# Patient Record
Sex: Male | Born: 1940
Health system: Southern US, Community
[De-identification: ages and names within clinical notes are randomized; demographics above are authoritative.]

## PROBLEM LIST (undated history)

## (undated) DIAGNOSIS — I10 Essential (primary) hypertension: Secondary | ICD-10-CM

## (undated) DIAGNOSIS — K4090 Unilateral inguinal hernia, without obstruction or gangrene, not specified as recurrent: Secondary | ICD-10-CM

## (undated) DIAGNOSIS — C801 Malignant (primary) neoplasm, unspecified: Secondary | ICD-10-CM

## (undated) DIAGNOSIS — F419 Anxiety disorder, unspecified: Secondary | ICD-10-CM

## (undated) DIAGNOSIS — Z8619 Personal history of other infectious and parasitic diseases: Secondary | ICD-10-CM

## (undated) DIAGNOSIS — E785 Hyperlipidemia, unspecified: Secondary | ICD-10-CM

## (undated) HISTORY — DX: Hyperlipidemia, unspecified: E78.5

## (undated) HISTORY — PX: TONSILLECTOMY: SHX5217

## (undated) HISTORY — PX: CHOLECYSTECTOMY: SHX55

## (undated) HISTORY — PX: TONSILLECTOMY: SUR1361

## (undated) HISTORY — DX: Personal history of other infectious and parasitic diseases: Z86.19

## (undated) HISTORY — PX: CATARACT EXTRACTION, BILATERAL: SHX1313

## (undated) HISTORY — DX: Essential (primary) hypertension: I10

---

## 2010-10-03 ENCOUNTER — Ambulatory Visit (INDEPENDENT_AMBULATORY_CARE_PROVIDER_SITE_OTHER): Payer: Medicare Other | Admitting: Internal Medicine

## 2010-10-03 ENCOUNTER — Encounter: Payer: Self-pay | Admitting: Internal Medicine

## 2010-10-03 DIAGNOSIS — I10 Essential (primary) hypertension: Secondary | ICD-10-CM

## 2010-10-03 DIAGNOSIS — E7800 Pure hypercholesterolemia, unspecified: Secondary | ICD-10-CM

## 2010-10-03 MED ORDER — ALPRAZOLAM 0.5 MG PO TABS: 0.5000 mg | ORAL_TABLET | Freq: Every evening | ORAL | Status: DC | PRN

## 2010-10-03 MED ORDER — SILDENAFIL CITRATE 100 MG PO TABS: 100.0000 mg | ORAL_TABLET | Freq: Every day | ORAL | Status: DC | PRN

## 2010-10-03 MED ORDER — LISINOPRIL 20 MG PO TABS: 20.0000 mg | ORAL_TABLET | Freq: Every day | ORAL | Status: DC

## 2010-10-03 MED ORDER — SIMVASTATIN 20 MG PO TABS: 20.0000 mg | ORAL_TABLET | Freq: Every evening | ORAL | Status: DC

## 2010-10-03 MED ORDER — AMOXICILLIN-POT CLAVULANATE 875-125 MG PO TABS: 1.0000 | ORAL_TABLET | Freq: Two times a day (BID) | ORAL | Status: DC

## 2010-10-03 MED ORDER — ATENOLOL 50 MG PO TABS: 50.0000 mg | ORAL_TABLET | Freq: Every day | ORAL | Status: DC

## 2010-10-03 NOTE — Progress Notes (Signed)
Subjective:    Patient ID: Randall Mcgee, male    DOB: 14-May-1941, 70 y.o.   MRN: [id number]  HPI   70 year old patient who is seen today to establish with our practice. He has relocated from the area and his wife is a patient here he is retired from [employer]. He has an approximate twenty-year history of hypertension and has been on Lipitor for approximately one and one half years. He was evaluated in the fall for hematuria with a normal urological workup. His PSA in August of last year was 1.7. No concerns or complaints today.   for approximately 2 months he has had a lesion involving his right anterior chest wall. This began after a trip to [place]. Maisie Fus. It has not responded to topical antibiotic creams   Review of Systems  Constitutional: Negative for fever, chills, activity change, appetite change and fatigue.  HENT: Negative for hearing loss, ear pain, congestion, rhinorrhea, sneezing, mouth sores, trouble swallowing, neck pain, neck stiffness, dental problem, voice change, sinus pressure and tinnitus.   Eyes: Negative for photophobia, pain, redness and visual disturbance.  Respiratory: Negative for apnea, cough, choking, chest tightness, shortness of breath and wheezing.   Cardiovascular: Negative for chest pain, palpitations and leg swelling.  Gastrointestinal: Negative for nausea, vomiting, abdominal pain, diarrhea, constipation, blood in stool, abdominal distention, anal bleeding and rectal pain.  Genitourinary: Negative for dysuria, urgency, frequency, hematuria, flank pain, decreased urine volume, discharge, penile swelling, scrotal swelling, difficulty urinating, genital sores and testicular pain.  Musculoskeletal: Negative for myalgias, back pain, joint swelling, arthralgias and gait problem.  Skin: Positive for wound. Negative for color change and rash.  Neurological: Negative for dizziness, tremors, seizures, syncope, facial asymmetry, speech difficulty, weakness, light-headedness,  numbness and headaches.  Hematological: Negative for adenopathy. Does not bruise/bleed easily.  Psychiatric/Behavioral: Negative for suicidal ideas, hallucinations, behavioral problems, confusion, sleep disturbance, self-injury, dysphoric mood, decreased concentration and agitation. The patient is not nervous/anxious.        Objective:   Physical Exam  Constitutional: He appears well-developed and well-nourished.  HENT:  Head: Normocephalic and atraumatic.  Right Ear: External ear normal.  Left Ear: External ear normal.  Nose: Nose normal.  Mouth/Throat: Oropharynx is clear and moist.  Eyes: Conjunctivae and EOM are normal. Pupils are equal, round, and reactive to light. No scleral icterus.  Neck: Normal range of motion. Neck supple. No JVD present. No thyromegaly present.  Cardiovascular: Regular rhythm, normal heart sounds and intact distal pulses.  Exam reveals no gallop and no friction rub.   No murmur heard. Pulmonary/Chest: Effort normal and breath sounds normal. He exhibits no tenderness.  Abdominal: Soft. Bowel sounds are normal. He exhibits no distension and no mass. There is no tenderness.  Genitourinary: Penis normal.        uncircumcised  Musculoskeletal: Normal range of motion. He exhibits no edema and no tenderness.  Lymphadenopathy:    He has no cervical adenopathy.  Neurological: He is alert. He has normal reflexes. No cranial nerve deficit. Coordination normal.  Skin: Skin is warm and dry. Rash noted.        Patient has an approximate 2 cm circular raised plaque with central ulceration.  Psychiatric: He has a normal mood and affect. His behavior is normal.          Assessment & Plan:   unremarkable health maintenance examination  ulcerated lesion right anterior chest. We'll treat with Augmentin and reassess in 3 weeks. If unimproved will refer to dermatology or possible  general surgery to exclude a basal or squamous cell skin cancer. Short duration and exposure  history, suggest this is infectious

## 2010-10-03 NOTE — Patient Instructions (Signed)
Return in one month for follow-up  Call or return to clinic prn if these symptoms worsen or fail to improve as anticipated.    It is important that you exercise regularly, at least 20 minutes 3 to 4 times per week.  If you develop chest pain or shortness of breath seek  medical attention.  Limit your sodium (Salt) intake

## 2010-10-10 ENCOUNTER — Other Ambulatory Visit: Payer: Self-pay | Admitting: Internal Medicine

## 2010-10-10 NOTE — Telephone Encounter (Signed)
Pt called this AM requesting 2nd round of ABX - feel he is improving , but not quite over sx.   We have rev'c request from pharmacy for the same

## 2010-10-10 NOTE — Telephone Encounter (Signed)
Attempted to call pt to let him know rx has been sent to pharmacy. Wrong? Number in chart. KIK

## 2010-10-10 NOTE — Telephone Encounter (Signed)
Okay to refill #20 to take twice a day with meals

## 2010-10-25 ENCOUNTER — Encounter: Payer: Self-pay | Admitting: Internal Medicine

## 2010-10-27 ENCOUNTER — Encounter: Payer: Self-pay | Admitting: Internal Medicine

## 2010-11-07 ENCOUNTER — Encounter: Payer: Self-pay | Admitting: Internal Medicine

## 2010-11-07 ENCOUNTER — Ambulatory Visit (INDEPENDENT_AMBULATORY_CARE_PROVIDER_SITE_OTHER): Payer: Medicare Other | Admitting: Internal Medicine

## 2010-11-07 DIAGNOSIS — B999 Unspecified infectious disease: Secondary | ICD-10-CM

## 2010-11-07 DIAGNOSIS — E7800 Pure hypercholesterolemia, unspecified: Secondary | ICD-10-CM

## 2010-11-07 DIAGNOSIS — L089 Local infection of the skin and subcutaneous tissue, unspecified: Secondary | ICD-10-CM

## 2010-11-07 DIAGNOSIS — I10 Essential (primary) hypertension: Secondary | ICD-10-CM

## 2010-11-07 NOTE — Patient Instructions (Signed)
Limit your sodium (Salt) intake    It is important that you exercise regularly, at least 20 minutes 3 to 4 times per week.  If you develop chest pain or shortness of breath seek  medical attention.  Please check your blood pressure on a regular basis.  If it is consistently greater than 150/90, please make an office appointment.  Return in 6 months for follow-up   

## 2010-11-07 NOTE — Progress Notes (Signed)
  Subjective:    Patient ID: Randall Mcgee, male    DOB: 04/23/1941, 70 y.o.   MRN: [id number]  HPI  70 year old patient who is seen today for followup. He established last month with hypertension and dyslipidemia. He also had an ulcerative lesion involving his right anterior chest that was felt to be inflammatory. He was placed on antibiotic therapy and is seen today in followup. The lesion has responded nicely to antibiotic therapy   Review of Systems  Skin: Positive for wound.       Objective:   Physical Exam  Constitutional: He appears well-developed and well-nourished. No distress.       122/82  Skin:       Inflammatory ulcerative nodule has largely resolved          Assessment & Plan:  Soft tissue infection largely resolved Hypertension stable  We'll recheck in 6 months

## 2011-01-03 ENCOUNTER — Other Ambulatory Visit: Payer: Self-pay

## 2011-01-03 MED ORDER — INDOMETHACIN 25 MG PO CAPS: 25.0000 mg | ORAL_CAPSULE | Freq: Three times a day (TID) | ORAL | Status: DC | PRN

## 2011-01-03 NOTE — Telephone Encounter (Signed)
Faxed to [facility]

## 2011-01-05 ENCOUNTER — Other Ambulatory Visit: Payer: Self-pay

## 2011-01-05 MED ORDER — ALPRAZOLAM 0.5 MG PO TABS: 0.5000 mg | ORAL_TABLET | Freq: Every evening | ORAL | Status: DC | PRN

## 2011-01-05 NOTE — Telephone Encounter (Signed)
Faxed back to [facility]

## 2011-04-11 ENCOUNTER — Other Ambulatory Visit: Payer: Self-pay

## 2011-04-11 MED ORDER — ALPRAZOLAM 0.5 MG PO TABS: 0.5000 mg | ORAL_TABLET | Freq: Every evening | ORAL | Status: DC | PRN

## 2011-04-11 NOTE — Telephone Encounter (Signed)
Faxed back to [facility]

## 2011-05-02 ENCOUNTER — Other Ambulatory Visit (INDEPENDENT_AMBULATORY_CARE_PROVIDER_SITE_OTHER): Payer: Medicare Other

## 2011-05-02 DIAGNOSIS — I10 Essential (primary) hypertension: Secondary | ICD-10-CM

## 2011-05-02 DIAGNOSIS — E7800 Pure hypercholesterolemia, unspecified: Secondary | ICD-10-CM

## 2011-05-02 DIAGNOSIS — T887XXA Unspecified adverse effect of drug or medicament, initial encounter: Secondary | ICD-10-CM

## 2011-05-02 DIAGNOSIS — Z0000 Encounter for general adult medical examination without abnormal findings: Secondary | ICD-10-CM

## 2011-05-02 DIAGNOSIS — Z125 Encounter for screening for malignant neoplasm of prostate: Secondary | ICD-10-CM

## 2011-05-02 LAB — CBC WITH DIFFERENTIAL/PLATELET
Basophils Absolute: 0 10*3/uL (ref 0.0–0.1)
Eosinophils Absolute: 0.1 10*3/uL (ref 0.0–0.7)
Hemoglobin: 13.7 g/dL (ref 13.0–17.0)
Lymphocytes Relative: 38.8 % (ref 12.0–46.0)
MCHC: 33.2 g/dL (ref 30.0–36.0)
Neutro Abs: 3.3 10*3/uL (ref 1.4–7.7)
Neutrophils Relative %: 48.3 % (ref 43.0–77.0)
Platelets: 199 10*3/uL (ref 150.0–400.0)
RDW: 13.6 % (ref 11.5–14.6)

## 2011-05-02 LAB — POCT URINALYSIS DIPSTICK
Ketones, UA: NEGATIVE
Leukocytes, UA: NEGATIVE
Protein, UA: NEGATIVE
Spec Grav, UA: 1.02
pH, UA: 5

## 2011-05-02 LAB — BASIC METABOLIC PANEL
BUN: 19 mg/dL (ref 6–23)
CO2: 29 mEq/L (ref 19–32)
Calcium: 9.4 mg/dL (ref 8.4–10.5)
Creatinine, Ser: 0.9 mg/dL (ref 0.4–1.5)
GFR: 92.75 mL/min (ref >60.00)
Glucose, Bld: 102 mg/dL — ABNORMAL HIGH (ref 70–99)

## 2011-05-02 LAB — TSH: TSH: 3.54 u[IU]/mL (ref 0.35–5.50)

## 2011-05-02 LAB — LIPID PANEL
HDL: 47.4 mg/dL (ref >39.00)
LDL Cholesterol: 133 mg/dL — ABNORMAL HIGH (ref 0–99)
VLDL: 15.2 mg/dL (ref 0.0–40.0)

## 2011-05-02 LAB — PSA: PSA: 1.95 ng/mL (ref 0.10–4.00)

## 2011-05-02 LAB — HEPATIC FUNCTION PANEL
Albumin: 4.2 g/dL (ref 3.5–5.2)
Alkaline Phosphatase: 38 U/L — ABNORMAL LOW (ref 39–117)
Total Bilirubin: 0.7 mg/dL (ref 0.3–1.2)

## 2011-05-09 ENCOUNTER — Encounter: Payer: Self-pay | Admitting: Internal Medicine

## 2011-05-09 ENCOUNTER — Ambulatory Visit (INDEPENDENT_AMBULATORY_CARE_PROVIDER_SITE_OTHER): Payer: Medicare Other | Admitting: Internal Medicine

## 2011-05-09 DIAGNOSIS — I10 Essential (primary) hypertension: Secondary | ICD-10-CM

## 2011-05-09 DIAGNOSIS — Z23 Encounter for immunization: Secondary | ICD-10-CM

## 2011-05-09 DIAGNOSIS — D239 Other benign neoplasm of skin, unspecified: Secondary | ICD-10-CM

## 2011-05-09 DIAGNOSIS — R0989 Other specified symptoms and signs involving the circulatory and respiratory systems: Secondary | ICD-10-CM

## 2011-05-09 DIAGNOSIS — Z0000 Encounter for general adult medical examination without abnormal findings: Secondary | ICD-10-CM

## 2011-05-09 MED ORDER — SIMVASTATIN 40 MG PO TABS: 40.0000 mg | ORAL_TABLET | Freq: Every evening | ORAL | Status: DC

## 2011-05-09 NOTE — Progress Notes (Signed)
Subjective:    Patient ID: Randall Mcgee, male    DOB: Nov 28, 1940, 70 y.o.   MRN: [id number]  HPI  70 year old patient who is seen today for a preventive health examination. He established with our practice earlier this year. Medical problems include hypertension and dyslipidemia. He is doing quite well without concerns or complaints  Past Medical History  Diagnosis Date  . Hyperlipidemia   . Hypertension   . History of chicken pox    Past Surgical History  Procedure Date  . Cholecystectomy   . Tonsillectomy     reports that he has never smoked. He has never used smokeless tobacco. He reports that he does not drink alcohol or use illicit drugs. family history is not on file. No Known Allergies  1. Risk factors, based on past  M,S,F history-  cardiovascular risk factors include hypertension and dyslipidemia  2.  Physical activities: Remains active with gardening and housework although no regular exercise program. No exercise limitation  3.  Depression/mood: History mild anxiety but no history of severe depression or mood disorder  4.  Hearing: No deficits  5.  ADL's: Independent in all aspects of daily living  6.  Fall risk: Low  7.  Home safety: No problems identified  8.  Height weight, and visual acuity; height and weight stable no change in visual acuity 9.  Counseling: More regular exercise heart healthy diet encouraged  10. Lab orders based on risk factors: Laboratory studies reviewed we'll titrate statin therapy  11. Referral  dermatology referral. Maryclare Labrador also set up carotid artery Doppler studies :  12. Care plan: Carotid artery Doppler study rule out significant carotid artery disease 13. Cognitive assessment: Alert and oriented normal affect. No cognitive dysfunction.      Review of Systems  Constitutional: Negative for fever, chills, activity change, appetite change and fatigue.  HENT: Negative for hearing loss, ear pain, congestion, rhinorrhea, sneezing,  mouth sores, trouble swallowing, neck pain, neck stiffness, dental problem, voice change, sinus pressure and tinnitus.   Eyes: Negative for photophobia, pain, redness and visual disturbance.  Respiratory: Negative for apnea, cough, choking, chest tightness, shortness of breath and wheezing.   Cardiovascular: Negative for chest pain, palpitations and leg swelling.  Gastrointestinal: Negative for nausea, vomiting, abdominal pain, diarrhea, constipation, blood in stool, abdominal distention, anal bleeding and rectal pain.  Genitourinary: Negative for dysuria, urgency, frequency, hematuria, flank pain, decreased urine volume, discharge, penile swelling, scrotal swelling, difficulty urinating, genital sores and testicular pain.  Musculoskeletal: Negative for myalgias, back pain, joint swelling, arthralgias and gait problem.  Skin: Negative for color change, rash and wound.  Neurological: Negative for dizziness, tremors, seizures, syncope, facial asymmetry, speech difficulty, weakness, light-headedness, numbness and headaches.  Hematological: Negative for adenopathy. Does not bruise/bleed easily.  Psychiatric/Behavioral: Negative for suicidal ideas, hallucinations, behavioral problems, confusion, sleep disturbance, self-injury, dysphoric mood, decreased concentration and agitation. The patient is not nervous/anxious.        Objective:   Physical Exam  Constitutional: He appears well-developed and well-nourished.  HENT:  Head: Normocephalic and atraumatic.  Right Ear: External ear normal.  Left Ear: External ear normal.  Nose: Nose normal.  Mouth/Throat: Oropharynx is clear and moist.  Eyes: Conjunctivae and EOM are normal. Pupils are equal, round, and reactive to light. No scleral icterus.  Neck: Normal range of motion. Neck supple. No JVD present. No thyromegaly present.       Bilateral carotid bruits right greater than the left  Cardiovascular: Regular rhythm, normal heart sounds  and intact  distal pulses.  Exam reveals no gallop and no friction rub.   No murmur heard. Pulmonary/Chest: Effort normal and breath sounds normal. He exhibits no tenderness.  Abdominal: Soft. Bowel sounds are normal. He exhibits no distension and no mass. There is no tenderness.  Genitourinary: Prostate normal and penis normal.  Musculoskeletal: Normal range of motion. He exhibits no edema and no tenderness.  Lymphadenopathy:    He has no cervical adenopathy.  Neurological: He is alert. He has normal reflexes. No cranial nerve deficit. Coordination normal.  Skin: Skin is warm and dry. No rash noted.       A. pigmented nodule 5 x 7 mm was present involving his right upper back region. A slightly smaller 4 x 5 mm pigmented macule also noted in the same vicinity  Psychiatric: He has a normal mood and affect. His behavior is normal.          Assessment & Plan:   Hypertension. Well controlled we'll continue his present regimen Preventive health Dyslipidemia Will increase simvastatin to 40 mg daily Dysplastic nevus we'll set up for dermatology evaluation Carotid bruits. The carotid artery Doppler study

## 2011-05-09 NOTE — Progress Notes (Signed)
  Subjective:    Patient ID: Randall Mcgee, male    DOB: 04-17-1941, 70 y.o.   MRN: [id number]  HPI  Past Medical History  Diagnosis Date  . Hyperlipidemia   . Hypertension   . History of chicken pox    Past Surgical History  Procedure Date  . Cholecystectomy   . Tonsillectomy     reports that he has never smoked. He has never used smokeless tobacco. He reports that he uses illicit drugs (Other-see comments). His alcohol history not on file. family history is not on file. No Known Allergies     Review of Systems     Objective:   Physical Exam        Assessment & Plan:

## 2011-05-09 NOTE — Patient Instructions (Signed)
Limit your sodium (Salt) intake    It is important that you exercise regularly, at least 20 minutes 3 to 4 times per week.  If you develop chest pain or shortness of breath seek  medical attention.  Return in 6 months for follow-up  

## 2011-05-22 ENCOUNTER — Encounter (INDEPENDENT_AMBULATORY_CARE_PROVIDER_SITE_OTHER): Payer: Medicare Other | Admitting: *Deleted

## 2011-05-22 ENCOUNTER — Other Ambulatory Visit: Payer: Self-pay | Admitting: Internal Medicine

## 2011-05-22 DIAGNOSIS — I6529 Occlusion and stenosis of unspecified carotid artery: Secondary | ICD-10-CM

## 2011-05-22 DIAGNOSIS — R0989 Other specified symptoms and signs involving the circulatory and respiratory systems: Secondary | ICD-10-CM

## 2011-05-24 ENCOUNTER — Encounter: Payer: Self-pay | Admitting: Vascular Surgery

## 2011-05-25 ENCOUNTER — Other Ambulatory Visit (INDEPENDENT_AMBULATORY_CARE_PROVIDER_SITE_OTHER): Payer: Medicare Other | Admitting: *Deleted

## 2011-05-25 ENCOUNTER — Ambulatory Visit (INDEPENDENT_AMBULATORY_CARE_PROVIDER_SITE_OTHER): Payer: Medicare Other | Admitting: Vascular Surgery

## 2011-05-25 ENCOUNTER — Encounter: Payer: Self-pay | Admitting: Vascular Surgery

## 2011-05-25 VITALS — BP 176/92 | HR 58 | Ht 71.0 in | Wt 198.0 lb

## 2011-05-25 DIAGNOSIS — I6529 Occlusion and stenosis of unspecified carotid artery: Secondary | ICD-10-CM

## 2011-05-25 DIAGNOSIS — Z01818 Encounter for other preprocedural examination: Secondary | ICD-10-CM

## 2011-05-25 NOTE — Progress Notes (Signed)
History of Present Illness:  Patient is a 70 y.o. year old male who presents for evaluation of carotid stenosis.  The patient denies symptoms of TIA, amaurosis, or stroke.  The patient is currently on aspirin antiplatelet therapy.  The carotid stenosis was found on duplex evaluation.  Other medical problems include hyperlipidemia and hypertension.  He is a remote smoker.  He denies dyspnea, chest pain, or claudication.  His medical problems are currently stable and followed by Dr Amador Cunas.  Past Medical History  Diagnosis Date  . Hyperlipidemia   . Hypertension   . History of chicken pox     Past Surgical History  Procedure Date  . Cholecystectomy   . Tonsillectomy      Social History History  Substance Use Topics  . Smoking status: Never Smoker   . Smokeless tobacco: Never Used  . Alcohol Use: No    Family History Family History  Problem Relation Age of Onset  . Heart disease Mother     Allergies  No Known Allergies   Current Outpatient Prescriptions  Medication Sig Dispense Refill  . ALPRAZolam (XANAX) 0.5 MG tablet Take 1 tablet (0.5 mg total) by mouth at bedtime as needed.  30 tablet  2  . aspirin 325 MG tablet Take 325 mg by mouth daily.        Marland Kitchen atenolol (TENORMIN) 50 MG tablet Take 1 tablet (50 mg total) by mouth daily.  90 tablet  6  . GINSENG PO Take 450 mg by mouth daily.        . indomethacin (INDOCIN) 25 MG capsule Take 1 capsule (25 mg total) by mouth 3 (three) times daily as needed.  90 capsule  0  . lisinopril (PRINIVIL,ZESTRIL) 20 MG tablet Take 1 tablet (20 mg total) by mouth daily.  90 tablet  6  . Multiple Vitamins-Minerals (CENTRUM SILVER PO) Take by mouth daily.        . Omega-3 Fatty Acids (FISH OIL) 1200 MG CAPS Take by mouth daily.        . sildenafil (VIAGRA) 100 MG tablet Take 1 tablet (100 mg total) by mouth daily as needed.  10 tablet  6  . simvastatin (ZOCOR) 40 MG tablet Take 1 tablet (40 mg total) by mouth every evening.  90 tablet  6    . St Johns Wort 300 MG CAPS Take by mouth daily.          ROS:   General:  No weight loss, Fever, chills  HEENT: No recent headaches, no nasal bleeding, no visual changes, no sore throat  Neurologic: No dizziness, blackouts, seizures. No recent symptoms of stroke or mini- stroke. No recent episodes of slurred speech, or temporary blindness.  Cardiac: No recent episodes of chest pain/pressure, no shortness of breath at rest.  No shortness of breath with exertion.  Denies history of atrial fibrillation or irregular heartbeat  Vascular: No history of rest pain in feet.  No history of claudication.  No history of non-healing ulcer, No history of DVT   Pulmonary: No home oxygen, no productive cough, no hemoptysis,  No asthma or wheezing  Musculoskeletal:  [ ]  Arthritis, [ ]  Low back pain,  [ ]  Joint pain  Hematologic:No history of hypercoagulable state.  No history of easy bleeding.  No history of anemia  Gastrointestinal: No hematochezia or melena,  No gastroesophageal reflux, no trouble swallowing  Urinary: [ ]  chronic Kidney disease, [ ]  on HD - [ ]  MWF or [ ]  TTHS, [ ]   Burning with urination, [ ]  Frequent urination, [ ]  Difficulty urinating;   Skin: No rashes  Psychological: No history of anxiety,  No history of depression   Physical Examination  Filed Vitals:   05/25/11 0940 05/25/11 0941  BP: 173/100 176/92  Pulse: 58 58  Height: 5\' 11"  (1.803 m)   Weight: 198 lb (89.812 kg)   SpO2: 98%     Body mass index is 27.62 kg/(m^2).  General:  Alert and oriented, no acute distress HEENT: Normal Neck: No bruit or JVD Pulmonary: Clear to auscultation bilaterally Cardiac: Regular Rate and Rhythm without murmur Gastrointestinal: Soft, non-tender, non-distended, no mass, no scars Skin: No rash Extremity Pulses:  2+ radial, brachial, femoral, dorsalis pedis, posterior tibial pulses bilaterally Musculoskeletal: No deformity or edema  Neurologic: Upper and lower extremity motor  5/5 and symmetric  DATA: Carotid Duplex from [facility] was reviewed which suggested a high grade subtotal occlusion right ICA.  The right side was repeated today to make sure this was not an occlusion and also to determine distal extent of the plaque. I reviewed and interpreted his carotid duplex scan from our office today which shows a greater than 80% right internal carotid artery stenosis with peak systolic velocity of 430 cm/s and end-diastolic velocity of 142 cm/s, there was normal vessel beyond the stenosis   ASSESSMENT: High-grade right internal carotid artery stenosis, asymptomatic. Risks benefits possible complications and procedure details of right carotid endarterectomy were explained to the patient and his wife today. These were including but not limited to bleeding infection stroke risk of 1-2%, cranial nerve injury 10-15%, myocardial infarction 5% or less.   PLAN: We will schedule the patient for cardiac risk stratification a stress test at [facility] cardiology as soon as possible.  We will proceed with right carotid endarterectomy after the patient stress test.  Fabienne Bruns, MD [facility] Office: [phone] Pager: [phone]

## 2011-05-31 ENCOUNTER — Ambulatory Visit (HOSPITAL_COMMUNITY): Payer: Medicare Other | Attending: Vascular Surgery | Admitting: Radiology

## 2011-05-31 DIAGNOSIS — R002 Palpitations: Secondary | ICD-10-CM | POA: Insufficient documentation

## 2011-05-31 DIAGNOSIS — I6529 Occlusion and stenosis of unspecified carotid artery: Secondary | ICD-10-CM

## 2011-05-31 DIAGNOSIS — Z01818 Encounter for other preprocedural examination: Secondary | ICD-10-CM

## 2011-05-31 DIAGNOSIS — I779 Disorder of arteries and arterioles, unspecified: Secondary | ICD-10-CM | POA: Insufficient documentation

## 2011-05-31 DIAGNOSIS — Z01810 Encounter for preprocedural cardiovascular examination: Secondary | ICD-10-CM | POA: Insufficient documentation

## 2011-05-31 DIAGNOSIS — Z8249 Family history of ischemic heart disease and other diseases of the circulatory system: Secondary | ICD-10-CM | POA: Insufficient documentation

## 2011-05-31 DIAGNOSIS — I10 Essential (primary) hypertension: Secondary | ICD-10-CM | POA: Insufficient documentation

## 2011-05-31 DIAGNOSIS — E7800 Pure hypercholesterolemia, unspecified: Secondary | ICD-10-CM

## 2011-05-31 DIAGNOSIS — Z87891 Personal history of nicotine dependence: Secondary | ICD-10-CM | POA: Insufficient documentation

## 2011-05-31 DIAGNOSIS — E785 Hyperlipidemia, unspecified: Secondary | ICD-10-CM | POA: Insufficient documentation

## 2011-05-31 DIAGNOSIS — I4949 Other premature depolarization: Secondary | ICD-10-CM

## 2011-05-31 MED ORDER — REGADENOSON 0.4 MG/5ML IV SOLN
0.4000 mg | Freq: Once | INTRAVENOUS | Status: AC
  Administered 2011-05-31: 0.4 mg via INTRAVENOUS

## 2011-05-31 MED ORDER — TECHNETIUM TC 99M TETROFOSMIN IV KIT
11.0000 | PACK | Freq: Once | INTRAVENOUS | Status: AC | PRN
  Administered 2011-05-31: 11 via INTRAVENOUS

## 2011-05-31 MED ORDER — TECHNETIUM TC 99M TETROFOSMIN IV KIT
33.0000 | PACK | Freq: Once | INTRAVENOUS | Status: AC | PRN
  Administered 2011-05-31: 33 via INTRAVENOUS

## 2011-05-31 NOTE — Progress Notes (Signed)
[facility] SITE 3 NUCLEAR MED [address] [place] [address] [phone]  Cardiology Nuclear Med Study  Randall Mcgee is a 70 y.o. male [id number] 03-24-1941   Nuclear Med Background Indication for Stress Test:  Evaluation for Ischemia and Surgical Clearance Pre-op R CEA, Dr. Darrick Penna History:  GXT: done in [place] >51yrs ago Cardiac Risk Factors: Carotid Disease (L- 40-59%,  R->80%), Family History - CAD, History of Smoking, Hypertension and Lipids  Symptoms:  Palpitations   Nuclear Pre-Procedure Caffeine/Decaff Intake:  None NPO After: 7:30am   Lungs:  clear IV 0.9% NS with Angio Cath:  20g  IV Site: R Hand  IV Started by:  Bonnita Levan, RN  Chest Size (in):  44 Cup Size: n/a  Height: 5\' 11"  (1.803 m)  Weight:  199 lb (90.266 kg)  BMI:  Body mass index is 27.75 kg/(m^2). Tech Comments:  No attenolol this am    Nuclear Med Study 1 or 2 day study: 1 day  Stress Test Type:  Lexiscan  Reading MD: Charlton Haws, MD  Order Authorizing Provider:  Dr. Fabienne Bruns  Resting Radionuclide: Technetium 64m Tetrofosmin  Resting Radionuclide Dose: 11.0 mCi   Stress Radionuclide:  Technetium 60m Tetrofosmin  Stress Radionuclide Dose: 33.0 mCi           Stress Protocol Rest HR: 60 Stress HR: 80  Rest BP: 144/90 Stress BP: 145/93  Exercise Time (min): n/a METS: n/a   Predicted Max HR: 148 bpm % Max HR: 54.05 bpm Rate Pressure Product: [id number]   Dose of Adenosine (mg):  n/a Dose of Lexiscan: 0.4 mg  Dose of Atropine (mg): n/a Dose of Dobutamine: n/a mcg/kg/min (at max HR)  Stress Test Technologist: Milana Na, EMT-P  Nuclear Technologist:  Domenic Polite, CNMT     Rest Procedure:  Myocardial perfusion imaging was performed at rest 45 minutes following the intravenous administration of Technetium 62m Tetrofosmin. Rest ECG: NSR  Stress Procedure:  The patient received IV Lexiscan 0.4 mg over 15-seconds.  Technetium 46m Tetrofosmin injected at 30-seconds.   There were no significant changes, + Lt. Headed, and rare pvcs with Lexiscan.  Quantitative spect images were obtained after a 45 minute delay. Stress ECG: No significant change from baseline ECG  QPS Raw Data Images:  Normal; no motion artifact; normal heart/lung ratio. Stress Images:  Normal homogeneous uptake in all areas of the myocardium. Rest Images:  Normal homogeneous uptake in all areas of the myocardium. Subtraction (SDS):  Normal Transient Ischemic Dilatation (Normal <1.22):  0.98 Lung/Heart Ratio (Normal <0.45):  0.27  Quantitative Gated Spect Images QGS EDV:  103 ml QGS ESV:  40 ml QGS cine images:  NL LV Function; NL Wall Motion QGS EF: 61%  Impression Exercise Capacity:  Lexiscan with no exercise. BP Response:  Normal blood pressure response. Clinical Symptoms:  No chest pain. ECG Impression:  No significant ST segment change suggestive of ischemia. Comparison with Prior Nuclear Study: No images to compare  Overall Impression:  Normal stress nuclear study.     Charlton Haws

## 2011-06-05 ENCOUNTER — Telehealth: Payer: Self-pay | Admitting: Internal Medicine

## 2011-06-05 NOTE — Telephone Encounter (Signed)
Pt. Is wanting advice on an up coming surgery with Dr. Darrick Penna on his carotid artery and is requesting to be contacted.

## 2011-06-05 NOTE — Telephone Encounter (Signed)
Called and discussd

## 2011-06-06 ENCOUNTER — Other Ambulatory Visit: Payer: Self-pay

## 2011-06-13 ENCOUNTER — Encounter (HOSPITAL_COMMUNITY)
Admission: RE | Admit: 2011-06-13 | Discharge: 2011-06-13 | Disposition: A | Discharge status: Home / Self Care | Payer: Medicare Other | Source: Ambulatory Visit | Attending: Vascular Surgery | Admitting: Vascular Surgery

## 2011-06-13 ENCOUNTER — Encounter (HOSPITAL_COMMUNITY): Payer: Self-pay

## 2011-06-13 ENCOUNTER — Other Ambulatory Visit: Payer: Self-pay

## 2011-06-13 LAB — URINALYSIS, ROUTINE W REFLEX MICROSCOPIC
Bilirubin Urine: NEGATIVE
Hgb urine dipstick: NEGATIVE
Nitrite: NEGATIVE
Specific Gravity, Urine: 1.01 (ref 1.005–1.030)
Urobilinogen, UA: 1 mg/dL (ref 0.0–1.0)
pH: 6.5 (ref 5.0–8.0)

## 2011-06-13 LAB — BASIC METABOLIC PANEL
BUN: 13 mg/dL (ref 6–23)
CO2: 27 mEq/L (ref 19–32)
Chloride: 108 mEq/L (ref 96–112)
Glucose, Bld: 93 mg/dL (ref 70–99)
Potassium: 4.5 mEq/L (ref 3.5–5.1)
Sodium: 143 mEq/L (ref 135–145)

## 2011-06-13 LAB — CBC
HCT: 40.1 % (ref 39.0–52.0)
Hemoglobin: 13.6 g/dL (ref 13.0–17.0)
MCH: 32.5 pg (ref 26.0–34.0)
MCHC: 33.9 g/dL (ref 30.0–36.0)
RBC: 4.18 MIL/uL — ABNORMAL LOW (ref 4.22–5.81)

## 2011-06-13 NOTE — Pre-Procedure Instructions (Signed)
[name]  06/13/2011   Your procedure is scheduled on:  November 27TH, Tuesday                                                    Report to [facility] at  5:30AM.  Call this number if you have problems the morning of surgery: [phone]   Remember:   Do not eat food:After Midnight.  Do not drink clear liquids: 4 Hours before arrival.  UNTIL 1:30AM             TEA, BLACK COFFEE, APPLE OR GRAPE JUICE, SODA OR BROTH .  Take these medicines the morning of surgery with A SIP OF WATER: ATENOLOL              XANAX (IF NEEDED)   Do not wear jewelry, make-up or nail polish.   Do not wear lotions, powders, or perfumes. You may wear deodorant.   Do not shave 48 hours prior to surgery.   Do not bring valuables to the hospital .  Contacts, dentures or bridgework may not be worn into surgery.  Leave suitcase in the car. After surgery it may be brought to your room.  For patients admitted to the hospital, checkout time is 11:00 AM the day of discharge.   Patients discharged the day of surgery will not be allowed to drive home.  Name and phone number of your driver:  [facility]   [phone]  OR  (C) [phone]  Special Instructions: CHG Shower Use Special Wash: 1/2 bottle night before surgery and 1/2 bottle morning of surgery.   Please read over the following fact sheets that you were given: Pain Booklet, Blood Transfusion Information, MRSA Information and Surgical Site Infection Prevention

## 2011-06-13 NOTE — Progress Notes (Signed)
Recovering alcoholic x 16 yrs

## 2011-06-13 NOTE — Progress Notes (Signed)
PATIENT DID NOT WANT TO WEAR BLOOD BAND DURING THE HOLIDAYS....UNDERSTANDS HE WILL HAVE SAMPLE DRAWN THE DOS.

## 2011-06-19 MED ORDER — DEXTROSE 5 % IV SOLN
1.5000 g | Freq: Once | INTRAVENOUS | Status: AC
  Administered 2011-06-20: 1.5 g via INTRAVENOUS
  Filled 2011-06-19: qty 1.5

## 2011-06-20 ENCOUNTER — Encounter (HOSPITAL_COMMUNITY): Payer: Self-pay | Admitting: Anesthesiology

## 2011-06-20 ENCOUNTER — Encounter (HOSPITAL_COMMUNITY)
Admission: RE | Disposition: A | Discharge status: Home / Self Care | Payer: Self-pay | Source: Ambulatory Visit | Attending: Vascular Surgery

## 2011-06-20 ENCOUNTER — Other Ambulatory Visit: Payer: Self-pay | Admitting: Vascular Surgery

## 2011-06-20 ENCOUNTER — Inpatient Hospital Stay (HOSPITAL_COMMUNITY)
Admission: RE | Admit: 2011-06-20 | Discharge: 2011-06-21 | DRG: 038 | Disposition: A | Discharge status: Home / Self Care | Payer: Medicare Other | Source: Ambulatory Visit | Attending: Vascular Surgery | Admitting: Vascular Surgery

## 2011-06-20 ENCOUNTER — Encounter (HOSPITAL_COMMUNITY): Payer: Self-pay | Admitting: Emergency Medicine

## 2011-06-20 ENCOUNTER — Inpatient Hospital Stay (HOSPITAL_COMMUNITY): Payer: Medicare Other | Admitting: Anesthesiology

## 2011-06-20 DIAGNOSIS — I6529 Occlusion and stenosis of unspecified carotid artery: Secondary | ICD-10-CM

## 2011-06-20 DIAGNOSIS — Z01818 Encounter for other preprocedural examination: Secondary | ICD-10-CM

## 2011-06-20 DIAGNOSIS — E785 Hyperlipidemia, unspecified: Secondary | ICD-10-CM | POA: Diagnosis present

## 2011-06-20 DIAGNOSIS — D62 Acute posthemorrhagic anemia: Secondary | ICD-10-CM | POA: Diagnosis not present

## 2011-06-20 DIAGNOSIS — I10 Essential (primary) hypertension: Secondary | ICD-10-CM | POA: Diagnosis present

## 2011-06-20 HISTORY — PX: ENDARTERECTOMY: SHX5162

## 2011-06-20 LAB — ABO/RH: ABO/RH(D): O POS

## 2011-06-20 SURGERY — ENDARTERECTOMY, CAROTID
Anesthesia: General | Laterality: Right | Wound class: Clean

## 2011-06-20 MED ORDER — LABETALOL HCL 5 MG/ML IV SOLN: 10.0000 mg | INTRAVENOUS | Status: DC | PRN

## 2011-06-20 MED ORDER — GUAIFENESIN-DM 100-10 MG/5ML PO SYRP
15.0000 mL | ORAL_SOLUTION | ORAL | Status: DC | PRN
Start: 2011-06-20 — End: 2011-06-21

## 2011-06-20 MED ORDER — ONDANSETRON HCL 4 MG/2ML IJ SOLN: 4.0000 mg | Freq: Four times a day (QID) | INTRAMUSCULAR | Status: DC | PRN

## 2011-06-20 MED ORDER — NEOSTIGMINE METHYLSULFATE 1 MG/ML IJ SOLN
INTRAMUSCULAR | Status: DC | PRN
  Administered 2011-06-20: 4 mg via INTRAVENOUS

## 2011-06-20 MED ORDER — FENTANYL CITRATE 0.05 MG/ML IJ SOLN
INTRAMUSCULAR | Status: DC | PRN
  Administered 2011-06-20 (×5): 50 ug via INTRAVENOUS

## 2011-06-20 MED ORDER — ONDANSETRON HCL 4 MG/2ML IJ SOLN: 4.0000 mg | Freq: Once | INTRAMUSCULAR | Status: DC | PRN

## 2011-06-20 MED ORDER — SODIUM CHLORIDE 0.9 % IR SOLN
Status: DC | PRN
  Administered 2011-06-20: 10:00:00

## 2011-06-20 MED ORDER — OXYCODONE HCL 5 MG PO TABS: 5.0000 mg | ORAL_TABLET | ORAL | Status: AC | PRN

## 2011-06-20 MED ORDER — HETASTARCH-ELECTROLYTES 6 % IV SOLN
INTRAVENOUS | Status: DC | PRN
  Administered 2011-06-20: 09:00:00 via INTRAVENOUS

## 2011-06-20 MED ORDER — GLYCOPYRROLATE 0.2 MG/ML IJ SOLN
INTRAMUSCULAR | Status: DC | PRN
  Administered 2011-06-20: .6 mg via INTRAVENOUS
  Administered 2011-06-20 (×4): 0.1 mg via INTRAVENOUS

## 2011-06-20 MED ORDER — FAMOTIDINE IN NACL 20-0.9 MG/50ML-% IV SOLN
20.0000 mg | Freq: Two times a day (BID) | INTRAVENOUS | Status: DC
  Administered 2011-06-20 – 2011-06-21 (×2): 20 mg via INTRAVENOUS
  Filled 2011-06-20 (×2): qty 50

## 2011-06-20 MED ORDER — BISACODYL 10 MG RE SUPP: 10.0000 mg | Freq: Every day | RECTAL | Status: DC | PRN

## 2011-06-20 MED ORDER — SODIUM CHLORIDE 0.9 % IV SOLN: 500.0000 mL | Freq: Once | INTRAVENOUS | Status: AC | PRN

## 2011-06-20 MED ORDER — MORPHINE SULFATE 2 MG/ML IJ SOLN
INTRAMUSCULAR | Status: AC
  Administered 2011-06-20: 2 mg via INTRAVENOUS
  Filled 2011-06-20: qty 1

## 2011-06-20 MED ORDER — PROTAMINE SULFATE 10 MG/ML IV SOLN
INTRAVENOUS | Status: DC | PRN
  Administered 2011-06-20: 70 mg via INTRAVENOUS

## 2011-06-20 MED ORDER — PROPOFOL 10 MG/ML IV EMUL
INTRAVENOUS | Status: DC | PRN
  Administered 2011-06-20: 120 mg via INTRAVENOUS

## 2011-06-20 MED ORDER — LISINOPRIL 20 MG PO TABS
20.0000 mg | ORAL_TABLET | Freq: Every day | ORAL | Status: DC
  Administered 2011-06-21: 20 mg via ORAL
  Filled 2011-06-20 (×2): qty 1

## 2011-06-20 MED ORDER — SODIUM CHLORIDE 0.9 % IR SOLN
Status: DC | PRN
  Administered 2011-06-20: 1000 mL

## 2011-06-20 MED ORDER — ATENOLOL 50 MG PO TABS
50.0000 mg | ORAL_TABLET | Freq: Every day | ORAL | Status: DC
  Administered 2011-06-21: 50 mg via ORAL
  Filled 2011-06-20 (×2): qty 1

## 2011-06-20 MED ORDER — POTASSIUM CHLORIDE CRYS ER 20 MEQ PO TBCR: 20.0000 meq | EXTENDED_RELEASE_TABLET | Freq: Once | ORAL | Status: AC | PRN

## 2011-06-20 MED ORDER — DOPAMINE-DEXTROSE 3.2-5 MG/ML-% IV SOLN: 3.0000 ug/kg/min | INTRAVENOUS | Status: DC

## 2011-06-20 MED ORDER — OXYCODONE HCL 5 MG PO TABS
5.0000 mg | ORAL_TABLET | ORAL | Status: DC | PRN
  Administered 2011-06-20 – 2011-06-21 (×2): 5 mg via ORAL
  Filled 2011-06-20 (×2): qty 1

## 2011-06-20 MED ORDER — ROCURONIUM BROMIDE 100 MG/10ML IV SOLN
INTRAVENOUS | Status: DC | PRN
  Administered 2011-06-20: 50 mg via INTRAVENOUS
  Administered 2011-06-20: 10 mg via INTRAVENOUS

## 2011-06-20 MED ORDER — DEXTROSE 5 % IV SOLN
1.5000 g | Freq: Two times a day (BID) | INTRAVENOUS | Status: AC
  Administered 2011-06-20 – 2011-06-21 (×2): 1.5 g via INTRAVENOUS
  Filled 2011-06-20 (×2): qty 1.5

## 2011-06-20 MED ORDER — INDOMETHACIN 25 MG PO CAPS
25.0000 mg | ORAL_CAPSULE | Freq: Three times a day (TID) | ORAL | Status: DC | PRN
  Filled 2011-06-20: qty 1

## 2011-06-20 MED ORDER — DOCUSATE SODIUM 100 MG PO CAPS: 100.0000 mg | ORAL_CAPSULE | Freq: Every day | ORAL | Status: DC

## 2011-06-20 MED ORDER — ACETAMINOPHEN 325 MG PO TABS
325.0000 mg | ORAL_TABLET | ORAL | Status: DC | PRN
  Administered 2011-06-20: 650 mg via ORAL
  Administered 2011-06-21: 325 mg via ORAL
  Filled 2011-06-20: qty 2
  Filled 2011-06-20: qty 1

## 2011-06-20 MED ORDER — OMEGA-3-ACID ETHYL ESTERS 1 G PO CAPS
1.0000 g | ORAL_CAPSULE | Freq: Every day | ORAL | Status: DC
  Administered 2011-06-21: 1 g via ORAL
  Filled 2011-06-20 (×2): qty 1

## 2011-06-20 MED ORDER — ZOLPIDEM TARTRATE 5 MG PO TABS: 5.0000 mg | ORAL_TABLET | Freq: Every evening | ORAL | Status: DC | PRN

## 2011-06-20 MED ORDER — ASPIRIN EC 325 MG PO TBEC: 325.0000 mg | DELAYED_RELEASE_TABLET | Freq: Every day | ORAL | Status: DC

## 2011-06-20 MED ORDER — SIMVASTATIN 40 MG PO TABS
40.0000 mg | ORAL_TABLET | Freq: Every evening | ORAL | Status: DC
  Administered 2011-06-20: 40 mg via ORAL
  Filled 2011-06-20 (×2): qty 1

## 2011-06-20 MED ORDER — ONDANSETRON HCL 4 MG/2ML IJ SOLN
INTRAMUSCULAR | Status: DC | PRN
  Administered 2011-06-20: 4 mg via INTRAVENOUS

## 2011-06-20 MED ORDER — LACTATED RINGERS IV SOLN: INTRAVENOUS | Status: DC

## 2011-06-20 MED ORDER — PHENOL 1.4 % MT LIQD
1.0000 | OROMUCOSAL | Status: DC | PRN
  Administered 2011-06-20: 1 via OROMUCOSAL
  Filled 2011-06-20: qty 177

## 2011-06-20 MED ORDER — ACETAMINOPHEN 650 MG RE SUPP: 325.0000 mg | RECTAL | Status: DC | PRN

## 2011-06-20 MED ORDER — HYDROMORPHONE HCL PF 1 MG/ML IJ SOLN: 0.2500 mg | INTRAMUSCULAR | Status: DC | PRN

## 2011-06-20 MED ORDER — PHENYLEPHRINE HCL 10 MG/ML IJ SOLN
10.0000 mg | INTRAVENOUS | Status: DC | PRN
  Administered 2011-06-20: 50 ug/min via INTRAVENOUS

## 2011-06-20 MED ORDER — ALUM & MAG HYDROXIDE-SIMETH 400-400-40 MG/5ML PO SUSP
15.0000 mL | ORAL | Status: DC | PRN
  Filled 2011-06-20: qty 30

## 2011-06-20 MED ORDER — ALPRAZOLAM 0.5 MG PO TABS
0.5000 mg | ORAL_TABLET | Freq: Every evening | ORAL | Status: DC | PRN
  Administered 2011-06-20: 0.5 mg via ORAL
  Filled 2011-06-20: qty 1

## 2011-06-20 MED ORDER — HEPARIN SODIUM (PORCINE) 1000 UNIT/ML IJ SOLN
INTRAMUSCULAR | Status: DC | PRN
  Administered 2011-06-20: 9000 [IU] via INTRAVENOUS

## 2011-06-20 MED ORDER — METOPROLOL TARTRATE 1 MG/ML IV SOLN: 2.0000 mg | INTRAVENOUS | Status: DC | PRN

## 2011-06-20 MED ORDER — HYDRALAZINE HCL 20 MG/ML IJ SOLN
10.0000 mg | INTRAMUSCULAR | Status: DC | PRN
  Filled 2011-06-20: qty 0.5

## 2011-06-20 MED ORDER — MORPHINE SULFATE 2 MG/ML IJ SOLN
2.0000 mg | INTRAMUSCULAR | Status: DC | PRN
  Administered 2011-06-20: 4 mg via INTRAVENOUS
  Administered 2011-06-20 – 2011-06-21 (×4): 2 mg via INTRAVENOUS
  Filled 2011-06-20 (×2): qty 1
  Filled 2011-06-20: qty 2
  Filled 2011-06-20: qty 1

## 2011-06-20 MED ORDER — HYDROMORPHONE HCL PF 1 MG/ML IJ SOLN
0.2500 mg | INTRAMUSCULAR | Status: DC | PRN
  Administered 2011-06-20 (×2): 0.5 mg via INTRAVENOUS

## 2011-06-20 MED ORDER — LACTATED RINGERS IV SOLN
INTRAVENOUS | Status: DC | PRN
  Administered 2011-06-20 (×2): via INTRAVENOUS

## 2011-06-20 MED ORDER — MAGNESIUM HYDROXIDE 400 MG/5ML PO SUSP: 30.0000 mL | ORAL | Status: DC | PRN

## 2011-06-20 MED ORDER — LABETALOL HCL 5 MG/ML IV SOLN
INTRAVENOUS | Status: DC | PRN
  Administered 2011-06-20 (×2): 5 mg via INTRAVENOUS

## 2011-06-20 MED ORDER — POLYETHYLENE GLYCOL 3350 17 G PO PACK
17.0000 g | PACK | Freq: Every day | ORAL | Status: DC | PRN
  Filled 2011-06-20: qty 1

## 2011-06-20 MED ORDER — SODIUM CHLORIDE 0.9 % IV SOLN: INTRAVENOUS | Status: DC

## 2011-06-20 MED ORDER — ASPIRIN 325 MG PO TABS
325.0000 mg | ORAL_TABLET | Freq: Every day | ORAL | Status: DC
Start: 2011-06-20 — End: 2011-06-21
  Administered 2011-06-21: 325 mg via ORAL
  Filled 2011-06-20 (×2): qty 1

## 2011-06-20 MED ORDER — KCL IN DEXTROSE-NACL 20-5-0.45 MEQ/L-%-% IV SOLN
INTRAVENOUS | Status: DC
  Administered 2011-06-20 – 2011-06-21 (×2): via INTRAVENOUS
  Filled 2011-06-20 (×3): qty 1000

## 2011-06-20 SURGICAL SUPPLY — 50 items
CANISTER SUCTION 2500CC (MISCELLANEOUS) ×2 IMPLANT
CANNULA VESSEL W/WING WO/VALVE (CANNULA) ×2 IMPLANT
CATH ROBINSON RED A/P 18FR (CATHETERS) ×2 IMPLANT
CLIP TI MEDIUM 24 (CLIP) ×2 IMPLANT
CLIP TI WIDE RED SMALL 24 (CLIP) ×2 IMPLANT
CLOTH BEACON ORANGE TIMEOUT ST (SAFETY) ×2 IMPLANT
COVER SURGICAL LIGHT HANDLE (MISCELLANEOUS) ×4 IMPLANT
CRADLE DONUT ADULT HEAD (MISCELLANEOUS) ×2 IMPLANT
DECANTER SPIKE VIAL GLASS SM (MISCELLANEOUS) IMPLANT
DERMABOND ADVANCED (GAUZE/BANDAGES/DRESSINGS) ×1
DERMABOND ADVANCED .7 DNX12 (GAUZE/BANDAGES/DRESSINGS) ×1 IMPLANT
DRAIN HEMOVAC 1/8 X 5 (WOUND CARE) IMPLANT
DRAPE WARM FLUID 44X44 (DRAPE) ×2 IMPLANT
DRSG COVADERM 4X8 (GAUZE/BANDAGES/DRESSINGS) ×2 IMPLANT
ELECT REM PT RETURN 9FT ADLT (ELECTROSURGICAL) ×2
ELECTRODE REM PT RTRN 9FT ADLT (ELECTROSURGICAL) ×1 IMPLANT
EVACUATOR SILICONE 100CC (DRAIN) IMPLANT
GEL ULTRASOUND 20GR AQUASONIC (MISCELLANEOUS) IMPLANT
GLOVE BIO SURGEON STRL SZ7.5 (GLOVE) ×2 IMPLANT
GLOVE BIOGEL PI IND STRL 6.5 (GLOVE) ×4 IMPLANT
GLOVE BIOGEL PI IND STRL 7.0 (GLOVE) ×1 IMPLANT
GLOVE BIOGEL PI INDICATOR 6.5 (GLOVE) ×4
GLOVE BIOGEL PI INDICATOR 7.0 (GLOVE) ×1
GLOVE ECLIPSE 7.0 STRL STRAW (GLOVE) ×2 IMPLANT
GLOVE SS BIOGEL STRL SZ 6.5 (GLOVE) ×2 IMPLANT
GLOVE SUPERSENSE BIOGEL SZ 6.5 (GLOVE) ×2
GOWN PREVENTION PLUS XLARGE (GOWN DISPOSABLE) ×2 IMPLANT
GOWN STRL NON-REIN LRG LVL3 (GOWN DISPOSABLE) ×10 IMPLANT
KIT BASIN OR (CUSTOM PROCEDURE TRAY) ×2 IMPLANT
KIT ROOM TURNOVER OR (KITS) ×2 IMPLANT
LOOP VESSEL MINI RED (MISCELLANEOUS) IMPLANT
NEEDLE HYPO 25GX1X1/2 BEV (NEEDLE) IMPLANT
NS IRRIG 1000ML POUR BTL (IV SOLUTION) ×4 IMPLANT
PACK CAROTID (CUSTOM PROCEDURE TRAY) ×2 IMPLANT
PAD ARMBOARD 7.5X6 YLW CONV (MISCELLANEOUS) ×4 IMPLANT
PATCH HEMASHIELD 8X75 (Vascular Products) ×2 IMPLANT
SHUNT CAROTID BYPASS 10 (VASCULAR PRODUCTS) ×2 IMPLANT
SPECIMEN JAR SMALL (MISCELLANEOUS) ×2 IMPLANT
SPONGE SURGIFOAM ABS GEL 100 (HEMOSTASIS) IMPLANT
SUT ETHILON 3 0 PS 1 (SUTURE) IMPLANT
SUT PROLENE 6 0 CC (SUTURE) ×2 IMPLANT
SUT PROLENE 7 0 BV 1 (SUTURE) IMPLANT
SUT VIC AB 3-0 SH 27 (SUTURE) ×1
SUT VIC AB 3-0 SH 27X BRD (SUTURE) ×1 IMPLANT
SUT VICRYL 4-0 PS2 18IN ABS (SUTURE) ×2 IMPLANT
SYR CONTROL 10ML LL (SYRINGE) IMPLANT
TOWEL OR 17X24 6PK STRL BLUE (TOWEL DISPOSABLE) ×2 IMPLANT
TOWEL OR 17X26 10 PK STRL BLUE (TOWEL DISPOSABLE) ×2 IMPLANT
TRAY FOLEY CATH 14FRSI W/METER (CATHETERS) ×2 IMPLANT
WATER STERILE IRR 1000ML POUR (IV SOLUTION) ×2 IMPLANT

## 2011-06-20 NOTE — Anesthesia Preprocedure Evaluation (Addendum)
Anesthesia Evaluation   Patient awake    Reviewed: Allergy & Precautions, H&P , NPO status , Patient's Chart, lab work & pertinent test results  Airway Mallampati: I TM Distance: >3 FB Neck ROM: full    Dental  (+) Edentulous Upper, Missing and Poor Dentition   Pulmonary neg pulmonary ROS,          Cardiovascular Exercise Tolerance: Poor hypertension, + Peripheral Vascular Disease regular Normal    Neuro/Psych Negative Neurological ROS  Negative Psych ROS   GI/Hepatic negative GI ROS, (+) Hepatitis -  Endo/Other  Negative Endocrine ROS  Renal/GU negative Renal ROS  Genitourinary negative   Musculoskeletal negative musculoskeletal ROS (+)   Abdominal   Peds  Hematology negative hematology ROS (+)   Anesthesia Other Findings   Reproductive/Obstetrics negative OB ROS                          Anesthesia Physical Anesthesia Plan  ASA: III  Anesthesia Plan: General   Post-op Pain Management:    Induction: Intravenous  Airway Management Planned: Oral ETT  Additional Equipment: Arterial line  Intra-op Plan:   Post-operative Plan: Extubation in OR  Informed Consent: I have reviewed the patients History and Physical, chart, labs and discussed the procedure including the risks, benefits and alternatives for the proposed anesthesia with the patient or authorized representative who has indicated his/her understanding and acceptance.     Plan Discussed with: CRNA, Anesthesiologist and Surgeon  Anesthesia Plan Comments:         Anesthesia Quick Evaluation

## 2011-06-20 NOTE — Anesthesia Postprocedure Evaluation (Signed)
  Anesthesia Post-op Note  Patient: Randall Mcgee  Procedure(s) Performed:  ENDARTERECTOMY CAROTID - Right Carotid endarterectomy with dacron patch angioplasty   Patient Location: PACU  Anesthesia Type: General  Level of Consciousness: awake, alert  and oriented  Airway and Oxygen Therapy: Patient Spontanous Breathing and Patient connected to nasal cannula oxygen  Post-op Pain: mild  Post-op Assessment: Post-op Vital signs reviewed, Patient's Cardiovascular Status Stable, Respiratory Function Stable, Patent Airway, No signs of Nausea or vomiting and Pain level controlled  Post-op Vital Signs: Reviewed and stable  Complications: No apparent anesthesia complications

## 2011-06-20 NOTE — Preoperative (Signed)
Beta Blockers   Reason not to administer Beta Blockers:Not Applicable 

## 2011-06-20 NOTE — Progress Notes (Signed)
Patient ID: Randall Mcgee, male   DOB: Apr 18, 1941, 70 y.o.   MRN: [id number]   The patient has been re-examined.  The patient's history and physical has been reviewed and is unchanged.  There is no change in the plan of care. Right carotid endarterectomy today.   I discussed with the patient the risks of carotid endarterectomy including but not limited to myocardial events 5%, cranial nerve injury 10-15%, stroke 1-2%, bleeding or infection 1%  I also discussed the benefits of long term stroke prevention and the advantage compared to medical therapy  The patient is aware of the risks and agrees to proceed forward with the procedure.  Fabienne Bruns, MD

## 2011-06-20 NOTE — Op Note (Signed)
Procedure Right carotid endarterectomy  Preoperative diagnosis: High-grade asymptomatic right internal carotid artery stenosis  Postoperative diagnosis: Same  Anesthesia General  Asst.: Newton Pigg, PAC  Operative findings: #1 greater than 90% right internal carotid artery stenosis with hemorrhagic plaque and periarterial inflammation  #2 10 French shunt  #3 Dacron patch  Operative details: After obtaining informed consent, the patient was taken to the operating room. The patient was placed in a supine position on the operating room table. After induction of general anesthesia and endotracheal intubation a Foley catheter was placed. Next the patient's entire neck and chest was prepped and draped in usual sterile fashion. An oblique incision was made on the right aspect of the patient's neck anterior to the border the right sternocleidomastoid muscle. The incision was carried into the subcutaneous tissues and through the platysma. Sternocleidomastoid muscle was identified and reflected laterally. The omohyoid muscle was identified this was divided with cautery. Common carotid artery was then found at the base of the incision this was dissected free circumferentially. The vagus nerve was identified and protected. Dissection was then carried up to the level carotid bifurcation. There was significant inflammation and adhesions around this area.  The Ansa cervicalis was identified and traced up to its insertion into the hypoglossal nerve.  The ansa was divided at this level to assist with exposure.  The hyperglossal nerve was identified and protected. The internal carotid artery was dissected free circumferentially just below the level of the hypoglossal nerve and it was soft in character at this location and above any palpable disease. A vessel loop was placed around this. Next the external carotid and superior thyroid arteries were dissected free circumferentially and vessel loops were placed  around these. The patient was given 9000 units of intravenous heparin. After 2 minutes of circulation time and raising the mean arterial pressure to 85 mm mercury, the distal internal carotid artery was controlled with small bulldog clamp. The internal carotid artery was rotated behind the external carotid artery and this required some manipulation and mobilization of the hypoglossal nerve in order to expose an adequate segment of the internal that was disease free on palpation.  The external carotid and superior thyroid arteries were controlled with vessel loops. The common carotid artery was controlled peripheral DeBakey clamp. A longitudinal opening was made in the common carotid artery just below the bifurcation. The arteriotomy was extended distally up in the internal carotid with Potts scissors. There was a large plaque with greater than 90% stenosis at the carotid bifurcation which had evidence of recent hemorrhage and a large amount of atheromatous debris. The arteriotomy was extended passed the level of stenosis. Next a 10 [place] shunt was brought in the operative field was threaded in the distal internal carotid artery and allowed to backbleed thoroughly.   There was good backbleeding from the internal carotid artery.  The shunt was then threaded into the common carotid artery and secured with a Rummel tourniquet. Shunt was inspected and found to be free of air. Flow was then restored to the brain after approximately 6 minutes of ischemia time.  Attention was then turned to the common carotid artery once again. A suitable endarterectomy plane was obtained and endarterectomy was begun in the common carotid artery and a good proximal endpoint was obtained. An eversion endarterectomy was performed on the external carotid artery and a good endpoint was obtained. The plaque was then elevated in the internal carotid artery and a nice feathered distal endpoint was also obtained. The  plaque was passed off the  table as a specimen. All loose debris was then removed from the carotid bed and everything was thoroughly irrigated with heparinized saline. The Dacron patch was then brought out on to the operative field this was sewn on as a patch angioplasty using running 6-0 Prolene suture. Prior to completion of the anastomosis the shunt was reoccluded and brought down out of the distal internal carotid artery. The internal carotid artery was thoroughly backbled. This was then controlled again with small bulldog clamp. The shunt was then removed from the proximal common carotid artery and this is again secured with a peripheral DeBakey clamp after forward bleeding. The external carotid was also thoroughly backbled.  The remainder of the patch was completed and the anastomosis was secured. Flow was then restored first retrograde from the external carotid into the carotid bed then antegrade from the common carotid to the external carotid artery and after approximately 5 cardiac cycles to the internal carotid artery. Doppler was used to evaluate the external/internal and common carotid arteries and these all had good Doppler flow. Hemostasis was obtained with an additional repair stitch on the medial wall of the artery. The heparin was reversed with 70 mg Protamine.   Hemostasis was obtained.  The platysma muscle was reapproximated using a running 3-0 Vicryl suture. The skin was closed with 4 Vicryl subcuticular stitch.  The patient tolerated the procedure well.  The patient was awakened in the operating room and was following commands and moving upper extremities symmetrically prior to transfer to the PACU.   Fabienne Bruns, MD [facility] [place] Office: [phone] Pager: [phone]

## 2011-06-20 NOTE — Transfer of Care (Signed)
Immediate Anesthesia Transfer of Care Note  Patient: Randall Mcgee  Procedure(s) Performed:  ENDARTERECTOMY CAROTID - Right Carotid endarterectomy with dacron patch angioplasty   Patient Location: PACU  Anesthesia Type: General  Level of Consciousness: awake, alert , oriented and patient cooperative  Airway & Oxygen Therapy: Patient Spontanous Breathing and Patient connected to face mask oxygen  Post-op Assessment: Report given to PACU RN, Post -op Vital signs reviewed and stable and Patient moving all extremities X 4  Post vital signs: Reviewed  Complications: No apparent anesthesia complications

## 2011-06-21 ENCOUNTER — Encounter (HOSPITAL_COMMUNITY): Payer: Self-pay | Admitting: Vascular Surgery

## 2011-06-21 LAB — CBC
Hemoglobin: 11 g/dL — ABNORMAL LOW (ref 13.0–17.0)
MCH: 32.8 pg (ref 26.0–34.0)
MCHC: 34.1 g/dL (ref 30.0–36.0)
MCV: 96.4 fL (ref 78.0–100.0)

## 2011-06-21 LAB — BASIC METABOLIC PANEL
Calcium: 7.9 mg/dL — ABNORMAL LOW (ref 8.4–10.5)
Creatinine, Ser: 0.91 mg/dL (ref 0.50–1.35)
GFR calc non Af Amer: 83 mL/min — ABNORMAL LOW (ref >90)
Glucose, Bld: 122 mg/dL — ABNORMAL HIGH (ref 70–99)
Sodium: 138 mEq/L (ref 135–145)

## 2011-06-21 NOTE — Progress Notes (Addendum)
[facility] Progress Note  06/21/2011 7:28 AM POD 1  Subjective:  "I'm a little sore"  States he is having trouble swallowing b/c his throat is sore, but improving.  Also, "head hurting over my sinuses" (points to eyes).   Tm 100.4.  Now 99.1 Filed Vitals:   06/21/11 0330  BP: 130/72  Pulse: 74  Temp: 99.1 F (37.3 C)  Resp: 13   Physical Exam: Neuro:  In tact without deficit. Incision:  C/d/i Lungs:  Non-labored. On RA  CBC    Component Value Date/Time   WBC 8.6 06/21/2011 0535   RBC 3.35* 06/21/2011 0535   HGB 11.0* 06/21/2011 0535   HCT 32.3* 06/21/2011 0535   PLT 143* 06/21/2011 0535   MCV 96.4 06/21/2011 0535   MCH 32.8 06/21/2011 0535   MCHC 34.1 06/21/2011 0535   RDW 13.1 06/21/2011 0535   LYMPHSABS 2.7 05/02/2011 0826   MONOABS 0.7 05/02/2011 0826   EOSABS 0.1 05/02/2011 0826   BASOSABS 0.0 05/02/2011 0826    BMET    Component Value Date/Time   NA 138 06/21/2011 0535   K 3.9 06/21/2011 0535   CL 105 06/21/2011 0535   CO2 27 06/21/2011 0535   GLUCOSE 122* 06/21/2011 0535   BUN 12 06/21/2011 0535   CREATININE 0.91 06/21/2011 0535   CALCIUM 7.9* 06/21/2011 0535   GFRNONAA 83* 06/21/2011 0535   GFRAA >90 06/21/2011 0535       Assessment/Plan:  This is a 70 y.o. male who is s/p right CEA  POD 1 -Neuro exam in tact.   -Does state that he is having a little trouble swallowing.  Difficult to determine if this is from soreness from the ET tube or mechanical difficulty.  If he does not tolerate his breakfast, he may need a swallow evaluation. -Pt needs to ambulate and void. -Acute surgical blood loss anemia-pt tolerating.  Newton Pigg, PA-C [facility] [phone]  History details as above.  Pt has now eaten breakfast without difficulty  Exam: no hematoma, tongue midline, UE LE 5/5 motor  Plan for d/c home today ASA qd F/U 2-3 weeks

## 2011-06-21 NOTE — Plan of Care (Signed)
Problem: Phase I Progression Outcomes Goal: Pain controlled with appropriate interventions Pain controlled with meds

## 2011-06-21 NOTE — Progress Notes (Signed)
Pt ambulating, eating, and able to void without difficulty.  Discharge instructions given to patient and spouse.  Both verbalized understanding.  Pt discharged home with wife.

## 2011-06-21 NOTE — Discharge Summary (Signed)
[facility] Discharge Summary  Randall Mcgee 1941-04-01 70 y.o. male  [id number]  Admission Date: 06/20/2011  Discharge Date: 06/21/11  Physician: Sherren Kerns, MD  Admission Diagnosis: Right ICA Stenosis   HPI:   This is a 70 y.o. male who presents for evaluation of carotid stenosis. The patient denies symptoms of TIA, amaurosis, or stroke. The patient is currently on aspirin antiplatelet therapy. The carotid stenosis was found on duplex evaluation. Other medical problems include hyperlipidemia and hypertension. He is a remote smoker. He denies dyspnea, chest pain, or claudication. His medical problems are currently stable and followed by Dr Amador Cunas.    Hospital Course:  The patient was admitted to the hospital and taken to the operating room on 06/20/2011 and underwent Procedure(s):  Asymptomatic right ENDARTERECTOMY CAROTID.  The pt tolerated the procedure well and was transported to the PACU in good condition. By POD 1, his neuro exam is in tact and he is doing quite well.  Otherwise, the remainder of the hospital course consisted of increasing ambulation and increasing intake of solids without difficulty.    Basename 06/21/11 0535  NA 138  K 3.9  CL 105  CO2 27  GLUCOSE 122*  BUN 12  CALCIUM 7.9*    Basename 06/21/11 0535  WBC 8.6  HGB 11.0*  HCT 32.3*  PLT 143*      Discharge Instructions:   The patient is discharged to home with extensive instructions on wound care and progressive ambulation.  They are instructed not to drive or perform any heavy lifting until returning to see the physician in his office.  Discharge Diagnosis:  Right ICA Stenosis  Secondary Diagnosis: Patient Active Problem List  Diagnoses  . Hypertension  . Hypercholesterolemia   Past Medical History  Diagnosis Date  . Hyperlipidemia   . Hypertension   . History of chicken pox        Randall Mcgee, Randall Mcgee  Home Medication Instructions [id number]   Printed on:06/21/11 1025  Medication Information                    aspirin 325 MG tablet Take 325 mg by mouth daily.            Omega-3 Fatty Acids (FISH OIL) 1200 MG CAPS Take 1,200 mg by mouth daily.            GINSENG PO Take 450 mg by mouth daily.            Multiple Vitamins-Minerals (CENTRUM SILVER PO) Take 1 tablet by mouth daily.            St Johns Wort 300 MG CAPS Take 1 capsule by mouth daily.            atenolol (TENORMIN) 50 MG tablet Take 1 tablet (50 mg total) by mouth daily.           lisinopril (PRINIVIL,ZESTRIL) 20 MG tablet Take 1 tablet (20 mg total) by mouth daily.           indomethacin (INDOCIN) 25 MG capsule Take 1 capsule (25 mg total) by mouth 3 (three) times daily as needed.           simvastatin (ZOCOR) 40 MG tablet Take 1 tablet (40 mg total) by mouth every evening.           ALPRAZolam (XANAX) 0.5 MG tablet Take 0.5 mg by mouth at bedtime as needed. For anxiety.  sildenafil (VIAGRA) 100 MG tablet Take 100 mg by mouth daily as needed. For erectile dysfynction            oxyCODONE (ROXICODONE) 5 MG immediate release tablet Take 1 tablet (5 mg total) by mouth every 4 (four) hours as needed for pain.             Disposition:  home  Patient's condition: is Good   Newton Pigg, PA-C [facility] [phone] 06/21/2011  10:25 AM

## 2011-06-23 NOTE — Procedures (Unsigned)
CAROTID DUPLEX EXAM  INDICATION:  Carotid bruit; repeat exam from outside facility.  HISTORY: Diabetes:  No. Cardiac:  No. Hypertension:  Yes. Smoking:  Previous. Previous Surgery:  No. CV History:  Previous exam showed possible string sign on right ICA. Amaurosis Fugax No, Paresthesias No, Hemiparesis No.                                      RIGHT                LEFT Brachial systolic pressure: Brachial Doppler waveforms: Vertebral direction of flow:        Antegrade DUPLEX VELOCITIES (cm/sec) CCA peak systolic                   61 ECA peak systolic                   104 ICA peak systolic                   430 ICA end diastolic                   142 PLAQUE MORPHOLOGY:                  Heterogenous/irregular PLAQUE AMOUNT:                      Severe PLAQUE LOCATION:                    CCA/ICA  IMPRESSION: 1. 80% to 99% right internal carotid artery stenosis.  Lesion is     approximately 1.7 cm in length beginning at the bifurcation.  The     vessel is patent beyond the lesion; however, distal waveforms are     dampened, which may indicated intracranial disease. 2. Right external carotid artery is low resistant without evidence of     plaque. 3. Right vertebral artery is within normal limits.  ___________________________________________ Janetta Hora Fields, MD  LT/MEDQ  D:  05/25/2011  T:  05/25/2011  Job:  [id number]

## 2011-06-28 ENCOUNTER — Encounter: Payer: Self-pay | Admitting: Vascular Surgery

## 2011-06-29 ENCOUNTER — Encounter: Payer: Self-pay | Admitting: Vascular Surgery

## 2011-06-29 ENCOUNTER — Ambulatory Visit (INDEPENDENT_AMBULATORY_CARE_PROVIDER_SITE_OTHER): Payer: Medicare Other | Admitting: Vascular Surgery

## 2011-06-29 VITALS — BP 174/74 | HR 63 | Temp 98.2°F | Resp 18 | Ht 71.0 in | Wt 201.0 lb

## 2011-06-29 DIAGNOSIS — Z9889 Other specified postprocedural states: Secondary | ICD-10-CM

## 2011-06-29 DIAGNOSIS — I6529 Occlusion and stenosis of unspecified carotid artery: Secondary | ICD-10-CM

## 2011-06-29 NOTE — Progress Notes (Signed)
[facility] [place] HISTORY AND PHYSICAL    History of Present Illness:  Patient is a 70 y.o. year old male who presents for post-operative follow-up after right carotid endarterectomy.  Denies headaches, numbness, tingling or other neuro deficits.  No swallowing problems.  No incisional drainage.  Taking aspirin.   Physical Examination  Filed Vitals:   06/29/11 1107  BP: 174/74  Pulse: 63  Temp:   Resp:     Body mass index is 28.03 kg/(m^2).  General:  Alert and oriented, no acute distress Neck: No bruit or JVD Skin: No rash Extremities:  No edema Neurologic: Upper and lower extremity motor 5/5 and symmetric    ASSESSMENT: Doing well status post right carotid endarterectomy. His previous duplex exam showed a 40-60% stenosis on the left side. This is currently asymptomatic.   PLAN: Repeat carotid duplex in 6 months time and continue aspirin   Fabienne Bruns, MD [facility] [place] Office: [phone] Pager: [phone]

## 2011-07-06 ENCOUNTER — Other Ambulatory Visit: Payer: Self-pay

## 2011-07-06 MED ORDER — ALPRAZOLAM 0.5 MG PO TABS: 0.5000 mg | ORAL_TABLET | Freq: Every evening | ORAL | Status: DC | PRN

## 2011-08-07 DIAGNOSIS — C434 Malignant melanoma of scalp and neck: Secondary | ICD-10-CM | POA: Diagnosis not present

## 2011-08-07 DIAGNOSIS — C4499 Other specified malignant neoplasm of skin, unspecified: Secondary | ICD-10-CM | POA: Diagnosis not present

## 2011-08-07 DIAGNOSIS — D485 Neoplasm of uncertain behavior of skin: Secondary | ICD-10-CM | POA: Diagnosis not present

## 2011-09-08 ENCOUNTER — Other Ambulatory Visit: Payer: Self-pay

## 2011-09-08 NOTE — Telephone Encounter (Signed)
ok 

## 2011-09-08 NOTE — Telephone Encounter (Signed)
Rx request for alprazolam 0.5 mg #30.  Rx last filled 07/06/11 #30 x 1 rf. Pt last seen 06/29/11.  Pls advise.

## 2011-09-11 MED ORDER — ALPRAZOLAM 0.5 MG PO TABS: 0.5000 mg | ORAL_TABLET | Freq: Every evening | ORAL | Status: DC | PRN

## 2011-09-11 NOTE — Telephone Encounter (Signed)
Called in to [facility]

## 2011-09-14 DIAGNOSIS — Z85828 Personal history of other malignant neoplasm of skin: Secondary | ICD-10-CM | POA: Diagnosis not present

## 2011-09-14 DIAGNOSIS — L570 Actinic keratosis: Secondary | ICD-10-CM | POA: Diagnosis not present

## 2011-09-14 DIAGNOSIS — Z85820 Personal history of malignant melanoma of skin: Secondary | ICD-10-CM | POA: Diagnosis not present

## 2011-11-07 ENCOUNTER — Ambulatory Visit (INDEPENDENT_AMBULATORY_CARE_PROVIDER_SITE_OTHER): Payer: Medicare Other | Admitting: Internal Medicine

## 2011-11-07 ENCOUNTER — Encounter: Payer: Self-pay | Admitting: Internal Medicine

## 2011-11-07 VITALS — BP 110/70 | Temp 98.3°F | Wt 200.0 lb

## 2011-11-07 DIAGNOSIS — M4000 Postural kyphosis, site unspecified: Secondary | ICD-10-CM

## 2011-11-07 DIAGNOSIS — K4090 Unilateral inguinal hernia, without obstruction or gangrene, not specified as recurrent: Secondary | ICD-10-CM | POA: Diagnosis not present

## 2011-11-07 DIAGNOSIS — M40209 Unspecified kyphosis, site unspecified: Secondary | ICD-10-CM

## 2011-11-07 DIAGNOSIS — E7800 Pure hypercholesterolemia, unspecified: Secondary | ICD-10-CM | POA: Diagnosis not present

## 2011-11-07 DIAGNOSIS — I6529 Occlusion and stenosis of unspecified carotid artery: Secondary | ICD-10-CM | POA: Diagnosis not present

## 2011-11-07 DIAGNOSIS — I10 Essential (primary) hypertension: Secondary | ICD-10-CM

## 2011-11-07 MED ORDER — ALPRAZOLAM 0.5 MG PO TABS: 0.5000 mg | ORAL_TABLET | Freq: Every evening | ORAL | Status: DC | PRN

## 2011-11-07 NOTE — Patient Instructions (Signed)
It is important that you exercise regularly, at least 20 minutes 3 to 4 times per week.  If you develop chest pain or shortness of breath seek  medical attention.  Take a calcium supplement, plus [phone] units of vitamin D  Bone density study as discussed  Return in 6 months for follow-up

## 2011-11-07 NOTE — Progress Notes (Signed)
  Subjective:    Patient ID: Randall Mcgee, male    DOB: January 04, 1941, 71 y.o.   MRN: [id number]  HPI  71 year old patient who is seen today for his six-month followup. He has a history of treated hypertension as well as dyslipidemia blood pressure remains nicely controlled. Remains on simvastatin 40 mg daily and continues to tolerate well He was seen for his annual exam 6 months ago and is now status post right carotid endarterectomy. He also is referred to dermatology and has had a resection for a scalp skin cancer performed. He is followed closely by vascular surgery and dermatology. Complaints include poor posture. His wife feels that he has become more stooped over and is concerned about osteoporosis. The patient has also noted a bulge in the right groin area.   Review of Systems  Constitutional: Negative for fever, chills, appetite change and fatigue.  HENT: Negative for hearing loss, ear pain, congestion, sore throat, trouble swallowing, neck stiffness, dental problem, voice change and tinnitus.   Eyes: Negative for pain, discharge and visual disturbance.  Respiratory: Negative for cough, chest tightness, wheezing and stridor.   Cardiovascular: Negative for chest pain, palpitations and leg swelling.  Gastrointestinal: Negative for nausea, vomiting, abdominal pain, diarrhea, constipation, blood in stool and abdominal distention.  Genitourinary: Negative for urgency, hematuria, flank pain, discharge, difficulty urinating and genital sores.  Musculoskeletal: Negative for myalgias, back pain, joint swelling, arthralgias and gait problem.  Skin: Negative for rash.  Neurological: Negative for dizziness, syncope, speech difficulty, weakness, numbness and headaches.  Hematological: Negative for adenopathy. Does not bruise/bleed easily.  Psychiatric/Behavioral: Negative for behavioral problems and dysphoric mood. The patient is not nervous/anxious.        Objective:   Physical Exam    Constitutional: He is oriented to person, place, and time. He appears well-developed.  HENT:  Head: Normocephalic.  Right Ear: External ear normal.  Left Ear: External ear normal.  Eyes: Conjunctivae and EOM are normal.  Neck: Normal range of motion.  Cardiovascular: Normal rate and normal heart sounds.   Pulmonary/Chest: Breath sounds normal.  Abdominal: Soft. Bowel sounds are normal.       Moderate right femoral hernia easily reducible  Musculoskeletal: Normal range of motion. He exhibits no edema and no tenderness.       Suggestion of some mild kyphosis  Neurological: He is alert and oriented to person, place, and time.  Psychiatric: He has a normal mood and affect. His behavior is normal.          Assessment & Plan:   Hypertension stable we'll continue present regimen Hypercholesterolemia. We'll continue simvastatin Status post right carotid endarterectomy stable Right femoral hernia. We'll observe  Mild kyphosis. We'll check a bone density study calcium and vitamin D supplementations encouraged

## 2011-11-14 ENCOUNTER — Ambulatory Visit (INDEPENDENT_AMBULATORY_CARE_PROVIDER_SITE_OTHER)
Admission: RE | Admit: 2011-11-14 | Discharge: 2011-11-14 | Disposition: A | Discharge status: Home / Self Care | Payer: Medicare Other | Source: Ambulatory Visit | Attending: Internal Medicine | Admitting: Internal Medicine

## 2011-11-14 DIAGNOSIS — M4000 Postural kyphosis, site unspecified: Secondary | ICD-10-CM | POA: Diagnosis not present

## 2011-11-14 DIAGNOSIS — M40209 Unspecified kyphosis, site unspecified: Secondary | ICD-10-CM

## 2011-11-23 ENCOUNTER — Other Ambulatory Visit: Payer: Self-pay | Admitting: *Deleted

## 2011-11-23 DIAGNOSIS — I6529 Occlusion and stenosis of unspecified carotid artery: Secondary | ICD-10-CM

## 2011-11-24 ENCOUNTER — Encounter (INDEPENDENT_AMBULATORY_CARE_PROVIDER_SITE_OTHER): Payer: Medicare Other

## 2011-11-24 DIAGNOSIS — I6529 Occlusion and stenosis of unspecified carotid artery: Secondary | ICD-10-CM

## 2011-11-28 NOTE — Progress Notes (Signed)
Quick Note:  Spoke with wife - informed test normal ______

## 2011-12-19 ENCOUNTER — Other Ambulatory Visit: Payer: Self-pay | Admitting: Internal Medicine

## 2011-12-28 ENCOUNTER — Other Ambulatory Visit: Payer: Medicare Other

## 2012-02-20 DIAGNOSIS — Z85828 Personal history of other malignant neoplasm of skin: Secondary | ICD-10-CM | POA: Diagnosis not present

## 2012-02-20 DIAGNOSIS — D485 Neoplasm of uncertain behavior of skin: Secondary | ICD-10-CM | POA: Diagnosis not present

## 2012-02-20 DIAGNOSIS — L570 Actinic keratosis: Secondary | ICD-10-CM | POA: Diagnosis not present

## 2012-02-20 DIAGNOSIS — Z85820 Personal history of malignant melanoma of skin: Secondary | ICD-10-CM | POA: Diagnosis not present

## 2012-05-02 ENCOUNTER — Other Ambulatory Visit (INDEPENDENT_AMBULATORY_CARE_PROVIDER_SITE_OTHER): Payer: Medicare Other

## 2012-05-02 ENCOUNTER — Other Ambulatory Visit: Payer: Self-pay | Admitting: Internal Medicine

## 2012-05-02 DIAGNOSIS — R351 Nocturia: Secondary | ICD-10-CM | POA: Diagnosis not present

## 2012-05-02 DIAGNOSIS — E785 Hyperlipidemia, unspecified: Secondary | ICD-10-CM | POA: Diagnosis not present

## 2012-05-02 DIAGNOSIS — I10 Essential (primary) hypertension: Secondary | ICD-10-CM | POA: Diagnosis not present

## 2012-05-02 DIAGNOSIS — Z0000 Encounter for general adult medical examination without abnormal findings: Secondary | ICD-10-CM

## 2012-05-02 LAB — CBC WITH DIFFERENTIAL/PLATELET
Basophils Absolute: 0 10*3/uL (ref 0.0–0.1)
Eosinophils Relative: 1.3 % (ref 0.0–5.0)
HCT: 41 % (ref 39.0–52.0)
Lymphocytes Relative: 37.6 % (ref 12.0–46.0)
Monocytes Relative: 11.3 % (ref 3.0–12.0)
Neutrophils Relative %: 49.3 % (ref 43.0–77.0)
Platelets: 183 10*3/uL (ref 150.0–400.0)
RDW: 13.7 % (ref 11.5–14.6)
WBC: 7.5 10*3/uL (ref 4.5–10.5)

## 2012-05-02 LAB — POCT URINALYSIS DIPSTICK
Ketones, UA: NEGATIVE
Protein, UA: NEGATIVE
Spec Grav, UA: 1.02
Urobilinogen, UA: 0.2

## 2012-05-02 LAB — PSA: PSA: 2.19 ng/mL (ref 0.10–4.00)

## 2012-05-02 LAB — BASIC METABOLIC PANEL
BUN: 19 mg/dL (ref 6–23)
Calcium: 9.4 mg/dL (ref 8.4–10.5)
Creatinine, Ser: 1.1 mg/dL (ref 0.4–1.5)
GFR: 73.47 mL/min (ref >60.00)
Glucose, Bld: 91 mg/dL (ref 70–99)
Potassium: 4.5 mEq/L (ref 3.5–5.1)

## 2012-05-02 LAB — LIPID PANEL
Cholesterol: 170 mg/dL (ref 0–200)
HDL: 42.1 mg/dL (ref >39.00)
LDL Cholesterol: 114 mg/dL — ABNORMAL HIGH (ref 0–99)
Triglycerides: 71 mg/dL (ref 0.0–149.0)
VLDL: 14.2 mg/dL (ref 0.0–40.0)

## 2012-05-02 LAB — HEPATIC FUNCTION PANEL
ALT: 18 U/L (ref 0–53)
AST: 26 U/L (ref 0–37)
Total Bilirubin: 0.8 mg/dL (ref 0.3–1.2)
Total Protein: 6.7 g/dL (ref 6.0–8.3)

## 2012-05-02 LAB — TSH: TSH: 5.27 u[IU]/mL (ref 0.35–5.50)

## 2012-05-02 MED ORDER — ALPRAZOLAM 0.5 MG PO TABS: 0.5000 mg | ORAL_TABLET | Freq: Every evening | ORAL | Status: DC | PRN

## 2012-05-02 NOTE — Telephone Encounter (Signed)
Called in - needs to be seen soon

## 2012-05-02 NOTE — Telephone Encounter (Signed)
Patient came in stating that he need to have a refill of his xanax called into [facility]. Please assist.

## 2012-05-03 ENCOUNTER — Other Ambulatory Visit: Payer: Self-pay

## 2012-05-03 MED ORDER — ALPRAZOLAM 0.5 MG PO TABS: 0.5000 mg | ORAL_TABLET | Freq: Every evening | ORAL | Status: DC | PRN

## 2012-05-03 NOTE — Telephone Encounter (Signed)
Reprint - was phoned in - but [facility] is not showing rx - printed for pt who is here to get rx . KIK

## 2012-05-09 ENCOUNTER — Encounter: Payer: Medicare Other | Admitting: Internal Medicine

## 2012-05-14 ENCOUNTER — Ambulatory Visit (INDEPENDENT_AMBULATORY_CARE_PROVIDER_SITE_OTHER): Payer: Medicare Other | Admitting: Internal Medicine

## 2012-05-14 ENCOUNTER — Encounter: Payer: Self-pay | Admitting: Internal Medicine

## 2012-05-14 VITALS — BP 140/90 | HR 70 | Temp 98.2°F | Resp 16 | Ht 69.5 in | Wt 203.0 lb

## 2012-05-14 DIAGNOSIS — Z23 Encounter for immunization: Secondary | ICD-10-CM

## 2012-05-14 DIAGNOSIS — E7800 Pure hypercholesterolemia, unspecified: Secondary | ICD-10-CM | POA: Diagnosis not present

## 2012-05-14 DIAGNOSIS — Z0000 Encounter for general adult medical examination without abnormal findings: Secondary | ICD-10-CM | POA: Diagnosis not present

## 2012-05-14 DIAGNOSIS — K4090 Unilateral inguinal hernia, without obstruction or gangrene, not specified as recurrent: Secondary | ICD-10-CM | POA: Diagnosis not present

## 2012-05-14 DIAGNOSIS — I10 Essential (primary) hypertension: Secondary | ICD-10-CM | POA: Diagnosis not present

## 2012-05-14 DIAGNOSIS — I6529 Occlusion and stenosis of unspecified carotid artery: Secondary | ICD-10-CM

## 2012-05-14 MED ORDER — LISINOPRIL 20 MG PO TABS: 20.0000 mg | ORAL_TABLET | Freq: Every day | ORAL | Status: DC

## 2012-05-14 MED ORDER — ATENOLOL 50 MG PO TABS: 50.0000 mg | ORAL_TABLET | Freq: Every day | ORAL | Status: DC

## 2012-05-14 MED ORDER — ALPRAZOLAM 0.5 MG PO TABS: 0.5000 mg | ORAL_TABLET | Freq: Every evening | ORAL | Status: DC | PRN

## 2012-05-14 MED ORDER — SIMVASTATIN 40 MG PO TABS: 40.0000 mg | ORAL_TABLET | Freq: Every evening | ORAL | Status: DC

## 2012-05-14 MED ORDER — SILDENAFIL CITRATE 100 MG PO TABS: 100.0000 mg | ORAL_TABLET | Freq: Every day | ORAL | Status: DC | PRN

## 2012-05-14 NOTE — Patient Instructions (Signed)

## 2012-05-14 NOTE — Progress Notes (Signed)
Subjective:    Patient ID: Randall Mcgee, male    DOB: 1941-01-06, 71 y.o.   MRN: [id number]  HPI  71 year old patient who is seen today for a preventive health examination. He is status post right carotid endarterectomy and has done quite well. He has treated hypertension and dyslipidemia  Past Medical History  Diagnosis Date  . Hyperlipidemia   . Hypertension   . History of chicken pox     History   Social History  . Marital Status: Married    Spouse Name: N/A    Number of Children: N/A  . Years of Education: N/A   Occupational History  . Not on file.   Social History Main Topics  . Smoking status: Never Smoker   . Smokeless tobacco: Never Used  . Alcohol Use: No  . Drug Use: No  . Sexually Active: Not on file   Other Topics Concern  . Not on file   Social History Narrative  . No narrative on file    Past Surgical History  Procedure Date  . Cholecystectomy   . Tonsillectomy   . Tonsillectomy   . Endarterectomy 06/20/11  . Endarterectomy 06/20/2011    Procedure: ENDARTERECTOMY CAROTID;  Surgeon: Sherren Kerns, MD;  Location: [facility] OR;  Service: Vascular;  Laterality: Right;  Right Carotid endarterectomy with dacron patch angioplasty     Family History  Problem Relation Age of Onset  . Heart disease Mother     No Known Allergies  Current Outpatient Prescriptions on File Prior to Visit  Medication Sig Dispense Refill  . ALPRAZolam (XANAX) 0.5 MG tablet Take 1 tablet (0.5 mg total) by mouth at bedtime as needed. For anxiety.  60 tablet  0  . aspirin 325 MG tablet Take 325 mg by mouth daily.       Marland Kitchen atenolol (TENORMIN) 50 MG tablet TAKE 1 TABLET (50 MG TOTAL) BY MOUTH DAILY.  90 tablet  3  . GINSENG PO Take 450 mg by mouth daily.       . indomethacin (INDOCIN) 25 MG capsule Take 1 capsule (25 mg total) by mouth 3 (three) times daily as needed.  90 capsule  0  . lisinopril (PRINIVIL,ZESTRIL) 20 MG tablet TAKE 1 TABLET (20 MG TOTAL) BY MOUTH DAILY.  90 tablet   5  . Multiple Vitamins-Minerals (CENTRUM SILVER PO) Take 1 tablet by mouth daily.       . Omega-3 Fatty Acids (FISH OIL) 1200 MG CAPS Take 1,200 mg by mouth daily.       . sildenafil (VIAGRA) 100 MG tablet Take 100 mg by mouth daily as needed. For erectile dysfynction       . St Johns Wort 300 MG CAPS Take 1 capsule by mouth daily.       Marland Kitchen DISCONTD: simvastatin (ZOCOR) 40 MG tablet Take 1 tablet (40 mg total) by mouth every evening.  90 tablet  6    BP 140/90  Pulse 70  Temp 98.2 F (36.8 C) (Oral)  Resp 16  Ht 5' 9.5" (1.765 m)  Wt 203 lb (92.08 kg)  BMI 29.55 kg/m2  SpO2 98%   Medicare wellness visit:   1. Risk factors, based on past M,S,F history- cardiovascular risk factors include hypertension and dyslipidemia as well as peripheral vascular disease 2. Physical activities: Remains active with gardening and housework although no regular exercise program. No exercise limitation  3. Depression/mood: History mild anxiety but no history of severe depression or mood disorder  4.  Hearing: No deficits  5. ADL's: Independent in all aspects of daily living  6. Fall risk: Low  7. Home safety: No problems identified  8. Height weight, and visual acuity; height and weight stable no change in visual acuity  9. Counseling: More regular exercise heart healthy diet encouraged  10. Lab orders based on risk factors: Laboratory studies reviewed we'll titrate statin therapy  11. Referral  followup vascular surgery  12. Care plan: Carotid artery Doppler study followup  13. Cognitive assessment: Alert and oriented normal affect. No cognitive dysfunction.       Review of Systems  Constitutional: Negative for fever, chills, activity change, appetite change and fatigue.  HENT: Negative for hearing loss, ear pain, congestion, rhinorrhea, sneezing, mouth sores, trouble swallowing, neck pain, neck stiffness, dental problem, voice change, sinus pressure and tinnitus.   Eyes: Negative for  photophobia, pain, redness and visual disturbance.  Respiratory: Negative for apnea, cough, choking, chest tightness, shortness of breath and wheezing.   Cardiovascular: Negative for chest pain, palpitations and leg swelling.  Gastrointestinal: Negative for nausea, vomiting, abdominal pain, diarrhea, constipation, blood in stool, abdominal distention, anal bleeding and rectal pain.  Genitourinary: Negative for dysuria, urgency, frequency, hematuria, flank pain, decreased urine volume, discharge, penile swelling, scrotal swelling, difficulty urinating, genital sores and testicular pain.  Musculoskeletal: Negative for myalgias, back pain, joint swelling, arthralgias and gait problem.  Skin: Negative for color change, rash and wound.  Neurological: Negative for dizziness, tremors, seizures, syncope, facial asymmetry, speech difficulty, weakness, light-headedness, numbness and headaches.  Hematological: Negative for adenopathy. Does not bruise/bleed easily.  Psychiatric/Behavioral: Negative for suicidal ideas, hallucinations, behavioral problems, confusion, disturbed wake/sleep cycle, self-injury, dysphoric mood, decreased concentration and agitation. The patient is not nervous/anxious.        Objective:   Physical Exam  Constitutional: He appears well-developed and well-nourished.  HENT:  Head: Normocephalic and atraumatic.  Right Ear: External ear normal.  Left Ear: External ear normal.  Nose: Nose normal.  Mouth/Throat: Oropharynx is clear and moist.       Upper dentures  Eyes: Conjunctivae normal and EOM are normal. Pupils are equal, round, and reactive to light. No scleral icterus.  Neck: Normal range of motion. Neck supple. No JVD present. No thyromegaly present.       Right carotid endarterectomy scar  Cardiovascular: Regular rhythm, normal heart sounds and intact distal pulses.  Exam reveals no gallop and no friction rub.   No murmur heard. Pulmonary/Chest: Effort normal and breath  sounds normal. He exhibits no tenderness.  Abdominal: Soft. Bowel sounds are normal. He exhibits no distension and no mass. There is no tenderness.  Genitourinary: Prostate normal and penis normal.  Musculoskeletal: Normal range of motion. He exhibits no edema and no tenderness.  Lymphadenopathy:    He has no cervical adenopathy.  Neurological: He is alert. He has normal reflexes. No cranial nerve deficit. Coordination normal.  Skin: Skin is warm and dry. No rash noted.  Psychiatric: He has a normal mood and affect. His behavior is normal.          Assessment & Plan:  Preventive health examination Dyslipidemia. Continue statin therapy Status post right CEA stable Hypertension  Recheck 6 months

## 2012-08-06 DIAGNOSIS — Z85820 Personal history of malignant melanoma of skin: Secondary | ICD-10-CM | POA: Diagnosis not present

## 2012-08-06 DIAGNOSIS — L570 Actinic keratosis: Secondary | ICD-10-CM | POA: Diagnosis not present

## 2012-11-05 ENCOUNTER — Ambulatory Visit (INDEPENDENT_AMBULATORY_CARE_PROVIDER_SITE_OTHER): Payer: Medicare Other | Admitting: Internal Medicine

## 2012-11-05 ENCOUNTER — Encounter: Payer: Self-pay | Admitting: Internal Medicine

## 2012-11-05 VITALS — BP 122/80 | HR 57 | Temp 98.5°F | Resp 18 | Wt 203.0 lb

## 2012-11-05 DIAGNOSIS — I10 Essential (primary) hypertension: Secondary | ICD-10-CM

## 2012-11-05 MED ORDER — ALPRAZOLAM 0.5 MG PO TABS: 0.5000 mg | ORAL_TABLET | Freq: Every evening | ORAL | Status: DC | PRN

## 2012-11-05 NOTE — Patient Instructions (Signed)
Limit your sodium (Salt) intake    It is important that you exercise regularly, at least 20 minutes 3 to 4 times per week.  If you develop chest pain or shortness of breath seek  medical attention.  Return in 6 months for follow-up  

## 2012-11-05 NOTE — Progress Notes (Signed)
Subjective:    Patient ID: Randall Mcgee, male    DOB: Nov 24, 1940, 72 y.o.   MRN: [id number]  HPI  71 year old patient who is seen today for followup of hypertension. He has treated dyslipidemia. He is doing quite well without concerns or complaints. No cardiopulmonary concerns.  Past Medical History  Diagnosis Date  . Hyperlipidemia   . Hypertension   . History of chicken pox     History   Social History  . Marital Status: Married    Spouse Name: N/A    Number of Children: N/A  . Years of Education: N/A   Occupational History  . Not on file.   Social History Main Topics  . Smoking status: Never Smoker   . Smokeless tobacco: Never Used  . Alcohol Use: No  . Drug Use: No  . Sexually Active: Not on file   Other Topics Concern  . Not on file   Social History Narrative  . No narrative on file    Past Surgical History  Procedure Laterality Date  . Cholecystectomy    . Tonsillectomy    . Tonsillectomy    . Endarterectomy  06/20/11  . Endarterectomy  06/20/2011    Procedure: ENDARTERECTOMY CAROTID;  Surgeon: Sherren Kerns, MD;  Location: [facility] OR;  Service: Vascular;  Laterality: Right;  Right Carotid endarterectomy with dacron patch angioplasty     Family History  Problem Relation Age of Onset  . Heart disease Mother     No Known Allergies  Current Outpatient Prescriptions on File Prior to Visit  Medication Sig Dispense Refill  . aspirin 325 MG tablet Take 325 mg by mouth daily.       Marland Kitchen atenolol (TENORMIN) 50 MG tablet Take 1 tablet (50 mg total) by mouth daily.  90 tablet  3  . GINSENG PO Take 450 mg by mouth daily.       . indomethacin (INDOCIN) 25 MG capsule Take 1 capsule (25 mg total) by mouth 3 (three) times daily as needed.  90 capsule  0  . lisinopril (PRINIVIL,ZESTRIL) 20 MG tablet Take 1 tablet (20 mg total) by mouth daily.  90 tablet  5  . Multiple Vitamins-Minerals (CENTRUM SILVER PO) Take 1 tablet by mouth daily.       . Omega-3 Fatty Acids (FISH  OIL) 1200 MG CAPS Take 1,200 mg by mouth daily.       . sildenafil (VIAGRA) 100 MG tablet Take 1 tablet (100 mg total) by mouth daily as needed. For erectile dysfynction  10 tablet  6  . simvastatin (ZOCOR) 40 MG tablet Take 1 tablet (40 mg total) by mouth every evening.  90 tablet  6  . St Johns Wort 300 MG CAPS Take 1 capsule by mouth daily.        No current facility-administered medications on file prior to visit.    BP 122/80  Pulse 57  Temp(Src) 98.5 F (36.9 C) (Oral)  Resp 18  Wt 203 lb (92.08 kg)  BMI 29.56 kg/m2  SpO2 95%       Review of Systems  Constitutional: Negative for fever, chills, appetite change and fatigue.  HENT: Negative for hearing loss, ear pain, congestion, sore throat, trouble swallowing, neck stiffness, dental problem, voice change and tinnitus.   Eyes: Negative for pain, discharge and visual disturbance.  Respiratory: Negative for cough, chest tightness, wheezing and stridor.   Cardiovascular: Negative for chest pain, palpitations and leg swelling.  Gastrointestinal: Negative for nausea, vomiting, abdominal  pain, diarrhea, constipation, blood in stool and abdominal distention.  Genitourinary: Negative for urgency, hematuria, flank pain, discharge, difficulty urinating and genital sores.  Musculoskeletal: Negative for myalgias, back pain, joint swelling, arthralgias and gait problem.  Skin: Negative for rash.  Neurological: Negative for dizziness, syncope, speech difficulty, weakness, numbness and headaches.  Hematological: Negative for adenopathy. Does not bruise/bleed easily.  Psychiatric/Behavioral: Negative for behavioral problems and dysphoric mood. The patient is not nervous/anxious.        Objective:   Physical Exam  Constitutional: He is oriented to person, place, and time. He appears well-developed.  HENT:  Head: Normocephalic.  Right Ear: External ear normal.  Left Ear: External ear normal.  Eyes: Conjunctivae and EOM are normal.   Neck: Normal range of motion.  Cardiovascular: Normal rate and normal heart sounds.   Pulmonary/Chest: Breath sounds normal.  Abdominal: Bowel sounds are normal.  Musculoskeletal: Normal range of motion. He exhibits no edema and no tenderness.  Neurological: He is alert and oriented to person, place, and time.  Psychiatric: He has a normal mood and affect. His behavior is normal.          Assessment & Plan:    Hypertension. Well controlled will continue present regimen. Blood pressure 120/80 Dyslipidemia. Continue simvastatin  CPX 6 months Eye exam recommended

## 2013-01-15 DIAGNOSIS — H2510 Age-related nuclear cataract, unspecified eye: Secondary | ICD-10-CM | POA: Diagnosis not present

## 2013-02-07 DIAGNOSIS — L570 Actinic keratosis: Secondary | ICD-10-CM | POA: Diagnosis not present

## 2013-02-07 DIAGNOSIS — D235 Other benign neoplasm of skin of trunk: Secondary | ICD-10-CM | POA: Diagnosis not present

## 2013-02-07 DIAGNOSIS — Z85820 Personal history of malignant melanoma of skin: Secondary | ICD-10-CM | POA: Diagnosis not present

## 2013-02-10 DIAGNOSIS — H2510 Age-related nuclear cataract, unspecified eye: Secondary | ICD-10-CM | POA: Diagnosis not present

## 2013-02-10 DIAGNOSIS — H269 Unspecified cataract: Secondary | ICD-10-CM | POA: Diagnosis not present

## 2013-02-21 DIAGNOSIS — H2510 Age-related nuclear cataract, unspecified eye: Secondary | ICD-10-CM | POA: Diagnosis not present

## 2013-02-24 DIAGNOSIS — H269 Unspecified cataract: Secondary | ICD-10-CM | POA: Diagnosis not present

## 2013-02-24 DIAGNOSIS — H2510 Age-related nuclear cataract, unspecified eye: Secondary | ICD-10-CM | POA: Diagnosis not present

## 2013-03-17 ENCOUNTER — Other Ambulatory Visit: Payer: Self-pay | Admitting: Internal Medicine

## 2013-04-23 ENCOUNTER — Other Ambulatory Visit: Payer: Self-pay | Admitting: Internal Medicine

## 2013-05-08 ENCOUNTER — Telehealth: Payer: Self-pay | Admitting: Internal Medicine

## 2013-05-08 ENCOUNTER — Other Ambulatory Visit (INDEPENDENT_AMBULATORY_CARE_PROVIDER_SITE_OTHER): Payer: Medicare Other

## 2013-05-08 DIAGNOSIS — I10 Essential (primary) hypertension: Secondary | ICD-10-CM | POA: Diagnosis not present

## 2013-05-08 DIAGNOSIS — E039 Hypothyroidism, unspecified: Secondary | ICD-10-CM | POA: Diagnosis not present

## 2013-05-08 DIAGNOSIS — E785 Hyperlipidemia, unspecified: Secondary | ICD-10-CM

## 2013-05-08 DIAGNOSIS — N400 Enlarged prostate without lower urinary tract symptoms: Secondary | ICD-10-CM

## 2013-05-08 DIAGNOSIS — R972 Elevated prostate specific antigen [PSA]: Secondary | ICD-10-CM

## 2013-05-08 LAB — BASIC METABOLIC PANEL
BUN: 19 mg/dL (ref 6–23)
CO2: 30 mEq/L (ref 19–32)
Calcium: 9.5 mg/dL (ref 8.4–10.5)
Chloride: 107 mEq/L (ref 96–112)
Creatinine, Ser: 1.2 mg/dL (ref 0.4–1.5)

## 2013-05-08 LAB — CBC WITH DIFFERENTIAL/PLATELET
Basophils Absolute: 0 10*3/uL (ref 0.0–0.1)
Eosinophils Absolute: 0.1 10*3/uL (ref 0.0–0.7)
Lymphocytes Relative: 37.2 % (ref 12.0–46.0)
MCHC: 34 g/dL (ref 30.0–36.0)
MCV: 97.1 fl (ref 78.0–100.0)
Monocytes Absolute: 0.8 10*3/uL (ref 0.1–1.0)
Neutrophils Relative %: 49 % (ref 43.0–77.0)
Platelets: 193 10*3/uL (ref 150.0–400.0)

## 2013-05-08 LAB — HEPATIC FUNCTION PANEL
Alkaline Phosphatase: 34 U/L — ABNORMAL LOW (ref 39–117)
Bilirubin, Direct: 0.1 mg/dL (ref 0.0–0.3)
Total Bilirubin: 0.5 mg/dL (ref 0.3–1.2)

## 2013-05-08 LAB — POCT URINALYSIS DIPSTICK
Bilirubin, UA: NEGATIVE
Glucose, UA: NEGATIVE
Leukocytes, UA: NEGATIVE
Nitrite, UA: NEGATIVE
pH, UA: 5

## 2013-05-08 LAB — LIPID PANEL
Cholesterol: 157 mg/dL (ref 0–200)
HDL: 40.9 mg/dL (ref >39.00)
Triglycerides: 74 mg/dL (ref 0.0–149.0)

## 2013-05-08 LAB — PSA: PSA: 2.59 ng/mL (ref 0.10–4.00)

## 2013-05-15 ENCOUNTER — Ambulatory Visit (INDEPENDENT_AMBULATORY_CARE_PROVIDER_SITE_OTHER): Payer: Medicare Other | Admitting: Internal Medicine

## 2013-05-15 ENCOUNTER — Encounter: Payer: Self-pay | Admitting: Internal Medicine

## 2013-05-15 VITALS — BP 130/80 | HR 61 | Temp 98.2°F | Resp 20 | Ht 69.0 in | Wt 205.0 lb

## 2013-05-15 DIAGNOSIS — Z23 Encounter for immunization: Secondary | ICD-10-CM

## 2013-05-15 DIAGNOSIS — Z0000 Encounter for general adult medical examination without abnormal findings: Secondary | ICD-10-CM | POA: Diagnosis not present

## 2013-05-15 DIAGNOSIS — E7800 Pure hypercholesterolemia, unspecified: Secondary | ICD-10-CM | POA: Diagnosis not present

## 2013-05-15 DIAGNOSIS — I6529 Occlusion and stenosis of unspecified carotid artery: Secondary | ICD-10-CM

## 2013-05-15 DIAGNOSIS — I10 Essential (primary) hypertension: Secondary | ICD-10-CM | POA: Diagnosis not present

## 2013-05-15 MED ORDER — INDOMETHACIN 25 MG PO CAPS: 25.0000 mg | ORAL_CAPSULE | Freq: Three times a day (TID) | ORAL | Status: DC | PRN

## 2013-05-15 MED ORDER — SIMVASTATIN 40 MG PO TABS: 40.0000 mg | ORAL_TABLET | Freq: Every evening | ORAL | Status: DC

## 2013-05-15 MED ORDER — LISINOPRIL 20 MG PO TABS: 20.0000 mg | ORAL_TABLET | Freq: Every day | ORAL | Status: DC

## 2013-05-15 MED ORDER — ATENOLOL 50 MG PO TABS: 50.0000 mg | ORAL_TABLET | Freq: Every day | ORAL | Status: DC

## 2013-05-15 NOTE — Patient Instructions (Signed)
Limit your sodium (Salt) intake    It is important that you exercise regularly, at least 20 minutes 3 to 4 times per week.  If you develop chest pain or shortness of breath seek  medical attention.  Return in one year for follow-up   

## 2013-05-15 NOTE — Progress Notes (Signed)
Patient ID: Randall Mcgee, male   DOB: [birth year], 72 y.o.   MRN: [id number]  Subjective:    Patient ID: Randall Mcgee, male    DOB: 06-23-1941, 72 y.o.   MRN: [id number]  HPI  38 -year-old patient who is seen today for a preventive health examination. He is status post right carotid endarterectomy and has done quite well. He has treated hypertension and dyslipidemia  Past Medical History  Diagnosis Date  . Hyperlipidemia   . Hypertension   . History of chicken pox     History   Social History  . Marital Status: Married    Spouse Name: N/A    Number of Children: N/A  . Years of Education: N/A   Occupational History  . Not on file.   Social History Main Topics  . Smoking status: Never Smoker   . Smokeless tobacco: Never Used  . Alcohol Use: No  . Drug Use: No  . Sexual Activity: Not on file   Other Topics Concern  . Not on file   Social History Narrative  . No narrative on file    Past Surgical History  Procedure Laterality Date  . Cholecystectomy    . Tonsillectomy    . Tonsillectomy    . Endarterectomy  06/20/11  . Endarterectomy  06/20/2011    Procedure: ENDARTERECTOMY CAROTID;  Surgeon: Sherren Kerns, MD;  Location: [facility] OR;  Service: Vascular;  Laterality: Right;  Right Carotid endarterectomy with dacron patch angioplasty     Family History  Problem Relation Age of Onset  . Heart disease Mother     No Known Allergies  Current Outpatient Prescriptions on File Prior to Visit  Medication Sig Dispense Refill  . ALPRAZolam (XANAX) 0.5 MG tablet TAKE 1 TABLET BY MOUTH EVERY NIGHT AT BEDTIME AS NEEDED FOR ANXIETY  60 tablet  1  . aspirin 325 MG tablet Take 325 mg by mouth daily.       Marland Kitchen atenolol (TENORMIN) 50 MG tablet Take 1 tablet (50 mg total) by mouth daily.  90 tablet  3  . GINSENG PO Take 450 mg by mouth daily.       . indomethacin (INDOCIN) 25 MG capsule Take 1 capsule (25 mg total) by mouth 3 (three) times daily as needed.  90 capsule  0  .  lisinopril (PRINIVIL,ZESTRIL) 20 MG tablet Take 1 tablet (20 mg total) by mouth daily.  90 tablet  5  . Multiple Vitamins-Minerals (CENTRUM SILVER PO) Take 1 tablet by mouth daily.       . Omega-3 Fatty Acids (FISH OIL) 1200 MG CAPS Take 1,200 mg by mouth daily.       . sildenafil (VIAGRA) 100 MG tablet Take 1 tablet (100 mg total) by mouth daily as needed. For erectile dysfynction  10 tablet  6  . simvastatin (ZOCOR) 40 MG tablet Take 1 tablet (40 mg total) by mouth every evening.  90 tablet  6  . St Johns Wort 300 MG CAPS Take 1 capsule by mouth daily.        No current facility-administered medications on file prior to visit.    BP 130/80  Pulse 61  Temp(Src) 98.2 F (36.8 C) (Oral)  Resp 20  Ht 5\' 9"  (1.753 m)  Wt 205 lb (92.987 kg)  BMI 30.26 kg/m2  SpO2 98%   Medicare wellness visit:   1. Risk factors, based on past M,S,F history- cardiovascular risk factors include hypertension and dyslipidemia as well as peripheral  vascular disease 2. Physical activities: Remains active with gardening and housework although no regular exercise program. No exercise limitation  3. Depression/mood: History mild anxiety but no history of severe depression or mood disorder  4. Hearing: No deficits  5. ADL's: Independent in all aspects of daily living  6. Fall risk: Low  7. Home safety: No problems identified  8. Height weight, and visual acuity; height and weight stable no change in visual acuity  9. Counseling: More regular exercise heart healthy diet encouraged  10. Lab orders based on risk factors: Laboratory studies reviewed we'll titrate statin therapy  11. Referral  followup vascular surgery  12. Care plan: Carotid artery Doppler study followup  13. Cognitive assessment: Alert and oriented normal affect. No cognitive dysfunction.       Review of Systems  Constitutional: Negative for fever, chills, activity change, appetite change and fatigue.  HENT: Negative for congestion, dental  problem, ear pain, hearing loss, mouth sores, rhinorrhea, sinus pressure, sneezing, tinnitus, trouble swallowing and voice change.   Eyes: Negative for photophobia, pain, redness and visual disturbance.  Respiratory: Negative for apnea, cough, choking, chest tightness, shortness of breath and wheezing.   Cardiovascular: Negative for chest pain, palpitations and leg swelling.  Gastrointestinal: Negative for nausea, vomiting, abdominal pain, diarrhea, constipation, blood in stool, abdominal distention, anal bleeding and rectal pain.  Genitourinary: Negative for dysuria, urgency, frequency, hematuria, flank pain, decreased urine volume, discharge, penile swelling, scrotal swelling, difficulty urinating, genital sores and testicular pain.  Musculoskeletal: Negative for arthralgias, back pain, gait problem, joint swelling, myalgias, neck pain and neck stiffness.  Skin: Negative for color change, rash and wound.  Neurological: Negative for dizziness, tremors, seizures, syncope, facial asymmetry, speech difficulty, weakness, light-headedness, numbness and headaches.  Hematological: Negative for adenopathy. Does not bruise/bleed easily.  Psychiatric/Behavioral: Negative for suicidal ideas, hallucinations, behavioral problems, confusion, sleep disturbance, self-injury, dysphoric mood, decreased concentration and agitation. The patient is not nervous/anxious.        Objective:   Physical Exam  Constitutional: He appears well-developed and well-nourished.  HENT:  Head: Normocephalic and atraumatic.  Right Ear: External ear normal.  Left Ear: External ear normal.  Nose: Nose normal.  Mouth/Throat: Oropharynx is clear and moist.  Upper dentures  Eyes: Conjunctivae and EOM are normal. Pupils are equal, round, and reactive to light. No scleral icterus.  Neck: Normal range of motion. Neck supple. No JVD present. No thyromegaly present.  Right carotid endarterectomy scar  Cardiovascular: Regular rhythm,  normal heart sounds and intact distal pulses.  Exam reveals no gallop and no friction rub.   No murmur heard. Pulmonary/Chest: Effort normal and breath sounds normal. He exhibits no tenderness.  Abdominal: Soft. Bowel sounds are normal. He exhibits no distension and no mass. There is no tenderness.  Genitourinary: Prostate normal and penis normal.  Musculoskeletal: Normal range of motion. He exhibits no edema and no tenderness.  Lymphadenopathy:    He has no cervical adenopathy.  Neurological: He is alert. He has normal reflexes. No cranial nerve deficit. Coordination normal.  Skin: Skin is warm and dry. No rash noted.  Psychiatric: He has a normal mood and affect. His behavior is normal.          Assessment & Plan:  Preventive health examination Dyslipidemia. Continue statin therapy Status post right CEA stable Hypertension  Followup colonoscopy discussed it is that approximately 10 years. He wishes to defer and just check stool for occult blood annually  Recheck in one year

## 2013-05-20 ENCOUNTER — Ambulatory Visit (HOSPITAL_COMMUNITY): Payer: Medicare Other | Attending: Cardiology

## 2013-05-20 DIAGNOSIS — Z87891 Personal history of nicotine dependence: Secondary | ICD-10-CM | POA: Diagnosis not present

## 2013-05-20 DIAGNOSIS — I6529 Occlusion and stenosis of unspecified carotid artery: Secondary | ICD-10-CM | POA: Insufficient documentation

## 2013-05-20 DIAGNOSIS — E785 Hyperlipidemia, unspecified: Secondary | ICD-10-CM | POA: Insufficient documentation

## 2013-05-20 DIAGNOSIS — I658 Occlusion and stenosis of other precerebral arteries: Secondary | ICD-10-CM | POA: Insufficient documentation

## 2013-05-20 DIAGNOSIS — I10 Essential (primary) hypertension: Secondary | ICD-10-CM | POA: Diagnosis not present

## 2013-06-06 NOTE — Telephone Encounter (Signed)
Close encounter 

## 2013-08-08 ENCOUNTER — Other Ambulatory Visit: Payer: Self-pay | Admitting: Physician Assistant

## 2013-08-08 DIAGNOSIS — L570 Actinic keratosis: Secondary | ICD-10-CM | POA: Diagnosis not present

## 2013-08-08 DIAGNOSIS — D1801 Hemangioma of skin and subcutaneous tissue: Secondary | ICD-10-CM | POA: Diagnosis not present

## 2013-08-08 DIAGNOSIS — D235 Other benign neoplasm of skin of trunk: Secondary | ICD-10-CM | POA: Diagnosis not present

## 2013-08-08 DIAGNOSIS — L821 Other seborrheic keratosis: Secondary | ICD-10-CM | POA: Diagnosis not present

## 2013-08-08 DIAGNOSIS — L819 Disorder of pigmentation, unspecified: Secondary | ICD-10-CM | POA: Diagnosis not present

## 2013-08-08 DIAGNOSIS — C44319 Basal cell carcinoma of skin of other parts of face: Secondary | ICD-10-CM | POA: Diagnosis not present

## 2013-08-18 ENCOUNTER — Other Ambulatory Visit: Payer: Self-pay | Admitting: Internal Medicine

## 2013-10-17 ENCOUNTER — Other Ambulatory Visit: Payer: Self-pay | Admitting: Internal Medicine

## 2013-10-20 NOTE — Telephone Encounter (Signed)
Pt is out °

## 2014-03-24 ENCOUNTER — Other Ambulatory Visit: Payer: Self-pay | Admitting: Physician Assistant

## 2014-03-24 DIAGNOSIS — Z85820 Personal history of malignant melanoma of skin: Secondary | ICD-10-CM | POA: Diagnosis not present

## 2014-03-24 DIAGNOSIS — D235 Other benign neoplasm of skin of trunk: Secondary | ICD-10-CM | POA: Diagnosis not present

## 2014-03-24 DIAGNOSIS — L570 Actinic keratosis: Secondary | ICD-10-CM | POA: Diagnosis not present

## 2014-03-24 DIAGNOSIS — L821 Other seborrheic keratosis: Secondary | ICD-10-CM | POA: Diagnosis not present

## 2014-03-24 DIAGNOSIS — Z85828 Personal history of other malignant neoplasm of skin: Secondary | ICD-10-CM | POA: Diagnosis not present

## 2014-03-27 DIAGNOSIS — L570 Actinic keratosis: Secondary | ICD-10-CM | POA: Diagnosis not present

## 2014-05-18 ENCOUNTER — Telehealth: Payer: Self-pay | Admitting: Internal Medicine

## 2014-05-18 NOTE — Telephone Encounter (Signed)
[id number] - [place], [place] - [phone] AT [place] is requesting re-fill on simvastatin (ZOCOR) 40 MG tablet

## 2014-05-19 ENCOUNTER — Ambulatory Visit (INDEPENDENT_AMBULATORY_CARE_PROVIDER_SITE_OTHER): Payer: Medicare Other | Admitting: Internal Medicine

## 2014-05-19 ENCOUNTER — Other Ambulatory Visit: Payer: Self-pay | Admitting: Internal Medicine

## 2014-05-19 ENCOUNTER — Encounter: Payer: Self-pay | Admitting: Internal Medicine

## 2014-05-19 VITALS — BP 130/76 | HR 69 | Temp 98.0°F | Resp 20 | Ht 69.0 in | Wt 208.0 lb

## 2014-05-19 DIAGNOSIS — Z23 Encounter for immunization: Secondary | ICD-10-CM | POA: Diagnosis not present

## 2014-05-19 DIAGNOSIS — E7800 Pure hypercholesterolemia, unspecified: Secondary | ICD-10-CM

## 2014-05-19 DIAGNOSIS — I6529 Occlusion and stenosis of unspecified carotid artery: Secondary | ICD-10-CM

## 2014-05-19 DIAGNOSIS — I6521 Occlusion and stenosis of right carotid artery: Secondary | ICD-10-CM

## 2014-05-19 DIAGNOSIS — I10 Essential (primary) hypertension: Secondary | ICD-10-CM | POA: Diagnosis not present

## 2014-05-19 DIAGNOSIS — Z0000 Encounter for general adult medical examination without abnormal findings: Secondary | ICD-10-CM | POA: Diagnosis not present

## 2014-05-19 LAB — CBC WITH DIFFERENTIAL/PLATELET
BASOS PCT: 0.4 % (ref 0.0–3.0)
Basophils Absolute: 0 10*3/uL (ref 0.0–0.1)
EOS PCT: 1 % (ref 0.0–5.0)
Eosinophils Absolute: 0.1 10*3/uL (ref 0.0–0.7)
HCT: 41.2 % (ref 39.0–52.0)
HEMOGLOBIN: 13.6 g/dL (ref 13.0–17.0)
LYMPHS PCT: 36.1 % (ref 12.0–46.0)
Lymphs Abs: 2.5 10*3/uL (ref 0.7–4.0)
MCHC: 33.1 g/dL (ref 30.0–36.0)
MCV: 98.4 fl (ref 78.0–100.0)
Monocytes Absolute: 0.8 10*3/uL (ref 0.1–1.0)
Monocytes Relative: 12 % (ref 3.0–12.0)
NEUTROS ABS: 3.5 10*3/uL (ref 1.4–7.7)
NEUTROS PCT: 50.5 % (ref 43.0–77.0)
Platelets: 192 10*3/uL (ref 150.0–400.0)
RBC: 4.19 Mil/uL — AB (ref 4.22–5.81)
RDW: 13.6 % (ref 11.5–15.5)
WBC: 6.8 10*3/uL (ref 4.0–10.5)

## 2014-05-19 LAB — POCT URINALYSIS DIPSTICK
Bilirubin, UA: NEGATIVE
GLUCOSE UA: NEGATIVE
Ketones, UA: NEGATIVE
Leukocytes, UA: NEGATIVE
Nitrite, UA: NEGATIVE
Protein, UA: NEGATIVE
SPEC GRAV UA: 1.015
UROBILINOGEN UA: 0.2
pH, UA: 5

## 2014-05-19 LAB — COMPREHENSIVE METABOLIC PANEL
ALBUMIN: 3.6 g/dL (ref 3.5–5.2)
ALT: 21 U/L (ref 0–53)
AST: 40 U/L — AB (ref 0–37)
Alkaline Phosphatase: 36 U/L — ABNORMAL LOW (ref 39–117)
BUN: 17 mg/dL (ref 6–23)
CO2: 28 mEq/L (ref 19–32)
CREATININE: 1.2 mg/dL (ref 0.4–1.5)
Calcium: 9.6 mg/dL (ref 8.4–10.5)
Chloride: 107 mEq/L (ref 96–112)
GFR: 64.48 mL/min (ref >60.00)
GLUCOSE: 101 mg/dL — AB (ref 70–99)
POTASSIUM: 4.4 meq/L (ref 3.5–5.1)
Sodium: 141 mEq/L (ref 135–145)
Total Bilirubin: 0.7 mg/dL (ref 0.2–1.2)
Total Protein: 7.3 g/dL (ref 6.0–8.3)

## 2014-05-19 LAB — LIPID PANEL
CHOLESTEROL: 159 mg/dL (ref 0–200)
HDL: 38.3 mg/dL — ABNORMAL LOW (ref >39.00)
LDL CALC: 108 mg/dL — AB (ref 0–99)
NonHDL: 120.7
TRIGLYCERIDES: 63 mg/dL (ref 0.0–149.0)
Total CHOL/HDL Ratio: 4
VLDL: 12.6 mg/dL (ref 0.0–40.0)

## 2014-05-19 LAB — TSH: TSH: 1.91 u[IU]/mL (ref 0.35–4.50)

## 2014-05-19 MED ORDER — SIMVASTATIN 40 MG PO TABS: 40.0000 mg | ORAL_TABLET | Freq: Every evening | ORAL | Status: DC

## 2014-05-19 MED ORDER — TADALAFIL 5 MG PO TABS: 5.0000 mg | ORAL_TABLET | Freq: Every day | ORAL | Status: DC | PRN

## 2014-05-19 MED ORDER — ALPRAZOLAM 0.5 MG PO TABS: ORAL_TABLET | ORAL | Status: DC

## 2014-05-19 MED ORDER — LISINOPRIL 20 MG PO TABS: 20.0000 mg | ORAL_TABLET | Freq: Every day | ORAL | Status: DC

## 2014-05-19 MED ORDER — ATENOLOL 50 MG PO TABS: 50.0000 mg | ORAL_TABLET | Freq: Every day | ORAL | Status: DC

## 2014-05-19 NOTE — Telephone Encounter (Signed)
Rx sent 

## 2014-05-19 NOTE — Progress Notes (Signed)
Patient ID: Randall Mcgee, male   DOB: [birth year], 73 y.o.   MRN: [id number]  Subjective:    Patient ID: Randall Mcgee, male    DOB: 1941-04-15, 73 y.o.   MRN: [id number]  HPI 73 year-old patient who is seen today for a preventive health examination.  He is status post right carotid endarterectomy and has done quite well. He has treated hypertension and dyslipidemia  Past Medical History  Diagnosis Date  . Hyperlipidemia   . Hypertension   . History of chicken pox     History   Social History  . Marital Status: Married    Spouse Name: N/A    Number of Children: N/A  . Years of Education: N/A   Occupational History  . Not on file.   Social History Main Topics  . Smoking status: Never Smoker   . Smokeless tobacco: Never Used  . Alcohol Use: No  . Drug Use: No  . Sexual Activity: Not on file   Other Topics Concern  . Not on file   Social History Narrative  . No narrative on file    Past Surgical History  Procedure Laterality Date  . Cholecystectomy    . Tonsillectomy    . Tonsillectomy    . Endarterectomy  06/20/11  . Endarterectomy  06/20/2011    Procedure: ENDARTERECTOMY CAROTID;  Surgeon: Randall Dutch, MD;  Location: [facility] OR;  Service: Vascular;  Laterality: Right;  Right Carotid endarterectomy with dacron patch angioplasty     Family History  Problem Relation Age of Onset  . Heart disease Mother     No Known Allergies  Current Outpatient Prescriptions on File Prior to Visit  Medication Sig Dispense Refill  . ALPRAZolam (XANAX) 0.5 MG tablet TAKE 1 TABLET BY MOUTH AT BEDTIME AS NEEDED FOR SLEEP  60 tablet  3  . aspirin 325 MG tablet Take 325 mg by mouth daily.       Marland Kitchen atenolol (TENORMIN) 50 MG tablet Take 1 tablet (50 mg total) by mouth daily.  90 tablet  3  . GINSENG PO Take 450 mg by mouth daily.       . indomethacin (INDOCIN) 25 MG capsule Take 1 capsule (25 mg total) by mouth 3 (three) times daily as needed.  90 capsule  1  . lisinopril  (PRINIVIL,ZESTRIL) 20 MG tablet Take 1 tablet (20 mg total) by mouth daily.  90 tablet  3  . Multiple Vitamins-Minerals (CENTRUM SILVER PO) Take 1 tablet by mouth daily.       . Omega-3 Fatty Acids (FISH OIL) 1200 MG CAPS Take 1,200 mg by mouth daily.       . sildenafil (VIAGRA) 100 MG tablet Take 1 tablet (100 mg total) by mouth daily as needed. For erectile dysfynction  10 tablet  6  . simvastatin (ZOCOR) 40 MG tablet Take 1 tablet (40 mg total) by mouth every evening.  90 tablet  3  . St Johns Wort 300 MG CAPS Take 1 capsule by mouth daily.        No current facility-administered medications on file prior to visit.    BP 130/76  Pulse 69  Temp(Src) 98 F (36.7 C) (Oral)  Resp 20  Ht 5\' 9"  (1.753 m)  Wt 208 lb (94.348 kg)  BMI 30.70 kg/m2  SpO2 97%   Medicare wellness visit:   1. Risk factors, based on past M,S,F history- cardiovascular risk factors include hypertension and dyslipidemia as well as peripheral vascular disease  2. Physical activities: Remains active with gardening and housework although no regular exercise program. No exercise limitation  3. Depression/mood: History mild anxiety but no history of severe depression or mood disorder  4. Hearing: No deficits  5. ADL's: Independent in all aspects of daily living  6. Fall risk: Low  7. Home safety: No problems identified  8. Height weight, and visual acuity; height and weight stable no change in visual acuity  9. Counseling: More regular exercise heart healthy diet encouraged  10. Lab orders based on risk factors: Laboratory studies reviewed we'll titrate statin therapy  11. Referral  followup vascular surgery  12. Care plan: Carotid artery Doppler study followup  13. Cognitive assessment: Alert and oriented normal affect. No cognitive dysfunction.  14.  Preventive services will include annual clinical exam with screening lab with special attention to his lipid profile.  Will maintain blood pressure control.  Will  check serial carotid artery Doppler studies.  Will consider follow-up colonoscopy.  Stool for occult blood negative.  Today   Patient was provided with a written and personalized care plan  15.  Provider list includes primary care radiology and GI.       Review of Systems  Constitutional: Negative for fever, chills, activity change, appetite change and fatigue.  HENT: Negative for congestion, dental problem, ear pain, hearing loss, mouth sores, rhinorrhea, sinus pressure, sneezing, tinnitus, trouble swallowing and voice change.   Eyes: Negative for photophobia, pain, redness and visual disturbance.  Respiratory: Negative for apnea, cough, choking, chest tightness, shortness of breath and wheezing.   Cardiovascular: Negative for chest pain, palpitations and leg swelling.  Gastrointestinal: Negative for nausea, vomiting, abdominal pain, diarrhea, constipation, blood in stool, abdominal distention, anal bleeding and rectal pain.  Genitourinary: Negative for dysuria, urgency, frequency, hematuria, flank pain, decreased urine volume, discharge, penile swelling, scrotal swelling, difficulty urinating, genital sores and testicular pain.  Musculoskeletal: Negative for arthralgias, back pain, gait problem, joint swelling, myalgias, neck pain and neck stiffness.  Skin: Negative for color change, rash and wound.  Neurological: Negative for dizziness, tremors, seizures, syncope, facial asymmetry, speech difficulty, weakness, light-headedness, numbness and headaches.  Hematological: Negative for adenopathy. Does not bruise/bleed easily.  Psychiatric/Behavioral: Negative for suicidal ideas, hallucinations, behavioral problems, confusion, sleep disturbance, self-injury, dysphoric mood, decreased concentration and agitation. The patient is not nervous/anxious.        Objective:   Physical Exam  Constitutional: He appears well-developed and well-nourished.  HENT:  Head: Normocephalic and atraumatic.   Right Ear: External ear normal.  Left Ear: External ear normal.  Nose: Nose normal.  Mouth/Throat: Oropharynx is clear and moist.  Upper dentures  Eyes: Conjunctivae and EOM are normal. Pupils are equal, round, and reactive to light. No scleral icterus.  Neck: Normal range of motion. Neck supple. No JVD present. No thyromegaly present.  Right carotid endarterectomy scar  Cardiovascular: Regular rhythm, normal heart sounds and intact distal pulses.  Exam reveals no gallop and no friction rub.   No murmur heard. Pulmonary/Chest: Effort normal and breath sounds normal. He exhibits no tenderness.  Abdominal: Soft. Bowel sounds are normal. He exhibits no distension and no mass. There is no tenderness.  Prominent easily reducible right inguinal hernia  Genitourinary: Prostate normal and penis normal.  Musculoskeletal: Normal range of motion. He exhibits no edema and no tenderness.  Lymphadenopathy:    He has no cervical adenopathy.  Neurological: He is alert. He has normal reflexes. No cranial nerve deficit. Coordination normal.  Skin: Skin is  warm and dry. No rash noted.  Psychiatric: He has a normal mood and affect. His behavior is normal.          Assessment & Plan:  Preventive health examination Dyslipidemia. Continue statin therapy Status post right CEA stable.  Will check a follow-up carotid artery Doppler study Hypertension Right inguinal hernia   Followup colonoscopy discussed it is that approximately 10 years. He wishes to defer and just check stool for occult blood annually  Recheck in one year

## 2014-05-19 NOTE — Patient Instructions (Signed)
It is important that you exercise regularly, at least 20 minutes 3 to 4 times per week.  If you develop chest pain or shortness of breath seek  medical attention.  Follow-up carotid artery Doppler study  Health Maintenance A healthy lifestyle and preventative care can promote health and wellness.  Maintain regular health, dental, and eye exams.  Eat a healthy diet. Foods like vegetables, fruits, whole grains, low-fat dairy products, and lean protein foods contain the nutrients you need and are low in calories. Decrease your intake of foods high in solid fats, added sugars, and salt. Get information about a proper diet from your health care provider, if necessary.  Regular physical exercise is one of the most important things you can do for your health. Most adults should get at least 150 minutes of moderate-intensity exercise (any activity that increases your heart rate and causes you to sweat) each week. In addition, most adults need muscle-strengthening exercises on 2 or more days a week.   Maintain a healthy weight. The body mass index (BMI) is a screening tool to identify possible weight problems. It provides an estimate of body fat based on height and weight. Your health care provider can find your BMI and can help you achieve or maintain a healthy weight. For males 20 years and older:  A BMI below 18.5 is considered underweight.  A BMI of 18.5 to 24.9 is normal.  A BMI of 25 to 29.9 is considered overweight.  A BMI of 30 and above is considered obese.  Maintain normal blood lipids and cholesterol by exercising and minimizing your intake of saturated fat. Eat a balanced diet with plenty of fruits and vegetables. Blood tests for lipids and cholesterol should begin at age 8 and be repeated every 5 years. If your lipid or cholesterol levels are high, you are over age 63, or you are at high risk for heart disease, you may need your cholesterol levels checked more frequently.Ongoing high  lipid and cholesterol levels should be treated with medicines if diet and exercise are not working.  If you smoke, find out from your health care provider how to quit. If you do not use tobacco, do not start.  Lung cancer screening is recommended for adults aged 24-80 years who are at high risk for developing lung cancer because of a history of smoking. A yearly low-dose CT scan of the lungs is recommended for people who have at least a 30-pack-year history of smoking and are current smokers or have quit within the past 15 years. A pack year of smoking is smoking an average of 1 pack of cigarettes a day for 1 year (for example, a 30-pack-year history of smoking could mean smoking 1 pack a day for 30 years or 2 packs a day for 15 years). Yearly screening should continue until the smoker has stopped smoking for at least 15 years. Yearly screening should be stopped for people who develop a health problem that would prevent them from having lung cancer treatment.  If you choose to drink alcohol, do not have more than 2 drinks per day. One drink is considered to be 12 oz (360 mL) of beer, 5 oz (150 mL) of wine, or 1.5 oz (45 mL) of liquor.  Avoid the use of street drugs. Do not share needles with anyone. Ask for help if you need support or instructions about stopping the use of drugs.  High blood pressure causes heart disease and increases the risk of stroke. Blood pressure  should be checked at least every 1-2 years. Ongoing high blood pressure should be treated with medicines if weight loss and exercise are not effective.  If you are 35-48 years old, ask your health care provider if you should take aspirin to prevent heart disease.  Diabetes screening involves taking a blood sample to check your fasting blood sugar level. This should be done once every 3 years after age 3 if you are at a normal weight and without risk factors for diabetes. Testing should be considered at a younger age or be carried out  more frequently if you are overweight and have at least 1 risk factor for diabetes.  Colorectal cancer can be detected and often prevented. Most routine colorectal cancer screening begins at the age of 26 and continues through age 63. However, your health care provider may recommend screening at an earlier age if you have risk factors for colon cancer. On a yearly basis, your health care provider may provide home test kits to check for hidden blood in the stool. A small camera at the end of a tube may be used to directly examine the colon (sigmoidoscopy or colonoscopy) to detect the earliest forms of colorectal cancer. Talk to your health care provider about this at age 34 when routine screening begins. A direct exam of the colon should be repeated every 5-10 years through age 31, unless early forms of precancerous polyps or small growths are found.  People who are at an increased risk for hepatitis B should be screened for this virus. You are considered at high risk for hepatitis B if:  You were born in a country where hepatitis B occurs often. Talk with your health care provider about which countries are considered high risk.  Your parents were born in a high-risk country and you have not received a shot to protect against hepatitis B (hepatitis B vaccine).  You have HIV or AIDS.  You use needles to inject street drugs.  You live with, or have sex with, someone who has hepatitis B.  You are a man who has sex with other men (MSM).  You get hemodialysis treatment.  You take certain medicines for conditions like cancer, organ transplantation, and autoimmune conditions.  Hepatitis C blood testing is recommended for all people born from [birth year] through 1965 and any individual with known risk factors for hepatitis C.  Healthy men should no longer receive prostate-specific antigen (PSA) blood tests as part of routine cancer screening. Talk to your health care provider about prostate cancer  screening.  Testicular cancer screening is not recommended for adolescents or adult males who have no symptoms. Screening includes self-exam, a health care provider exam, and other screening tests. Consult with your health care provider about any symptoms you have or any concerns you have about testicular cancer.  Practice safe sex. Use condoms and avoid high-risk sexual practices to reduce the spread of sexually transmitted infections (STIs).  You should be screened for STIs, including gonorrhea and chlamydia if:  You are sexually active and are younger than 24 years.  You are older than 24 years, and your health care provider tells you that you are at risk for this type of infection.  Your sexual activity has changed since you were last screened, and you are at an increased risk for chlamydia or gonorrhea. Ask your health care provider if you are at risk.  If you are at risk of being infected with HIV, it is recommended that you take a  prescription medicine daily to prevent HIV infection. This is called pre-exposure prophylaxis (PrEP). You are considered at risk if:  You are a man who has sex with other men (MSM).  You are a heterosexual man who is sexually active with multiple partners.  You take drugs by injection.  You are sexually active with a partner who has HIV.  Talk with your health care provider about whether you are at high risk of being infected with HIV. If you choose to begin PrEP, you should first be tested for HIV. You should then be tested every 3 months for as long as you are taking PrEP.  Use sunscreen. Apply sunscreen liberally and repeatedly throughout the day. You should seek shade when your shadow is shorter than you. Protect yourself by wearing long sleeves, pants, a wide-brimmed hat, and sunglasses year round whenever you are outdoors.  Tell your health care provider of new moles or changes in moles, especially if there is a change in shape or color. Also, tell  your health care provider if a mole is larger than the size of a pencil eraser.  A one-time screening for abdominal aortic aneurysm (AAA) and surgical repair of large AAAs by ultrasound is recommended for men aged 65-75 years who are current or former smokers.  Stay current with your vaccines (immunizations). Document Released: 01/06/2008 Document Revised: 07/15/2013 Document Reviewed: 12/05/2010 Exit[employer] Patient Information 2015 [employer]. This information is not intended to replace advice given to you by your health care provider. Make sure you discuss any questions you have with your health care provider.  

## 2014-05-20 ENCOUNTER — Telehealth: Payer: Self-pay | Admitting: Internal Medicine

## 2014-05-20 NOTE — Telephone Encounter (Signed)
emmi emailed °

## 2014-06-21 ENCOUNTER — Other Ambulatory Visit: Payer: Self-pay | Admitting: Internal Medicine

## 2014-11-30 ENCOUNTER — Telehealth: Payer: Self-pay

## 2014-11-30 ENCOUNTER — Telehealth: Payer: Self-pay | Admitting: Internal Medicine

## 2014-11-30 MED ORDER — ALPRAZOLAM 0.5 MG PO TABS: ORAL_TABLET | ORAL | Status: DC

## 2014-11-30 NOTE — Telephone Encounter (Signed)
° ° °  Pharmacy looking at wrong pt file nothing needed for this pt

## 2014-11-30 NOTE — Telephone Encounter (Signed)
[facility] request for ALPRAZolam (XANAX) 0.5 MG tablet

## 2014-11-30 NOTE — Telephone Encounter (Signed)
Rx called in to pharmacy. 

## 2014-12-01 ENCOUNTER — Telehealth: Payer: Self-pay

## 2014-12-01 MED ORDER — INDOMETHACIN 25 MG PO CAPS: 25.0000 mg | ORAL_CAPSULE | Freq: Three times a day (TID) | ORAL | Status: DC | PRN

## 2014-12-01 NOTE — Telephone Encounter (Signed)
Rx sent 

## 2014-12-01 NOTE — Telephone Encounter (Signed)
[id number] - [place], [place] - [address] AT [address]: indomethacin (INDOCIN) 25 MG capsule

## 2015-01-07 DIAGNOSIS — L814 Other melanin hyperpigmentation: Secondary | ICD-10-CM | POA: Diagnosis not present

## 2015-01-07 DIAGNOSIS — L570 Actinic keratosis: Secondary | ICD-10-CM | POA: Diagnosis not present

## 2015-01-07 DIAGNOSIS — D225 Melanocytic nevi of trunk: Secondary | ICD-10-CM | POA: Diagnosis not present

## 2015-01-07 DIAGNOSIS — L821 Other seborrheic keratosis: Secondary | ICD-10-CM | POA: Diagnosis not present

## 2015-02-17 DIAGNOSIS — H26493 Other secondary cataract, bilateral: Secondary | ICD-10-CM | POA: Diagnosis not present

## 2015-02-17 DIAGNOSIS — H524 Presbyopia: Secondary | ICD-10-CM | POA: Diagnosis not present

## 2015-05-13 ENCOUNTER — Other Ambulatory Visit: Payer: Self-pay | Admitting: Internal Medicine

## 2015-05-18 ENCOUNTER — Other Ambulatory Visit: Payer: Self-pay | Admitting: Internal Medicine

## 2015-06-10 ENCOUNTER — Other Ambulatory Visit: Payer: Self-pay | Admitting: Internal Medicine

## 2015-07-20 ENCOUNTER — Other Ambulatory Visit (INDEPENDENT_AMBULATORY_CARE_PROVIDER_SITE_OTHER): Payer: Medicare Other

## 2015-07-20 DIAGNOSIS — D649 Anemia, unspecified: Secondary | ICD-10-CM

## 2015-07-20 DIAGNOSIS — Z0000 Encounter for general adult medical examination without abnormal findings: Secondary | ICD-10-CM

## 2015-07-20 DIAGNOSIS — E785 Hyperlipidemia, unspecified: Secondary | ICD-10-CM

## 2015-07-20 DIAGNOSIS — R972 Elevated prostate specific antigen [PSA]: Secondary | ICD-10-CM

## 2015-07-20 DIAGNOSIS — E039 Hypothyroidism, unspecified: Secondary | ICD-10-CM | POA: Diagnosis not present

## 2015-07-20 DIAGNOSIS — Z125 Encounter for screening for malignant neoplasm of prostate: Secondary | ICD-10-CM

## 2015-07-20 DIAGNOSIS — I519 Heart disease, unspecified: Secondary | ICD-10-CM

## 2015-07-20 LAB — PSA: PSA: 3.17 ng/mL (ref 0.10–4.00)

## 2015-07-20 LAB — CBC WITH DIFFERENTIAL/PLATELET
BASOS PCT: 0.6 % (ref 0.0–3.0)
Basophils Absolute: 0 10*3/uL (ref 0.0–0.1)
EOS PCT: 1.3 % (ref 0.0–5.0)
Eosinophils Absolute: 0.1 10*3/uL (ref 0.0–0.7)
HEMATOCRIT: 40.7 % (ref 39.0–52.0)
HEMOGLOBIN: 13.4 g/dL (ref 13.0–17.0)
Lymphocytes Relative: 41.4 % (ref 12.0–46.0)
Lymphs Abs: 2.8 10*3/uL (ref 0.7–4.0)
MCHC: 33 g/dL (ref 30.0–36.0)
MCV: 98.8 fl (ref 78.0–100.0)
MONO ABS: 0.8 10*3/uL (ref 0.1–1.0)
Monocytes Relative: 12.3 % — ABNORMAL HIGH (ref 3.0–12.0)
NEUTROS ABS: 3 10*3/uL (ref 1.4–7.7)
Neutrophils Relative %: 44.4 % (ref 43.0–77.0)
Platelets: 194 10*3/uL (ref 150.0–400.0)
RBC: 4.12 Mil/uL — ABNORMAL LOW (ref 4.22–5.81)
RDW: 13.2 % (ref 11.5–15.5)
WBC: 6.8 10*3/uL (ref 4.0–10.5)

## 2015-07-20 LAB — BASIC METABOLIC PANEL
BUN: 21 mg/dL (ref 6–23)
CHLORIDE: 108 meq/L (ref 96–112)
CO2: 29 mEq/L (ref 19–32)
Calcium: 9.5 mg/dL (ref 8.4–10.5)
Creatinine, Ser: 1.2 mg/dL (ref 0.40–1.50)
GFR: 62.43 mL/min (ref >60.00)
Glucose, Bld: 109 mg/dL — ABNORMAL HIGH (ref 70–99)
POTASSIUM: 4.8 meq/L (ref 3.5–5.1)
Sodium: 145 mEq/L (ref 135–145)

## 2015-07-20 LAB — LIPID PANEL
CHOL/HDL RATIO: 4
CHOLESTEROL: 157 mg/dL (ref 0–200)
HDL: 40.5 mg/dL (ref >39.00)
LDL CALC: 100 mg/dL — AB (ref 0–99)
NonHDL: 116.83
Triglycerides: 86 mg/dL (ref 0.0–149.0)
VLDL: 17.2 mg/dL (ref 0.0–40.0)

## 2015-07-20 LAB — POCT URINALYSIS DIPSTICK
BILIRUBIN UA: NEGATIVE
GLUCOSE UA: NEGATIVE
KETONES UA: NEGATIVE
Leukocytes, UA: NEGATIVE
Nitrite, UA: NEGATIVE
Protein, UA: NEGATIVE
SPEC GRAV UA: 1.02
UROBILINOGEN UA: 0.2
pH, UA: 5

## 2015-07-20 LAB — HEPATIC FUNCTION PANEL
ALK PHOS: 37 U/L — AB (ref 39–117)
ALT: 17 U/L (ref 0–53)
AST: 27 U/L (ref 0–37)
Albumin: 3.9 g/dL (ref 3.5–5.2)
BILIRUBIN DIRECT: 0.1 mg/dL (ref 0.0–0.3)
BILIRUBIN TOTAL: 0.5 mg/dL (ref 0.2–1.2)
Total Protein: 6.2 g/dL (ref 6.0–8.3)

## 2015-07-20 LAB — TSH: TSH: 5.79 u[IU]/mL — AB (ref 0.35–4.50)

## 2015-07-25 DIAGNOSIS — C801 Malignant (primary) neoplasm, unspecified: Secondary | ICD-10-CM

## 2015-07-25 HISTORY — DX: Malignant (primary) neoplasm, unspecified: C80.1

## 2015-07-27 ENCOUNTER — Encounter: Payer: Self-pay | Admitting: Internal Medicine

## 2015-07-27 ENCOUNTER — Ambulatory Visit (INDEPENDENT_AMBULATORY_CARE_PROVIDER_SITE_OTHER): Payer: Medicare Other | Admitting: Internal Medicine

## 2015-07-27 VITALS — BP 140/80 | HR 62 | Temp 98.3°F | Resp 20 | Ht 69.0 in | Wt 213.0 lb

## 2015-07-27 DIAGNOSIS — R7989 Other specified abnormal findings of blood chemistry: Secondary | ICD-10-CM | POA: Insufficient documentation

## 2015-07-27 DIAGNOSIS — K4090 Unilateral inguinal hernia, without obstruction or gangrene, not specified as recurrent: Secondary | ICD-10-CM

## 2015-07-27 DIAGNOSIS — I10 Essential (primary) hypertension: Secondary | ICD-10-CM | POA: Diagnosis not present

## 2015-07-27 DIAGNOSIS — E7800 Pure hypercholesterolemia, unspecified: Secondary | ICD-10-CM

## 2015-07-27 DIAGNOSIS — I6521 Occlusion and stenosis of right carotid artery: Secondary | ICD-10-CM | POA: Diagnosis not present

## 2015-07-27 DIAGNOSIS — I6523 Occlusion and stenosis of bilateral carotid arteries: Secondary | ICD-10-CM | POA: Diagnosis not present

## 2015-07-27 DIAGNOSIS — R7302 Impaired glucose tolerance (oral): Secondary | ICD-10-CM | POA: Insufficient documentation

## 2015-07-27 DIAGNOSIS — Z0000 Encounter for general adult medical examination without abnormal findings: Secondary | ICD-10-CM | POA: Diagnosis not present

## 2015-07-27 DIAGNOSIS — R946 Abnormal results of thyroid function studies: Secondary | ICD-10-CM

## 2015-07-27 DIAGNOSIS — Z23 Encounter for immunization: Secondary | ICD-10-CM

## 2015-07-27 NOTE — Progress Notes (Signed)
Subjective:    Patient ID: Randall Mcgee, male    DOB: 1940-11-08, 75 y.o.   MRN: [id number]  HPI  Patient ID: Randall Mcgee, male   DOB: [birth year], 75 y.o.   MRN: [id number]  Subjective:    Patient ID: Randall Mcgee, male    DOB: Jul 27, 1940, 75 y.o.   MRN: [id number]  HPI 75 year-old patient who is seen today for a preventive health examination.  He is status post right carotid endarterectomy and has done quite well. He has treated hypertension and dyslipidemia  Past Medical History  Diagnosis Date  . Hyperlipidemia   . Hypertension   . History of chicken pox     Social History   Social History  . Marital Status: Married    Spouse Name: N/A  . Number of Children: N/A  . Years of Education: N/A   Occupational History  . Not on file.   Social History Main Topics  . Smoking status: Never Smoker   . Smokeless tobacco: Never Used  . Alcohol Use: No  . Drug Use: No  . Sexual Activity: Not on file   Other Topics Concern  . Not on file   Social History Narrative    Past Surgical History  Procedure Laterality Date  . Cholecystectomy    . Tonsillectomy    . Tonsillectomy    . Endarterectomy  06/20/11  . Endarterectomy  06/20/2011    Procedure: ENDARTERECTOMY CAROTID;  Surgeon: Elam Dutch, MD;  Location: [facility] OR;  Service: Vascular;  Laterality: Right;  Right Carotid endarterectomy with dacron patch angioplasty     Family History  Problem Relation Age of Onset  . Heart disease Mother     No Known Allergies  Current Outpatient Prescriptions on File Prior to Visit  Medication Sig Dispense Refill  . ALPRAZolam (XANAX) 0.5 MG tablet TAKE 1 TABLET BY MOUTH AT BEDTIME AS NEEDED FOR SLEEP 60 tablet 1  . atenolol (TENORMIN) 50 MG tablet TAKE 1 TABLET BY MOUTH EVERY DAY 90 tablet 3  . GINSENG PO Take 450 mg by mouth daily.     . indomethacin (INDOCIN) 25 MG capsule Take 1 capsule (25 mg total) by mouth 3 (three) times daily as needed. 90 capsule 1  .  lisinopril (PRINIVIL,ZESTRIL) 20 MG tablet TAKE 1 TABLET BY MOUTH DAILY 90 tablet 0  . Multiple Vitamins-Minerals (CENTRUM SILVER PO) Take 1 tablet by mouth daily.     . Omega-3 Fatty Acids (FISH OIL) 1200 MG CAPS Take 1,200 mg by mouth daily.     . simvastatin (ZOCOR) 40 MG tablet TAKE 1 TABLET BY MOUTH EVERY EVENING 90 tablet 1  . St Johns Wort 300 MG CAPS Take 1 capsule by mouth daily.     . tadalafil (CIALIS) 5 MG tablet Take 1 tablet (5 mg total) by mouth daily as needed for erectile dysfunction. 30 tablet 4   No current facility-administered medications on file prior to visit.    BP 140/80 mmHg  Pulse 62  Temp(Src) 98.3 F (36.8 C) (Oral)  Resp 20  Ht 5\' 9"  (1.753 m)  Wt 213 lb (96.616 kg)  BMI 31.44 kg/m2  SpO2 98%   Medicare wellness visit:   1. Risk factors, based on past M,S,F history- cardiovascular risk factors include hypertension and dyslipidemia as well as peripheral vascular disease 2. Physical activities: Remains active with gardening and housework although no regular exercise program. No exercise limitation  3. Depression/mood: History mild anxiety but no  history of severe depression or mood disorder  4. Hearing: No deficits  5. ADL's: Independent in all aspects of daily living  6. Fall risk: Low  7. Home safety: No problems identified  8. Height weight, and visual acuity; height and weight stable no change in visual acuity  9. Counseling: More regular exercise heart healthy diet encouraged  10. Lab orders based on risk factors: Laboratory studies reviewed we'll titrate statin therapy  11. Referral  followup vascular surgery  12. Care plan: Carotid artery Doppler study followup  13. Cognitive assessment: Alert and oriented normal affect. No cognitive dysfunction.  14.  Preventive services will include annual clinical exam with screening lab with special attention to his lipid profile.  Will maintain blood pressure control.  Will check serial carotid artery  Doppler studies.  Will consider follow-up colonoscopy.  Stool for occult blood negative.  Today   Patient was provided with a written and personalized care plan  15.  Provider list includes primary care radiology and GI.  And ophthalmology      Review of Systems  Constitutional: Negative for fever, chills, activity change, appetite change and fatigue.  HENT: Negative for congestion, dental problem, ear pain, hearing loss, mouth sores, rhinorrhea, sinus pressure, sneezing, tinnitus, trouble swallowing and voice change.   Eyes: Negative for photophobia, pain, redness and visual disturbance.  Respiratory: Negative for apnea, cough, choking, chest tightness, shortness of breath and wheezing.   Cardiovascular: Negative for chest pain, palpitations and leg swelling.  Gastrointestinal: Negative for nausea, vomiting, abdominal pain, diarrhea, constipation, blood in stool, abdominal distention, anal bleeding and rectal pain.  Genitourinary: Negative for dysuria, urgency, frequency, hematuria, flank pain, decreased urine volume, discharge, penile swelling, scrotal swelling, difficulty urinating, genital sores and testicular pain.  Musculoskeletal: Negative for arthralgias, back pain, gait problem, joint swelling, myalgias, neck pain and neck stiffness.  Skin: Negative for color change, rash and wound.  Neurological: Negative for dizziness, tremors, seizures, syncope, facial asymmetry, speech difficulty, weakness, light-headedness, numbness and headaches.  Hematological: Negative for adenopathy. Does not bruise/bleed easily.  Psychiatric/Behavioral: Negative for suicidal ideas, hallucinations, behavioral problems, confusion, sleep disturbance, self-injury, dysphoric mood, decreased concentration and agitation. The patient is not nervous/anxious.        Objective:   Physical Exam  Constitutional: He appears well-developed and well-nourished.  HENT:  Head: Normocephalic and atraumatic.  Right Ear:  External ear normal.  Left Ear: External ear normal.  Nose: Nose normal.  Mouth/Throat: Oropharynx is clear and moist.  Upper dentures  Eyes: Conjunctivae and EOM are normal. Pupils are equal, round, and reactive to light. No scleral icterus.  Neck: Normal range of motion. Neck supple. No JVD present. No thyromegaly present.  Right carotid endarterectomy scar  Cardiovascular: Regular rhythm, normal heart sounds and intact distal pulses.  Exam reveals no gallop and no friction rub.   No murmur heard. Pulmonary/Chest: Effort normal and breath sounds normal. He exhibits no tenderness.  Abdominal: Soft. Bowel sounds are normal. He exhibits no distension and no mass. There is no tenderness.  Prominent easily reducible right inguinal hernia  Genitourinary: Prostate normal and penis normal.  Musculoskeletal: Normal range of motion. He exhibits no edema and no tenderness.  Lymphadenopathy:    He has no cervical adenopathy.  Neurological: He is alert. He has normal reflexes. No cranial nerve deficit. Coordination normal.  Skin: Skin is warm and dry. No rash noted.  Psychiatric: He has a normal mood and affect. His behavior is normal.  Assessment & Plan:  Preventive health examination Dyslipidemia. Continue statin therapy Status post right CEA stable.  Will check a follow-up carotid artery Doppler study Hypertension Right inguinal hernia  Elevated TSH.  Will recheck TSH in 6 months.  Symptoms of hypothyroidism discussed Impaired glucose tolerance  Followup colonoscopy discussed it is that approximately 10 years. He wishes to defer and just check stool for occult blood annually  Recheck in 6 months   Review of Systems As above    Objective:   Physical Exam  As above    Assessment & Plan:  As above

## 2015-07-27 NOTE — Patient Instructions (Addendum)
Limit your sodium (Salt) intake    It is important that you exercise regularly, at least 20 minutes 3 to 4 times per week.  If you develop chest pain or shortness of breath seek  medical attention.  SIGNS AND SYMPTOMS  Signs and symptoms of hypothyroidism may include:  Feeling as though you have no energy (lethargy).  Inability to tolerate cold.  Weight gain that is not explained by a change in diet or exercise habits.  Dry skin.  Coarse hair.  Menstrual irregularity.  Slowing of thought processes.  Constipation.  Sadness or depression.

## 2015-07-27 NOTE — Progress Notes (Signed)
Pre visit review using our clinic review tool, if applicable. No additional management support is needed unless otherwise documented below in the visit note. 

## 2015-07-30 ENCOUNTER — Encounter (HOSPITAL_COMMUNITY): Payer: PRIVATE HEALTH INSURANCE

## 2015-08-02 ENCOUNTER — Inpatient Hospital Stay (HOSPITAL_COMMUNITY): Admission: RE | Admit: 2015-08-02 | Payer: PRIVATE HEALTH INSURANCE | Source: Ambulatory Visit

## 2015-08-09 ENCOUNTER — Ambulatory Visit (HOSPITAL_COMMUNITY)
Admission: RE | Admit: 2015-08-09 | Discharge: 2015-08-09 | Disposition: A | Discharge status: Home / Self Care | Payer: Medicare Other | Source: Ambulatory Visit | Attending: Internal Medicine | Admitting: Internal Medicine

## 2015-08-09 DIAGNOSIS — E785 Hyperlipidemia, unspecified: Secondary | ICD-10-CM | POA: Insufficient documentation

## 2015-08-09 DIAGNOSIS — I6523 Occlusion and stenosis of bilateral carotid arteries: Secondary | ICD-10-CM | POA: Diagnosis not present

## 2015-08-09 DIAGNOSIS — Z87891 Personal history of nicotine dependence: Secondary | ICD-10-CM | POA: Diagnosis not present

## 2015-08-09 DIAGNOSIS — I6521 Occlusion and stenosis of right carotid artery: Secondary | ICD-10-CM | POA: Diagnosis present

## 2015-08-09 DIAGNOSIS — I10 Essential (primary) hypertension: Secondary | ICD-10-CM | POA: Diagnosis not present

## 2015-08-11 ENCOUNTER — Encounter: Payer: PRIVATE HEALTH INSURANCE | Admitting: Internal Medicine

## 2015-09-09 ENCOUNTER — Other Ambulatory Visit: Payer: Self-pay | Admitting: Internal Medicine

## 2015-09-28 DIAGNOSIS — Z85828 Personal history of other malignant neoplasm of skin: Secondary | ICD-10-CM | POA: Diagnosis not present

## 2015-09-28 DIAGNOSIS — L812 Freckles: Secondary | ICD-10-CM | POA: Diagnosis not present

## 2015-09-28 DIAGNOSIS — L570 Actinic keratosis: Secondary | ICD-10-CM | POA: Diagnosis not present

## 2015-09-28 DIAGNOSIS — Z85820 Personal history of malignant melanoma of skin: Secondary | ICD-10-CM | POA: Diagnosis not present

## 2015-09-28 DIAGNOSIS — D1801 Hemangioma of skin and subcutaneous tissue: Secondary | ICD-10-CM | POA: Diagnosis not present

## 2015-09-28 DIAGNOSIS — D225 Melanocytic nevi of trunk: Secondary | ICD-10-CM | POA: Diagnosis not present

## 2015-09-28 DIAGNOSIS — L821 Other seborrheic keratosis: Secondary | ICD-10-CM | POA: Diagnosis not present

## 2015-11-07 ENCOUNTER — Other Ambulatory Visit: Payer: Self-pay | Admitting: Internal Medicine

## 2015-12-27 ENCOUNTER — Ambulatory Visit (INDEPENDENT_AMBULATORY_CARE_PROVIDER_SITE_OTHER): Payer: Medicare Other | Admitting: Internal Medicine

## 2015-12-27 ENCOUNTER — Encounter: Payer: Self-pay | Admitting: Internal Medicine

## 2015-12-27 VITALS — BP 146/80 | HR 57 | Temp 98.3°F | Resp 20 | Ht 69.0 in | Wt 215.0 lb

## 2015-12-27 DIAGNOSIS — I10 Essential (primary) hypertension: Secondary | ICD-10-CM | POA: Diagnosis not present

## 2015-12-27 DIAGNOSIS — R946 Abnormal results of thyroid function studies: Secondary | ICD-10-CM

## 2015-12-27 DIAGNOSIS — R7989 Other specified abnormal findings of blood chemistry: Secondary | ICD-10-CM

## 2015-12-27 DIAGNOSIS — I6521 Occlusion and stenosis of right carotid artery: Secondary | ICD-10-CM | POA: Diagnosis not present

## 2015-12-27 NOTE — Progress Notes (Signed)
Subjective:    Patient ID: Randall Mcgee, male    DOB: Sep 26, 1940, 75 y.o.   MRN: [id number]  HPI  75 year old patient who has a history of essential hypertension, dyslipidemia and thyroid dysfunction.  He presents with a 7-10 day history of cough, nasal congestion with rhinorrhea.  There is been no fever.  He has had some occasional sore throat.  He has tried Zyrtec and fluticasone nasal spray.  The latter was associated with nosebleeds and has been discontinued.  He has benefited somewhat from cough drops  Past Medical History  Diagnosis Date  . Hyperlipidemia   . Hypertension   . History of chicken pox      Social History   Social History  . Marital Status: Married    Spouse Name: N/A  . Number of Children: N/A  . Years of Education: N/A   Occupational History  . Not on file.   Social History Main Topics  . Smoking status: Never Smoker   . Smokeless tobacco: Never Used  . Alcohol Use: No  . Drug Use: No  . Sexual Activity: Not on file   Other Topics Concern  . Not on file   Social History Narrative    Past Surgical History  Procedure Laterality Date  . Cholecystectomy    . Tonsillectomy    . Tonsillectomy    . Endarterectomy  06/20/11  . Endarterectomy  06/20/2011    Procedure: ENDARTERECTOMY CAROTID;  Surgeon: Elam Dutch, MD;  Location: [facility] OR;  Service: Vascular;  Laterality: Right;  Right Carotid endarterectomy with dacron patch angioplasty     Family History  Problem Relation Age of Onset  . Heart disease Mother     No Known Allergies  Current Outpatient Prescriptions on File Prior to Visit  Medication Sig Dispense Refill  . ALPRAZolam (XANAX) 0.5 MG tablet TAKE 1 TABLET BY MOUTH AT BEDTIME AS NEEDED FOR SLEEP 60 tablet 1  . aspirin 81 MG tablet Take 81 mg by mouth daily.    Marland Kitchen atenolol (TENORMIN) 50 MG tablet TAKE 1 TABLET BY MOUTH EVERY DAY 90 tablet 3  . GINSENG PO Take 450 mg by mouth daily.     . indomethacin (INDOCIN) 25 MG capsule  Take 1 capsule (25 mg total) by mouth 3 (three) times daily as needed. 90 capsule 1  . lisinopril (PRINIVIL,ZESTRIL) 20 MG tablet TAKE 1 TABLET BY MOUTH DAILY 90 tablet 1  . Multiple Vitamins-Minerals (CENTRUM SILVER PO) Take 1 tablet by mouth daily.     . Omega-3 Fatty Acids (FISH OIL) 1200 MG CAPS Take 1,200 mg by mouth daily.     . simvastatin (ZOCOR) 40 MG tablet TAKE 1 TABLET BY MOUTH EVERY EVENING 90 tablet 0  . St Johns Wort 300 MG CAPS Take 1 capsule by mouth daily.     . tadalafil (CIALIS) 5 MG tablet Take 1 tablet (5 mg total) by mouth daily as needed for erectile dysfunction. 30 tablet 4   No current facility-administered medications on file prior to visit.    BP 146/80 mmHg  Pulse 57  Temp(Src) 98.3 F (36.8 C) (Oral)  Resp 20  Ht 5\' 9"  (1.753 m)  Wt 215 lb (97.523 kg)  BMI 31.74 kg/m2  SpO2 98%     Review of Systems  Constitutional: Positive for activity change. Negative for fever, chills, appetite change and fatigue.  HENT: Positive for congestion, nosebleeds, postnasal drip, rhinorrhea and sinus pressure. Negative for dental problem, ear pain, hearing  loss, sore throat, tinnitus, trouble swallowing and voice change.   Eyes: Negative for pain, discharge and visual disturbance.  Respiratory: Positive for cough. Negative for chest tightness, wheezing and stridor.   Cardiovascular: Negative for chest pain, palpitations and leg swelling.  Gastrointestinal: Negative for nausea, vomiting, abdominal pain, diarrhea, constipation, blood in stool and abdominal distention.  Genitourinary: Negative for urgency, hematuria, flank pain, discharge, difficulty urinating and genital sores.  Musculoskeletal: Negative for myalgias, back pain, joint swelling, arthralgias, gait problem and neck stiffness.  Skin: Negative for rash.  Neurological: Negative for dizziness, syncope, speech difficulty, weakness, numbness and headaches.  Hematological: Negative for adenopathy. Does not  bruise/bleed easily.  Psychiatric/Behavioral: Negative for behavioral problems and dysphoric mood. The patient is not nervous/anxious.        Objective:   Physical Exam  Constitutional: He is oriented to person, place, and time. He appears well-developed.  HENT:  Head: Normocephalic.  Right Ear: External ear normal.  Left Ear: External ear normal.  Low hanging soft palate  Eyes: Conjunctivae and EOM are normal.  Neck: Normal range of motion.  Cardiovascular: Normal rate and normal heart sounds.   Pulmonary/Chest: Effort normal and breath sounds normal. No respiratory distress. He has no wheezes.  Abdominal: Bowel sounds are normal.  Musculoskeletal: Normal range of motion. He exhibits no edema or tenderness.  Neurological: He is alert and oriented to person, place, and time.  Psychiatric: He has a normal mood and affect. His behavior is normal.          Assessment & Plan:   Viral URI with cough Essential hypertension, stable.  Repeat blood pressure 120/76 Dyslipidemia  We'll treat symptomatically  Nyoka Cowden, MD

## 2015-12-27 NOTE — Progress Notes (Signed)
Pre visit review using our clinic review tool, if applicable. No additional management support is needed unless otherwise documented below in the visit note. 

## 2015-12-27 NOTE — Patient Instructions (Signed)
Continue Zyrtec once daily  Acute bronchitis symptoms  are generally not helped by antibiotics.  Take over-the-counter expectorants and cough medications such as  Mucinex DM.  Call if there is no improvement in 5 to 7 days or if  you develop worsening cough, fever, or new symptoms, such as shortness of breath or chest pain.  Continue cough drops  HOME CARE INSTRUCTIONS  Drink plenty of water. Water helps thin the mucus so your sinuses can drain more easily.  Use a humidifier.  Inhale steam 3-4 times a day (for example, sit in the bathroom with the shower running).   Use saline nasal sprays to help moisten and clean your sinuses.  Take medicines only as directed by your health care provider.

## 2016-01-03 ENCOUNTER — Encounter: Payer: Self-pay | Admitting: Internal Medicine

## 2016-01-03 ENCOUNTER — Telehealth: Payer: Self-pay | Admitting: Internal Medicine

## 2016-01-03 ENCOUNTER — Ambulatory Visit (INDEPENDENT_AMBULATORY_CARE_PROVIDER_SITE_OTHER): Payer: Medicare Other | Admitting: Internal Medicine

## 2016-01-03 VITALS — BP 112/82 | HR 57 | Temp 98.9°F | Ht 69.0 in | Wt 205.0 lb

## 2016-01-03 DIAGNOSIS — I6521 Occlusion and stenosis of right carotid artery: Secondary | ICD-10-CM

## 2016-01-03 DIAGNOSIS — B9789 Other viral agents as the cause of diseases classified elsewhere: Principal | ICD-10-CM

## 2016-01-03 DIAGNOSIS — J069 Acute upper respiratory infection, unspecified: Secondary | ICD-10-CM | POA: Diagnosis not present

## 2016-01-03 NOTE — Patient Instructions (Signed)
Hold lisinopril.  Okay to resume once cough has resolved  Acute bronchitis symptoms are generally not helped by antibiotics.  Take over-the-counter expectorants and cough medications such as  Mucinex DM.  Call if there is no improvement in 5 to 7 days or if  you develop worsening cough, fever, or new symptoms, such as shortness of breath or chest pain.

## 2016-01-03 NOTE — Telephone Encounter (Signed)
Noted  

## 2016-01-03 NOTE — Progress Notes (Signed)
Subjective:    Patient ID: Randall Mcgee, male    DOB: 03-Apr-1941, 75 y.o.   MRN: [id number]  HPI  75 year old patient who was seen 1 week ago with a chief complaint of cough.  He states that he is 70% better.  Cough is largely nonproductive.  He describes a tickle in his throat and some mild sputum production.  He sleeps well at night but often must sit in a recliner.  No fever, wheezing or shortness of breath He does have treated hypertension, controlled with lisinopril  Past Medical History  Diagnosis Date  . Hyperlipidemia   . Hypertension   . History of chicken pox      Social History   Social History  . Marital Status: Married    Spouse Name: N/A  . Number of Children: N/A  . Years of Education: N/A   Occupational History  . Not on file.   Social History Main Topics  . Smoking status: Never Smoker   . Smokeless tobacco: Never Used  . Alcohol Use: No  . Drug Use: No  . Sexual Activity: Not on file   Other Topics Concern  . Not on file   Social History Narrative    Past Surgical History  Procedure Laterality Date  . Cholecystectomy    . Tonsillectomy    . Tonsillectomy    . Endarterectomy  06/20/11  . Endarterectomy  06/20/2011    Procedure: ENDARTERECTOMY CAROTID;  Surgeon: Elam Dutch, MD;  Location: [facility] OR;  Service: Vascular;  Laterality: Right;  Right Carotid endarterectomy with dacron patch angioplasty     Family History  Problem Relation Age of Onset  . Heart disease Mother     No Known Allergies  Current Outpatient Prescriptions on File Prior to Visit  Medication Sig Dispense Refill  . ALPRAZolam (XANAX) 0.5 MG tablet TAKE 1 TABLET BY MOUTH AT BEDTIME AS NEEDED FOR SLEEP 60 tablet 1  . aspirin 81 MG tablet Take 81 mg by mouth daily.    Marland Kitchen atenolol (TENORMIN) 50 MG tablet TAKE 1 TABLET BY MOUTH EVERY DAY 90 tablet 3  . GINSENG PO Take 450 mg by mouth daily.     . indomethacin (INDOCIN) 25 MG capsule Take 1 capsule (25 mg total) by mouth  3 (three) times daily as needed. 90 capsule 1  . lisinopril (PRINIVIL,ZESTRIL) 20 MG tablet TAKE 1 TABLET BY MOUTH DAILY 90 tablet 1  . Multiple Vitamins-Minerals (CENTRUM SILVER PO) Take 1 tablet by mouth daily.     . Omega-3 Fatty Acids (FISH OIL) 1200 MG CAPS Take 1,200 mg by mouth daily.     . simvastatin (ZOCOR) 40 MG tablet TAKE 1 TABLET BY MOUTH EVERY EVENING 90 tablet 0  . St Johns Wort 300 MG CAPS Take 1 capsule by mouth daily.     . tadalafil (CIALIS) 5 MG tablet Take 1 tablet (5 mg total) by mouth daily as needed for erectile dysfunction. 30 tablet 4   No current facility-administered medications on file prior to visit.    BP 112/82 mmHg  Pulse 57  Temp(Src) 98.9 F (37.2 C) (Oral)  Ht 5\' 9"  (1.753 m)  Wt 205 lb (92.987 kg)  BMI 30.26 kg/m2  SpO2 96%     Review of Systems  Constitutional: Negative for fever, chills, appetite change and fatigue.  HENT: Negative for congestion, dental problem, ear pain, hearing loss, sore throat, tinnitus, trouble swallowing and voice change.   Eyes: Negative for pain, discharge  and visual disturbance.  Respiratory: Positive for cough. Negative for chest tightness, wheezing and stridor.   Cardiovascular: Negative for chest pain, palpitations and leg swelling.  Gastrointestinal: Negative for nausea, vomiting, abdominal pain, diarrhea, constipation, blood in stool and abdominal distention.  Genitourinary: Negative for urgency, hematuria, flank pain, discharge, difficulty urinating and genital sores.  Musculoskeletal: Negative for myalgias, back pain, joint swelling, arthralgias, gait problem and neck stiffness.  Skin: Negative for rash.  Neurological: Negative for dizziness, syncope, speech difficulty, weakness, numbness and headaches.  Hematological: Negative for adenopathy. Does not bruise/bleed easily.  Psychiatric/Behavioral: Negative for behavioral problems and dysphoric mood. The patient is not nervous/anxious.        Objective:    Physical Exam  Constitutional: He is oriented to person, place, and time. He appears well-developed.  HENT:  Head: Normocephalic.  Right Ear: External ear normal.  Left Ear: External ear normal.  Eyes: Conjunctivae and EOM are normal.  Neck: Normal range of motion.  Cardiovascular: Normal rate and normal heart sounds.   Pulmonary/Chest: Breath sounds normal. No respiratory distress. He has no wheezes. He has no rales.  Abdominal: Bowel sounds are normal.  Musculoskeletal: Normal range of motion. He exhibits no edema or tenderness.  Neurological: He is alert and oriented to person, place, and time.  Psychiatric: He has a normal mood and affect. His behavior is normal.          Assessment & Plan:   Resolving viral URI with cough.  Will continue symptomatic treatment Hypertension, well-controlled.  We'll continue atenolol but place lisinopril on hold until cough has resolved  Patient will report any new or worsening symptoms

## 2016-01-03 NOTE — Telephone Encounter (Signed)
Pt coming in today to see Dr. Raliegh Ip

## 2016-01-03 NOTE — Progress Notes (Signed)
Pre visit review using our clinic review tool, if applicable. No additional management support is needed unless otherwise documented below in the visit note. 

## 2016-01-03 NOTE — Telephone Encounter (Signed)
Patient Name: Randall Mcgee  DOB: [birth year]    Initial Comment Caller states, was seen last week, was prescribed Rx, Sx have improved but are still there. Sore throat, productive coughing spasms. He has an appt for Wed, but wants to know what to do until then.    Nurse Assessment  Nurse: Leilani Merl, RN, Heather Date/Time (Eastern Time): 01/03/2016 8:25:01 AM  Confirm and document reason for call. If symptomatic, describe symptoms. You must click the next button to save text entered. ---Caller states, was seen last week, was prescribed Rx, Sx have improved but are still there. Sore throat, productive coughing spasms. He has an appt for Wed, but wants to know what to do until then.  Has the patient traveled out of the country within the last 30 days? ---Not Applicable  Does the patient have any new or worsening symptoms? ---Yes  Will a triage be completed? ---Yes  Related visit to physician within the last 2 weeks? ---No  Does the PT have any chronic conditions? (i.e. diabetes, asthma, etc.) ---Yes  List chronic conditions. ---See MR  Is this a behavioral health or substance abuse call? ---No     Guidelines    Guideline Title Affirmed Question Affirmed Notes  Cough - Acute Productive SEVERE coughing spells (e.g., whooping sound after coughing, vomiting after coughing)    Final Disposition User   See Physician within 24 Hours Standifer, RN, Water quality scientist    Comments  Appt with Dr. Raliegh Ip scheduled for today at 4 pm.   Referrals  REFERRED TO PCP OFFICE   Disagree/Comply: Comply

## 2016-01-06 ENCOUNTER — Other Ambulatory Visit: Payer: Self-pay | Admitting: Internal Medicine

## 2016-01-07 NOTE — Telephone Encounter (Signed)
Okay for refill?  

## 2016-01-07 NOTE — Telephone Encounter (Signed)
Rx called in 

## 2016-01-07 NOTE — Telephone Encounter (Signed)
Okay to refill? 

## 2016-01-28 ENCOUNTER — Ambulatory Visit (INDEPENDENT_AMBULATORY_CARE_PROVIDER_SITE_OTHER): Payer: Medicare Other | Admitting: Internal Medicine

## 2016-01-28 ENCOUNTER — Encounter: Payer: Self-pay | Admitting: Internal Medicine

## 2016-01-28 VITALS — BP 124/68 | HR 60 | Temp 98.6°F | Ht 69.0 in | Wt 204.0 lb

## 2016-01-28 DIAGNOSIS — I10 Essential (primary) hypertension: Secondary | ICD-10-CM

## 2016-01-28 DIAGNOSIS — E039 Hypothyroidism, unspecified: Secondary | ICD-10-CM

## 2016-01-28 DIAGNOSIS — R7302 Impaired glucose tolerance (oral): Secondary | ICD-10-CM

## 2016-01-28 DIAGNOSIS — I6521 Occlusion and stenosis of right carotid artery: Secondary | ICD-10-CM

## 2016-01-28 DIAGNOSIS — R7989 Other specified abnormal findings of blood chemistry: Secondary | ICD-10-CM | POA: Diagnosis not present

## 2016-01-28 LAB — T4, FREE: Free T4: 0.82 ng/dL (ref 0.60–1.60)

## 2016-01-28 LAB — TSH: TSH: 4.46 u[IU]/mL (ref 0.35–4.50)

## 2016-01-28 LAB — HEMOGLOBIN A1C: Hgb A1c MFr Bld: 6.1 % (ref 4.6–6.5)

## 2016-01-28 NOTE — Progress Notes (Signed)
Pre visit review using our clinic review tool, if applicable. No additional management support is needed unless otherwise documented below in the visit note. 

## 2016-01-28 NOTE — Progress Notes (Signed)
Subjective:    Patient ID: Randall Mcgee, male    DOB: 1941/05/17, 75 y.o.   MRN: [id number]  HPI  75 year old patient who is seen today for follow-up.  He has essential hypertension.  He was seen recently for a URI and these symptoms have all resolved.  At the time of his annual exam.  He was noted have a elevated TSH.  Fasting blood sugar was also 109.  He feels well today  Past Medical History  Diagnosis Date  . Hyperlipidemia   . Hypertension   . History of chicken pox      Social History   Social History  . Marital Status: Married    Spouse Name: N/A  . Number of Children: N/A  . Years of Education: N/A   Occupational History  . Not on file.   Social History Main Topics  . Smoking status: Never Smoker   . Smokeless tobacco: Never Used  . Alcohol Use: No  . Drug Use: No  . Sexual Activity: Not on file   Other Topics Concern  . Not on file   Social History Narrative    Past Surgical History  Procedure Laterality Date  . Cholecystectomy    . Tonsillectomy    . Tonsillectomy    . Endarterectomy  06/20/11  . Endarterectomy  06/20/2011    Procedure: ENDARTERECTOMY CAROTID;  Surgeon: Elam Dutch, MD;  Location: [facility] OR;  Service: Vascular;  Laterality: Right;  Right Carotid endarterectomy with dacron patch angioplasty     Family History  Problem Relation Age of Onset  . Heart disease Mother     No Known Allergies  Current Outpatient Prescriptions on File Prior to Visit  Medication Sig Dispense Refill  . ALPRAZolam (XANAX) 0.5 MG tablet TAKE 1 TABLET BY MOUTH EVERY NIGHT AT BEDTIME AS NEEDED FOR SLEEP 60 tablet 0  . aspirin 81 MG tablet Take 81 mg by mouth daily.    Marland Kitchen atenolol (TENORMIN) 50 MG tablet TAKE 1 TABLET BY MOUTH EVERY DAY 90 tablet 3  . GINSENG PO Take 450 mg by mouth daily.     . indomethacin (INDOCIN) 25 MG capsule Take 1 capsule (25 mg total) by mouth 3 (three) times daily as needed. 90 capsule 1  . lisinopril (PRINIVIL,ZESTRIL) 20 MG  tablet TAKE 1 TABLET BY MOUTH DAILY 90 tablet 1  . Multiple Vitamins-Minerals (CENTRUM SILVER PO) Take 1 tablet by mouth daily.     . Omega-3 Fatty Acids (FISH OIL) 1200 MG CAPS Take 1,200 mg by mouth daily.     . simvastatin (ZOCOR) 40 MG tablet TAKE 1 TABLET BY MOUTH EVERY EVENING 90 tablet 0  . St Johns Wort 300 MG CAPS Take 1 capsule by mouth daily.     . tadalafil (CIALIS) 5 MG tablet Take 1 tablet (5 mg total) by mouth daily as needed for erectile dysfunction. 30 tablet 4   No current facility-administered medications on file prior to visit.    BP 124/68 mmHg  Pulse 60  Temp(Src) 98.6 F (37 C) (Oral)  Ht 5\' 9"  (1.753 m)  Wt 204 lb (92.534 kg)  BMI 30.11 kg/m2  SpO2 97%     Review of Systems  Constitutional: Negative for fever, chills, appetite change and fatigue.  HENT: Negative for congestion, dental problem, ear pain, hearing loss, sore throat, tinnitus, trouble swallowing and voice change.   Eyes: Negative for pain, discharge and visual disturbance.  Respiratory: Negative for cough, chest tightness, wheezing and  stridor.   Cardiovascular: Negative for chest pain, palpitations and leg swelling.  Gastrointestinal: Negative for nausea, vomiting, abdominal pain, diarrhea, constipation, blood in stool and abdominal distention.  Genitourinary: Negative for urgency, hematuria, flank pain, discharge, difficulty urinating and genital sores.  Musculoskeletal: Negative for myalgias, back pain, joint swelling, arthralgias, gait problem and neck stiffness.  Skin: Negative for rash.  Neurological: Negative for dizziness, syncope, speech difficulty, weakness, numbness and headaches.  Hematological: Negative for adenopathy. Does not bruise/bleed easily.  Psychiatric/Behavioral: Negative for behavioral problems and dysphoric mood. The patient is not nervous/anxious.        Objective:   Physical Exam  Constitutional: He is oriented to person, place, and time. He appears  well-developed.  HENT:  Head: Normocephalic.  Right Ear: External ear normal.  Left Ear: External ear normal.  Eyes: Conjunctivae and EOM are normal.  Neck: Normal range of motion.  Cardiovascular: Normal rate and normal heart sounds.   Pulmonary/Chest: Breath sounds normal.  Musculoskeletal: Normal range of motion. He exhibits no edema or tenderness.  Neurological: He is alert and oriented to person, place, and time.  Psychiatric: He has a normal mood and affect. His behavior is normal.          Assessment & Plan:   Hypertension, well-controlled History of abnormal TSH.  We'll check TSH with free T4 Impaired glucose tolerance.  We'll check hemoglobin A1c  Follow-up 6 months or as needed  Nyoka Cowden, MD

## 2016-01-28 NOTE — Patient Instructions (Signed)
Limit your sodium (Salt) intake    It is important that you exercise regularly, at least 20 minutes 3 to 4 times per week.  If you develop chest pain or shortness of breath seek  medical attention.  Return in 6 months for follow-up  Please check your blood pressure on a regular basis.  If it is consistently greater than 150/90, please make an office appointment.   

## 2016-02-08 ENCOUNTER — Other Ambulatory Visit: Payer: Self-pay | Admitting: Internal Medicine

## 2016-02-08 NOTE — Telephone Encounter (Signed)
Rx refill sent to pharmacy. 

## 2016-02-28 ENCOUNTER — Other Ambulatory Visit: Payer: Self-pay | Admitting: Internal Medicine

## 2016-02-28 NOTE — Telephone Encounter (Signed)
Okay to refill? 

## 2016-02-29 NOTE — Telephone Encounter (Signed)
Okay to refill? 

## 2016-03-04 ENCOUNTER — Other Ambulatory Visit: Payer: Self-pay | Admitting: Internal Medicine

## 2016-03-30 ENCOUNTER — Other Ambulatory Visit: Payer: Self-pay | Admitting: Plastic Surgery

## 2016-03-30 DIAGNOSIS — L578 Other skin changes due to chronic exposure to nonionizing radiation: Secondary | ICD-10-CM | POA: Diagnosis not present

## 2016-03-30 DIAGNOSIS — L814 Other melanin hyperpigmentation: Secondary | ICD-10-CM | POA: Diagnosis not present

## 2016-03-30 DIAGNOSIS — L570 Actinic keratosis: Secondary | ICD-10-CM | POA: Diagnosis not present

## 2016-03-30 DIAGNOSIS — L821 Other seborrheic keratosis: Secondary | ICD-10-CM | POA: Diagnosis not present

## 2016-03-30 DIAGNOSIS — D225 Melanocytic nevi of trunk: Secondary | ICD-10-CM | POA: Diagnosis not present

## 2016-03-30 DIAGNOSIS — Z85820 Personal history of malignant melanoma of skin: Secondary | ICD-10-CM | POA: Diagnosis not present

## 2016-03-30 DIAGNOSIS — C434 Malignant melanoma of scalp and neck: Secondary | ICD-10-CM | POA: Diagnosis not present

## 2016-03-30 DIAGNOSIS — Z85828 Personal history of other malignant neoplasm of skin: Secondary | ICD-10-CM | POA: Diagnosis not present

## 2016-04-03 ENCOUNTER — Other Ambulatory Visit: Payer: Self-pay | Admitting: Internal Medicine

## 2016-04-03 NOTE — Telephone Encounter (Signed)
Pharm not longer manufacture atenolol 50 mg please advise

## 2016-04-07 ENCOUNTER — Telehealth: Payer: Self-pay | Admitting: Internal Medicine

## 2016-04-07 NOTE — Telephone Encounter (Signed)
Left message on voicemail to call office.  

## 2016-04-07 NOTE — Telephone Encounter (Signed)
Randall Mcgee pt would like to speak with you after 2:30 concerning his Rx atenolol.

## 2016-04-10 MED ORDER — NADOLOL 40 MG PO TABS: 40.0000 mg | ORAL_TABLET | Freq: Every day | ORAL | 1 refills | Status: DC

## 2016-04-10 NOTE — Telephone Encounter (Signed)
Dr. Raliegh Ip, pharmacy no longer has Atenolol available. Please advise what medication you would like pt to go on.

## 2016-04-10 NOTE — Telephone Encounter (Signed)
Pt presented to office said that Atenolol was no longer available. Michele Rockers to tell him I will let Dr.K know and get back to him later today with another medication.

## 2016-04-10 NOTE — Telephone Encounter (Signed)
Nadolol 40 mg  #90 one daily

## 2016-04-10 NOTE — Telephone Encounter (Signed)
Spoke to pt, told him Dr. Raliegh Ip is going to change him to Nadolol 40 mg one tablet daily to replace Atenolol. Rx sent to pharmacy. Pt verbalized understanding.

## 2016-04-11 ENCOUNTER — Other Ambulatory Visit: Payer: Self-pay | Admitting: Internal Medicine

## 2016-04-11 ENCOUNTER — Telehealth: Payer: Self-pay | Admitting: Internal Medicine

## 2016-04-11 MED ORDER — PROPRANOLOL HCL 20 MG PO TABS: 20.0000 mg | ORAL_TABLET | Freq: Two times a day (BID) | ORAL | 5 refills | Status: DC

## 2016-04-11 NOTE — Telephone Encounter (Signed)
Spoke to pt, told him can take Propranolol 20 mg twice a day or Propranolol ER 40 mg daily per Dr.K. Pt said he will take Propranolol 20 mg twice a day. Told pt okay will send Rx to pharmacy. Pt verbalized understanding.

## 2016-04-11 NOTE — Telephone Encounter (Signed)
Pt can not afford nadolol. Pt would like to try propranolol 20 mg. [facility] on [address]

## 2016-04-11 NOTE — Telephone Encounter (Signed)
Will need to take propranolol 20 mg twice a day Or propranolol ER 40 milligrams once daily

## 2016-04-11 NOTE — Telephone Encounter (Signed)
Please see message and advise 

## 2016-04-18 DIAGNOSIS — C434 Malignant melanoma of scalp and neck: Secondary | ICD-10-CM | POA: Diagnosis not present

## 2016-04-24 ENCOUNTER — Other Ambulatory Visit: Payer: Self-pay | Admitting: Internal Medicine

## 2016-04-24 DIAGNOSIS — C434 Malignant melanoma of scalp and neck: Secondary | ICD-10-CM | POA: Diagnosis not present

## 2016-05-10 ENCOUNTER — Other Ambulatory Visit: Payer: Self-pay | Admitting: General Surgery

## 2016-05-10 DIAGNOSIS — C439 Malignant melanoma of skin, unspecified: Secondary | ICD-10-CM

## 2016-05-10 DIAGNOSIS — C434 Malignant melanoma of scalp and neck: Secondary | ICD-10-CM | POA: Diagnosis not present

## 2016-05-22 ENCOUNTER — Ambulatory Visit (HOSPITAL_COMMUNITY)
Admission: RE | Admit: 2016-05-22 | Discharge: 2016-05-22 | Disposition: A | Discharge status: Home / Self Care | Payer: Medicare Other | Source: Ambulatory Visit | Attending: General Surgery | Admitting: General Surgery

## 2016-05-22 DIAGNOSIS — C439 Malignant melanoma of skin, unspecified: Secondary | ICD-10-CM | POA: Diagnosis not present

## 2016-05-22 MED ORDER — TECHNETIUM TC 99M SULFUR COLLOID FILTERED
0.5000 | Freq: Once | INTRAVENOUS | Status: AC | PRN
  Administered 2016-05-22: 0.5 via INTRADERMAL

## 2016-05-23 ENCOUNTER — Ambulatory Visit (INDEPENDENT_AMBULATORY_CARE_PROVIDER_SITE_OTHER): Payer: Medicare Other | Admitting: *Deleted

## 2016-05-23 DIAGNOSIS — Z23 Encounter for immunization: Secondary | ICD-10-CM

## 2016-05-29 DIAGNOSIS — Z85820 Personal history of malignant melanoma of skin: Secondary | ICD-10-CM | POA: Diagnosis not present

## 2016-05-29 DIAGNOSIS — C434 Malignant melanoma of scalp and neck: Secondary | ICD-10-CM | POA: Diagnosis not present

## 2016-06-22 ENCOUNTER — Encounter (HOSPITAL_BASED_OUTPATIENT_CLINIC_OR_DEPARTMENT_OTHER): Payer: Self-pay | Admitting: *Deleted

## 2016-06-23 ENCOUNTER — Ambulatory Visit: Payer: Self-pay | Admitting: Plastic Surgery

## 2016-06-23 DIAGNOSIS — C439 Malignant melanoma of skin, unspecified: Secondary | ICD-10-CM

## 2016-06-26 ENCOUNTER — Other Ambulatory Visit: Payer: Self-pay

## 2016-06-26 ENCOUNTER — Encounter (HOSPITAL_BASED_OUTPATIENT_CLINIC_OR_DEPARTMENT_OTHER)
Admission: RE | Admit: 2016-06-26 | Discharge: 2016-06-26 | Disposition: A | Discharge status: Home / Self Care | Payer: Medicare Other | Source: Ambulatory Visit | Attending: Plastic Surgery | Admitting: Plastic Surgery

## 2016-06-26 DIAGNOSIS — N529 Male erectile dysfunction, unspecified: Secondary | ICD-10-CM | POA: Diagnosis not present

## 2016-06-26 DIAGNOSIS — Z7982 Long term (current) use of aspirin: Secondary | ICD-10-CM | POA: Diagnosis not present

## 2016-06-26 DIAGNOSIS — I10 Essential (primary) hypertension: Secondary | ICD-10-CM | POA: Diagnosis not present

## 2016-06-26 DIAGNOSIS — I739 Peripheral vascular disease, unspecified: Secondary | ICD-10-CM | POA: Diagnosis not present

## 2016-06-26 DIAGNOSIS — I6529 Occlusion and stenosis of unspecified carotid artery: Secondary | ICD-10-CM | POA: Diagnosis not present

## 2016-06-26 DIAGNOSIS — E785 Hyperlipidemia, unspecified: Secondary | ICD-10-CM | POA: Diagnosis not present

## 2016-06-26 DIAGNOSIS — C434 Malignant melanoma of scalp and neck: Secondary | ICD-10-CM | POA: Diagnosis not present

## 2016-06-28 ENCOUNTER — Encounter (HOSPITAL_BASED_OUTPATIENT_CLINIC_OR_DEPARTMENT_OTHER): Payer: Self-pay | Admitting: *Deleted

## 2016-06-28 ENCOUNTER — Encounter (HOSPITAL_BASED_OUTPATIENT_CLINIC_OR_DEPARTMENT_OTHER)
Admission: RE | Disposition: A | Discharge status: Home / Self Care | Payer: Self-pay | Source: Ambulatory Visit | Attending: Plastic Surgery

## 2016-06-28 ENCOUNTER — Ambulatory Visit (HOSPITAL_BASED_OUTPATIENT_CLINIC_OR_DEPARTMENT_OTHER): Payer: Medicare Other | Admitting: Anesthesiology

## 2016-06-28 ENCOUNTER — Ambulatory Visit (HOSPITAL_BASED_OUTPATIENT_CLINIC_OR_DEPARTMENT_OTHER)
Admission: RE | Admit: 2016-06-28 | Discharge: 2016-06-28 | Disposition: A | Discharge status: Home / Self Care | Payer: Medicare Other | Source: Ambulatory Visit | Attending: Plastic Surgery | Admitting: Plastic Surgery

## 2016-06-28 DIAGNOSIS — I739 Peripheral vascular disease, unspecified: Secondary | ICD-10-CM | POA: Insufficient documentation

## 2016-06-28 DIAGNOSIS — I6529 Occlusion and stenosis of unspecified carotid artery: Secondary | ICD-10-CM | POA: Diagnosis not present

## 2016-06-28 DIAGNOSIS — E785 Hyperlipidemia, unspecified: Secondary | ICD-10-CM | POA: Diagnosis not present

## 2016-06-28 DIAGNOSIS — C434 Malignant melanoma of scalp and neck: Secondary | ICD-10-CM | POA: Diagnosis not present

## 2016-06-28 DIAGNOSIS — I10 Essential (primary) hypertension: Secondary | ICD-10-CM | POA: Diagnosis not present

## 2016-06-28 DIAGNOSIS — N529 Male erectile dysfunction, unspecified: Secondary | ICD-10-CM | POA: Insufficient documentation

## 2016-06-28 DIAGNOSIS — Z7982 Long term (current) use of aspirin: Secondary | ICD-10-CM | POA: Diagnosis not present

## 2016-06-28 DIAGNOSIS — C439 Malignant melanoma of skin, unspecified: Secondary | ICD-10-CM

## 2016-06-28 HISTORY — PX: APPLICATION OF A-CELL OF HEAD/NECK: SHX6304

## 2016-06-28 HISTORY — PX: MASS EXCISION: SHX2000

## 2016-06-28 SURGERY — EXCISION MASS
Anesthesia: General | Site: Head

## 2016-06-28 MED ORDER — FENTANYL CITRATE (PF) 100 MCG/2ML IJ SOLN
INTRAMUSCULAR | Status: AC
  Filled 2016-06-28: qty 2

## 2016-06-28 MED ORDER — PROPOFOL 10 MG/ML IV BOLUS
INTRAVENOUS | Status: DC | PRN
  Administered 2016-06-28: 150 mg via INTRAVENOUS

## 2016-06-28 MED ORDER — CEFAZOLIN SODIUM-DEXTROSE 2-4 GM/100ML-% IV SOLN
INTRAVENOUS | Status: AC
  Filled 2016-06-28: qty 100

## 2016-06-28 MED ORDER — MIDAZOLAM HCL 2 MG/2ML IJ SOLN
INTRAMUSCULAR | Status: AC
  Filled 2016-06-28: qty 2

## 2016-06-28 MED ORDER — HYDROCODONE-ACETAMINOPHEN 5-325 MG PO TABS: 1.0000 | ORAL_TABLET | Freq: Four times a day (QID) | ORAL | 0 refills | Status: DC | PRN

## 2016-06-28 MED ORDER — HYDROCODONE-ACETAMINOPHEN 5-325 MG PO TABS
ORAL_TABLET | ORAL | Status: AC
  Filled 2016-06-28: qty 1

## 2016-06-28 MED ORDER — MIDAZOLAM HCL 2 MG/2ML IJ SOLN
1.0000 mg | INTRAMUSCULAR | Status: DC | PRN
  Administered 2016-06-28: 1 mg via INTRAVENOUS

## 2016-06-28 MED ORDER — SCOPOLAMINE 1 MG/3DAYS TD PT72: 1.0000 | MEDICATED_PATCH | Freq: Once | TRANSDERMAL | Status: DC | PRN

## 2016-06-28 MED ORDER — ACETAMINOPHEN 160 MG/5ML PO SOLN: 325.0000 mg | ORAL | Status: DC | PRN

## 2016-06-28 MED ORDER — FENTANYL CITRATE (PF) 100 MCG/2ML IJ SOLN
50.0000 ug | INTRAMUSCULAR | Status: DC | PRN
  Administered 2016-06-28: 50 ug via INTRAVENOUS

## 2016-06-28 MED ORDER — LACTATED RINGERS IV SOLN
INTRAVENOUS | Status: DC
  Administered 2016-06-28: 10:00:00 via INTRAVENOUS

## 2016-06-28 MED ORDER — ONDANSETRON HCL 4 MG/2ML IJ SOLN
INTRAMUSCULAR | Status: DC | PRN
  Administered 2016-06-28: 4 mg via INTRAVENOUS

## 2016-06-28 MED ORDER — ACETAMINOPHEN 325 MG PO TABS: 325.0000 mg | ORAL_TABLET | ORAL | Status: DC | PRN

## 2016-06-28 MED ORDER — EPHEDRINE SULFATE 50 MG/ML IJ SOLN
INTRAMUSCULAR | Status: DC | PRN
  Administered 2016-06-28: 15 mg via INTRAVENOUS

## 2016-06-28 MED ORDER — BUPIVACAINE-EPINEPHRINE 0.25% -1:200000 IJ SOLN
INTRAMUSCULAR | Status: DC | PRN
  Administered 2016-06-28: 5 mL

## 2016-06-28 MED ORDER — FENTANYL CITRATE (PF) 100 MCG/2ML IJ SOLN
25.0000 ug | INTRAMUSCULAR | Status: DC | PRN
  Administered 2016-06-28: 50 ug via INTRAVENOUS
  Administered 2016-06-28: 25 ug via INTRAVENOUS

## 2016-06-28 MED ORDER — CEFAZOLIN SODIUM-DEXTROSE 2-4 GM/100ML-% IV SOLN
2.0000 g | INTRAVENOUS | Status: AC
  Administered 2016-06-28: 2 g via INTRAVENOUS

## 2016-06-28 MED ORDER — LIDOCAINE HCL (CARDIAC) 20 MG/ML IV SOLN
INTRAVENOUS | Status: DC | PRN
  Administered 2016-06-28: 20 mg via INTRAVENOUS

## 2016-06-28 MED ORDER — HYDROCODONE-ACETAMINOPHEN 5-325 MG PO TABS
1.0000 | ORAL_TABLET | Freq: Once | ORAL | Status: AC | PRN
  Administered 2016-06-28: 1 via ORAL

## 2016-06-28 MED ORDER — DEXAMETHASONE SODIUM PHOSPHATE 4 MG/ML IJ SOLN
INTRAMUSCULAR | Status: DC | PRN
  Administered 2016-06-28: 8 mg via INTRAVENOUS

## 2016-06-28 SURGICAL SUPPLY — 85 items
BAG DECANTER FOR FLEXI CONT (MISCELLANEOUS) IMPLANT
BENZOIN TINCTURE PRP APPL 2/3 (GAUZE/BANDAGES/DRESSINGS) IMPLANT
BLADE CLIPPER SURG (BLADE) IMPLANT
BLADE HEX COATED 2.75 (ELECTRODE) IMPLANT
BLADE SURG 10 STRL SS (BLADE) IMPLANT
BLADE SURG 15 STRL LF DISP TIS (BLADE) ×1 IMPLANT
BLADE SURG 15 STRL SS (BLADE) ×2
BNDG CONFORM 2 STRL LF (GAUZE/BANDAGES/DRESSINGS) IMPLANT
BNDG ELASTIC 2X5.8 VLCR STR LF (GAUZE/BANDAGES/DRESSINGS) IMPLANT
BRIEF STRETCH FOR OB PAD LRG (UNDERPADS AND DIAPERS) IMPLANT
CANISTER SUCT 1200ML W/VALVE (MISCELLANEOUS) ×3 IMPLANT
CHLORAPREP W/TINT 26ML (MISCELLANEOUS) IMPLANT
CLEANER CAUTERY TIP 5X5 PAD (MISCELLANEOUS) IMPLANT
CLOSURE WOUND 1/2 X4 (GAUZE/BANDAGES/DRESSINGS)
CORDS BIPOLAR (ELECTRODE) IMPLANT
COVER BACK TABLE 60X90IN (DRAPES) ×3 IMPLANT
COVER MAYO STAND STRL (DRAPES) ×3 IMPLANT
DECANTER SPIKE VIAL GLASS SM (MISCELLANEOUS) IMPLANT
DERMABOND ADVANCED (GAUZE/BANDAGES/DRESSINGS)
DERMABOND ADVANCED .7 DNX12 (GAUZE/BANDAGES/DRESSINGS) IMPLANT
DRAPE INCISE IOBAN 66X45 STRL (DRAPES) IMPLANT
DRAPE LAPAROTOMY 100X72 PEDS (DRAPES) IMPLANT
DRAPE U-SHAPE 76X120 STRL (DRAPES) ×3 IMPLANT
DRESSING DUODERM 4X4 STERILE (GAUZE/BANDAGES/DRESSINGS) IMPLANT
DRSG ADAPTIC 3X8 NADH LF (GAUZE/BANDAGES/DRESSINGS) IMPLANT
DRSG EMULSION OIL 3X3 NADH (GAUZE/BANDAGES/DRESSINGS) ×3 IMPLANT
DRSG PAD ABDOMINAL 8X10 ST (GAUZE/BANDAGES/DRESSINGS) ×3 IMPLANT
DRSG TEGADERM 2-3/8X2-3/4 SM (GAUZE/BANDAGES/DRESSINGS) IMPLANT
DRSG TEGADERM 4X4.75 (GAUZE/BANDAGES/DRESSINGS) IMPLANT
ELECT COATED BLADE 2.86 ST (ELECTRODE) IMPLANT
ELECT NEEDLE BLADE 2-5/6 (NEEDLE) ×3 IMPLANT
ELECT REM PT RETURN 9FT ADLT (ELECTROSURGICAL) ×3
ELECT REM PT RETURN 9FT PED (ELECTROSURGICAL)
ELECTRODE REM PT RETRN 9FT PED (ELECTROSURGICAL) IMPLANT
ELECTRODE REM PT RTRN 9FT ADLT (ELECTROSURGICAL) ×1 IMPLANT
GAUZE SPONGE 4X4 12PLY STRL (GAUZE/BANDAGES/DRESSINGS) ×3 IMPLANT
GLOVE BIO SURGEON STRL SZ 6.5 (GLOVE) ×8 IMPLANT
GLOVE BIO SURGEONS STRL SZ 6.5 (GLOVE) ×4
GLOVE BIOGEL M 7.0 STRL (GLOVE) ×3 IMPLANT
GOWN STRL REUS W/ TWL LRG LVL3 (GOWN DISPOSABLE) ×3 IMPLANT
GOWN STRL REUS W/TWL LRG LVL3 (GOWN DISPOSABLE) ×6
MATRIX SURGICAL PSM 5X5CM (Tissue) ×3 IMPLANT
MICROMATRIX 500MG (Tissue) ×3 IMPLANT
NEEDLE HYPO 30GX1 BEV (NEEDLE) IMPLANT
NEEDLE PRECISIONGLIDE 27X1.5 (NEEDLE) ×3 IMPLANT
NS IRRIG 1000ML POUR BTL (IV SOLUTION) ×3 IMPLANT
PACK BASIN DAY SURGERY FS (CUSTOM PROCEDURE TRAY) ×3 IMPLANT
PAD CLEANER CAUTERY TIP 5X5 (MISCELLANEOUS)
PENCIL BUTTON HOLSTER BLD 10FT (ELECTRODE) ×3 IMPLANT
RUBBERBAND STERILE (MISCELLANEOUS) IMPLANT
SHEET MEDIUM DRAPE 40X70 STRL (DRAPES) IMPLANT
SLEEVE SCD COMPRESS KNEE MED (MISCELLANEOUS) ×3 IMPLANT
SOLUTION PARTIC MCRMTRX 500MG (Tissue) ×1 IMPLANT
SPONGE GAUZE 2X2 8PLY STER LF (GAUZE/BANDAGES/DRESSINGS)
SPONGE GAUZE 2X2 8PLY STRL LF (GAUZE/BANDAGES/DRESSINGS) IMPLANT
SPONGE GAUZE 4X4 12PLY STER LF (GAUZE/BANDAGES/DRESSINGS) IMPLANT
SPONGE LAP 18X18 X RAY DECT (DISPOSABLE) IMPLANT
STAPLER VISISTAT 35W (STAPLE) IMPLANT
STRIP CLOSURE SKIN 1/2X4 (GAUZE/BANDAGES/DRESSINGS) IMPLANT
SUCTION FRAZIER HANDLE 10FR (MISCELLANEOUS)
SUCTION TUBE FRAZIER 10FR DISP (MISCELLANEOUS) IMPLANT
SURGILUBE 2OZ TUBE FLIPTOP (MISCELLANEOUS) IMPLANT
SUT MNCRL 6-0 UNDY P1 1X18 (SUTURE) IMPLANT
SUT MNCRL AB 3-0 PS2 18 (SUTURE) ×3 IMPLANT
SUT MNCRL AB 4-0 PS2 18 (SUTURE) ×3 IMPLANT
SUT MON AB 5-0 P3 18 (SUTURE) IMPLANT
SUT MON AB 5-0 PS2 18 (SUTURE) IMPLANT
SUT MONOCRYL 6-0 P1 1X18 (SUTURE)
SUT PROLENE 5 0 P 3 (SUTURE) IMPLANT
SUT PROLENE 5 0 PS 2 (SUTURE) IMPLANT
SUT PROLENE 6 0 P 1 18 (SUTURE) IMPLANT
SUT VIC AB 3-0 FS2 27 (SUTURE) IMPLANT
SUT VIC AB 5-0 P-3 18X BRD (SUTURE) ×2 IMPLANT
SUT VIC AB 5-0 P3 18 (SUTURE) ×4
SUT VIC AB 5-0 PS2 18 (SUTURE) IMPLANT
SUT VICRYL 4-0 PS2 18IN ABS (SUTURE) IMPLANT
SYR BULB 3OZ (MISCELLANEOUS) IMPLANT
SYR BULB IRRIGATION 50ML (SYRINGE) IMPLANT
SYR CONTROL 10ML LL (SYRINGE) ×3 IMPLANT
TOWEL OR 17X24 6PK STRL BLUE (TOWEL DISPOSABLE) ×3 IMPLANT
TRAY DSU PREP LF (CUSTOM PROCEDURE TRAY) ×3 IMPLANT
TUBE CONNECTING 20'X1/4 (TUBING) ×1
TUBE CONNECTING 20X1/4 (TUBING) ×2 IMPLANT
UNDERPAD 30X30 (UNDERPADS AND DIAPERS) IMPLANT
YANKAUER SUCT BULB TIP NO VENT (SUCTIONS) IMPLANT

## 2016-06-28 NOTE — Anesthesia Postprocedure Evaluation (Signed)
Anesthesia Post Note  Patient: Randall Mcgee  Procedure(s) Performed: Procedure(s) (LRB): EXCISION SCALP MELANOMA (N/A) APPLICATION OF A-CELL OF HEAD/NECK (N/A)  Patient location during evaluation: PACU Anesthesia Type: General Level of consciousness: awake and alert Pain management: pain level controlled Vital Signs Assessment: post-procedure vital signs reviewed and stable Respiratory status: spontaneous breathing, nonlabored ventilation, respiratory function stable and patient connected to nasal cannula oxygen Cardiovascular status: blood pressure returned to baseline and stable Postop Assessment: no signs of nausea or vomiting Anesthetic complications: no    Last Vitals:  Vitals:   06/28/16 1245 06/28/16 1315  BP: 135/84 (!) 155/80  Pulse: 67 72  Resp: 12 18  Temp:  36.5 C    Last Pain:  Vitals:   06/28/16 1315  TempSrc:   PainSc: [name] [name]

## 2016-06-28 NOTE — Discharge Instructions (Signed)
KY gel to the scalp daily Don't get wet   Post Anesthesia Home Care Instructions  Activity: Get plenty of rest for the remainder of the day. A responsible adult should stay with you for 24 hours following the procedure.  For the next 24 hours, DO NOT: -Drive a car -Paediatric nurse -Drink alcoholic beverages -Take any medication unless instructed by your physician -Make any legal decisions or sign important papers.  Meals: Start with liquid foods such as gelatin or soup. Progress to regular foods as tolerated. Avoid greasy, spicy, heavy foods. If nausea and/or vomiting occur, drink only clear liquids until the nausea and/or vomiting subsides. Call your physician if vomiting continues.  Special Instructions/Symptoms: Your throat may feel dry or sore from the anesthesia or the breathing tube placed in your throat during surgery. If this causes discomfort, gargle with warm salt water. The discomfort should disappear within 24 hours.  If you had a scopolamine patch placed behind your ear for the management of post- operative nausea and/or vomiting:  1. The medication in the patch is effective for 72 hours, after which it should be removed.  Wrap patch in a tissue and discard in the trash. Wash hands thoroughly with soap and water. 2. You may remove the patch earlier than 72 hours if you experience unpleasant side effects which may include dry mouth, dizziness or visual disturbances. 3. Avoid touching the patch. Wash your hands with soap and water after contact with the patch.

## 2016-06-28 NOTE — Anesthesia Procedure Notes (Signed)
Procedure Name: LMA Insertion Date/Time: 06/28/2016 10:56 AM Performed by: Melynda Ripple D Pre-anesthesia Checklist: Patient identified, Emergency Drugs available, Suction available and Patient being monitored Patient Re-evaluated:Patient Re-evaluated prior to inductionOxygen Delivery Method: Circle system utilized Preoxygenation: Pre-oxygenation with 100% oxygen Intubation Type: IV induction Ventilation: Mask ventilation without difficulty LMA: LMA inserted LMA Size: 4.0 Number of attempts: 1 Airway Equipment and Method: Bite block Placement Confirmation: positive ETCO2 Tube secured with: Tape Dental Injury: Teeth and Oropharynx as per pre-operative assessment

## 2016-06-28 NOTE — Transfer of Care (Signed)
Immediate Anesthesia Transfer of Care Note  Patient: Randall Mcgee  Procedure(s) Performed: Procedure(s): EXCISION SCALP MELANOMA (N/A) APPLICATION OF A-CELL OF HEAD/NECK (N/A)  Patient Location: PACU  Anesthesia Type:General  Level of Consciousness: sedated  Airway & Oxygen Therapy: Patient Spontanous Breathing and Patient connected to face mask oxygen  Post-op Assessment: Report given to RN and Post -op Vital signs reviewed and stable  Post vital signs: Reviewed and stable  Last Vitals:  Vitals:   06/28/16 1014 06/28/16 1145  BP: (!) 146/66 140/74  Pulse: (!) 55 64  Resp: 16 13  Temp: 36.8 C (P) 36.4 C    Last Pain:  Vitals:   06/28/16 1014  TempSrc: Oral      Patients Stated Pain Goal: 0 [id number]  Complications: No apparent anesthesia complications

## 2016-06-28 NOTE — Brief Op Note (Signed)
06/28/2016  11:33 AM  PATIENT:  Randall Mcgee  75 y.o. male  PRE-OPERATIVE DIAGNOSIS:  SCALP MELANOMA  POST-OPERATIVE DIAGNOSIS:  SCALP MELANOMA  PROCEDURE:  Procedure(s): EXCISION SCALP MELANOMA (N/A) APPLICATION OF A-CELL OF HEAD/NECK (N/A)  SURGEON:  Surgeon(s) and Role:    * Loel Lofty Dillingham, DO - Primary  PHYSICIAN ASSISTANT: Shawn Rayburn, PA  ASSISTANTS: none   ANESTHESIA:   general  EBL:  No intake/output data recorded.  BLOOD ADMINISTERED:none  DRAINS: none   LOCAL MEDICATIONS USED:  MARCAINE     SPECIMEN:  Source of Specimen:  scalp  DISPOSITION OF SPECIMEN:  PATHOLOGY  COUNTS:  YES  TOURNIQUET:  * No tourniquets in log *  DICTATION: .Dragon Dictation  PLAN OF CARE: Discharge to home after PACU  PATIENT DISPOSITION:  PACU - hemodynamically stable.   Delay start of Pharmacological VTE agent (>24hrs) due to surgical blood loss or risk of bleeding: no

## 2016-06-28 NOTE — Op Note (Signed)
DATE OF OPERATION: 06/28/2016  LOCATION: [facility]  PREOPERATIVE DIAGNOSIS: scalp melanoma   POSTOPERATIVE DIAGNOSIS: Same  PROCEDURE: excision of scalp melanoma 5 x 5 cm with placement of Acell (sheet 5 x 5 cm and 500 mg powder)  SURGEON: Michall Noffke Sanger Laiana Fratus, DO  ASSISTANT: Shawn Rayburn, PA  EBL: minimal  CONDITION: Stable  COMPLICATIONS: None  INDICATION: The patient, Randall Mcgee, is a 75 y.o. male born on 1940-12-02, is here for treatment of a scalp melanoma with a depth of 1.4 cm.   PROCEDURE DETAILS:  The patient was seen prior to surgery and marked.  The IV antibiotics were given. The patient was taken to the operating room and given a general anesthetic. A standard time out was performed and all information was confirmed by those in the room. SCDs were placed.   The scalp was prepared with betadine.  The area was injected with Marcaine with epinepherine.  After waiting several minutes for the local to work the bovie was used to excise the 5 x 5 cm lesion with a 1 cm margin around the visible lesion.  The silk was used to place a long stitch 12 o'clock and short stitch at 3 o'clock.  Hemostasis was achieved with electrocautery.  All of the Acell powder and sheet was applied and secured with 5-0 Vicryl.  The adaptic was applied and secured with 5-0 Vicryl.  The 3-0 Monocryl was used to minimize the size.  The KY gel and gauze was applied. The patient was allowed to wake up and taken to recovery room in stable condition at the end of the case. The family was notified at the end of the case.

## 2016-06-28 NOTE — H&P (Signed)
Randall Mcgee is an 75 y.o. male.   Chief Complaint: Melanoma of the scalp HPI: The patient is a 75 y.o. yrs old wm here for treatment of his scalp.  He was diagnosed with scalp melanoma 5 years ago and treated with Mohs excision.  He noted a new lesion at the scar recently.  A biopsy showed a malignant melanoma 1.14 mm in thickness the deep margin was negative with the peripheral marin positive.  He underwent a workup by Dr. Grandville Silos in General surgery for lymph nodes which was negative.  The area is ~ 4 cm at the scalp in the site of the previous scar excision.  He does not have any palpable neck nodes.  He is doing well and not had any recent illnesses.    Past Medical History:  Diagnosis Date  . History of chicken pox   . Hyperlipidemia   . Hypertension     Past Surgical History:  Procedure Laterality Date  . CHOLECYSTECTOMY    . ENDARTERECTOMY  06/20/11  . ENDARTERECTOMY  06/20/2011   Procedure: ENDARTERECTOMY CAROTID;  Surgeon: Elam Dutch, MD;  Location: [facility] OR;  Service: Vascular;  Laterality: Right;  Right Carotid endarterectomy with dacron patch angioplasty   . TONSILLECTOMY    . TONSILLECTOMY      Family History  Problem Relation Age of Onset  . Heart disease Mother    Social History:  reports that he has never smoked. He has never used smokeless tobacco. He reports that he does not drink alcohol or use drugs.  Allergies: No Known Allergies  Medications Prior to Admission  Medication Sig Dispense Refill  . ALPRAZolam (XANAX) 0.5 MG tablet TAKE 1 TABLET BY MOUTH EVERY NIGHT AT BEDTIME AS NEEDED FOR SLEEP 60 tablet 1  . aspirin 81 MG tablet Take 81 mg by mouth daily.    Marland Kitchen GINSENG PO Take 450 mg by mouth daily.     Marland Kitchen lisinopril (PRINIVIL,ZESTRIL) 20 MG tablet TAKE 1 TABLET BY MOUTH DAILY 90 tablet 0  . Multiple Vitamins-Minerals (CENTRUM SILVER PO) Take 1 tablet by mouth daily.     . Omega-3 Fatty Acids (FISH OIL) 1200 MG CAPS Take 1,200 mg by mouth daily.     .  propranolol (INDERAL) 20 MG tablet TAKE 1 TABLET BY MOUTH TWICE DAILY. THIS REPLACES NADOLOL. 180 tablet 3  . simvastatin (ZOCOR) 40 MG tablet TAKE 1 TABLET BY MOUTH EVERY EVENING 90 tablet 1  . St Johns Wort 300 MG CAPS Take 1 capsule by mouth daily.     . indomethacin (INDOCIN) 25 MG capsule Take 1 capsule (25 mg total) by mouth 3 (three) times daily as needed. 90 capsule 1  . tadalafil (CIALIS) 5 MG tablet Take 1 tablet (5 mg total) by mouth daily as needed for erectile dysfunction. 30 tablet 4    No results found for this or any previous visit (from the past 48 hour(s)). No results found.  Review of Systems  Constitutional: Negative.   HENT: Negative.   Eyes: Negative.   Respiratory: Negative.   Gastrointestinal: Negative.   Genitourinary: Negative.   Musculoskeletal: Negative.   Skin: Negative.   Neurological: Negative.   Psychiatric/Behavioral: Negative.     Blood pressure (!) 146/66, pulse (!) 55, temperature 98.2 F (36.8 C), temperature source Oral, resp. rate 16, height 5\' 9"  (1.753 m), weight 93.4 kg (206 lb), SpO2 97 %. Physical Exam  Constitutional: He is oriented to person, place, and time. He appears well-developed and  well-nourished.  HENT:  Head: Normocephalic and atraumatic.  Eyes: Pupils are equal, round, and reactive to light.  Cardiovascular: Normal rate.   Respiratory: Effort normal.  GI: Soft.  Neurological: He is alert and oriented to person, place, and time.  Skin: Skin is warm.  Psychiatric: He has a normal mood and affect. His behavior is normal.     Assessment/Plan Excision of melanoma of the scalp with possible closure and Acell placement.  Wallace Going, DO 06/28/2016, 10:22 AM

## 2016-06-28 NOTE — Anesthesia Preprocedure Evaluation (Signed)
Anesthesia Evaluation  Patient identified by MRN, date of birth, ID band Patient awake    Reviewed: Allergy & Precautions, NPO status , Patient's Chart, lab work & pertinent test results, reviewed documented beta blocker date and time   Airway Mallampati: I  TM Distance: >3 FB Neck ROM: Full    Dental  (+) Upper Dentures, Lower Dentures   Pulmonary neg pulmonary ROS,    breath sounds clear to auscultation       Cardiovascular hypertension, Pt. on medications and Pt. on home beta blockers + Peripheral Vascular Disease   Rhythm:Regular Rate:Normal     Neuro/Psych negative neurological ROS  negative psych ROS   GI/Hepatic negative GI ROS, Neg liver ROS,   Endo/Other  negative endocrine ROS  Renal/GU negative Renal ROS  negative genitourinary   Musculoskeletal negative musculoskeletal ROS (+)   Abdominal   Peds negative pediatric ROS (+)  Hematology negative hematology ROS (+)   Anesthesia Other Findings - Carotid A Stenosis - Asymptomatic  Reproductive/Obstetrics negative OB ROS                             Anesthesia Physical Anesthesia Plan  ASA: II  Anesthesia Plan: General   Post-op Pain Management:    Induction: Intravenous  Airway Management Planned: LMA  Additional Equipment:   Intra-op Plan:   Post-operative Plan: Extubation in OR  Informed Consent: I have reviewed the patients History and Physical, chart, labs and discussed the procedure including the risks, benefits and alternatives for the proposed anesthesia with the patient or authorized representative who has indicated his/her understanding and acceptance.   Dental advisory given  Plan Discussed with: CRNA  Anesthesia Plan Comments:         Anesthesia Quick Evaluation

## 2016-06-29 ENCOUNTER — Encounter (HOSPITAL_BASED_OUTPATIENT_CLINIC_OR_DEPARTMENT_OTHER): Payer: Self-pay | Admitting: Plastic Surgery

## 2016-06-30 ENCOUNTER — Encounter (HOSPITAL_BASED_OUTPATIENT_CLINIC_OR_DEPARTMENT_OTHER): Payer: Self-pay | Admitting: Plastic Surgery

## 2016-07-04 DIAGNOSIS — C434 Malignant melanoma of scalp and neck: Secondary | ICD-10-CM | POA: Diagnosis not present

## 2016-07-07 ENCOUNTER — Other Ambulatory Visit: Payer: Self-pay | Admitting: Internal Medicine

## 2016-07-07 ENCOUNTER — Encounter: Payer: Self-pay | Admitting: Internal Medicine

## 2016-07-07 ENCOUNTER — Ambulatory Visit (INDEPENDENT_AMBULATORY_CARE_PROVIDER_SITE_OTHER): Payer: Medicare Other | Admitting: Internal Medicine

## 2016-07-07 VITALS — BP 138/86 | HR 65 | Temp 98.1°F | Ht 68.5 in | Wt 207.8 lb

## 2016-07-07 DIAGNOSIS — I6521 Occlusion and stenosis of right carotid artery: Secondary | ICD-10-CM | POA: Diagnosis not present

## 2016-07-07 DIAGNOSIS — R7302 Impaired glucose tolerance (oral): Secondary | ICD-10-CM | POA: Diagnosis not present

## 2016-07-07 DIAGNOSIS — R946 Abnormal results of thyroid function studies: Secondary | ICD-10-CM

## 2016-07-07 DIAGNOSIS — E7800 Pure hypercholesterolemia, unspecified: Secondary | ICD-10-CM

## 2016-07-07 DIAGNOSIS — I10 Essential (primary) hypertension: Secondary | ICD-10-CM | POA: Diagnosis not present

## 2016-07-07 DIAGNOSIS — R7989 Other specified abnormal findings of blood chemistry: Secondary | ICD-10-CM

## 2016-07-07 MED ORDER — SILDENAFIL CITRATE 20 MG PO TABS: ORAL_TABLET | ORAL | 4 refills | Status: DC

## 2016-07-07 MED ORDER — INDOMETHACIN 25 MG PO CAPS: 25.0000 mg | ORAL_CAPSULE | Freq: Three times a day (TID) | ORAL | 4 refills | Status: DC | PRN

## 2016-07-07 NOTE — Progress Notes (Signed)
Subjective:    Patient ID: Randall Mcgee, male    DOB: 1940/09/17, 75 y.o.   MRN: [id number]  HPI 75 year old patient who is seen today for follow-up. Initially appointment was scheduled to discuss a mildly symptomatic right inguinal hernia.  He has very little pain, but is bothered somewhat by the swelling. He has had recent resection of a recurrent melanoma involving the scalp area.  Unfortunately, Residual malignant melanoma noted.  Postprocedure.  Understandably, he is quite concerned.  He is scheduled for repeat surgery in 7 days He has a history of impaired glucose tolerance and essential hypertension  Past Medical History:  Diagnosis Date  . History of chicken pox   . Hyperlipidemia   . Hypertension      Social History   Social History  . Marital status: Married    Spouse name: N/A  . Number of children: N/A  . Years of education: N/A   Occupational History  . Not on file.   Social History Main Topics  . Smoking status: Never Smoker  . Smokeless tobacco: Never Used  . Alcohol use No  . Drug use: No  . Sexual activity: Not on file   Other Topics Concern  . Not on file   Social History Narrative  . No narrative on file    Past Surgical History:  Procedure Laterality Date  . APPLICATION OF A-CELL OF HEAD/NECK N/A 06/28/2016   Procedure: APPLICATION OF A-CELL OF HEAD/NECK;  Surgeon: Wallace Going, DO;  Location: [place];  Service: Plastics;  Laterality: N/A;  . CHOLECYSTECTOMY    . ENDARTERECTOMY  06/20/11  . ENDARTERECTOMY  06/20/2011   Procedure: ENDARTERECTOMY CAROTID;  Surgeon: Elam Dutch, MD;  Location: [facility] OR;  Service: Vascular;  Laterality: Right;  Right Carotid endarterectomy with dacron patch angioplasty   . MASS EXCISION N/A 06/28/2016   Procedure: EXCISION SCALP MELANOMA;  Surgeon: Wallace Going, DO;  Location: [place];  Service: Plastics;  Laterality: N/A;  . TONSILLECTOMY    . TONSILLECTOMY        Family History  Problem Relation Age of Onset  . Heart disease Mother     No Known Allergies  Current Outpatient Prescriptions on File Prior to Visit  Medication Sig Dispense Refill  . ALPRAZolam (XANAX) 0.5 MG tablet TAKE 1 TABLET BY MOUTH EVERY NIGHT AT BEDTIME AS NEEDED FOR SLEEP 60 tablet 1  . aspirin 81 MG tablet Take 81 mg by mouth daily.    Marland Kitchen GINSENG PO Take 450 mg by mouth daily.     Marland Kitchen HYDROcodone-acetaminophen (NORCO) 5-325 MG tablet Take 1 tablet by mouth every 6 (six) hours as needed for moderate pain or severe pain. 30 tablet 0  . lisinopril (PRINIVIL,ZESTRIL) 20 MG tablet TAKE 1 TABLET BY MOUTH DAILY 90 tablet 0  . Multiple Vitamins-Minerals (CENTRUM SILVER PO) Take 1 tablet by mouth daily.     . Omega-3 Fatty Acids (FISH OIL) 1200 MG CAPS Take 1,200 mg by mouth daily.     . propranolol (INDERAL) 20 MG tablet TAKE 1 TABLET BY MOUTH TWICE DAILY. THIS REPLACES NADOLOL. 180 tablet 3  . simvastatin (ZOCOR) 40 MG tablet TAKE 1 TABLET BY MOUTH EVERY EVENING 90 tablet 1  . St Johns Wort 300 MG CAPS Take 1 capsule by mouth daily.      No current facility-administered medications on file prior to visit.     BP 138/86 (BP Location: Right Arm, Patient Position: Sitting,  Cuff Size: Normal)   Pulse 65   Temp 98.1 F (36.7 C) (Oral)   Ht 5' 8.5" (1.74 m)   Wt 207 lb 12 oz (94.2 kg)   SpO2 97%   BMI 31.13 kg/m       Review of Systems  Constitutional: Negative for appetite change, chills, fatigue and fever.  HENT: Negative for congestion, dental problem, ear pain, hearing loss, sore throat, tinnitus, trouble swallowing and voice change.   Eyes: Negative for pain, discharge and visual disturbance.  Respiratory: Negative for cough, chest tightness, wheezing and stridor.   Cardiovascular: Negative for chest pain, palpitations and leg swelling.  Gastrointestinal: Positive for abdominal distention. Negative for abdominal pain, blood in stool, constipation, diarrhea, nausea  and vomiting.  Genitourinary: Negative for difficulty urinating, discharge, flank pain, genital sores, hematuria and urgency.  Musculoskeletal: Negative for arthralgias, back pain, gait problem, joint swelling, myalgias and neck stiffness.  Skin: Positive for wound. Negative for rash.  Neurological: Negative for dizziness, syncope, speech difficulty, weakness, numbness and headaches.  Hematological: Negative for adenopathy. Does not bruise/bleed easily.  Psychiatric/Behavioral: Negative for behavioral problems and dysphoric mood. The patient is not nervous/anxious.        Objective:   Physical Exam  Constitutional: He appears well-nourished. No distress.  Blood pressure 130/80  Abdominal:  Prominent right inguinal hernia, reducible  Skin:  Vertex of the scalp bandaged           Assessment & Plan:   Residual malignant melanoma scalp lesion.  The patient will have complete excision performed next week.  Situation discussed at length History of hypertension Right inguinal hernia.  Discussed at length.  Patient will observe over the next 6 months and consider surgery if symptoms intensify  Follow-up 6 months Low-salt diet recommended Home blood pressure monitoring.  Encouraged  Nyoka Cowden

## 2016-07-07 NOTE — Patient Instructions (Signed)
Limit your sodium (Salt) intake  Please check your blood pressure on a regular basis.  If it is consistently greater than 150/90, please make an office appointment.    It is important that you exercise regularly, at least 20 minutes 3 to 4 times per week.  If you develop chest pain or shortness of breath seek  medical attention.  Return in 6 months for follow-up  

## 2016-07-07 NOTE — Progress Notes (Signed)
Pre visit review using our clinic review tool, if applicable. No additional management support is needed unless otherwise documented below in the visit note. 

## 2016-07-11 ENCOUNTER — Other Ambulatory Visit: Payer: Self-pay | Admitting: Internal Medicine

## 2016-07-11 ENCOUNTER — Encounter (HOSPITAL_BASED_OUTPATIENT_CLINIC_OR_DEPARTMENT_OTHER): Payer: Self-pay | Admitting: *Deleted

## 2016-07-11 DIAGNOSIS — D034 Melanoma in situ of scalp and neck: Secondary | ICD-10-CM | POA: Diagnosis not present

## 2016-07-12 ENCOUNTER — Ambulatory Visit: Payer: Self-pay | Admitting: Plastic Surgery

## 2016-07-12 NOTE — H&P (Signed)
Randall Mcgee is an 75 y.o. male.   Chief Complaint: melanoma HPI: The patient is a 75 y.o. yrs old wm here with his wife for follow up of melanoma in situ of his scalp. He underwent excision in the OR 06/28/16. History:   He was diagnosed with scalp melanoma 5 years ago and treated with Mohs excision.  He noted a new lesion at the scar recently.  A biopsy showed a malignant melanoma 1.14 mm in thickness the deep margin was negative with the peripheral marin positive.  He underwent a workup by Dr. .Grandville Silos in General surgery for lymph nodes which was negative.  The area was ~ 4 cm at the scalp in the site of the previous scar excision.   Pathology = positive margins laterally from 8 to 10 o'clock .   Past Medical History:  Diagnosis Date  . History of chicken pox   . Hyperlipidemia   . Hypertension     Past Surgical History:  Procedure Laterality Date  . APPLICATION OF A-CELL OF HEAD/NECK N/A 06/28/2016   Procedure: APPLICATION OF A-CELL OF HEAD/NECK;  Surgeon: Wallace Going, DO;  Location: [place];  Service: Plastics;  Laterality: N/A;  . CHOLECYSTECTOMY    . ENDARTERECTOMY  06/20/11  . ENDARTERECTOMY  06/20/2011   Procedure: ENDARTERECTOMY CAROTID;  Surgeon: Elam Dutch, MD;  Location: [facility] OR;  Service: Vascular;  Laterality: Right;  Right Carotid endarterectomy with dacron patch angioplasty   . MASS EXCISION N/A 06/28/2016   Procedure: EXCISION SCALP MELANOMA;  Surgeon: Wallace Going, DO;  Location: [place];  Service: Plastics;  Laterality: N/A;  . TONSILLECTOMY    . TONSILLECTOMY      Family History  Problem Relation Age of Onset  . Heart disease Mother    Social History:  reports that he has never smoked. He has never used smokeless tobacco. He reports that he does not drink alcohol or use drugs.  Allergies: No Known Allergies   (Not in a hospital admission)  No results found for this or any previous visit (from the past  48 hour(s)). No results found.  Review of Systems  Constitutional: Negative.   Eyes: Negative.   Respiratory: Negative.   Cardiovascular: Negative.   Gastrointestinal: Negative.   Genitourinary: Negative.   Musculoskeletal: Negative.   Skin: Negative.   Psychiatric/Behavioral: Negative.     There were no vitals taken for this visit. Physical Exam  Constitutional: He appears well-developed and well-nourished.  HENT:  Head: Normocephalic.  Eyes: Pupils are equal, round, and reactive to light.  Cardiovascular: Normal rate.   Respiratory: Effort normal.  GI: Soft.  Neurological: He is alert.  Skin: Skin is warm.  Psychiatric: He has a normal mood and affect. His behavior is normal. Thought content normal.     Assessment/Plan Plan for excision of scalp melanoma with placement of Acell.  Wallace Going, DO 07/12/2016, 12:27 PM

## 2016-07-13 ENCOUNTER — Ambulatory Visit (HOSPITAL_BASED_OUTPATIENT_CLINIC_OR_DEPARTMENT_OTHER): Payer: Medicare Other | Admitting: Anesthesiology

## 2016-07-13 ENCOUNTER — Encounter (HOSPITAL_BASED_OUTPATIENT_CLINIC_OR_DEPARTMENT_OTHER): Payer: Self-pay | Admitting: *Deleted

## 2016-07-13 ENCOUNTER — Encounter (HOSPITAL_BASED_OUTPATIENT_CLINIC_OR_DEPARTMENT_OTHER)
Admission: RE | Disposition: A | Discharge status: Home / Self Care | Payer: Self-pay | Source: Ambulatory Visit | Attending: Plastic Surgery

## 2016-07-13 ENCOUNTER — Ambulatory Visit (HOSPITAL_BASED_OUTPATIENT_CLINIC_OR_DEPARTMENT_OTHER)
Admission: RE | Admit: 2016-07-13 | Discharge: 2016-07-13 | Disposition: A | Discharge status: Home / Self Care | Payer: Medicare Other | Source: Ambulatory Visit | Attending: Plastic Surgery | Admitting: Plastic Surgery

## 2016-07-13 DIAGNOSIS — I10 Essential (primary) hypertension: Secondary | ICD-10-CM | POA: Diagnosis not present

## 2016-07-13 DIAGNOSIS — C434 Malignant melanoma of scalp and neck: Secondary | ICD-10-CM | POA: Diagnosis not present

## 2016-07-13 DIAGNOSIS — D034 Melanoma in situ of scalp and neck: Secondary | ICD-10-CM | POA: Diagnosis not present

## 2016-07-13 DIAGNOSIS — E785 Hyperlipidemia, unspecified: Secondary | ICD-10-CM | POA: Insufficient documentation

## 2016-07-13 DIAGNOSIS — E7800 Pure hypercholesterolemia, unspecified: Secondary | ICD-10-CM | POA: Diagnosis not present

## 2016-07-13 DIAGNOSIS — I739 Peripheral vascular disease, unspecified: Secondary | ICD-10-CM | POA: Insufficient documentation

## 2016-07-13 DIAGNOSIS — C439 Malignant melanoma of skin, unspecified: Secondary | ICD-10-CM | POA: Diagnosis present

## 2016-07-13 HISTORY — PX: MASS EXCISION: SHX2000

## 2016-07-13 HISTORY — PX: APPLICATION OF A-CELL OF EXTREMITY: SHX6303

## 2016-07-13 SURGERY — EXCISION MASS
Anesthesia: General | Site: Head

## 2016-07-13 MED ORDER — LACTATED RINGERS IV SOLN
INTRAVENOUS | Status: DC
  Administered 2016-07-13: 09:00:00 via INTRAVENOUS

## 2016-07-13 MED ORDER — EPHEDRINE 5 MG/ML INJ
INTRAVENOUS | Status: AC
  Filled 2016-07-13: qty 10

## 2016-07-13 MED ORDER — HYDROCODONE-ACETAMINOPHEN 7.5-325 MG PO TABS: 1.0000 | ORAL_TABLET | Freq: Once | ORAL | Status: DC | PRN

## 2016-07-13 MED ORDER — EPHEDRINE SULFATE 50 MG/ML IJ SOLN
INTRAMUSCULAR | Status: DC | PRN
  Administered 2016-07-13: 15 mg via INTRAVENOUS

## 2016-07-13 MED ORDER — FENTANYL CITRATE (PF) 100 MCG/2ML IJ SOLN
INTRAMUSCULAR | Status: AC
  Filled 2016-07-13: qty 2

## 2016-07-13 MED ORDER — BUPIVACAINE-EPINEPHRINE 0.25% -1:200000 IJ SOLN
INTRAMUSCULAR | Status: DC | PRN
  Administered 2016-07-13: 4 mL

## 2016-07-13 MED ORDER — LIDOCAINE HCL (CARDIAC) 20 MG/ML IV SOLN
INTRAVENOUS | Status: DC | PRN
  Administered 2016-07-13: 50 mg via INTRAVENOUS

## 2016-07-13 MED ORDER — ONDANSETRON HCL 4 MG/2ML IJ SOLN
INTRAMUSCULAR | Status: AC
  Filled 2016-07-13: qty 2

## 2016-07-13 MED ORDER — DEXAMETHASONE SODIUM PHOSPHATE 10 MG/ML IJ SOLN
INTRAMUSCULAR | Status: AC
  Filled 2016-07-13: qty 1

## 2016-07-13 MED ORDER — METOCLOPRAMIDE HCL 5 MG/ML IJ SOLN: 10.0000 mg | Freq: Once | INTRAMUSCULAR | Status: DC | PRN

## 2016-07-13 MED ORDER — PHENYLEPHRINE 40 MCG/ML (10ML) SYRINGE FOR IV PUSH (FOR BLOOD PRESSURE SUPPORT)
PREFILLED_SYRINGE | INTRAVENOUS | Status: AC
  Filled 2016-07-13: qty 10

## 2016-07-13 MED ORDER — PROPOFOL 10 MG/ML IV BOLUS
INTRAVENOUS | Status: DC | PRN
  Administered 2016-07-13: 150 mg via INTRAVENOUS

## 2016-07-13 MED ORDER — MEPERIDINE HCL 25 MG/ML IJ SOLN: 6.2500 mg | INTRAMUSCULAR | Status: DC | PRN

## 2016-07-13 MED ORDER — SCOPOLAMINE 1 MG/3DAYS TD PT72: 1.0000 | MEDICATED_PATCH | Freq: Once | TRANSDERMAL | Status: DC | PRN

## 2016-07-13 MED ORDER — SODIUM CHLORIDE 0.9 % IR SOLN
Status: DC | PRN
  Administered 2016-07-13: 500 mL

## 2016-07-13 MED ORDER — MIDAZOLAM HCL 2 MG/2ML IJ SOLN: 1.0000 mg | INTRAMUSCULAR | Status: DC | PRN

## 2016-07-13 MED ORDER — HYDROCODONE-ACETAMINOPHEN 5-325 MG PO TABS
1.0000 | ORAL_TABLET | Freq: Once | ORAL | Status: AC
  Administered 2016-07-13: 1 via ORAL

## 2016-07-13 MED ORDER — HYDROCODONE-ACETAMINOPHEN 5-325 MG PO TABS
ORAL_TABLET | ORAL | Status: AC
  Filled 2016-07-13: qty 1

## 2016-07-13 MED ORDER — LIDOCAINE 2% (20 MG/ML) 5 ML SYRINGE
INTRAMUSCULAR | Status: AC
  Filled 2016-07-13: qty 5

## 2016-07-13 MED ORDER — FENTANYL CITRATE (PF) 100 MCG/2ML IJ SOLN
50.0000 ug | INTRAMUSCULAR | Status: DC | PRN
  Administered 2016-07-13: 50 ug via INTRAVENOUS

## 2016-07-13 MED ORDER — ONDANSETRON HCL 4 MG/2ML IJ SOLN
INTRAMUSCULAR | Status: DC | PRN
  Administered 2016-07-13: 4 mg via INTRAVENOUS

## 2016-07-13 MED ORDER — FENTANYL CITRATE (PF) 100 MCG/2ML IJ SOLN
25.0000 ug | INTRAMUSCULAR | Status: DC | PRN
  Administered 2016-07-13: 25 ug via INTRAVENOUS

## 2016-07-13 MED ORDER — DEXAMETHASONE SODIUM PHOSPHATE 4 MG/ML IJ SOLN
INTRAMUSCULAR | Status: DC | PRN
  Administered 2016-07-13: 4 mg via INTRAVENOUS

## 2016-07-13 MED ORDER — CEFAZOLIN SODIUM-DEXTROSE 2-4 GM/100ML-% IV SOLN
2.0000 g | INTRAVENOUS | Status: AC
  Administered 2016-07-13: 2 g via INTRAVENOUS

## 2016-07-13 MED ORDER — SUCCINYLCHOLINE CHLORIDE 200 MG/10ML IV SOSY
PREFILLED_SYRINGE | INTRAVENOUS | Status: AC
  Filled 2016-07-13: qty 10

## 2016-07-13 SURGICAL SUPPLY — 99 items
BAG DECANTER FOR FLEXI CONT (MISCELLANEOUS) ×3 IMPLANT
BANDAGE ACE 3X5.8 VEL STRL LF (GAUZE/BANDAGES/DRESSINGS) IMPLANT
BANDAGE ACE 4X5 VEL STRL LF (GAUZE/BANDAGES/DRESSINGS) IMPLANT
BANDAGE ACE 6X5 VEL STRL LF (GAUZE/BANDAGES/DRESSINGS) IMPLANT
BENZOIN TINCTURE PRP APPL 2/3 (GAUZE/BANDAGES/DRESSINGS) IMPLANT
BLADE CLIPPER SURG (BLADE) IMPLANT
BLADE HEX COATED 2.75 (ELECTRODE) IMPLANT
BLADE SURG 10 STRL SS (BLADE) IMPLANT
BLADE SURG 15 STRL LF DISP TIS (BLADE) ×1 IMPLANT
BLADE SURG 15 STRL SS (BLADE) ×2
BNDG COHESIVE 4X5 TAN STRL (GAUZE/BANDAGES/DRESSINGS) IMPLANT
BNDG CONFORM 2 STRL LF (GAUZE/BANDAGES/DRESSINGS) IMPLANT
BNDG ELASTIC 2X5.8 VLCR STR LF (GAUZE/BANDAGES/DRESSINGS) IMPLANT
BNDG GAUZE ELAST 4 BULKY (GAUZE/BANDAGES/DRESSINGS) ×3 IMPLANT
CANISTER SUCT 1200ML W/VALVE (MISCELLANEOUS) ×3 IMPLANT
CHLORAPREP W/TINT 26ML (MISCELLANEOUS) IMPLANT
CLEANER CAUTERY TIP 5X5 PAD (MISCELLANEOUS) IMPLANT
CLOSURE WOUND 1/2 X4 (GAUZE/BANDAGES/DRESSINGS)
CORDS BIPOLAR (ELECTRODE) IMPLANT
COVER BACK TABLE 60X90IN (DRAPES) ×3 IMPLANT
COVER MAYO STAND STRL (DRAPES) ×3 IMPLANT
DECANTER SPIKE VIAL GLASS SM (MISCELLANEOUS) ×3 IMPLANT
DERMABOND ADVANCED (GAUZE/BANDAGES/DRESSINGS)
DERMABOND ADVANCED .7 DNX12 (GAUZE/BANDAGES/DRESSINGS) IMPLANT
DRAPE INCISE IOBAN 66X45 STRL (DRAPES) IMPLANT
DRAPE LAPAROTOMY 100X72 PEDS (DRAPES) IMPLANT
DRAPE U-SHAPE 76X120 STRL (DRAPES) ×3 IMPLANT
DRESSING DUODERM 4X4 STERILE (GAUZE/BANDAGES/DRESSINGS) IMPLANT
DRSG ADAPTIC 3X8 NADH LF (GAUZE/BANDAGES/DRESSINGS) ×3 IMPLANT
DRSG EMULSION OIL 3X3 NADH (GAUZE/BANDAGES/DRESSINGS) IMPLANT
DRSG PAD ABDOMINAL 8X10 ST (GAUZE/BANDAGES/DRESSINGS) IMPLANT
DRSG TEGADERM 2-3/8X2-3/4 SM (GAUZE/BANDAGES/DRESSINGS) IMPLANT
DRSG TEGADERM 4X4.75 (GAUZE/BANDAGES/DRESSINGS) IMPLANT
ELECT COATED BLADE 2.86 ST (ELECTRODE) IMPLANT
ELECT NEEDLE BLADE 2-5/6 (NEEDLE) IMPLANT
ELECT REM PT RETURN 9FT ADLT (ELECTROSURGICAL) ×3
ELECT REM PT RETURN 9FT PED (ELECTROSURGICAL)
ELECTRODE REM PT RETRN 9FT PED (ELECTROSURGICAL) IMPLANT
ELECTRODE REM PT RTRN 9FT ADLT (ELECTROSURGICAL) ×1 IMPLANT
GAUZE SPONGE 4X4 12PLY STRL (GAUZE/BANDAGES/DRESSINGS) ×3 IMPLANT
GLOVE BIO SURGEON STRL SZ 6.5 (GLOVE) ×6 IMPLANT
GLOVE BIO SURGEONS STRL SZ 6.5 (GLOVE) ×3
GLOVE BIOGEL PI IND STRL 6.5 (GLOVE) ×1 IMPLANT
GLOVE BIOGEL PI INDICATOR 6.5 (GLOVE) ×2
GLOVE ECLIPSE 6.5 STRL STRAW (GLOVE) ×3 IMPLANT
GOWN STRL REUS W/ TWL LRG LVL3 (GOWN DISPOSABLE) ×2 IMPLANT
GOWN STRL REUS W/TWL LRG LVL3 (GOWN DISPOSABLE) ×4
MATRIX SURGICAL PSM 7X10CM (Tissue) ×3 IMPLANT
MICROMATRIX 1000MG (Tissue) ×3 IMPLANT
NEEDLE HYPO 25X1 1.5 SAFETY (NEEDLE) IMPLANT
NEEDLE HYPO 30GX1 BEV (NEEDLE) IMPLANT
NEEDLE PRECISIONGLIDE 27X1.5 (NEEDLE) ×3 IMPLANT
NS IRRIG 1000ML POUR BTL (IV SOLUTION) ×3 IMPLANT
PACK BASIN DAY SURGERY FS (CUSTOM PROCEDURE TRAY) ×3 IMPLANT
PAD CLEANER CAUTERY TIP 5X5 (MISCELLANEOUS)
PADDING CAST ABS 3INX4YD NS (CAST SUPPLIES)
PADDING CAST ABS 4INX4YD NS (CAST SUPPLIES)
PADDING CAST ABS COTTON 3X4 (CAST SUPPLIES) IMPLANT
PADDING CAST ABS COTTON 4X4 ST (CAST SUPPLIES) IMPLANT
PENCIL BUTTON HOLSTER BLD 10FT (ELECTRODE) IMPLANT
RUBBERBAND STERILE (MISCELLANEOUS) IMPLANT
SHEET MEDIUM DRAPE 40X70 STRL (DRAPES) ×3 IMPLANT
SLEEVE SCD COMPRESS KNEE MED (MISCELLANEOUS) ×3 IMPLANT
SOLUTION PARTIC MCRMTRX 1000MG (Tissue) ×1 IMPLANT
SPLINT FIBERGLASS 3X35 (CAST SUPPLIES) ×3 IMPLANT
SPLINT FIBERGLASS 4X30 (CAST SUPPLIES) IMPLANT
SPONGE GAUZE 2X2 8PLY STER LF (GAUZE/BANDAGES/DRESSINGS)
SPONGE GAUZE 2X2 8PLY STRL LF (GAUZE/BANDAGES/DRESSINGS) IMPLANT
SPONGE GAUZE 4X4 12PLY STER LF (GAUZE/BANDAGES/DRESSINGS) ×3 IMPLANT
SPONGE LAP 18X18 X RAY DECT (DISPOSABLE) ×3 IMPLANT
STAPLER VISISTAT 35W (STAPLE) IMPLANT
STOCKINETTE IMPERVIOUS LG (DRAPES) IMPLANT
STRIP CLOSURE SKIN 1/2X4 (GAUZE/BANDAGES/DRESSINGS) IMPLANT
SUCTION FRAZIER HANDLE 10FR (MISCELLANEOUS)
SUCTION TUBE FRAZIER 10FR DISP (MISCELLANEOUS) IMPLANT
SURGILUBE 2OZ TUBE FLIPTOP (MISCELLANEOUS) ×3 IMPLANT
SUT MNCRL 6-0 UNDY P1 1X18 (SUTURE) IMPLANT
SUT MNCRL AB 3-0 PS2 18 (SUTURE) ×6 IMPLANT
SUT MNCRL AB 4-0 PS2 18 (SUTURE) IMPLANT
SUT MON AB 5-0 P3 18 (SUTURE) IMPLANT
SUT MON AB 5-0 PS2 18 (SUTURE) IMPLANT
SUT MONOCRYL 6-0 P1 1X18 (SUTURE)
SUT PROLENE 5 0 P 3 (SUTURE) IMPLANT
SUT PROLENE 5 0 PS 2 (SUTURE) IMPLANT
SUT PROLENE 6 0 P 1 18 (SUTURE) IMPLANT
SUT SILK 3 0 PS 1 (SUTURE) ×6 IMPLANT
SUT VIC AB 5-0 P-3 18X BRD (SUTURE) ×3 IMPLANT
SUT VIC AB 5-0 P3 18 (SUTURE) ×6
SUT VIC AB 5-0 PS2 18 (SUTURE) ×3 IMPLANT
SUT VICRYL 4-0 PS2 18IN ABS (SUTURE) IMPLANT
SYR BULB 3OZ (MISCELLANEOUS) IMPLANT
SYR BULB IRRIGATION 50ML (SYRINGE) ×3 IMPLANT
SYR CONTROL 10ML LL (SYRINGE) ×3 IMPLANT
TOWEL OR 17X24 6PK STRL BLUE (TOWEL DISPOSABLE) ×3 IMPLANT
TRAY DSU PREP LF (CUSTOM PROCEDURE TRAY) ×3 IMPLANT
TUBE CONNECTING 20'X1/4 (TUBING) ×1
TUBE CONNECTING 20X1/4 (TUBING) ×2 IMPLANT
UNDERPAD 30X30 (UNDERPADS AND DIAPERS) ×3 IMPLANT
YANKAUER SUCT BULB TIP NO VENT (SUCTIONS) ×3 IMPLANT

## 2016-07-13 NOTE — Brief Op Note (Signed)
07/13/2016  9:51 AM  PATIENT:  Randall Mcgee  74 y.o. male  PRE-OPERATIVE DIAGNOSIS:  MELANOMA OF SCALP  POST-OPERATIVE DIAGNOSIS:  MELANOMA OF SCALP  PROCEDURE:  Procedure(s): RE- EXCISION OF SCALP MELANOMA (N/A) APPLICATION OF A-CELL (N/A)  SURGEON:  Surgeon(s) and Role:    * Patryck Kilgore S Cornella Emmer, DO - Primary  PHYSICIAN ASSISTANT:   ASSISTANTS: none   ANESTHESIA:   local and general  EBL:  No intake/output data recorded.  BLOOD ADMINISTERED:none  DRAINS: none   LOCAL MEDICATIONS USED:  MARCAINE     SPECIMEN:  Source of Specimen:  scalp  DISPOSITION OF SPECIMEN:  PATHOLOGY  COUNTS:  YES  TOURNIQUET:  * No tourniquets in log *  DICTATION: .Dragon Dictation  PLAN OF CARE: Discharge to home after PACU  PATIENT DISPOSITION:  PACU - hemodynamically stable.   Delay start of Pharmacological VTE agent (>24hrs) due to surgical blood loss or risk of bleeding: no

## 2016-07-13 NOTE — Anesthesia Postprocedure Evaluation (Signed)
Anesthesia Post Note  Patient: Randall Mcgee  Procedure(s) Performed: Procedure(s) (LRB): RE- EXCISION OF SCALP MELANOMA (N/A) APPLICATION OF A-CELL (N/A)  Patient location during evaluation: PACU Anesthesia Type: General Level of consciousness: awake and alert and oriented Pain management: pain level controlled Vital Signs Assessment: post-procedure vital signs reviewed and stable Respiratory status: spontaneous breathing, nonlabored ventilation and respiratory function stable Cardiovascular status: blood pressure returned to baseline and stable Postop Assessment: no signs of nausea or vomiting Anesthetic complications: no       Last Vitals:  Vitals:   07/13/16 1041 07/13/16 1045  BP: 136/71 138/71  Pulse: 64 66  Resp: 18 (!) 9  Temp: 36.6 C     Last Pain:  Vitals:   07/13/16 1041  TempSrc:   PainSc: 6                  [name] A.

## 2016-07-13 NOTE — Discharge Instructions (Signed)
KY gel to the scalp daily starting Saturday.   Post Anesthesia Home Care Instructions  Activity: Get plenty of rest for the remainder of the day. A responsible adult should stay with you for 24 hours following the procedure.  For the next 24 hours, DO NOT: -Drive a car -Paediatric nurse -Drink alcoholic beverages -Take any medication unless instructed by your physician -Make any legal decisions or sign important papers.  Meals: Start with liquid foods such as gelatin or soup. Progress to regular foods as tolerated. Avoid greasy, spicy, heavy foods. If nausea and/or vomiting occur, drink only clear liquids until the nausea and/or vomiting subsides. Call your physician if vomiting continues.  Special Instructions/Symptoms: Your throat may feel dry or sore from the anesthesia or the breathing tube placed in your throat during surgery. If this causes discomfort, gargle with warm salt water. The discomfort should disappear within 24 hours.  If you had a scopolamine patch placed behind your ear for the management of post- operative nausea and/or vomiting:  1. The medication in the patch is effective for 72 hours, after which it should be removed.  Wrap patch in a tissue and discard in the trash. Wash hands thoroughly with soap and water. 2. You may remove the patch earlier than 72 hours if you experience unpleasant side effects which may include dry mouth, dizziness or visual disturbances. 3. Avoid touching the patch. Wash your hands with soap and water after contact with the patch.

## 2016-07-13 NOTE — Transfer of Care (Signed)
Immediate Anesthesia Transfer of Care Note  Patient: Randall Mcgee  Procedure(s) Performed: Procedure(s): RE- EXCISION OF SCALP MELANOMA (N/A) APPLICATION OF A-CELL (N/A)  Patient Location: PACU  Anesthesia Type:General  Level of Consciousness: awake, alert  and oriented  Airway & Oxygen Therapy: Patient Spontanous Breathing and Patient connected to face mask oxygen  Post-op Assessment: Report given to RN and Post -op Vital signs reviewed and stable  Post vital signs: Reviewed and stable  Last Vitals:   Vitals:   07/13/16 0915  BP: (!) 157/59  Pulse: (!) 54  Resp: 18  Temp: 36.9 C    Last Pain:  Vitals:   07/13/16 0915  TempSrc: Oral  PainSc: 0-No pain      Patients Stated Pain Goal: 0 ([id number])  Complications: No apparent anesthesia complications

## 2016-07-13 NOTE — Interval H&P Note (Signed)
History and Physical Interval Note:  07/13/2016 9:33 AM  Randall Mcgee  has presented today for surgery, with the diagnosis of MELANOMA OF SCALP  The various methods of treatment have been discussed with the patient and family. After consideration of risks, benefits and other options for treatment, the patient has consented to  Procedure(s): RE- EXCISION OF SCALP MELANOMA (N/A) APPLICATION OF A-CELL (N/A) as a surgical intervention .  The patient's history has been reviewed, patient examined, no change in status, stable for surgery.  I have reviewed the patient's chart and labs.  Questions were answered to the patient's satisfaction.     Wallace Going

## 2016-07-13 NOTE — Op Note (Signed)
DATE OF OPERATION: 07/13/2016  LOCATION: [facility]  PREOPERATIVE DIAGNOSIS: scalp melanoma  POSTOPERATIVE DIAGNOSIS: Same  PROCEDURE:  1. Re-excision of scalp melanoma anterior left 1 x 4 cm and posterior right 1 x 4 cm 2. Placement of Acell powder 1 gm and 7 x 10 cm  SURGEON: Claire Sanger Dillingham, DO  EBL: less than 20 cc  CONDITION: Stable  COMPLICATIONS: None  INDICATION: The patient, Randall Mcgee, is a 75 y.o. male born on Oct 07, 1940, is here for treatment of scalp melanoma.  He had an excision and the margins were microscopically positive.   PROCEDURE DETAILS:  The patient was seen prior to surgery and marked.  The IV antibiotics were given. The patient was taken to the operating room and given a general anesthetic. A standard time out was performed and all information was confirmed by those in the room. SCDs were placed.  Local was injected around the area. The anterior left and posterior right was excised with 1 x 4 cm area of each.  A long stitch was placed anterior and short stitch posterior.  Both sutures were adjacent to the wound.  The area was irrigated with antibiotic solution.  Hemostasis was achieved with electrocautery.  The Acell powder and sheet were appllied and secured with 5-0 Vicryl.  The adaptic was placed over the area with KY gel.  The patient was allowed to wake up and taken to recovery room in stable condition at the end of the case. The family was notified at the end of the case.

## 2016-07-13 NOTE — Anesthesia Procedure Notes (Signed)
Procedure Name: LMA Insertion Date/Time: 07/13/2016 9:56 AM Performed by: Melynda Ripple D Pre-anesthesia Checklist: Patient identified, Emergency Drugs available, Suction available and Patient being monitored Patient Re-evaluated:Patient Re-evaluated prior to inductionOxygen Delivery Method: Circle system utilized Preoxygenation: Pre-oxygenation with 100% oxygen Intubation Type: IV induction Ventilation: Mask ventilation without difficulty LMA: LMA inserted LMA Size: 4.0 Number of attempts: 1 Airway Equipment and Method: Bite block Placement Confirmation: positive ETCO2 Tube secured with: Tape Dental Injury: Teeth and Oropharynx as per pre-operative assessment

## 2016-07-13 NOTE — Anesthesia Preprocedure Evaluation (Signed)
Anesthesia Evaluation  Patient identified by MRN, date of birth, ID band Patient awake    Reviewed: Allergy & Precautions, NPO status , Patient's Chart, lab work & pertinent test results, reviewed documented beta blocker date and time   Airway Mallampati: I  TM Distance: >3 FB Neck ROM: Full    Dental  (+) Upper Dentures, Lower Dentures   Pulmonary neg pulmonary ROS,    breath sounds clear to auscultation       Cardiovascular hypertension, Pt. on medications and Pt. on home beta blockers + Peripheral Vascular Disease   Rhythm:Regular Rate:Normal  Hx/o carotid stenosis S/P carotid endarterectomy   Neuro/Psych negative neurological ROS  negative psych ROS   GI/Hepatic negative GI ROS, Neg liver ROS,   Endo/Other  negative endocrine ROS  Renal/GU negative Renal ROS  negative genitourinary   Musculoskeletal Melanoma scalp   Abdominal   Peds negative pediatric ROS (+)  Hematology negative hematology ROS (+)   Anesthesia Other Findings - Carotid A Stenosis - Asymptomatic  Reproductive/Obstetrics negative OB ROS                               Chemistry      Component Value Date/Time   NA 145 07/20/2015 0831   K 4.8 07/20/2015 0831   CL 108 07/20/2015 0831   CO2 29 07/20/2015 0831   BUN 21 07/20/2015 0831   CREATININE 1.20 07/20/2015 0831      Component Value Date/Time   CALCIUM 9.5 07/20/2015 0831   ALKPHOS 37 (L) 07/20/2015 0831   AST 27 07/20/2015 0831   ALT 17 07/20/2015 0831   BILITOT 0.5 07/20/2015 0831     Lab Results  Component Value Date   WBC 6.8 07/20/2015   HGB 13.4 07/20/2015   HCT 40.7 07/20/2015   MCV 98.8 07/20/2015   PLT 194.0 07/20/2015   EKG: normal sinus rhythm, LAD, LVH, incomplete RBBB, non specific ST-T wave abnormality.  Anesthesia Physical  Anesthesia Plan  ASA: II  Anesthesia Plan: General   Post-op Pain Management:    Induction:  Intravenous  Airway Management Planned: LMA  Additional Equipment:   Intra-op Plan:   Post-operative Plan: Extubation in OR  Informed Consent: I have reviewed the patients History and Physical, chart, labs and discussed the procedure including the risks, benefits and alternatives for the proposed anesthesia with the patient or authorized representative who has indicated his/her understanding and acceptance.   Dental advisory given  Plan Discussed with: CRNA, Anesthesiologist and Surgeon  Anesthesia Plan Comments:         Anesthesia Quick Evaluation

## 2016-07-13 NOTE — H&P (View-Only) (Signed)
Randall Mcgee is an 75 y.o. male.   Chief Complaint: melanoma HPI: The patient is a 75 y.o. yrs old wm here with his wife for follow up of melanoma in situ of his scalp. He underwent excision in the OR 06/28/16. History:   He was diagnosed with scalp melanoma 5 years ago and treated with Mohs excision.  He noted a new lesion at the scar recently.  A biopsy showed a malignant melanoma 1.14 mm in thickness the deep margin was negative with the peripheral marin positive.  He underwent a workup by Dr. .Grandville Silos in General surgery for lymph nodes which was negative.  The area was ~ 4 cm at the scalp in the site of the previous scar excision.   Pathology = positive margins laterally from 8 to 10 o'clock .   Past Medical History:  Diagnosis Date  . History of chicken pox   . Hyperlipidemia   . Hypertension     Past Surgical History:  Procedure Laterality Date  . APPLICATION OF A-CELL OF HEAD/NECK N/A 06/28/2016   Procedure: APPLICATION OF A-CELL OF HEAD/NECK;  Surgeon: Wallace Going, DO;  Location: [place];  Service: Plastics;  Laterality: N/A;  . CHOLECYSTECTOMY    . ENDARTERECTOMY  06/20/11  . ENDARTERECTOMY  06/20/2011   Procedure: ENDARTERECTOMY CAROTID;  Surgeon: Elam Dutch, MD;  Location: [facility] OR;  Service: Vascular;  Laterality: Right;  Right Carotid endarterectomy with dacron patch angioplasty   . MASS EXCISION N/A 06/28/2016   Procedure: EXCISION SCALP MELANOMA;  Surgeon: Wallace Going, DO;  Location: [place];  Service: Plastics;  Laterality: N/A;  . TONSILLECTOMY    . TONSILLECTOMY      Family History  Problem Relation Age of Onset  . Heart disease Mother    Social History:  reports that he has never smoked. He has never used smokeless tobacco. He reports that he does not drink alcohol or use drugs.  Allergies: No Known Allergies   (Not in a hospital admission)  No results found for this or any previous visit (from the past  48 hour(s)). No results found.  Review of Systems  Constitutional: Negative.   Eyes: Negative.   Respiratory: Negative.   Cardiovascular: Negative.   Gastrointestinal: Negative.   Genitourinary: Negative.   Musculoskeletal: Negative.   Skin: Negative.   Psychiatric/Behavioral: Negative.     There were no vitals taken for this visit. Physical Exam  Constitutional: He appears well-developed and well-nourished.  HENT:  Head: Normocephalic.  Eyes: Pupils are equal, round, and reactive to light.  Cardiovascular: Normal rate.   Respiratory: Effort normal.  GI: Soft.  Neurological: He is alert.  Skin: Skin is warm.  Psychiatric: He has a normal mood and affect. His behavior is normal. Thought content normal.     Assessment/Plan Plan for excision of scalp melanoma with placement of Acell.  Wallace Going, DO 07/12/2016, 12:27 PM

## 2016-07-21 ENCOUNTER — Encounter (HOSPITAL_BASED_OUTPATIENT_CLINIC_OR_DEPARTMENT_OTHER): Payer: Self-pay | Admitting: *Deleted

## 2016-07-21 ENCOUNTER — Ambulatory Visit: Payer: Medicare Other | Admitting: Internal Medicine

## 2016-07-25 ENCOUNTER — Ambulatory Visit: Payer: Self-pay | Admitting: Plastic Surgery

## 2016-07-25 DIAGNOSIS — C439 Malignant melanoma of skin, unspecified: Secondary | ICD-10-CM

## 2016-07-26 ENCOUNTER — Ambulatory Visit (HOSPITAL_BASED_OUTPATIENT_CLINIC_OR_DEPARTMENT_OTHER): Payer: Medicare Other | Admitting: Anesthesiology

## 2016-07-26 ENCOUNTER — Encounter (HOSPITAL_BASED_OUTPATIENT_CLINIC_OR_DEPARTMENT_OTHER)
Admission: RE | Disposition: A | Discharge status: Home / Self Care | Payer: Self-pay | Source: Ambulatory Visit | Attending: Plastic Surgery

## 2016-07-26 ENCOUNTER — Ambulatory Visit (HOSPITAL_BASED_OUTPATIENT_CLINIC_OR_DEPARTMENT_OTHER)
Admission: RE | Admit: 2016-07-26 | Discharge: 2016-07-26 | Disposition: A | Discharge status: Home / Self Care | Payer: Medicare Other | Source: Ambulatory Visit | Attending: Plastic Surgery | Admitting: Plastic Surgery

## 2016-07-26 ENCOUNTER — Encounter (HOSPITAL_BASED_OUTPATIENT_CLINIC_OR_DEPARTMENT_OTHER): Payer: Self-pay | Admitting: Anesthesiology

## 2016-07-26 DIAGNOSIS — C434 Malignant melanoma of scalp and neck: Secondary | ICD-10-CM | POA: Diagnosis not present

## 2016-07-26 DIAGNOSIS — E785 Hyperlipidemia, unspecified: Secondary | ICD-10-CM | POA: Insufficient documentation

## 2016-07-26 DIAGNOSIS — E7800 Pure hypercholesterolemia, unspecified: Secondary | ICD-10-CM | POA: Diagnosis not present

## 2016-07-26 DIAGNOSIS — C439 Malignant melanoma of skin, unspecified: Secondary | ICD-10-CM

## 2016-07-26 DIAGNOSIS — I10 Essential (primary) hypertension: Secondary | ICD-10-CM | POA: Diagnosis not present

## 2016-07-26 DIAGNOSIS — K4090 Unilateral inguinal hernia, without obstruction or gangrene, not specified as recurrent: Secondary | ICD-10-CM | POA: Diagnosis not present

## 2016-07-26 DIAGNOSIS — L905 Scar conditions and fibrosis of skin: Secondary | ICD-10-CM | POA: Diagnosis not present

## 2016-07-26 DIAGNOSIS — D034 Melanoma in situ of scalp and neck: Secondary | ICD-10-CM | POA: Insufficient documentation

## 2016-07-26 HISTORY — DX: Malignant (primary) neoplasm, unspecified: C80.1

## 2016-07-26 HISTORY — PX: MASS EXCISION: SHX2000

## 2016-07-26 HISTORY — PX: APPLICATION OF A-CELL OF HEAD/NECK: SHX6304

## 2016-07-26 SURGERY — EXCISION MASS
Anesthesia: General | Site: Head

## 2016-07-26 MED ORDER — CEFAZOLIN SODIUM-DEXTROSE 2-4 GM/100ML-% IV SOLN
2.0000 g | INTRAVENOUS | Status: AC
  Administered 2016-07-26: 2 g via INTRAVENOUS

## 2016-07-26 MED ORDER — PROPOFOL 10 MG/ML IV BOLUS
INTRAVENOUS | Status: DC | PRN
  Administered 2016-07-26: 150 mg via INTRAVENOUS

## 2016-07-26 MED ORDER — HYDROMORPHONE HCL 1 MG/ML IJ SOLN
INTRAMUSCULAR | Status: AC
  Filled 2016-07-26: qty 1

## 2016-07-26 MED ORDER — MIDAZOLAM HCL 2 MG/2ML IJ SOLN: 1.0000 mg | INTRAMUSCULAR | Status: DC | PRN

## 2016-07-26 MED ORDER — EPHEDRINE SULFATE 50 MG/ML IJ SOLN
INTRAMUSCULAR | Status: DC | PRN
  Administered 2016-07-26: 10 mg via INTRAVENOUS

## 2016-07-26 MED ORDER — HYDROMORPHONE HCL 1 MG/ML IJ SOLN
0.2500 mg | INTRAMUSCULAR | Status: DC | PRN
  Administered 2016-07-26 (×2): 0.5 mg via INTRAVENOUS

## 2016-07-26 MED ORDER — ONDANSETRON HCL 4 MG/2ML IJ SOLN
INTRAMUSCULAR | Status: DC | PRN
Start: 2016-07-26 — End: 2016-07-26
  Administered 2016-07-26: 4 mg via INTRAVENOUS

## 2016-07-26 MED ORDER — FENTANYL CITRATE (PF) 100 MCG/2ML IJ SOLN
50.0000 ug | INTRAMUSCULAR | Status: DC | PRN
  Administered 2016-07-26 (×2): 50 ug via INTRAVENOUS

## 2016-07-26 MED ORDER — SCOPOLAMINE 1 MG/3DAYS TD PT72: 1.0000 | MEDICATED_PATCH | Freq: Once | TRANSDERMAL | Status: DC | PRN

## 2016-07-26 MED ORDER — FENTANYL CITRATE (PF) 100 MCG/2ML IJ SOLN
INTRAMUSCULAR | Status: AC
  Filled 2016-07-26: qty 2

## 2016-07-26 MED ORDER — MEPERIDINE HCL 25 MG/ML IJ SOLN: 6.2500 mg | INTRAMUSCULAR | Status: DC | PRN

## 2016-07-26 MED ORDER — ONDANSETRON HCL 4 MG/2ML IJ SOLN: 4.0000 mg | Freq: Once | INTRAMUSCULAR | Status: DC | PRN

## 2016-07-26 MED ORDER — LACTATED RINGERS IV SOLN
INTRAVENOUS | Status: DC
  Administered 2016-07-26: 10:00:00 via INTRAVENOUS

## 2016-07-26 MED ORDER — BUPIVACAINE HCL (PF) 0.25 % IJ SOLN
INTRAMUSCULAR | Status: DC | PRN
  Administered 2016-07-26: 1 mL

## 2016-07-26 MED ORDER — CEFAZOLIN SODIUM-DEXTROSE 2-4 GM/100ML-% IV SOLN
INTRAVENOUS | Status: AC
  Filled 2016-07-26: qty 100

## 2016-07-26 MED ORDER — DEXAMETHASONE SODIUM PHOSPHATE 4 MG/ML IJ SOLN
INTRAMUSCULAR | Status: DC | PRN
  Administered 2016-07-26: 8 mg via INTRAVENOUS

## 2016-07-26 MED ORDER — HYDROCODONE-ACETAMINOPHEN 5-325 MG PO TABS: 1.0000 | ORAL_TABLET | Freq: Four times a day (QID) | ORAL | 0 refills | Status: DC | PRN

## 2016-07-26 MED ORDER — LIDOCAINE HCL (CARDIAC) 20 MG/ML IV SOLN
INTRAVENOUS | Status: DC | PRN
  Administered 2016-07-26: 50 mg via INTRAVENOUS

## 2016-07-26 SURGICAL SUPPLY — 73 items
BENZOIN TINCTURE PRP APPL 2/3 (GAUZE/BANDAGES/DRESSINGS) IMPLANT
BLADE CLIPPER SURG (BLADE) IMPLANT
BLADE SURG 15 STRL LF DISP TIS (BLADE) ×2 IMPLANT
BLADE SURG 15 STRL SS (BLADE) ×2
BNDG CONFORM 2 STRL LF (GAUZE/BANDAGES/DRESSINGS) IMPLANT
BNDG ELASTIC 2X5.8 VLCR STR LF (GAUZE/BANDAGES/DRESSINGS) IMPLANT
CANISTER SUCT 1200ML W/VALVE (MISCELLANEOUS) IMPLANT
CHLORAPREP W/TINT 26ML (MISCELLANEOUS) IMPLANT
CLEANER CAUTERY TIP 5X5 PAD (MISCELLANEOUS) IMPLANT
CLOSURE WOUND 1/2 X4 (GAUZE/BANDAGES/DRESSINGS)
CORDS BIPOLAR (ELECTRODE) IMPLANT
COVER BACK TABLE 60X90IN (DRAPES) ×4 IMPLANT
COVER MAYO STAND STRL (DRAPES) ×4 IMPLANT
DECANTER SPIKE VIAL GLASS SM (MISCELLANEOUS) IMPLANT
DERMABOND ADVANCED (GAUZE/BANDAGES/DRESSINGS)
DERMABOND ADVANCED .7 DNX12 (GAUZE/BANDAGES/DRESSINGS) IMPLANT
DRAPE LAPAROTOMY 100X72 PEDS (DRAPES) IMPLANT
DRAPE U-SHAPE 76X120 STRL (DRAPES) ×4 IMPLANT
DRESSING DUODERM 4X4 STERILE (GAUZE/BANDAGES/DRESSINGS) ×8 IMPLANT
DRSG EMULSION OIL 3X3 NADH (GAUZE/BANDAGES/DRESSINGS) ×4 IMPLANT
DRSG TEGADERM 2-3/8X2-3/4 SM (GAUZE/BANDAGES/DRESSINGS) IMPLANT
DRSG TEGADERM 4X4.75 (GAUZE/BANDAGES/DRESSINGS) IMPLANT
ELECT COATED BLADE 2.86 ST (ELECTRODE) IMPLANT
ELECT NEEDLE BLADE 2-5/6 (NEEDLE) ×4 IMPLANT
ELECT REM PT RETURN 9FT ADLT (ELECTROSURGICAL) ×4
ELECT REM PT RETURN 9FT PED (ELECTROSURGICAL)
ELECTRODE REM PT RETRN 9FT PED (ELECTROSURGICAL) IMPLANT
ELECTRODE REM PT RTRN 9FT ADLT (ELECTROSURGICAL) ×2 IMPLANT
GLOVE BIO SURGEON STRL SZ 6.5 (GLOVE) ×12 IMPLANT
GLOVE BIO SURGEONS STRL SZ 6.5 (GLOVE) ×4
GLOVE BIOGEL PI IND STRL 7.0 (GLOVE) ×4 IMPLANT
GLOVE BIOGEL PI INDICATOR 7.0 (GLOVE) ×4
GOWN STRL REUS W/ TWL LRG LVL3 (GOWN DISPOSABLE) ×4 IMPLANT
GOWN STRL REUS W/TWL LRG LVL3 (GOWN DISPOSABLE) ×4
MATRIX SURGICAL PSM 7X10CM (Tissue) ×4 IMPLANT
MICROMATRIX 1000MG (Tissue) ×4 IMPLANT
NEEDLE HYPO 30GX1 BEV (NEEDLE) IMPLANT
NEEDLE PRECISIONGLIDE 27X1.5 (NEEDLE) ×4 IMPLANT
NS IRRIG 1000ML POUR BTL (IV SOLUTION) ×4 IMPLANT
PACK BASIN DAY SURGERY FS (CUSTOM PROCEDURE TRAY) ×4 IMPLANT
PAD CLEANER CAUTERY TIP 5X5 (MISCELLANEOUS)
PENCIL BUTTON HOLSTER BLD 10FT (ELECTRODE) ×4 IMPLANT
RUBBERBAND STERILE (MISCELLANEOUS) IMPLANT
SHEET MEDIUM DRAPE 40X70 STRL (DRAPES) IMPLANT
SLEEVE SCD COMPRESS KNEE MED (MISCELLANEOUS) IMPLANT
SOLUTION PARTIC MCRMTRX 1000MG (Tissue) ×2 IMPLANT
SPONGE GAUZE 2X2 8PLY STER LF (GAUZE/BANDAGES/DRESSINGS)
SPONGE GAUZE 2X2 8PLY STRL LF (GAUZE/BANDAGES/DRESSINGS) IMPLANT
SPONGE GAUZE 4X4 12PLY STER LF (GAUZE/BANDAGES/DRESSINGS) ×4 IMPLANT
STRIP CLOSURE SKIN 1/2X4 (GAUZE/BANDAGES/DRESSINGS) IMPLANT
SUCTION FRAZIER HANDLE 10FR (MISCELLANEOUS) ×2
SUCTION TUBE FRAZIER 10FR DISP (MISCELLANEOUS) ×2 IMPLANT
SURGILUBE 2OZ TUBE FLIPTOP (MISCELLANEOUS) ×4 IMPLANT
SUT MNCRL 6-0 UNDY P1 1X18 (SUTURE) IMPLANT
SUT MNCRL AB 3-0 PS2 18 (SUTURE) IMPLANT
SUT MNCRL AB 4-0 PS2 18 (SUTURE) IMPLANT
SUT MON AB 5-0 P3 18 (SUTURE) IMPLANT
SUT MON AB 5-0 PS2 18 (SUTURE) IMPLANT
SUT MONOCRYL 6-0 P1 1X18 (SUTURE)
SUT PROLENE 5 0 P 3 (SUTURE) IMPLANT
SUT PROLENE 5 0 PS 2 (SUTURE) IMPLANT
SUT PROLENE 6 0 P 1 18 (SUTURE) IMPLANT
SUT SILK 4 0 PS 2 (SUTURE) ×4 IMPLANT
SUT VIC AB 5-0 P-3 18X BRD (SUTURE) IMPLANT
SUT VIC AB 5-0 P3 18 (SUTURE)
SUT VIC AB 5-0 PS2 18 (SUTURE) ×8 IMPLANT
SUT VICRYL 4-0 PS2 18IN ABS (SUTURE) IMPLANT
SYR BULB 3OZ (MISCELLANEOUS) ×4 IMPLANT
SYR CONTROL 10ML LL (SYRINGE) ×4 IMPLANT
TOWEL OR 17X24 6PK STRL BLUE (TOWEL DISPOSABLE) ×4 IMPLANT
TRAY DSU PREP LF (CUSTOM PROCEDURE TRAY) ×4 IMPLANT
TUBE CONNECTING 20'X1/4 (TUBING) ×1
TUBE CONNECTING 20X1/4 (TUBING) ×3 IMPLANT

## 2016-07-26 NOTE — Op Note (Addendum)
DATE OF OPERATION: 07/26/2016  LOCATION: [facility]  PREOPERATIVE DIAGNOSIS: melanoma in-situ scalp  POSTOPERATIVE DIAGNOSIS: Same  PROCEDURE: Re-excision of melanoma in-situ scalp 1 x 4 cm, placement of Acell (sheet 7 x 10 cm and 1000 gm powder).  SURGEON: Sharryn Belding Sanger Markel Kurtenbach, DO  ASSISTANT: Shawn Rayburn, PA  EBL: minimal  CONDITION: Stable  COMPLICATIONS: None  INDICATION: The patient, Randall Mcgee, is a 76 y.o. male born on [birth year], is here for treatment of melanoma with positive margins.   PROCEDURE DETAILS:  The patient was seen prior to surgery and marked.  The IV antibiotics were given. The patient was taken to the operating room and given a general anesthetic. A standard time out was performed and all information was confirmed by those in the room. SCDs were placed.   The head was prepared with betadine.  Local was injected at the site of the excision.  The anterior left scalp was marked.  Long stitch placed left and short right at the location of the side abutting the previous excision.  The #10 blade and bovie were used to excise the 1 x 4 cm area of skin where the biopsy was found to be positive on the precious excision.  The bovie was used for hemostasis.  The Acell sheet was placed 5 x 7 cm and 1000 gm of powder over the now 5 x 7 cm wound.  The sheet was secured with 5-0 Vicryl.  Adaptic was applied with KY gel.  The patient was allowed to wake up and taken to recovery room in stable condition at the end of the case. The family was notified at the end of the case.

## 2016-07-26 NOTE — Interval H&P Note (Signed)
History and Physical Interval Note:  07/26/2016 9:34 AM  Randall Mcgee  has presented today for surgery, with the diagnosis of SCALP MELANOMA  The various methods of treatment have been discussed with the patient and family. After consideration of risks, benefits and other options for treatment, the patient has consented to  Procedure(s): RE-EXCISION OF SCALP [place] (N/A) as a surgical intervention .  The patient's history has been reviewed, patient examined, no change in status, stable for surgery.  I have reviewed the patient's chart and labs.  Questions were answered to the patient's satisfaction.     Wallace Going

## 2016-07-26 NOTE — Anesthesia Preprocedure Evaluation (Signed)
Anesthesia Evaluation  Patient identified by MRN, date of birth, ID band Patient awake    Reviewed: Allergy & Precautions, NPO status , Patient's Chart, lab work & pertinent test results  Airway Mallampati: I  TM Distance: >3 FB Neck ROM: Full    Dental   Pulmonary    Pulmonary exam normal        Cardiovascular hypertension, Pt. on medications Normal cardiovascular exam     Neuro/Psych    GI/Hepatic   Endo/Other    Renal/GU      Musculoskeletal   Abdominal   Peds  Hematology   Anesthesia Other Findings   Reproductive/Obstetrics                             Anesthesia Physical Anesthesia Plan  ASA: II  Anesthesia Plan: General   Post-op Pain Management:    Induction: Intravenous  Airway Management Planned: LMA  Additional Equipment:   Intra-op Plan:   Post-operative Plan: Extubation in OR  Informed Consent: I have reviewed the patients History and Physical, chart, labs and discussed the procedure including the risks, benefits and alternatives for the proposed anesthesia with the patient or authorized representative who has indicated his/her understanding and acceptance.     Plan Discussed with: CRNA and Surgeon  Anesthesia Plan Comments:         Anesthesia Quick Evaluation  

## 2016-07-26 NOTE — Transfer of Care (Signed)
Immediate Anesthesia Transfer of Care Note  Patient: Randall Mcgee  Procedure(s) Performed: Procedure(s): RE-EXCISION OF SCALP MELANOMA FOR POSITIVE MARGAIN (N/A) APPLICATION OF ACELL OF HEAD (N/A)  Patient Location: PACU  Anesthesia Type:General  Level of Consciousness: awake, alert  and oriented  Airway & Oxygen Therapy: Patient Spontanous Breathing and Patient connected to face mask oxygen  Post-op Assessment: Report given to RN and Post -op Vital signs reviewed and stable  Post vital signs: Reviewed and stable  Last Vitals:  Vitals:   07/26/16 0928  BP: (!) 159/85  Pulse: (!) 58  Resp: 20  Temp: 36.9 C    Last Pain:  Vitals:   07/26/16 0928  TempSrc: Oral         Complications: No apparent anesthesia complications

## 2016-07-26 NOTE — Discharge Instructions (Signed)
°  Post Anesthesia Home Care Instructions  Activity: Get plenty of rest for the remainder of the day. A responsible adult should stay with you for 24 hours following the procedure.  For the next 24 hours, DO NOT: -Drive a car -Paediatric nurse -Drink alcoholic beverages -Take any medication unless instructed by your physician -Make any legal decisions or sign important papers.  Meals: Start with liquid foods such as gelatin or soup. Progress to regular foods as tolerated. Avoid greasy, spicy, heavy foods. If nausea and/or vomiting occur, drink only clear liquids until the nausea and/or vomiting subsides. Call your physician if vomiting continues.  Special Instructions/Symptoms: Your throat may feel dry or sore from the anesthesia or the breathing tube placed in your throat during surgery. If this causes discomfort, gargle with warm salt water. The discomfort should disappear within 24 hours.  If you had a scopolamine patch placed behind your ear for the management of post- operative nausea and/or vomiting:  1. The medication in the patch is effective for 72 hours, after which it should be removed.  Wrap patch in a tissue and discard in the trash. Wash hands thoroughly with soap and water. 2. You may remove the patch earlier than 72 hours if you experience unpleasant side effects which may include dry mouth, dizziness or visual disturbances. 3. Avoid touching the patch. Wash your hands with soap and water after contact with the patch.   Call your surgeon if you experience:   1.  Fever over 101.0. 2.  Inability to urinate. 3.  Nausea and/or vomiting. 4.  Extreme swelling or bruising at the surgical site. 5.  Continued bleeding from the incision. 6.  Increased pain, redness or drainage from the incision. 7.  Problems related to your pain medication. 8.  Any problems and/or concerns        KY gel to the scalp daily. Do not get the scalp wet yet.

## 2016-07-26 NOTE — H&P (View-Only) (Signed)
Randall Mcgee is an 76 y.o. male.   Chief Complaint: melanoma HPI: The patient is a 76 y.o. yrs old wm here with his wife for follow up of melanoma in situ of his scalp. He underwent excision in the OR 06/28/16. History:   He was diagnosed with scalp melanoma 5 years ago and treated with Mohs excision.  He noted a new lesion at the scar recently.  A biopsy showed a malignant melanoma 1.14 mm in thickness the deep margin was negative with the peripheral marin positive.  He underwent a workup by Dr. .Grandville Silos in General surgery for lymph nodes which was negative.  The area was ~ 4 cm at the scalp in the site of the previous scar excision.   Pathology = positive margins laterally from 8 to 10 o'clock .   Past Medical History:  Diagnosis Date  . History of chicken pox   . Hyperlipidemia   . Hypertension     Past Surgical History:  Procedure Laterality Date  . APPLICATION OF A-CELL OF HEAD/NECK N/A 06/28/2016   Procedure: APPLICATION OF A-CELL OF HEAD/NECK;  Surgeon: Wallace Going, DO;  Location: [place];  Service: Plastics;  Laterality: N/A;  . CHOLECYSTECTOMY    . ENDARTERECTOMY  06/20/11  . ENDARTERECTOMY  06/20/2011   Procedure: ENDARTERECTOMY CAROTID;  Surgeon: Elam Dutch, MD;  Location: [facility] OR;  Service: Vascular;  Laterality: Right;  Right Carotid endarterectomy with dacron patch angioplasty   . MASS EXCISION N/A 06/28/2016   Procedure: EXCISION SCALP MELANOMA;  Surgeon: Wallace Going, DO;  Location: [place];  Service: Plastics;  Laterality: N/A;  . TONSILLECTOMY    . TONSILLECTOMY      Family History  Problem Relation Age of Onset  . Heart disease Mother    Social History:  reports that he has never smoked. He has never used smokeless tobacco. He reports that he does not drink alcohol or use drugs.  Allergies: No Known Allergies   (Not in a hospital admission)  No results found for this or any previous visit (from the past  48 hour(s)). No results found.  Review of Systems  Constitutional: Negative.   Eyes: Negative.   Respiratory: Negative.   Cardiovascular: Negative.   Gastrointestinal: Negative.   Genitourinary: Negative.   Musculoskeletal: Negative.   Skin: Negative.   Psychiatric/Behavioral: Negative.     There were no vitals taken for this visit. Physical Exam  Constitutional: He appears well-developed and well-nourished.  HENT:  Head: Normocephalic.  Eyes: Pupils are equal, round, and reactive to light.  Cardiovascular: Normal rate.   Respiratory: Effort normal.  GI: Soft.  Neurological: He is alert.  Skin: Skin is warm.  Psychiatric: He has a normal mood and affect. His behavior is normal. Thought content normal.     Assessment/Plan Plan for excision of scalp melanoma with placement of Acell.  Wallace Going, DO 07/12/2016, 12:27 PM

## 2016-07-26 NOTE — Anesthesia Procedure Notes (Signed)
Procedure Name: LMA Insertion Date/Time: 07/26/2016 10:06 AM Performed by: Melynda Ripple D Pre-anesthesia Checklist: Patient identified, Emergency Drugs available, Suction available and Patient being monitored Patient Re-evaluated:Patient Re-evaluated prior to inductionOxygen Delivery Method: Circle system utilized Preoxygenation: Pre-oxygenation with 100% oxygen Intubation Type: IV induction Ventilation: Mask ventilation without difficulty LMA: LMA inserted LMA Size: 4.0 Number of attempts: 1 Airway Equipment and Method: Bite block Placement Confirmation: positive ETCO2 Tube secured with: Tape Dental Injury: Teeth and Oropharynx as per pre-operative assessment

## 2016-07-26 NOTE — Anesthesia Postprocedure Evaluation (Signed)
Anesthesia Post Note  Patient: Randall Mcgee  Procedure(s) Performed: Procedure(s) (LRB): RE-EXCISION OF SCALP MELANOMA FOR POSITIVE MARGAIN (N/A) APPLICATION OF ACELL OF HEAD (N/A)  Patient location during evaluation: PACU Anesthesia Type: General Level of consciousness: awake and alert Pain management: pain level controlled Vital Signs Assessment: post-procedure vital signs reviewed and stable Respiratory status: spontaneous breathing, nonlabored ventilation, respiratory function stable and patient connected to nasal cannula oxygen Cardiovascular status: blood pressure returned to baseline and stable Postop Assessment: no signs of nausea or vomiting Anesthetic complications: no       Last Vitals:  Vitals:   07/26/16 1115 07/26/16 1142  BP: 133/78 136/75  Pulse: 69 66  Resp: 10 18  Temp:  36.5 C    Last Pain:  Vitals:   07/26/16 1142  TempSrc: Oral  PainSc: 0-No pain                 Evanny Ellerbe DAVID

## 2016-07-27 ENCOUNTER — Encounter (HOSPITAL_BASED_OUTPATIENT_CLINIC_OR_DEPARTMENT_OTHER): Payer: Self-pay | Admitting: Plastic Surgery

## 2016-08-04 ENCOUNTER — Other Ambulatory Visit: Payer: Self-pay | Admitting: Internal Medicine

## 2016-08-08 DIAGNOSIS — Z85820 Personal history of malignant melanoma of skin: Secondary | ICD-10-CM | POA: Diagnosis not present

## 2016-08-08 DIAGNOSIS — S0100XD Unspecified open wound of scalp, subsequent encounter: Secondary | ICD-10-CM | POA: Diagnosis not present

## 2016-08-14 DIAGNOSIS — S0100XD Unspecified open wound of scalp, subsequent encounter: Secondary | ICD-10-CM | POA: Diagnosis not present

## 2016-08-14 DIAGNOSIS — Z85820 Personal history of malignant melanoma of skin: Secondary | ICD-10-CM | POA: Diagnosis not present

## 2016-08-18 DIAGNOSIS — Z85820 Personal history of malignant melanoma of skin: Secondary | ICD-10-CM | POA: Diagnosis not present

## 2016-08-18 DIAGNOSIS — S0100XD Unspecified open wound of scalp, subsequent encounter: Secondary | ICD-10-CM | POA: Diagnosis not present

## 2016-08-20 ENCOUNTER — Other Ambulatory Visit: Payer: Self-pay | Admitting: Internal Medicine

## 2016-08-22 DIAGNOSIS — Z85820 Personal history of malignant melanoma of skin: Secondary | ICD-10-CM | POA: Diagnosis not present

## 2016-08-22 DIAGNOSIS — S0100XD Unspecified open wound of scalp, subsequent encounter: Secondary | ICD-10-CM | POA: Diagnosis not present

## 2016-08-28 ENCOUNTER — Encounter (HOSPITAL_BASED_OUTPATIENT_CLINIC_OR_DEPARTMENT_OTHER): Payer: Self-pay | Admitting: *Deleted

## 2016-08-29 ENCOUNTER — Ambulatory Visit: Payer: Self-pay | Admitting: Plastic Surgery

## 2016-08-29 DIAGNOSIS — C439 Malignant melanoma of skin, unspecified: Secondary | ICD-10-CM

## 2016-08-31 ENCOUNTER — Ambulatory Visit (HOSPITAL_BASED_OUTPATIENT_CLINIC_OR_DEPARTMENT_OTHER): Payer: Medicare Other | Admitting: Anesthesiology

## 2016-08-31 ENCOUNTER — Encounter (HOSPITAL_BASED_OUTPATIENT_CLINIC_OR_DEPARTMENT_OTHER): Payer: Self-pay | Admitting: *Deleted

## 2016-08-31 ENCOUNTER — Ambulatory Visit (HOSPITAL_BASED_OUTPATIENT_CLINIC_OR_DEPARTMENT_OTHER)
Admission: RE | Admit: 2016-08-31 | Discharge: 2016-08-31 | Disposition: A | Discharge status: Home / Self Care | Payer: Medicare Other | Source: Ambulatory Visit | Attending: Plastic Surgery | Admitting: Plastic Surgery

## 2016-08-31 ENCOUNTER — Encounter (HOSPITAL_BASED_OUTPATIENT_CLINIC_OR_DEPARTMENT_OTHER)
Admission: RE | Disposition: A | Discharge status: Home / Self Care | Payer: Self-pay | Source: Ambulatory Visit | Attending: Plastic Surgery

## 2016-08-31 DIAGNOSIS — Z85820 Personal history of malignant melanoma of skin: Secondary | ICD-10-CM | POA: Diagnosis not present

## 2016-08-31 DIAGNOSIS — E7800 Pure hypercholesterolemia, unspecified: Secondary | ICD-10-CM | POA: Diagnosis not present

## 2016-08-31 DIAGNOSIS — Z79899 Other long term (current) drug therapy: Secondary | ICD-10-CM | POA: Insufficient documentation

## 2016-08-31 DIAGNOSIS — Y838 Other surgical procedures as the cause of abnormal reaction of the patient, or of later complication, without mention of misadventure at the time of the procedure: Secondary | ICD-10-CM | POA: Insufficient documentation

## 2016-08-31 DIAGNOSIS — I10 Essential (primary) hypertension: Secondary | ICD-10-CM | POA: Diagnosis not present

## 2016-08-31 DIAGNOSIS — Z7982 Long term (current) use of aspirin: Secondary | ICD-10-CM | POA: Insufficient documentation

## 2016-08-31 DIAGNOSIS — E785 Hyperlipidemia, unspecified: Secondary | ICD-10-CM | POA: Diagnosis not present

## 2016-08-31 DIAGNOSIS — Z5211 Skin donor, autologous: Secondary | ICD-10-CM | POA: Diagnosis not present

## 2016-08-31 DIAGNOSIS — I6529 Occlusion and stenosis of unspecified carotid artery: Secondary | ICD-10-CM | POA: Diagnosis not present

## 2016-08-31 DIAGNOSIS — M952 Other acquired deformity of head: Secondary | ICD-10-CM | POA: Diagnosis not present

## 2016-08-31 DIAGNOSIS — S0100XA Unspecified open wound of scalp, initial encounter: Secondary | ICD-10-CM | POA: Diagnosis not present

## 2016-08-31 DIAGNOSIS — C439 Malignant melanoma of skin, unspecified: Secondary | ICD-10-CM

## 2016-08-31 DIAGNOSIS — T8189XA Other complications of procedures, not elsewhere classified, initial encounter: Secondary | ICD-10-CM | POA: Diagnosis not present

## 2016-08-31 HISTORY — PX: APPLICATION OF A-CELL OF HEAD/NECK: SHX6304

## 2016-08-31 HISTORY — PX: SKIN SPLIT GRAFT: SHX444

## 2016-08-31 SURGERY — APPLICATION, GRAFT, SKIN, SPLIT-THICKNESS
Anesthesia: General | Site: Leg Upper

## 2016-08-31 MED ORDER — BUPIVACAINE-EPINEPHRINE (PF) 0.5% -1:200000 IJ SOLN
INTRAMUSCULAR | Status: AC
  Filled 2016-08-31: qty 120

## 2016-08-31 MED ORDER — HYDROCODONE-ACETAMINOPHEN 5-325 MG PO TABS: 1.0000 | ORAL_TABLET | Freq: Four times a day (QID) | ORAL | 0 refills | Status: DC | PRN

## 2016-08-31 MED ORDER — CEFAZOLIN SODIUM-DEXTROSE 2-4 GM/100ML-% IV SOLN
INTRAVENOUS | Status: AC
  Filled 2016-08-31: qty 100

## 2016-08-31 MED ORDER — ATROPINE SULFATE 0.4 MG/ML IJ SOLN
INTRAMUSCULAR | Status: AC
  Filled 2016-08-31: qty 1

## 2016-08-31 MED ORDER — PROPOFOL 500 MG/50ML IV EMUL
INTRAVENOUS | Status: AC
  Filled 2016-08-31: qty 50

## 2016-08-31 MED ORDER — DEXAMETHASONE SODIUM PHOSPHATE 10 MG/ML IJ SOLN
INTRAMUSCULAR | Status: AC
  Filled 2016-08-31: qty 1

## 2016-08-31 MED ORDER — SUCCINYLCHOLINE CHLORIDE 200 MG/10ML IV SOSY
PREFILLED_SYRINGE | INTRAVENOUS | Status: AC
  Filled 2016-08-31: qty 10

## 2016-08-31 MED ORDER — EPHEDRINE SULFATE 50 MG/ML IJ SOLN
INTRAMUSCULAR | Status: DC | PRN
  Administered 2016-08-31: 15 mg via INTRAVENOUS

## 2016-08-31 MED ORDER — LACTATED RINGERS IV SOLN
INTRAVENOUS | Status: DC
  Administered 2016-08-31: 07:00:00 via INTRAVENOUS

## 2016-08-31 MED ORDER — EPHEDRINE 5 MG/ML INJ
INTRAVENOUS | Status: AC
  Filled 2016-08-31: qty 10

## 2016-08-31 MED ORDER — FENTANYL CITRATE (PF) 100 MCG/2ML IJ SOLN
25.0000 ug | INTRAMUSCULAR | Status: DC | PRN
  Administered 2016-08-31: 25 ug via INTRAVENOUS
  Administered 2016-08-31: 50 ug via INTRAVENOUS

## 2016-08-31 MED ORDER — MIDAZOLAM HCL 2 MG/2ML IJ SOLN
INTRAMUSCULAR | Status: AC
  Filled 2016-08-31: qty 2

## 2016-08-31 MED ORDER — FENTANYL CITRATE (PF) 100 MCG/2ML IJ SOLN
50.0000 ug | INTRAMUSCULAR | Status: DC | PRN
  Administered 2016-08-31: 50 ug via INTRAVENOUS

## 2016-08-31 MED ORDER — PROPOFOL 10 MG/ML IV BOLUS
INTRAVENOUS | Status: DC | PRN
Start: 2016-08-31 — End: 2016-08-31
  Administered 2016-08-31: 200 mg via INTRAVENOUS

## 2016-08-31 MED ORDER — CEFAZOLIN SODIUM-DEXTROSE 2-4 GM/100ML-% IV SOLN
2.0000 g | INTRAVENOUS | Status: AC
  Administered 2016-08-31: 2 g via INTRAVENOUS

## 2016-08-31 MED ORDER — FENTANYL CITRATE (PF) 100 MCG/2ML IJ SOLN
INTRAMUSCULAR | Status: AC
  Filled 2016-08-31: qty 2

## 2016-08-31 MED ORDER — MIDAZOLAM HCL 2 MG/2ML IJ SOLN: 1.0000 mg | INTRAMUSCULAR | Status: DC | PRN

## 2016-08-31 MED ORDER — ONDANSETRON HCL 4 MG/2ML IJ SOLN
INTRAMUSCULAR | Status: AC
  Filled 2016-08-31: qty 2

## 2016-08-31 MED ORDER — OXYCODONE HCL 5 MG/5ML PO SOLN: 5.0000 mg | Freq: Once | ORAL | Status: DC | PRN

## 2016-08-31 MED ORDER — BUPIVACAINE HCL (PF) 0.25 % IJ SOLN
INTRAMUSCULAR | Status: AC
  Filled 2016-08-31: qty 120

## 2016-08-31 MED ORDER — BUPIVACAINE-EPINEPHRINE 0.25% -1:200000 IJ SOLN: INTRAMUSCULAR | Status: DC | PRN

## 2016-08-31 MED ORDER — ONDANSETRON HCL 4 MG/2ML IJ SOLN: 4.0000 mg | Freq: Four times a day (QID) | INTRAMUSCULAR | Status: DC | PRN

## 2016-08-31 MED ORDER — LIDOCAINE 2% (20 MG/ML) 5 ML SYRINGE
INTRAMUSCULAR | Status: AC
  Filled 2016-08-31: qty 5

## 2016-08-31 MED ORDER — DEXAMETHASONE SODIUM PHOSPHATE 4 MG/ML IJ SOLN
INTRAMUSCULAR | Status: DC | PRN
  Administered 2016-08-31: 10 mg via INTRAVENOUS

## 2016-08-31 MED ORDER — SCOPOLAMINE 1 MG/3DAYS TD PT72: 1.0000 | MEDICATED_PATCH | Freq: Once | TRANSDERMAL | Status: DC | PRN

## 2016-08-31 MED ORDER — BUPIVACAINE-EPINEPHRINE 0.5% -1:200000 IJ SOLN
INTRAMUSCULAR | Status: DC | PRN
  Administered 2016-08-31: 10 mL

## 2016-08-31 MED ORDER — LIDOCAINE HCL (CARDIAC) 20 MG/ML IV SOLN
INTRAVENOUS | Status: DC | PRN
  Administered 2016-08-31: 60 mg via INTRAVENOUS

## 2016-08-31 MED ORDER — BACITRACIN-NEOMYCIN-POLYMYXIN 400-5-5000 EX OINT
TOPICAL_OINTMENT | CUTANEOUS | Status: AC
  Filled 2016-08-31: qty 1

## 2016-08-31 MED ORDER — PHENYLEPHRINE 40 MCG/ML (10ML) SYRINGE FOR IV PUSH (FOR BLOOD PRESSURE SUPPORT)
PREFILLED_SYRINGE | INTRAVENOUS | Status: AC
  Filled 2016-08-31: qty 10

## 2016-08-31 MED ORDER — OXYCODONE HCL 5 MG PO TABS: 5.0000 mg | ORAL_TABLET | Freq: Once | ORAL | Status: DC | PRN

## 2016-08-31 SURGICAL SUPPLY — 78 items
BAG DECANTER FOR FLEXI CONT (MISCELLANEOUS) IMPLANT
BANDAGE ACE 3X5.8 VEL STRL LF (GAUZE/BANDAGES/DRESSINGS) IMPLANT
BANDAGE ACE 4X5 VEL STRL LF (GAUZE/BANDAGES/DRESSINGS) ×4 IMPLANT
BANDAGE ACE 6X5 VEL STRL LF (GAUZE/BANDAGES/DRESSINGS) IMPLANT
BLADE CLIPPER SURG (BLADE) IMPLANT
BLADE DERMATOME SS (BLADE) ×4 IMPLANT
BLADE HEX COATED 2.75 (ELECTRODE) ×4 IMPLANT
BLADE SURG 10 STRL SS (BLADE) ×4 IMPLANT
BLADE SURG 15 STRL LF DISP TIS (BLADE) ×2 IMPLANT
BLADE SURG 15 STRL SS (BLADE) ×2
BNDG GAUZE ELAST 4 BULKY (GAUZE/BANDAGES/DRESSINGS) ×4 IMPLANT
BRIEF STRETCH FOR OB PAD LRG (UNDERPADS AND DIAPERS) IMPLANT
CANISTER SUCT 1200ML W/VALVE (MISCELLANEOUS) IMPLANT
CHLORAPREP W/TINT 26ML (MISCELLANEOUS) IMPLANT
COTTONBALL LRG STERILE PKG (GAUZE/BANDAGES/DRESSINGS) ×8 IMPLANT
COVER BACK TABLE 60X90IN (DRAPES) ×4 IMPLANT
COVER MAYO STAND STRL (DRAPES) ×4 IMPLANT
DECANTER SPIKE VIAL GLASS SM (MISCELLANEOUS) IMPLANT
DERMABOND ADVANCED (GAUZE/BANDAGES/DRESSINGS)
DERMABOND ADVANCED .7 DNX12 (GAUZE/BANDAGES/DRESSINGS) IMPLANT
DERMACARRIERS GRAFT 1 TO 1.5 (DISPOSABLE)
DRAPE INCISE IOBAN 66X45 STRL (DRAPES) IMPLANT
DRAPE LAPAROTOMY 100X72 PEDS (DRAPES) ×4 IMPLANT
DRAPE U-SHAPE 76X120 STRL (DRAPES) ×4 IMPLANT
DRESSING DUODERM 4X4 STERILE (GAUZE/BANDAGES/DRESSINGS) IMPLANT
DRSG ADAPTIC 3X8 NADH LF (GAUZE/BANDAGES/DRESSINGS) IMPLANT
DRSG EMULSION OIL 3X3 NADH (GAUZE/BANDAGES/DRESSINGS) ×4 IMPLANT
DRSG OPSITE 6X11 MED (GAUZE/BANDAGES/DRESSINGS) IMPLANT
DRSG PAD ABDOMINAL 8X10 ST (GAUZE/BANDAGES/DRESSINGS) ×8 IMPLANT
DRSG TEGADERM 4X10 (GAUZE/BANDAGES/DRESSINGS) IMPLANT
ELECT REM PT RETURN 9FT ADLT (ELECTROSURGICAL) ×4
ELECTRODE REM PT RTRN 9FT ADLT (ELECTROSURGICAL) ×2 IMPLANT
GAUZE SPONGE 4X4 12PLY STRL (GAUZE/BANDAGES/DRESSINGS) ×4 IMPLANT
GAUZE XEROFORM 5X9 LF (GAUZE/BANDAGES/DRESSINGS) ×4 IMPLANT
GLOVE BIO SURGEON STRL SZ 6.5 (GLOVE) ×6 IMPLANT
GLOVE BIO SURGEON STRL SZ7 (GLOVE) ×4 IMPLANT
GLOVE BIO SURGEONS STRL SZ 6.5 (GLOVE) ×2
GOWN STRL REUS W/ TWL LRG LVL3 (GOWN DISPOSABLE) ×6 IMPLANT
GOWN STRL REUS W/TWL LRG LVL3 (GOWN DISPOSABLE) ×6
GRAFT DERMACARRIERS 1 TO 1.5 (DISPOSABLE) IMPLANT
MATRIX SURGICAL PSM 5X5CM (Tissue) ×4 IMPLANT
NEEDLE HYPO 25X1 1.5 SAFETY (NEEDLE) ×4 IMPLANT
NEEDLE PRECISIONGLIDE 27X1.5 (NEEDLE) IMPLANT
NS IRRIG 1000ML POUR BTL (IV SOLUTION) ×8 IMPLANT
PACK BASIN DAY SURGERY FS (CUSTOM PROCEDURE TRAY) ×4 IMPLANT
PADDING CAST ABS 3INX4YD NS (CAST SUPPLIES)
PADDING CAST ABS 4INX4YD NS (CAST SUPPLIES)
PADDING CAST ABS COTTON 3X4 (CAST SUPPLIES) IMPLANT
PADDING CAST ABS COTTON 4X4 ST (CAST SUPPLIES) IMPLANT
PENCIL BUTTON HOLSTER BLD 10FT (ELECTRODE) ×4 IMPLANT
SHEET MEDIUM DRAPE 40X70 STRL (DRAPES) IMPLANT
SLEEVE SCD COMPRESS KNEE MED (MISCELLANEOUS) ×4 IMPLANT
SPLINT FIBERGLASS 3X35 (CAST SUPPLIES) IMPLANT
SPLINT FIBERGLASS 4X30 (CAST SUPPLIES) IMPLANT
SPONGE GAUZE 4X4 12PLY STER LF (GAUZE/BANDAGES/DRESSINGS) ×4 IMPLANT
SPONGE LAP 18X18 X RAY DECT (DISPOSABLE) IMPLANT
STAPLER VISISTAT 35W (STAPLE) IMPLANT
SUCTION FRAZIER HANDLE 10FR (MISCELLANEOUS)
SUCTION TUBE FRAZIER 10FR DISP (MISCELLANEOUS) IMPLANT
SURGILUBE 2OZ TUBE FLIPTOP (MISCELLANEOUS) ×4 IMPLANT
SUT MNCRL AB 4-0 PS2 18 (SUTURE) ×4 IMPLANT
SUT MON AB 5-0 PS2 18 (SUTURE) IMPLANT
SUT SILK 3 0 SH CR/8 (SUTURE) IMPLANT
SUT SILK 4 0 PS 2 (SUTURE) ×8 IMPLANT
SUT VIC AB 3-0 FS2 27 (SUTURE) IMPLANT
SUT VIC AB 4-0 RB1 18 (SUTURE) IMPLANT
SUT VIC AB 5-0 P-3 18X BRD (SUTURE) ×2 IMPLANT
SUT VIC AB 5-0 P3 18 (SUTURE) ×2
SUT VIC AB 5-0 PS2 18 (SUTURE) ×4 IMPLANT
SUT VICRYL 4-0 PS2 18IN ABS (SUTURE) IMPLANT
SYR BULB IRRIGATION 50ML (SYRINGE) ×4 IMPLANT
SYR CONTROL 10ML LL (SYRINGE) ×4 IMPLANT
TOWEL OR 17X24 6PK STRL BLUE (TOWEL DISPOSABLE) ×8 IMPLANT
TRAY DSU PREP LF (CUSTOM PROCEDURE TRAY) ×4 IMPLANT
TUBE CONNECTING 20'X1/4 (TUBING) ×1
TUBE CONNECTING 20X1/4 (TUBING) ×3 IMPLANT
UNDERPAD 30X30 (UNDERPADS AND DIAPERS) ×8 IMPLANT
YANKAUER SUCT BULB TIP NO VENT (SUCTIONS) ×4 IMPLANT

## 2016-08-31 NOTE — Anesthesia Procedure Notes (Signed)
Procedure Name: LMA Insertion Date/Time: 08/31/2016 7:30 AM Performed by: Melynda Ripple D Pre-anesthesia Checklist: Patient identified, Emergency Drugs available, Suction available and Patient being monitored Patient Re-evaluated:Patient Re-evaluated prior to inductionOxygen Delivery Method: Circle system utilized Preoxygenation: Pre-oxygenation with 100% oxygen Intubation Type: IV induction Ventilation: Mask ventilation without difficulty LMA: LMA inserted LMA Size: 4.0 Number of attempts: 1 Airway Equipment and Method: Bite block Placement Confirmation: positive ETCO2 Tube secured with: Tape Dental Injury: Teeth and Oropharynx as per pre-operative assessment

## 2016-08-31 NOTE — Anesthesia Preprocedure Evaluation (Signed)
Anesthesia Evaluation  Patient identified by MRN, date of birth, ID band Patient awake    Reviewed: Allergy & Precautions, H&P , NPO status , Patient's Chart, lab work & pertinent test results  Airway Mallampati: II   Neck ROM: full    Dental   Pulmonary neg pulmonary ROS,    breath sounds clear to auscultation       Cardiovascular hypertension, + Peripheral Vascular Disease   Rhythm:regular Rate:Normal     Neuro/Psych    GI/Hepatic   Endo/Other    Renal/GU      Musculoskeletal   Abdominal   Peds  Hematology   Anesthesia Other Findings   Reproductive/Obstetrics                             Anesthesia Physical Anesthesia Plan  ASA: II  Anesthesia Plan: General   Post-op Pain Management:    Induction: Intravenous  Airway Management Planned: Oral ETT  Additional Equipment:   Intra-op Plan:   Post-operative Plan: Extubation in OR  Informed Consent: I have reviewed the patients History and Physical, chart, labs and discussed the procedure including the risks, benefits and alternatives for the proposed anesthesia with the patient or authorized representative who has indicated his/her understanding and acceptance.     Plan Discussed with: CRNA, Anesthesiologist and Surgeon  Anesthesia Plan Comments:         Anesthesia Quick Evaluation

## 2016-08-31 NOTE — Discharge Instructions (Addendum)
ky gel to the left thigh daily.     Post Anesthesia Home Care Instructions  Activity: Get plenty of rest for the remainder of the day. A responsible adult should stay with you for 24 hours following the procedure.  For the next 24 hours, DO NOT: -Drive a car -Paediatric nurse -Drink alcoholic beverages -Take any medication unless instructed by your physician -Make any legal decisions or sign important papers.  Meals: Start with liquid foods such as gelatin or soup. Progress to regular foods as tolerated. Avoid greasy, spicy, heavy foods. If nausea and/or vomiting occur, drink only clear liquids until the nausea and/or vomiting subsides. Call your physician if vomiting continues.  Special Instructions/Symptoms: Your throat may feel dry or sore from the anesthesia or the breathing tube placed in your throat during surgery. If this causes discomfort, gargle with warm salt water. The discomfort should disappear within 24 hours.  If you had a scopolamine patch placed behind your ear for the management of post- operative nausea and/or vomiting:  1. The medication in the patch is effective for 72 hours, after which it should be removed.  Wrap patch in a tissue and discard in the trash. Wash hands thoroughly with soap and water. 2. You may remove the patch earlier than 72 hours if you experience unpleasant side effects which may include dry mouth, dizziness or visual disturbances. 3. Avoid touching the patch. Wash your hands with soap and water after contact with the patch.

## 2016-08-31 NOTE — Op Note (Signed)
DATE OF OPERATION: 08/31/2016  LOCATION: [facility]  PREOPERATIVE DIAGNOSIS: Melanoma of scalp with wound  POSTOPERATIVE DIAGNOSIS: Same  PROCEDURE: Split thickness skin graft to scalp 4 x 6 cm and placement of Acell to donor site 5 x 5 cm sheet  SURGEON: Claire Sanger Dillingham, DO  ASSISTANT: Shawn Rayburn, PA  EBL: 10 cc  CONDITION: Stable  COMPLICATIONS: None  INDICATION: The patient, Randall Mcgee, is a 76 y.o. male born on July 06, 1941, is here for treatment of a scalp melanoma that was excised.  He had Acell placed to begin the healing process.  He now presents for skin graft placement.   PROCEDURE DETAILS:  The patient was seen prior to surgery and marked.  The IV antibiotics were given. The patient was taken to the operating room and given a general anesthetic. A standard time out was performed and all information was confirmed by those in the room. SCDs were placed.   The scalp and left thigh were preped and draped.  The local was injected into the left thigh dermis for intraoperative hemostasis and post operative pain control.   The dermatome was set at 06/999 inch and a graft obtained from the thigh.  The 5 x 5 cm Acell was applied to the donor site and secured with 5-0 Vicryl.  Adaptic was placed over the Acell with KY gel and gauze.  The 4 x 6 cm graft was placed on the scalp and secured with 4-0 Vicryl ties and xeroform bolster.  Slits were placed in the graft so there would not be an accumulation of fluid. The patient was allowed to wake up and taken to recovery room in stable condition at the end of the case. The family was notified at the end of the case.

## 2016-08-31 NOTE — Transfer of Care (Signed)
Immediate Anesthesia Transfer of Care Note  Patient: Randall Mcgee  Procedure(s) Performed: Procedure(s): SKIN GRAFT SPLIT THICKNESS TO HIS SCALP FROM HIS THIGH (Left) APPLICATION OF A-CELL OF HEAD/NECK (N/A)  Patient Location: PACU  Anesthesia Type:General  Level of Consciousness: sedated  Airway & Oxygen Therapy: Patient Spontanous Breathing and Patient connected to face mask oxygen  Post-op Assessment: Report given to RN and Post -op Vital signs reviewed and stable  Post vital signs: Reviewed and stable  Last Vitals:  Vitals:   08/31/16 0629 08/31/16 0815  BP: (!) 156/72 127/72  Pulse: (!) 55 72  Resp: 18 17  Temp: 36.7 C     Last Pain:  Vitals:   08/31/16 0629  TempSrc: Oral      Patients Stated Pain Goal: 0 ([id number])  Complications: No apparent anesthesia complications

## 2016-08-31 NOTE — Interval H&P Note (Signed)
History and Physical Interval Note:  08/31/2016 7:09 AM  Randall Mcgee  has presented today for surgery, with the diagnosis of SCALP WOUND  The various methods of treatment have been discussed with the patient and family. After consideration of risks, benefits and other options for treatment, the patient has consented to  Procedure(s): SKIN GRAFT SPLIT THICKNESS TO HIS SCALP FROM HIS THIGH (N/A) APPLICATION OF A-CELL OF HEAD/NECK (N/A) as a surgical intervention .  The patient's history has been reviewed, patient examined, no change in status, stable for surgery.  I have reviewed the patient's chart and labs.  Questions were answered to the patient's satisfaction.     Wallace Going

## 2016-08-31 NOTE — Anesthesia Postprocedure Evaluation (Signed)
Anesthesia Post Note  Patient: Randall Mcgee  Procedure(s) Performed: Procedure(s) (LRB): SKIN GRAFT SPLIT THICKNESS TO HIS SCALP FROM HIS THIGH (Left) APPLICATION OF A-CELL OF HEAD/NECK (N/A)  Patient location during evaluation: PACU Anesthesia Type: General Level of consciousness: awake and alert and patient cooperative Pain management: pain level controlled Vital Signs Assessment: post-procedure vital signs reviewed and stable Respiratory status: spontaneous breathing and respiratory function stable Cardiovascular status: stable Anesthetic complications: no       Last Vitals:  Vitals:   08/31/16 0900 08/31/16 0919  BP: 128/84 (!) 143/75  Pulse: 69 70  Resp: 16 16  Temp:  36.6 C    Last Pain:  Vitals:   08/31/16 0900  TempSrc:   PainSc: [place]

## 2016-08-31 NOTE — H&P (Signed)
Randall Mcgee is an 76 y.o. male.   Chief Complaint: wound from melanoma excisioni HPI: The patient is a 76 yrs old wm here with family for treatment of a scalp wound.  He had melanoma that was excised several times due to positive margins.  The area was covered with A[place] and did very well to fill in.  He now present for coverage with a skin graft.  The area is clean and granulated.  Past Medical History:  Diagnosis Date  . Cancer ([place]) 2017   melanoma on scalp  . History of chicken pox   . Hyperlipidemia   . Hypertension     Past Surgical History:  Procedure Laterality Date  . APPLICATION OF A-CELL OF EXTREMITY N/A 07/13/2016   Procedure: APPLICATION OF A-CELL;  Surgeon: Wallace Going, DO;  Location: [place];  Service: Plastics;  Laterality: N/A;  . APPLICATION OF A-CELL OF HEAD/NECK N/A 06/28/2016   Procedure: APPLICATION OF A-CELL OF HEAD/NECK;  Surgeon: Wallace Going, DO;  Location: [facility];  Service: Plastics;  Laterality: N/A;  . APPLICATION OF A-CELL OF HEAD/NECK N/A 07/26/2016   Procedure: APPLICATION OF ACELL OF HEAD;  Surgeon: Wallace Going, DO;  Location: [facility];  Service: Plastics;  Laterality: N/A;  . CHOLECYSTECTOMY    . ENDARTERECTOMY  06/20/11  . ENDARTERECTOMY  06/20/2011   Procedure: ENDARTERECTOMY CAROTID;  Surgeon: Elam Dutch, MD;  Location: [facility] OR;  Service: Vascular;  Laterality: Right;  Right Carotid endarterectomy with dacron patch angioplasty   . MASS EXCISION N/A 06/28/2016   Procedure: EXCISION SCALP MELANOMA;  Surgeon: Wallace Going, DO;  Location: [place];  Service: Plastics;  Laterality: N/A;  . MASS EXCISION N/A 07/13/2016   Procedure: RE- EXCISION OF SCALP MELANOMA;  Surgeon: Wallace Going, DO;  Location: [place];  Service: Plastics;  Laterality: N/A;  . MASS EXCISION N/A 07/26/2016   Procedure: RE-EXCISION OF SCALP MELANOMA FOR  POSITIVE MARGAIN;  Surgeon: Wallace Going, DO;  Location: [place];  Service: Plastics;  Laterality: N/A;  . TONSILLECTOMY    . TONSILLECTOMY      Family History  Problem Relation Age of Onset  . Heart disease Mother    Social History:  reports that he has never smoked. He has never used smokeless tobacco. He reports that he does not drink alcohol or use drugs.  Allergies: No Known Allergies  Medications Prior to Admission  Medication Sig Dispense Refill  . ALPRAZolam (XANAX) 0.5 MG tablet TAKE 1 TABLET BY MOUTH EVERY NIGHT AT BEDTIME AS NEEDED FOR SLEEP 60 tablet 0  . aspirin 81 MG tablet Take 81 mg by mouth daily.    Marland Kitchen GINSENG PO Take 450 mg by mouth daily.     Marland Kitchen lisinopril (PRINIVIL,ZESTRIL) 20 MG tablet TAKE 1 TABLET BY MOUTH DAILY 90 tablet 0  . Multiple Vitamins-Minerals (CENTRUM SILVER PO) Take 1 tablet by mouth daily.     . Omega-3 Fatty Acids (FISH OIL) 1200 MG CAPS Take 1,200 mg by mouth daily.     . propranolol (INDERAL) 20 MG tablet TAKE 1 TABLET BY MOUTH TWICE DAILY. THIS REPLACES NADOLOL. 180 tablet 3  . simvastatin (ZOCOR) 40 MG tablet TAKE 1 TABLET BY MOUTH EVERY EVENING 90 tablet 0  . [place] Johns Wort 300 MG CAPS Take 1 capsule by mouth daily.     Marland Kitchen HYDROcodone-acetaminophen (NORCO) 5-325 MG tablet Take 1 tablet  by mouth every 6 (six) hours as needed for moderate pain. 15 tablet 0  . indomethacin (INDOCIN) 25 MG capsule TAKE 1 CAPSULE(25 MG) BY MOUTH THREE TIMES DAILY AS NEEDED 270 capsule 4  . sildenafil (REVATIO) 20 MG tablet TAKE 2 TABLETS BY MOUTH DAILY AS DIRECTED 180 tablet 3    No results found for this or any previous visit (from the past 48 hour(s)). No results found.  Review of Systems  Constitutional: Negative.   HENT: Negative.   Eyes: Negative.   Respiratory: Negative.   Cardiovascular: Negative.   Gastrointestinal: Negative.   Genitourinary: Negative.   Musculoskeletal: Negative.   Skin: Negative.   Neurological: Negative.    Psychiatric/Behavioral: Negative.     Blood pressure (!) 156/72, pulse (!) 55, temperature 98.1 F (36.7 C), temperature source Oral, resp. rate 18, height 5\' 8"  (1.727 m), weight 93.9 kg (207 lb), SpO2 99 %. Physical Exam  Constitutional: He is oriented to person, place, and time. He appears well-developed and well-nourished.  HENT:  Head: Normocephalic.  Eyes: EOM are normal. Pupils are equal, round, and reactive to light.  Cardiovascular: Normal rate.   Respiratory: Effort normal. No respiratory distress.  GI: Soft. He exhibits no distension.  Musculoskeletal: He exhibits no edema.  Neurological: He is alert and oriented to person, place, and time.  Skin: Skin is warm. No rash noted.  Psychiatric: He has a normal mood and affect. His behavior is normal. Judgment and thought content normal.     Assessment/Plan Plan split thickness skin graft to the scalp for treatment of wound after melanoma excision.  Wallace Going, DO 08/31/2016, 7:06 AM

## 2016-09-01 ENCOUNTER — Encounter (HOSPITAL_BASED_OUTPATIENT_CLINIC_OR_DEPARTMENT_OTHER): Payer: Self-pay | Admitting: Plastic Surgery

## 2016-09-08 DIAGNOSIS — Z945 Skin transplant status: Secondary | ICD-10-CM | POA: Insufficient documentation

## 2016-09-09 DIAGNOSIS — Z48817 Encounter for surgical aftercare following surgery on the skin and subcutaneous tissue: Secondary | ICD-10-CM | POA: Diagnosis not present

## 2016-09-09 DIAGNOSIS — C434 Malignant melanoma of scalp and neck: Secondary | ICD-10-CM | POA: Diagnosis not present

## 2016-09-09 DIAGNOSIS — Z7982 Long term (current) use of aspirin: Secondary | ICD-10-CM | POA: Diagnosis not present

## 2016-09-10 DIAGNOSIS — C434 Malignant melanoma of scalp and neck: Secondary | ICD-10-CM | POA: Diagnosis not present

## 2016-09-10 DIAGNOSIS — Z48817 Encounter for surgical aftercare following surgery on the skin and subcutaneous tissue: Secondary | ICD-10-CM | POA: Diagnosis not present

## 2016-09-10 DIAGNOSIS — Z7982 Long term (current) use of aspirin: Secondary | ICD-10-CM | POA: Diagnosis not present

## 2016-09-14 DIAGNOSIS — C434 Malignant melanoma of scalp and neck: Secondary | ICD-10-CM | POA: Diagnosis not present

## 2016-09-14 DIAGNOSIS — Z7982 Long term (current) use of aspirin: Secondary | ICD-10-CM | POA: Diagnosis not present

## 2016-09-14 DIAGNOSIS — Z48817 Encounter for surgical aftercare following surgery on the skin and subcutaneous tissue: Secondary | ICD-10-CM | POA: Diagnosis not present

## 2016-09-26 ENCOUNTER — Other Ambulatory Visit: Payer: Self-pay | Admitting: Internal Medicine

## 2016-09-29 DIAGNOSIS — L570 Actinic keratosis: Secondary | ICD-10-CM | POA: Diagnosis not present

## 2016-09-29 DIAGNOSIS — L720 Epidermal cyst: Secondary | ICD-10-CM | POA: Diagnosis not present

## 2016-09-29 DIAGNOSIS — Z85820 Personal history of malignant melanoma of skin: Secondary | ICD-10-CM | POA: Diagnosis not present

## 2016-09-29 DIAGNOSIS — L821 Other seborrheic keratosis: Secondary | ICD-10-CM | POA: Diagnosis not present

## 2016-09-29 DIAGNOSIS — D1801 Hemangioma of skin and subcutaneous tissue: Secondary | ICD-10-CM | POA: Diagnosis not present

## 2016-09-29 DIAGNOSIS — D235 Other benign neoplasm of skin of trunk: Secondary | ICD-10-CM | POA: Diagnosis not present

## 2016-10-16 ENCOUNTER — Other Ambulatory Visit: Payer: Self-pay | Admitting: Internal Medicine

## 2016-10-24 ENCOUNTER — Other Ambulatory Visit: Payer: Self-pay | Admitting: Internal Medicine

## 2016-10-30 ENCOUNTER — Other Ambulatory Visit: Payer: Self-pay | Admitting: Internal Medicine

## 2016-11-22 ENCOUNTER — Other Ambulatory Visit: Payer: Self-pay | Admitting: Internal Medicine

## 2016-11-27 ENCOUNTER — Other Ambulatory Visit: Payer: Self-pay | Admitting: Internal Medicine

## 2016-12-15 ENCOUNTER — Telehealth: Payer: Self-pay | Admitting: Internal Medicine

## 2016-12-20 ENCOUNTER — Other Ambulatory Visit: Payer: Self-pay | Admitting: Internal Medicine

## 2016-12-20 NOTE — Telephone Encounter (Addendum)
Pt is out of med. Pt would callback today [phone]

## 2016-12-20 NOTE — Telephone Encounter (Signed)
Pt also needs alprazolam

## 2016-12-22 NOTE — Telephone Encounter (Signed)
Okay to refill? 

## 2016-12-22 NOTE — Telephone Encounter (Signed)
Okay to refill #60

## 2017-01-05 ENCOUNTER — Ambulatory Visit (INDEPENDENT_AMBULATORY_CARE_PROVIDER_SITE_OTHER): Payer: Medicare Other | Admitting: Internal Medicine

## 2017-01-05 ENCOUNTER — Encounter: Payer: Self-pay | Admitting: Internal Medicine

## 2017-01-05 VITALS — BP 142/84 | HR 62 | Temp 98.7°F | Wt 213.4 lb

## 2017-01-05 DIAGNOSIS — I6529 Occlusion and stenosis of unspecified carotid artery: Secondary | ICD-10-CM | POA: Diagnosis not present

## 2017-01-05 DIAGNOSIS — R7302 Impaired glucose tolerance (oral): Secondary | ICD-10-CM | POA: Diagnosis not present

## 2017-01-05 DIAGNOSIS — R7989 Other specified abnormal findings of blood chemistry: Secondary | ICD-10-CM

## 2017-01-05 DIAGNOSIS — R946 Abnormal results of thyroid function studies: Secondary | ICD-10-CM | POA: Diagnosis not present

## 2017-01-05 DIAGNOSIS — E7800 Pure hypercholesterolemia, unspecified: Secondary | ICD-10-CM | POA: Diagnosis not present

## 2017-01-05 DIAGNOSIS — I10 Essential (primary) hypertension: Secondary | ICD-10-CM | POA: Diagnosis not present

## 2017-01-05 NOTE — Progress Notes (Signed)
   Subjective:    Patient ID: Randall Mcgee, male    DOB: [birth year], 76 y.o.   MRN: [id number]  HPI  See above  Review of Systems See above    Objective:   Physical Exam   See above     Assessment & Plan:  See above

## 2017-01-05 NOTE — Patient Instructions (Signed)
Limit your sodium (Salt) intake    It is important that you exercise regularly, at least 20 minutes 3 to 4 times per week.  If you develop chest pain or shortness of breath seek  medical attention.  Please check your blood pressure on a regular basis.  If it is consistently greater than 150/90, please make an office appointment.  You need to lose weight.  Consider a lower calorie diet and regular exercise.  Return in 6 months for follow-up

## 2017-01-05 NOTE — Progress Notes (Signed)
Subjective:    Patient ID: Randall Mcgee, male    DOB: [birth year], 76 y.o.   MRN: [id number]  HPI  76 year old patient who is seen today for his six-month follow-up.  He has completed surgery and skin grafting for a scalp melanoma He has a history of hypertension and dyslipidemia.  He has carotid artery stenosis, status post right CEA Doing quite well.  No focal neurological symptoms No cardiopulmonary complaints  Past Medical History:  Diagnosis Date  . Cancer ([place]) 2017   melanoma on scalp  . History of chicken pox   . Hyperlipidemia   . Hypertension      Social History   Social History  . Marital status: Married    Spouse name: N/A  . Number of children: N/A  . Years of education: N/A   Occupational History  . Not on file.   Social History Main Topics  . Smoking status: Never Smoker  . Smokeless tobacco: Never Used  . Alcohol use No  . Drug use: No  . Sexual activity: Yes   Other Topics Concern  . Not on file   Social History Narrative  . No narrative on file    Past Surgical History:  Procedure Laterality Date  . APPLICATION OF A-CELL OF EXTREMITY N/A 07/13/2016   Procedure: APPLICATION OF A-CELL;  Surgeon: Wallace Going, DO;  Location: [place];  Service: Plastics;  Laterality: N/A;  . APPLICATION OF A-CELL OF HEAD/NECK N/A 06/28/2016   Procedure: APPLICATION OF A-CELL OF HEAD/NECK;  Surgeon: Wallace Going, DO;  Location: [facility];  Service: Plastics;  Laterality: N/A;  . APPLICATION OF A-CELL OF HEAD/NECK N/A 07/26/2016   Procedure: APPLICATION OF ACELL OF HEAD;  Surgeon: Wallace Going, DO;  Location: [facility];  Service: Plastics;  Laterality: N/A;  . APPLICATION OF A-CELL OF HEAD/NECK N/A 08/31/2016   Procedure: APPLICATION OF A-CELL OF HEAD/NECK;  Surgeon: Wallace Going, DO;  Location: [facility] [place];  Service: Plastics;  Laterality: N/A;  . CHOLECYSTECTOMY    .  ENDARTERECTOMY  06/20/11  . ENDARTERECTOMY  06/20/2011   Procedure: ENDARTERECTOMY CAROTID;  Surgeon: Elam Dutch, MD;  Location: [facility] OR;  Service: Vascular;  Laterality: Right;  Right Carotid endarterectomy with dacron patch angioplasty   . MASS EXCISION N/A 06/28/2016   Procedure: EXCISION SCALP MELANOMA;  Surgeon: Wallace Going, DO;  Location: [place];  Service: Plastics;  Laterality: N/A;  . MASS EXCISION N/A 07/13/2016   Procedure: RE- EXCISION OF SCALP MELANOMA;  Surgeon: Wallace Going, DO;  Location: [place];  Service: Plastics;  Laterality: N/A;  . MASS EXCISION N/A 07/26/2016   Procedure: RE-EXCISION OF SCALP MELANOMA FOR POSITIVE MARGAIN;  Surgeon: Wallace Going, DO;  Location: [place];  Service: Plastics;  Laterality: N/A;  . SKIN SPLIT GRAFT Left 08/31/2016   Procedure: SKIN GRAFT SPLIT THICKNESS TO HIS SCALP FROM HIS THIGH;  Surgeon: Wallace Going, DO;  Location: [place];  Service: Plastics;  Laterality: Left;  . TONSILLECTOMY    . TONSILLECTOMY      Family History  Problem Relation Age of Onset  . Heart disease Mother     No Known Allergies  Current Outpatient Prescriptions on File Prior to Visit  Medication Sig Dispense Refill  . ALPRAZolam (XANAX) 0.5 MG tablet TAKE 1 TABLET BY MOUTH EVERY NIGHT AT BEDTIME AS NEEDED FOR SLEEP 60  tablet 0  . aspirin 81 MG tablet Take 81 mg by mouth daily.    . indomethacin (INDOCIN) 25 MG capsule TAKE 1 CAPSULE(25 MG) BY MOUTH THREE TIMES DAILY AS NEEDED 270 capsule 4  . lisinopril (PRINIVIL,ZESTRIL) 20 MG tablet TAKE 1 TABLET BY MOUTH DAILY 90 tablet 0  . Multiple Vitamins-Minerals (CENTRUM SILVER PO) Take 1 tablet by mouth daily.     . Omega-3 Fatty Acids (FISH OIL) 1200 MG CAPS Take 1,200 mg by mouth daily.     . propranolol (INDERAL) 20 MG tablet TAKE 1 TABLET BY MOUTH TWICE DAILY. THIS REPLACES NADOLOL. 180 tablet 3  . sildenafil (REVATIO)  20 MG tablet TAKE 2 TABLETS BY MOUTH DAILY AS DIRECTED 180 tablet 3  . simvastatin (ZOCOR) 40 MG tablet TAKE 1 TABLET BY MOUTH EVERY EVENING 90 tablet 0   No current facility-administered medications on file prior to visit.     BP (!) 142/84 (BP Location: Left Arm, Patient Position: Sitting, Cuff Size: Normal)   Pulse 62   Temp 98.7 F (37.1 C) (Oral)   Wt 213 lb 6.4 oz (96.8 kg)   SpO2 96%   BMI 32.45 kg/m     Review of Systems  Constitutional: Negative for appetite change, chills, fatigue and fever.  HENT: Negative for congestion, dental problem, ear pain, hearing loss, sore throat, tinnitus, trouble swallowing and voice change.   Eyes: Negative for pain, discharge and visual disturbance.  Respiratory: Negative for cough, chest tightness, wheezing and stridor.   Cardiovascular: Negative for chest pain, palpitations and leg swelling.  Gastrointestinal: Negative for abdominal distention, abdominal pain, blood in stool, constipation, diarrhea, nausea and vomiting.  Genitourinary: Negative for difficulty urinating, discharge, flank pain, genital sores, hematuria and urgency.  Musculoskeletal: Negative for arthralgias, back pain, gait problem, joint swelling, myalgias and neck stiffness.  Skin: Negative for rash.  Neurological: Negative for dizziness, syncope, speech difficulty, weakness, numbness and headaches.  Hematological: Negative for adenopathy. Does not bruise/bleed easily.  Psychiatric/Behavioral: Negative for behavioral problems and dysphoric mood. The patient is not nervous/anxious.        Objective:   Physical Exam  Constitutional: He is oriented to person, place, and time. He appears well-developed.  Blood pressure 134/80  HENT:  Head: Normocephalic.  Right Ear: External ear normal.  Left Ear: External ear normal.  Eyes: Conjunctivae and EOM are normal.  Neck: Normal range of motion.  Status post right CEA  Cardiovascular: Normal rate and normal heart sounds.     Pulmonary/Chest: Breath sounds normal.  Abdominal: Bowel sounds are normal.  Musculoskeletal: Normal range of motion. He exhibits no edema or tenderness.  Neurological: He is alert and oriented to person, place, and time.  Skin:  Nicely healed skin graft scalp region  Psychiatric: He has a normal mood and affect. His behavior is normal.          Assessment & Plan:   Essential hypertension Weight gain.  Efforts at weight loss encouraged with more activity and caloric restriction Dyslipidemia Impaired glucose tolerance  CPX 6 months No change in medical regimen More  active lifestyle.  Modest weight loss.  All encouraged  Nyoka Cowden

## 2017-01-28 ENCOUNTER — Other Ambulatory Visit: Payer: Self-pay | Admitting: Internal Medicine

## 2017-02-13 ENCOUNTER — Other Ambulatory Visit: Payer: Self-pay | Admitting: Internal Medicine

## 2017-02-14 ENCOUNTER — Other Ambulatory Visit: Payer: Self-pay | Admitting: Internal Medicine

## 2017-03-12 ENCOUNTER — Other Ambulatory Visit: Payer: Self-pay | Admitting: Internal Medicine

## 2017-03-13 ENCOUNTER — Other Ambulatory Visit: Payer: Self-pay | Admitting: Internal Medicine

## 2017-04-09 ENCOUNTER — Other Ambulatory Visit: Payer: Self-pay | Admitting: Internal Medicine

## 2017-04-11 DIAGNOSIS — D225 Melanocytic nevi of trunk: Secondary | ICD-10-CM | POA: Diagnosis not present

## 2017-04-11 DIAGNOSIS — L578 Other skin changes due to chronic exposure to nonionizing radiation: Secondary | ICD-10-CM | POA: Diagnosis not present

## 2017-04-11 DIAGNOSIS — L570 Actinic keratosis: Secondary | ICD-10-CM | POA: Diagnosis not present

## 2017-04-11 DIAGNOSIS — L814 Other melanin hyperpigmentation: Secondary | ICD-10-CM | POA: Diagnosis not present

## 2017-04-11 DIAGNOSIS — Z85820 Personal history of malignant melanoma of skin: Secondary | ICD-10-CM | POA: Diagnosis not present

## 2017-04-11 DIAGNOSIS — L821 Other seborrheic keratosis: Secondary | ICD-10-CM | POA: Diagnosis not present

## 2017-04-16 ENCOUNTER — Other Ambulatory Visit: Payer: Self-pay | Admitting: Internal Medicine

## 2017-05-10 ENCOUNTER — Other Ambulatory Visit: Payer: Self-pay | Admitting: Internal Medicine

## 2017-05-18 ENCOUNTER — Ambulatory Visit (INDEPENDENT_AMBULATORY_CARE_PROVIDER_SITE_OTHER): Payer: Medicare Other

## 2017-05-18 DIAGNOSIS — Z23 Encounter for immunization: Secondary | ICD-10-CM | POA: Diagnosis not present

## 2017-06-07 ENCOUNTER — Other Ambulatory Visit: Payer: Self-pay | Admitting: Internal Medicine

## 2017-06-08 ENCOUNTER — Other Ambulatory Visit: Payer: Self-pay | Admitting: Internal Medicine

## 2017-06-11 ENCOUNTER — Other Ambulatory Visit: Payer: Self-pay | Admitting: Internal Medicine

## 2017-07-04 ENCOUNTER — Ambulatory Visit: Payer: Medicare Other | Admitting: Internal Medicine

## 2017-07-06 ENCOUNTER — Ambulatory Visit (INDEPENDENT_AMBULATORY_CARE_PROVIDER_SITE_OTHER): Payer: Medicare Other | Admitting: Internal Medicine

## 2017-07-06 ENCOUNTER — Encounter: Payer: Self-pay | Admitting: Internal Medicine

## 2017-07-06 VITALS — BP 142/84 | HR 71 | Temp 98.2°F | Ht 68.0 in | Wt 216.0 lb

## 2017-07-06 DIAGNOSIS — R7989 Other specified abnormal findings of blood chemistry: Secondary | ICD-10-CM

## 2017-07-06 DIAGNOSIS — I10 Essential (primary) hypertension: Secondary | ICD-10-CM

## 2017-07-06 DIAGNOSIS — C439 Malignant melanoma of skin, unspecified: Secondary | ICD-10-CM

## 2017-07-06 DIAGNOSIS — I6529 Occlusion and stenosis of unspecified carotid artery: Secondary | ICD-10-CM

## 2017-07-06 DIAGNOSIS — E7800 Pure hypercholesterolemia, unspecified: Secondary | ICD-10-CM | POA: Diagnosis not present

## 2017-07-06 LAB — CBC WITH DIFFERENTIAL/PLATELET
BASOS ABS: 0 10*3/uL (ref 0.0–0.1)
Basophils Relative: 0.7 % (ref 0.0–3.0)
EOS PCT: 1.6 % (ref 0.0–5.0)
Eosinophils Absolute: 0.1 10*3/uL (ref 0.0–0.7)
HCT: 41.4 % (ref 39.0–52.0)
Hemoglobin: 14.1 g/dL (ref 13.0–17.0)
LYMPHS ABS: 2.3 10*3/uL (ref 0.7–4.0)
Lymphocytes Relative: 36.6 % (ref 12.0–46.0)
MCHC: 34 g/dL (ref 30.0–36.0)
MCV: 100.7 fl — ABNORMAL HIGH (ref 78.0–100.0)
MONO ABS: 0.8 10*3/uL (ref 0.1–1.0)
Monocytes Relative: 13.1 % — ABNORMAL HIGH (ref 3.0–12.0)
NEUTROS ABS: 3 10*3/uL (ref 1.4–7.7)
NEUTROS PCT: 48 % (ref 43.0–77.0)
PLATELETS: 187 10*3/uL (ref 150.0–400.0)
RBC: 4.11 Mil/uL — AB (ref 4.22–5.81)
RDW: 13.1 % (ref 11.5–15.5)
WBC: 6.2 10*3/uL (ref 4.0–10.5)

## 2017-07-06 LAB — COMPREHENSIVE METABOLIC PANEL
ALK PHOS: 39 U/L (ref 39–117)
ALT: 17 U/L (ref 0–53)
AST: 29 U/L (ref 0–37)
Albumin: 4.1 g/dL (ref 3.5–5.2)
BILIRUBIN TOTAL: 0.7 mg/dL (ref 0.2–1.2)
BUN: 20 mg/dL (ref 6–23)
CO2: 32 meq/L (ref 19–32)
Calcium: 9.6 mg/dL (ref 8.4–10.5)
Chloride: 105 mEq/L (ref 96–112)
Creatinine, Ser: 1.24 mg/dL (ref 0.40–1.50)
GFR: 59.8 mL/min — ABNORMAL LOW (ref >60.00)
GLUCOSE: 108 mg/dL — AB (ref 70–99)
Potassium: 4.6 mEq/L (ref 3.5–5.1)
SODIUM: 143 meq/L (ref 135–145)
TOTAL PROTEIN: 6.6 g/dL (ref 6.0–8.3)

## 2017-07-06 LAB — TSH: TSH: 4.92 u[IU]/mL — AB (ref 0.35–4.50)

## 2017-07-06 LAB — LIPID PANEL
CHOL/HDL RATIO: 4
Cholesterol: 159 mg/dL (ref 0–200)
HDL: 43.4 mg/dL (ref >39.00)
LDL Cholesterol: 93 mg/dL (ref 0–99)
NONHDL: 115.6
Triglycerides: 113 mg/dL (ref 0.0–149.0)
VLDL: 22.6 mg/dL (ref 0.0–40.0)

## 2017-07-06 MED ORDER — ALPRAZOLAM 0.5 MG PO TABS: 0.5000 mg | ORAL_TABLET | Freq: Every evening | ORAL | 0 refills | Status: DC | PRN

## 2017-07-06 NOTE — Patient Instructions (Signed)
Limit your sodium (Salt) intake  Please check your blood pressure on a regular basis.  If it is consistently greater than 150/90, please make an office appointment.  Return in 6 months for follow-up   

## 2017-07-06 NOTE — Progress Notes (Signed)
Subjective:    Patient ID: Randall Mcgee, male    DOB: [birth year], 76 y.o.   MRN: [id number]  HPI  76 year old patient who is seen today for his biannual follow-up.  He has a history of essential hypertension and carotid artery disease.  He is status post right CEA.  He has a history of impaired glucose tolerance.  He has had a history of melanoma and is followed by dermatology.  He has dyslipidemia.  He denies any cardiopulmonary complaints.  Past Medical History:  Diagnosis Date  . Cancer ([place]) 2017   melanoma on scalp  . History of chicken pox   . Hyperlipidemia   . Hypertension      Social History   Socioeconomic History  . Marital status: Married    Spouse name: Not on file  . Number of children: Not on file  . Years of education: Not on file  . Highest education level: Not on file  Social Needs  . Financial resource strain: Not on file  . Food insecurity - worry: Not on file  . Food insecurity - inability: Not on file  . Transportation needs - medical: Not on file  . Transportation needs - non-medical: Not on file  Occupational History  . Not on file  Tobacco Use  . Smoking status: Never Smoker  . Smokeless tobacco: Never Used  Substance and Sexual Activity  . Alcohol use: No  . Drug use: No  . Sexual activity: Yes  Other Topics Concern  . Not on file  Social History Narrative  . Not on file    Past Surgical History:  Procedure Laterality Date  . APPLICATION OF A-CELL OF EXTREMITY N/A 07/13/2016   Procedure: APPLICATION OF A-CELL;  Surgeon: Wallace Going, DO;  Location: [place];  Service: Plastics;  Laterality: N/A;  . APPLICATION OF A-CELL OF HEAD/NECK N/A 06/28/2016   Procedure: APPLICATION OF A-CELL OF HEAD/NECK;  Surgeon: Wallace Going, DO;  Location: [place];  Service: Plastics;  Laterality: N/A;  . APPLICATION OF A-CELL OF HEAD/NECK N/A 07/26/2016   Procedure: APPLICATION OF ACELL OF HEAD;  Surgeon:  Wallace Going, DO;  Location: [facility];  Service: Plastics;  Laterality: N/A;  . APPLICATION OF A-CELL OF HEAD/NECK N/A 08/31/2016   Procedure: APPLICATION OF A-CELL OF HEAD/NECK;  Surgeon: Wallace Going, DO;  Location: [facility];  Service: Plastics;  Laterality: N/A;  . CHOLECYSTECTOMY    . ENDARTERECTOMY  06/20/11  . ENDARTERECTOMY  06/20/2011   Procedure: ENDARTERECTOMY CAROTID;  Surgeon: Elam Dutch, MD;  Location: [facility] OR;  Service: Vascular;  Laterality: Right;  Right Carotid endarterectomy with dacron patch angioplasty   . MASS EXCISION N/A 06/28/2016   Procedure: EXCISION SCALP MELANOMA;  Surgeon: Wallace Going, DO;  Location: [facility];  Service: Plastics;  Laterality: N/A;  . MASS EXCISION N/A 07/13/2016   Procedure: RE- EXCISION OF SCALP MELANOMA;  Surgeon: Wallace Going, DO;  Location: [facility];  Service: Plastics;  Laterality: N/A;  . MASS EXCISION N/A 07/26/2016   Procedure: RE-EXCISION OF SCALP MELANOMA FOR POSITIVE MARGAIN;  Surgeon: Wallace Going, DO;  Location: [place];  Service: Plastics;  Laterality: N/A;  . SKIN SPLIT GRAFT Left 08/31/2016   Procedure: SKIN GRAFT SPLIT THICKNESS TO HIS SCALP FROM HIS THIGH;  Surgeon: Wallace Going, DO;  Location: [place];  Service: Plastics;  Laterality:  Left;  . TONSILLECTOMY    . TONSILLECTOMY      Family History  Problem Relation Age of Onset  . Heart disease Mother     No Known Allergies  Current Outpatient Medications on File Prior to Visit  Medication Sig Dispense Refill  . aspirin 81 MG tablet Take 81 mg by mouth daily.    . Cyanocobalamin (VITAMIN B-12 PO) Take 1,000 mg by mouth daily.    . indomethacin (INDOCIN) 25 MG capsule TAKE 1 CAPSULE(25 MG) BY MOUTH THREE TIMES DAILY AS NEEDED 270 capsule 4  . lisinopril (PRINIVIL,ZESTRIL) 20 MG tablet TAKE 1 TABLET BY MOUTH DAILY 90 tablet 0  .  Multiple Vitamins-Minerals (CENTRUM SILVER PO) Take 1 tablet by mouth daily.     . propranolol (INDERAL) 20 MG tablet TAKE 1 TABLET BY MOUTH TWICE DAILY. THIS REPLACES NADOLOL. 60 tablet 0  . sildenafil (REVATIO) 20 MG tablet TAKE 2 TABLETS BY MOUTH DAILY AS DIRECTED 180 tablet 3  . simvastatin (ZOCOR) 40 MG tablet TAKE 1 TABLET BY MOUTH EVERY EVENING 90 tablet 0   No current facility-administered medications on file prior to visit.     BP (!) 142/84 (BP Location: Left Arm, Patient Position: Sitting, Cuff Size: Normal)   Pulse 71   Temp 98.2 F (36.8 C) (Oral)   Ht 5\' 8"  (1.727 m)   Wt 216 lb (98 kg)   SpO2 96%   BMI 32.84 kg/m     Review of Systems  Constitutional: Negative for appetite change, chills, fatigue and fever.  HENT: Positive for congestion and rhinorrhea. Negative for dental problem, ear pain, hearing loss, sore throat, tinnitus, trouble swallowing and voice change.   Eyes: Negative for pain, discharge and visual disturbance.  Respiratory: Negative for cough, chest tightness, wheezing and stridor.   Cardiovascular: Negative for chest pain, palpitations and leg swelling.  Gastrointestinal: Positive for abdominal pain. Negative for abdominal distention, blood in stool, constipation, diarrhea, nausea and vomiting.  Genitourinary: Negative for difficulty urinating, discharge, flank pain, genital sores, hematuria and urgency.  Musculoskeletal: Positive for arthralgias. Negative for back pain, gait problem, joint swelling, myalgias and neck stiffness.  Skin: Positive for rash.  Neurological: Negative for dizziness, syncope, speech difficulty, weakness, numbness and headaches.  Hematological: Negative for adenopathy. Does not bruise/bleed easily.  Psychiatric/Behavioral: Negative for behavioral problems and dysphoric mood. The patient is not nervous/anxious.        Objective:   Physical Exam  Constitutional: He is oriented to person, place, and time. He appears  well-developed.  HENT:  Head: Normocephalic.  Right Ear: External ear normal.  Left Ear: External ear normal.  Eyes: Conjunctivae and EOM are normal.  Neck: Normal range of motion.  Cardiovascular: Normal rate and normal heart sounds.  Pulmonary/Chest: Breath sounds normal.  Abdominal: Bowel sounds are normal.  Musculoskeletal: Normal range of motion. He exhibits no edema or tenderness.  Neurological: He is alert and oriented to person, place, and time.  Skin:  Nonspecific maculopapular rash involving the anterior chest and abdomen  Psychiatric: He has a normal mood and affect. His behavior is normal.          Assessment & Plan:   Essential hypertension.  Stable no change in therapy Dyslipidemia continue statin therapy Status post right CEA insomnia   CPX 6 months Medications updated No change in medical therapy  KWIATKOWSKI,PETER FRANK No

## 2017-07-15 ENCOUNTER — Other Ambulatory Visit: Payer: Self-pay | Admitting: Internal Medicine

## 2017-08-02 ENCOUNTER — Other Ambulatory Visit: Payer: Self-pay | Admitting: Internal Medicine

## 2017-08-21 ENCOUNTER — Other Ambulatory Visit: Payer: Self-pay | Admitting: Internal Medicine

## 2017-09-04 ENCOUNTER — Other Ambulatory Visit: Payer: Self-pay | Admitting: Internal Medicine

## 2017-09-09 ENCOUNTER — Other Ambulatory Visit: Payer: Self-pay | Admitting: Internal Medicine

## 2017-09-21 ENCOUNTER — Telehealth: Payer: Self-pay | Admitting: Internal Medicine

## 2017-09-21 ENCOUNTER — Other Ambulatory Visit: Payer: Self-pay

## 2017-09-21 ENCOUNTER — Telehealth: Payer: Self-pay

## 2017-09-21 MED ORDER — ALPRAZOLAM 0.5 MG PO TABS: 0.5000 mg | ORAL_TABLET | Freq: Every evening | ORAL | 0 refills | Status: DC | PRN

## 2017-09-21 NOTE — Telephone Encounter (Signed)
Last prescription voided. Medication is changed from 60 tabs to 30 per Dr.Jordan.

## 2017-09-21 NOTE — Telephone Encounter (Signed)
Patient needs a refill for Alprazolam 0.5 Mg tabs escribed to the pharmacy.  Pharmacy: [facility] on [address]

## 2017-09-21 NOTE — Telephone Encounter (Signed)
Medication sent in electronically patient notified and was told he will receive 30 not 60 and will get the next 30 when Dr.Kwiatkowski.

## 2017-09-24 NOTE — Telephone Encounter (Signed)
Left message for patient to return phone call. Medication sent to pharmacy.

## 2017-09-26 ENCOUNTER — Other Ambulatory Visit: Payer: Self-pay | Admitting: Internal Medicine

## 2017-09-27 ENCOUNTER — Other Ambulatory Visit: Payer: Self-pay | Admitting: Internal Medicine

## 2017-09-28 NOTE — Telephone Encounter (Signed)
Sent to the pharmacy by e-scribe.  Pt has upcoming appt on 01/10/18.

## 2017-10-09 DIAGNOSIS — L821 Other seborrheic keratosis: Secondary | ICD-10-CM | POA: Diagnosis not present

## 2017-10-09 DIAGNOSIS — L570 Actinic keratosis: Secondary | ICD-10-CM | POA: Diagnosis not present

## 2017-10-09 DIAGNOSIS — D1801 Hemangioma of skin and subcutaneous tissue: Secondary | ICD-10-CM | POA: Diagnosis not present

## 2017-10-09 DIAGNOSIS — L814 Other melanin hyperpigmentation: Secondary | ICD-10-CM | POA: Diagnosis not present

## 2017-10-09 DIAGNOSIS — Z85820 Personal history of malignant melanoma of skin: Secondary | ICD-10-CM | POA: Diagnosis not present

## 2017-10-09 DIAGNOSIS — L820 Inflamed seborrheic keratosis: Secondary | ICD-10-CM | POA: Diagnosis not present

## 2017-10-09 DIAGNOSIS — D485 Neoplasm of uncertain behavior of skin: Secondary | ICD-10-CM | POA: Diagnosis not present

## 2017-10-09 DIAGNOSIS — D225 Melanocytic nevi of trunk: Secondary | ICD-10-CM | POA: Diagnosis not present

## 2017-10-18 ENCOUNTER — Other Ambulatory Visit: Payer: Self-pay | Admitting: Family Medicine

## 2017-10-21 NOTE — Telephone Encounter (Signed)
Okay for refill?  

## 2017-10-22 NOTE — Telephone Encounter (Signed)
Okay for refill?  

## 2017-10-25 DIAGNOSIS — D485 Neoplasm of uncertain behavior of skin: Secondary | ICD-10-CM | POA: Diagnosis not present

## 2017-10-25 DIAGNOSIS — C44521 Squamous cell carcinoma of skin of breast: Secondary | ICD-10-CM | POA: Diagnosis not present

## 2017-11-16 ENCOUNTER — Other Ambulatory Visit: Payer: Self-pay | Admitting: Internal Medicine

## 2017-11-20 NOTE — Telephone Encounter (Signed)
Pt. Is calling as [facility] still does not have his prescription as of yet.   Pt. Says this happens every month.  Having to wait for prescription.  Asking if can send in for 3 months at a time so doesn't have to go through this every month. Pharmacy sends 2 times and he always has to call too.    [facility] [place], [address]  [place] [address]  Phone: [phone] Fax: [phone]

## 2017-11-21 ENCOUNTER — Other Ambulatory Visit: Payer: Self-pay

## 2017-11-21 NOTE — Telephone Encounter (Signed)
Patient is calling to follow up on this. Please advise.  °

## 2017-12-04 ENCOUNTER — Other Ambulatory Visit: Payer: Self-pay | Admitting: Internal Medicine

## 2017-12-18 ENCOUNTER — Other Ambulatory Visit: Payer: Self-pay | Admitting: Internal Medicine

## 2017-12-18 NOTE — Telephone Encounter (Signed)
Control 

## 2018-01-10 ENCOUNTER — Encounter: Payer: Self-pay | Admitting: Internal Medicine

## 2018-01-10 ENCOUNTER — Ambulatory Visit (INDEPENDENT_AMBULATORY_CARE_PROVIDER_SITE_OTHER): Payer: Medicare Other | Admitting: Internal Medicine

## 2018-01-10 VITALS — BP 112/72 | HR 72 | Temp 98.8°F | Ht 68.0 in | Wt 214.8 lb

## 2018-01-10 DIAGNOSIS — I6529 Occlusion and stenosis of unspecified carotid artery: Secondary | ICD-10-CM

## 2018-01-10 DIAGNOSIS — R7302 Impaired glucose tolerance (oral): Secondary | ICD-10-CM

## 2018-01-10 DIAGNOSIS — I10 Essential (primary) hypertension: Secondary | ICD-10-CM | POA: Diagnosis not present

## 2018-01-10 DIAGNOSIS — E039 Hypothyroidism, unspecified: Secondary | ICD-10-CM

## 2018-01-10 DIAGNOSIS — E7800 Pure hypercholesterolemia, unspecified: Secondary | ICD-10-CM | POA: Diagnosis not present

## 2018-01-10 DIAGNOSIS — Z0000 Encounter for general adult medical examination without abnormal findings: Secondary | ICD-10-CM | POA: Diagnosis not present

## 2018-01-10 DIAGNOSIS — R7989 Other specified abnormal findings of blood chemistry: Secondary | ICD-10-CM

## 2018-01-10 LAB — TSH: TSH: 5.51 u[IU]/mL — AB (ref 0.35–4.50)

## 2018-01-10 MED ORDER — ALPRAZOLAM 0.5 MG PO TABS: 0.5000 mg | ORAL_TABLET | Freq: Two times a day (BID) | ORAL | 0 refills | Status: DC | PRN

## 2018-01-10 NOTE — Patient Instructions (Signed)
Return in 4 months for follow-up and to establish with a new provider  Limit your sodium (Salt) intake  Please check your blood pressure on a regular basis.  If it is consistently greater than 150/90, please make an office appointment.

## 2018-01-14 ENCOUNTER — Other Ambulatory Visit: Payer: Self-pay | Admitting: Internal Medicine

## 2018-01-14 ENCOUNTER — Encounter: Payer: Self-pay | Admitting: Internal Medicine

## 2018-01-14 ENCOUNTER — Ambulatory Visit (HOSPITAL_COMMUNITY)
Admission: RE | Admit: 2018-01-14 | Discharge: 2018-01-14 | Disposition: A | Discharge status: Home / Self Care | Payer: Medicare Other | Source: Ambulatory Visit | Attending: Vascular Surgery | Admitting: Vascular Surgery

## 2018-01-14 DIAGNOSIS — Z9889 Other specified postprocedural states: Secondary | ICD-10-CM | POA: Diagnosis not present

## 2018-01-14 DIAGNOSIS — I6529 Occlusion and stenosis of unspecified carotid artery: Secondary | ICD-10-CM

## 2018-01-14 DIAGNOSIS — I6523 Occlusion and stenosis of bilateral carotid arteries: Secondary | ICD-10-CM | POA: Insufficient documentation

## 2018-01-14 NOTE — Progress Notes (Signed)
Subjective:    Patient ID: Randall Mcgee, male    DOB: May 21, 1941, 77 y.o.   MRN: [id number]  HPI  77 year old patient who is seen today for his six-month follow-up and for a subsequent Medicare wellness visit.  He continues to do quite well.  He has essential hypertension as well as carotid artery stenosis.  Status post prior right CEA. Prior history of scalp melanoma.  Past Medical History:  Diagnosis Date  . Cancer ([place]) 2017   melanoma on scalp  . History of chicken pox   . Hyperlipidemia   . Hypertension      Social History   Socioeconomic History  . Marital status: Married    Spouse name: Not on file  . Number of children: Not on file  . Years of education: Not on file  . Highest education level: Not on file  Occupational History  . Not on file  Social Needs  . Financial resource strain: Not on file  . Food insecurity:    Worry: Not on file    Inability: Not on file  . Transportation needs:    Medical: Not on file    Non-medical: Not on file  Tobacco Use  . Smoking status: Never Smoker  . Smokeless tobacco: Never Used  Substance and Sexual Activity  . Alcohol use: No  . Drug use: No    Types: Other-see comments  . Sexual activity: Yes  Lifestyle  . Physical activity:    Days per week: Not on file    Minutes per session: Not on file  . Stress: Not on file  Relationships  . Social connections:    Talks on phone: Not on file    Gets together: Not on file    Attends religious service: Not on file    Active member of club or organization: Not on file    Attends meetings of clubs or organizations: Not on file    Relationship status: Not on file  . Intimate partner violence:    Fear of current or ex partner: Not on file    Emotionally abused: Not on file    Physically abused: Not on file    Forced sexual activity: Not on file  Other Topics Concern  . Not on file  Social History Narrative  . Not on file    Past Surgical History:  Procedure  Laterality Date  . APPLICATION OF A-CELL OF EXTREMITY N/A 07/13/2016   Procedure: APPLICATION OF A-CELL;  Surgeon: Wallace Going, DO;  Location: [place];  Service: Plastics;  Laterality: N/A;  . APPLICATION OF A-CELL OF HEAD/NECK N/A 06/28/2016   Procedure: APPLICATION OF A-CELL OF HEAD/NECK;  Surgeon: Wallace Going, DO;  Location: [place];  Service: Plastics;  Laterality: N/A;  . APPLICATION OF A-CELL OF HEAD/NECK N/A 07/26/2016   Procedure: APPLICATION OF ACELL OF HEAD;  Surgeon: Wallace Going, DO;  Location: [place];  Service: Plastics;  Laterality: N/A;  . APPLICATION OF A-CELL OF HEAD/NECK N/A 08/31/2016   Procedure: APPLICATION OF A-CELL OF HEAD/NECK;  Surgeon: Wallace Going, DO;  Location: [place];  Service: Plastics;  Laterality: N/A;  . CHOLECYSTECTOMY    . ENDARTERECTOMY  06/20/11  . ENDARTERECTOMY  06/20/2011   Procedure: ENDARTERECTOMY CAROTID;  Surgeon: Elam Dutch, MD;  Location: [facility] OR;  Service: Vascular;  Laterality: Right;  Right Carotid endarterectomy with dacron patch angioplasty   . MASS EXCISION N/A 06/28/2016  Procedure: EXCISION SCALP MELANOMA;  Surgeon: Wallace Going, DO;  Location: [place];  Service: Plastics;  Laterality: N/A;  . MASS EXCISION N/A 07/13/2016   Procedure: RE- EXCISION OF SCALP MELANOMA;  Surgeon: Wallace Going, DO;  Location: [place];  Service: Plastics;  Laterality: N/A;  . MASS EXCISION N/A 07/26/2016   Procedure: RE-EXCISION OF SCALP MELANOMA FOR POSITIVE MARGAIN;  Surgeon: Wallace Going, DO;  Location: [place];  Service: Plastics;  Laterality: N/A;  . SKIN SPLIT GRAFT Left 08/31/2016   Procedure: SKIN GRAFT SPLIT THICKNESS TO HIS SCALP FROM HIS THIGH;  Surgeon: Wallace Going, DO;  Location: [place];  Service: Plastics;  Laterality: Left;  . TONSILLECTOMY    .  TONSILLECTOMY      Family History  Problem Relation Age of Onset  . Heart disease Mother     No Known Allergies  Current Outpatient Medications on File Prior to Visit  Medication Sig Dispense Refill  . aspirin 81 MG tablet Take 81 mg by mouth daily.    . Cyanocobalamin (VITAMIN B-12 PO) Take 1,000 mg by mouth daily.    . indomethacin (INDOCIN) 25 MG capsule TAKE 1 CAPSULE(25 MG) BY MOUTH THREE TIMES DAILY AS NEEDED 270 capsule 4  . indomethacin (INDOCIN) 25 MG capsule TAKE 1 CAPSULE(25 MG) BY MOUTH THREE TIMES DAILY AS NEEDED 270 capsule 1  . lisinopril (PRINIVIL,ZESTRIL) 20 MG tablet TAKE 1 TABLET BY MOUTH DAILY 90 tablet 1  . Multiple Vitamins-Minerals (CENTRUM SILVER PO) Take 1 tablet by mouth daily.     . propranolol (INDERAL) 20 MG tablet TAKE 1 TABLET BY MOUTH TWICE DAILY(THIS REPLACES NADOLOL) 180 tablet 1  . sildenafil (REVATIO) 20 MG tablet TAKE 2 TABLETS BY MOUTH DAILY AS DIRECTED 180 tablet 3  . simvastatin (ZOCOR) 40 MG tablet TAKE 1 TABLET BY MOUTH EVERY EVENING 90 tablet 0   No current facility-administered medications on file prior to visit.     BP 112/72 (BP Location: Left Arm, Patient Position: Sitting, Cuff Size: Large)   Pulse 72   Temp 98.8 F (37.1 C) (Oral)   Ht 5\' 8"  (1.727 m)   Wt 214 lb 12.8 oz (97.4 kg)   SpO2 94%   BMI 32.66 kg/m    Subsequent Medicare wellness visit\\  1. Risk factors, based on past  M,S,F history.  Cardiovascular risk factors include a history of hypertension and dyslipidemia.  He has a history of carotid artery stenosis status post intervention.  He also has a history of impaired glucose tolerance  2.  Physical activities: Fairly active without restrictions  3.  Depression/mood: No history major depression or mood disorder  4.  Hearing: Minor deficits only  5.  ADL's: Independent  6.  Fall risk: Low.  No falls within the past year  7.  Home safety: No problems identified  8.  Height weight, and visual acuity; height and  weight stable no change in visual acuity  9.  Counseling: Continue heart healthy diet.  More rigorous exercise and activities encouraged  10. Lab orders based on risk factors: We will check laboratory update with lipid profile  11. Referral : Follow-up dermatology  12. Care plan: Continue efforts at aggressive risk factor modification  13. Cognitive assessment: Alert and appropriate normal affect.  No cognitive dysfunction  14. Screening: Patient provided with a written and personalized 5-10 year screening schedule in the AVS.    15. Provider List Update:  Primary care dermatology    Review of Systems  Constitutional: Negative for appetite change, chills, fatigue and fever.  HENT: Negative for congestion, dental problem, ear pain, hearing loss, sore throat, tinnitus, trouble swallowing and voice change.   Eyes: Negative for pain, discharge and visual disturbance.  Respiratory: Negative for cough, chest tightness, wheezing and stridor.   Cardiovascular: Negative for chest pain, palpitations and leg swelling.  Gastrointestinal: Negative for abdominal distention, abdominal pain, blood in stool, constipation, diarrhea, nausea and vomiting.  Genitourinary: Negative for difficulty urinating, discharge, flank pain, genital sores, hematuria and urgency.  Musculoskeletal: Negative for arthralgias, back pain, gait problem, joint swelling, myalgias and neck stiffness.  Skin: Negative for rash.  Neurological: Negative for dizziness, syncope, speech difficulty, weakness, numbness and headaches.  Hematological: Negative for adenopathy. Does not bruise/bleed easily.  Psychiatric/Behavioral: Negative for behavioral problems and dysphoric mood. The patient is not nervous/anxious.        Objective:   Physical Exam  Constitutional: He is oriented to person, place, and time. He appears well-developed.  Blood pressure low normal  HENT:  Head: Normocephalic.  Right Ear: External ear normal.  Left  Ear: External ear normal.  Upper dentures in place  Eyes: Conjunctivae and EOM are normal.  Neck: Normal range of motion.  Status post right CEA  Cardiovascular: Normal rate and normal heart sounds.  A few ectopics  Pulmonary/Chest: Breath sounds normal.  Abdominal: Bowel sounds are normal.  Right inguinal hernia  Genitourinary: Rectal exam shows guaiac negative stool.  Genitourinary Comments: +2 BPH  Musculoskeletal: Normal range of motion. He exhibits no edema or tenderness.  Neurological: He is alert and oriented to person, place, and time.  Skin:  Senile changes.  Surgical scar involving the scalp area  Psychiatric: He has a normal mood and affect. His behavior is normal.          Assessment & Plan:   Essential hypertension  dyslipidemia History of scalp melanoma  Medications updated Review updated lab Follow-up 6 months with new provider  Marletta Lor

## 2018-01-16 NOTE — Telephone Encounter (Signed)
FILLED ON 09/28/17 FOR 6 MONTHS.  REFILL REQUEST IS TOO EARLY.  MESSAGE SENT TO THE PHARMACY.

## 2018-01-17 ENCOUNTER — Other Ambulatory Visit: Payer: Self-pay | Admitting: Internal Medicine

## 2018-02-25 ENCOUNTER — Other Ambulatory Visit: Payer: Self-pay | Admitting: Internal Medicine

## 2018-03-04 ENCOUNTER — Other Ambulatory Visit: Payer: Self-pay | Admitting: Internal Medicine

## 2018-03-04 ENCOUNTER — Telehealth: Payer: Self-pay | Admitting: Internal Medicine

## 2018-03-04 DIAGNOSIS — M79673 Pain in unspecified foot: Secondary | ICD-10-CM

## 2018-03-04 NOTE — Telephone Encounter (Signed)
Okay for referral?

## 2018-03-04 NOTE — Telephone Encounter (Signed)
Copied from [place] [id number]. Topic: Referral - Request >> Mar 04, 2018  1:41 PM Robina Ade, Helene Kelp D wrote: Reason for CRM: Patient wife called and would like a referral to podiatry due to pt can't put his shoes on because it hurts him. Wife would like a call back from office.

## 2018-03-05 NOTE — Telephone Encounter (Signed)
Pt wife notified of referral.

## 2018-03-05 NOTE — Telephone Encounter (Signed)
Patient wife notified of results and verbalized understanding.

## 2018-03-14 ENCOUNTER — Ambulatory Visit (INDEPENDENT_AMBULATORY_CARE_PROVIDER_SITE_OTHER): Payer: Medicare Other | Admitting: Podiatry

## 2018-03-14 VITALS — BP 156/86 | HR 61

## 2018-03-14 DIAGNOSIS — I872 Venous insufficiency (chronic) (peripheral): Secondary | ICD-10-CM

## 2018-03-14 DIAGNOSIS — I739 Peripheral vascular disease, unspecified: Secondary | ICD-10-CM

## 2018-03-14 DIAGNOSIS — L603 Nail dystrophy: Secondary | ICD-10-CM

## 2018-03-14 DIAGNOSIS — I878 Other specified disorders of veins: Secondary | ICD-10-CM

## 2018-03-14 DIAGNOSIS — B351 Tinea unguium: Secondary | ICD-10-CM | POA: Diagnosis not present

## 2018-03-14 DIAGNOSIS — M79676 Pain in unspecified toe(s): Secondary | ICD-10-CM | POA: Diagnosis not present

## 2018-03-14 DIAGNOSIS — M79609 Pain in unspecified limb: Principal | ICD-10-CM

## 2018-03-14 NOTE — Progress Notes (Signed)
Subjective:  Patient ID: Randall Mcgee, male    DOB: 06-12-1941,  MRN: [id number]  Chief Complaint  Patient presents with  . Nail Problem    bilateral 3rd toenails are very thick and painful - left looks like "Ram's horn" toenail    77 y.o. male presents with the above complaint.  Reports thickened toenails to both feet worse on the third toes.  Believes that he has a rams horn nail left third toe.  Went to a pedicure place however they would not debride the nails due to concerns for fungus.  Present for many years without treatment.    History of melanoma affecting the scalp.  No similar lesions present to both feet.  Review of Systems: Negative except as noted in the HPI. Denies N/V/F/Ch.  Past Medical History:  Diagnosis Date  . Cancer ([place]) 2017   melanoma on scalp  . History of chicken pox   . Hyperlipidemia   . Hypertension     Current Outpatient Medications:  .  ALPRAZolam (XANAX) 0.5 MG tablet, TAKE 1 TABLET BY MOUTH TWICE DAILY AS NEEDED FOR SLEEP, Disp: 60 tablet, Rfl: 0 .  aspirin 81 MG tablet, Take 81 mg by mouth daily., Disp: , Rfl:  .  Cyanocobalamin (VITAMIN B-12 PO), Take 1,000 mg by mouth daily., Disp: , Rfl:  .  indomethacin (INDOCIN) 25 MG capsule, TAKE 1 CAPSULE(25 MG) BY MOUTH THREE TIMES DAILY AS NEEDED, Disp: 270 capsule, Rfl: 4 .  indomethacin (INDOCIN) 25 MG capsule, TAKE 1 CAPSULE(25 MG) BY MOUTH THREE TIMES DAILY AS NEEDED, Disp: 270 capsule, Rfl: 1 .  lisinopril (PRINIVIL,ZESTRIL) 20 MG tablet, TAKE 1 TABLET BY MOUTH DAILY, Disp: 90 tablet, Rfl: 1 .  Multiple Vitamins-Minerals (CENTRUM SILVER PO), Take 1 tablet by mouth daily. , Disp: , Rfl:  .  propranolol (INDERAL) 20 MG tablet, TAKE 1 TABLET BY MOUTH TWICE DAILY(THIS REPLACES NADOLOL), Disp: 180 tablet, Rfl: 0 .  sildenafil (REVATIO) 20 MG tablet, TAKE 2 TABLETS BY MOUTH DAILY AS DIRECTED, Disp: 180 tablet, Rfl: 3 .  simvastatin (ZOCOR) 40 MG tablet, TAKE 1 TABLET BY MOUTH EVERY EVENING, Disp: 90  tablet, Rfl: 0 .  tadalafil (CIALIS) 5 MG tablet, Take by mouth., Disp: , Rfl:   Social History   Tobacco Use  Smoking Status Never Smoker  Smokeless Tobacco Never Used    No Known Allergies Objective:   Vitals:   03/14/18 0933  BP: (!) 156/86  Pulse: 61   There is no height or weight on file to calculate BMI. Constitutional Well developed. Well nourished.  Vascular Dorsalis pedis pulses palpable bilaterally. Posterior tibial pulses non-palpable bilaterally. Capillary refill normal to all digits.  No cyanosis or clubbing noted. Varicosities present bilateral feet Pedal hair growth absent.  Neurologic Normal speech. Oriented to person, place, and time. Epicritic sensation to light touch grossly present bilaterally.  Dermatologic Nails elongated x10 bilateral third toenails thickened.  Rams horn toenail left third toe. No open wounds. No skin lesions.  Orthopedic: Normal joint ROM without pain or crepitus bilaterally. No visible deformities. No bony tenderness.   Radiographs: None Assessment:   1. Pain due to onychomycosis of nail   2. Nail dystrophy   3. PAD (peripheral artery disease) ([place])   4. Venous (peripheral) insufficiency   5. Venous stasis    Plan:  Patient was evaluated and treated and all questions answered.  Nail Dystrophy -Educated on etiology.  Discussed that it is more likely traumatic than fungal. -Discussed possible  minor surgery for removal of the left third toenail permanently.  Discussed that since his circulation is slightly diminished would avoid performing procedure at this time. -Nails debrided x10 with nail nipper and rotary bur. -Would consider vascular studies prior to performing procedure.  Procedure: Nail Debridement Rationale: pain Type of Debridement: manual, sharp debridement. Instrumentation: Nail nipper, rotary burr. Number of Nails: 10   Return if symptoms worsen or fail to improve.

## 2018-03-15 ENCOUNTER — Other Ambulatory Visit: Payer: Self-pay | Admitting: Internal Medicine

## 2018-03-26 ENCOUNTER — Other Ambulatory Visit: Payer: Self-pay | Admitting: Internal Medicine

## 2018-04-11 DIAGNOSIS — L821 Other seborrheic keratosis: Secondary | ICD-10-CM | POA: Diagnosis not present

## 2018-04-11 DIAGNOSIS — L111 Transient acantholytic dermatosis [Grover]: Secondary | ICD-10-CM | POA: Diagnosis not present

## 2018-04-11 DIAGNOSIS — Z872 Personal history of diseases of the skin and subcutaneous tissue: Secondary | ICD-10-CM | POA: Diagnosis not present

## 2018-04-11 DIAGNOSIS — D225 Melanocytic nevi of trunk: Secondary | ICD-10-CM | POA: Diagnosis not present

## 2018-04-11 DIAGNOSIS — C44629 Squamous cell carcinoma of skin of left upper limb, including shoulder: Secondary | ICD-10-CM | POA: Diagnosis not present

## 2018-04-11 DIAGNOSIS — Z85820 Personal history of malignant melanoma of skin: Secondary | ICD-10-CM | POA: Diagnosis not present

## 2018-04-11 DIAGNOSIS — L814 Other melanin hyperpigmentation: Secondary | ICD-10-CM | POA: Diagnosis not present

## 2018-04-11 DIAGNOSIS — L570 Actinic keratosis: Secondary | ICD-10-CM | POA: Diagnosis not present

## 2018-04-11 DIAGNOSIS — L578 Other skin changes due to chronic exposure to nonionizing radiation: Secondary | ICD-10-CM | POA: Diagnosis not present

## 2018-04-18 DIAGNOSIS — C44629 Squamous cell carcinoma of skin of left upper limb, including shoulder: Secondary | ICD-10-CM | POA: Diagnosis not present

## 2018-05-05 ENCOUNTER — Other Ambulatory Visit: Payer: Self-pay | Admitting: Internal Medicine

## 2018-05-06 ENCOUNTER — Other Ambulatory Visit: Payer: Self-pay | Admitting: Internal Medicine

## 2018-05-06 NOTE — Telephone Encounter (Signed)
Spoke to pt and set him up with an appt. With Hernanadez. Ok for refill Alprazolam?

## 2018-05-07 NOTE — Telephone Encounter (Signed)
Okay for refill? Please advise 

## 2018-05-07 NOTE — Telephone Encounter (Signed)
ok 

## 2018-05-20 ENCOUNTER — Ambulatory Visit: Payer: Medicare Other

## 2018-05-22 ENCOUNTER — Ambulatory Visit (INDEPENDENT_AMBULATORY_CARE_PROVIDER_SITE_OTHER): Payer: Medicare Other

## 2018-05-22 DIAGNOSIS — Z23 Encounter for immunization: Secondary | ICD-10-CM | POA: Diagnosis not present

## 2018-06-02 ENCOUNTER — Other Ambulatory Visit: Payer: Self-pay | Admitting: Internal Medicine

## 2018-06-06 ENCOUNTER — Other Ambulatory Visit: Payer: Self-pay | Admitting: Family Medicine

## 2018-06-07 NOTE — Telephone Encounter (Signed)
Pt has an appt 06/19/2018  Okay for refill?

## 2018-06-10 NOTE — Telephone Encounter (Signed)
Ok for refill? Please advise 

## 2018-06-10 NOTE — Telephone Encounter (Signed)
Routing to Dr. Hernandez  

## 2018-06-10 NOTE — Telephone Encounter (Signed)
Ok to refill until appointment on 11/27. Will discuss continued refills at that time.

## 2018-06-13 ENCOUNTER — Other Ambulatory Visit: Payer: Self-pay | Admitting: Internal Medicine

## 2018-06-19 ENCOUNTER — Encounter: Payer: Self-pay | Admitting: Internal Medicine

## 2018-06-19 ENCOUNTER — Ambulatory Visit (INDEPENDENT_AMBULATORY_CARE_PROVIDER_SITE_OTHER): Payer: Medicare Other | Admitting: Internal Medicine

## 2018-06-19 VITALS — BP 150/80 | HR 66 | Temp 98.2°F | Wt 215.9 lb

## 2018-06-19 DIAGNOSIS — C439 Malignant melanoma of skin, unspecified: Secondary | ICD-10-CM

## 2018-06-19 DIAGNOSIS — K4090 Unilateral inguinal hernia, without obstruction or gangrene, not specified as recurrent: Secondary | ICD-10-CM

## 2018-06-19 DIAGNOSIS — E7800 Pure hypercholesterolemia, unspecified: Secondary | ICD-10-CM

## 2018-06-19 DIAGNOSIS — N529 Male erectile dysfunction, unspecified: Secondary | ICD-10-CM

## 2018-06-19 DIAGNOSIS — I10 Essential (primary) hypertension: Secondary | ICD-10-CM

## 2018-06-19 DIAGNOSIS — I6529 Occlusion and stenosis of unspecified carotid artery: Secondary | ICD-10-CM | POA: Diagnosis not present

## 2018-06-19 DIAGNOSIS — R519 Headache, unspecified: Secondary | ICD-10-CM

## 2018-06-19 DIAGNOSIS — F411 Generalized anxiety disorder: Secondary | ICD-10-CM | POA: Insufficient documentation

## 2018-06-19 DIAGNOSIS — R51 Headache: Secondary | ICD-10-CM

## 2018-06-19 DIAGNOSIS — R7302 Impaired glucose tolerance (oral): Secondary | ICD-10-CM

## 2018-06-19 MED ORDER — ALPRAZOLAM 0.5 MG PO TABS: 0.5000 mg | ORAL_TABLET | Freq: Two times a day (BID) | ORAL | 0 refills | Status: DC | PRN

## 2018-06-19 MED ORDER — CETIRIZINE HCL 10 MG PO TABS: 10.0000 mg | ORAL_TABLET | Freq: Every day | ORAL | 11 refills | Status: AC

## 2018-06-19 MED ORDER — SILDENAFIL CITRATE 20 MG PO TABS: ORAL_TABLET | ORAL | 1 refills | Status: DC

## 2018-06-19 MED ORDER — LISINOPRIL 20 MG PO TABS: 20.0000 mg | ORAL_TABLET | Freq: Every day | ORAL | 1 refills | Status: DC

## 2018-06-19 NOTE — Progress Notes (Signed)
Established Patient Office Visit     CC/Reason for Visit: Establish care, chronic medical follow-up, sinus pressure  HPI: Randall Mcgee is a 77 y.o. male who is coming in today for the above mentioned reasons.  Due for annual wellness exam in June 2020.  Past Medical History is significant for: Hypertension, hyperlipidemia, erectile dysfunction.  He states that with his wife's breast cancer diagnosis he has ongoing anxiety, has been prescribed Xanax for this.  He takes it once a night and 3 to 4 days of the month will take an additional pill during the day if necessary.  He has been on this for at least 10 years.  He also has a right inguinal hernia that Dr. Raliegh Ip had been observing.  He feels that it is now causing issues with his urine flow and questions whether it may have to do with his sexual dysfunction and would like to see a surgeon about possible repair.  He also complains of significant postnasal drip and sore throat and would like to see if there is anything that might help.   Past Medical/Surgical History: Past Medical History:  Diagnosis Date  . Cancer ([place]) 2017   melanoma on scalp  . History of chicken pox   . Hyperlipidemia   . Hypertension     Past Surgical History:  Procedure Laterality Date  . APPLICATION OF A-CELL OF EXTREMITY N/A 07/13/2016   Procedure: APPLICATION OF A-CELL;  Surgeon: Wallace Going, DO;  Location: [place];  Service: Plastics;  Laterality: N/A;  . APPLICATION OF A-CELL OF HEAD/NECK N/A 06/28/2016   Procedure: APPLICATION OF A-CELL OF HEAD/NECK;  Surgeon: Wallace Going, DO;  Location: [place];  Service: Plastics;  Laterality: N/A;  . APPLICATION OF A-CELL OF HEAD/NECK N/A 07/26/2016   Procedure: APPLICATION OF ACELL OF HEAD;  Surgeon: Wallace Going, DO;  Location: [place];  Service: Plastics;  Laterality: N/A;  . APPLICATION OF A-CELL OF HEAD/NECK N/A 08/31/2016   Procedure:  APPLICATION OF A-CELL OF HEAD/NECK;  Surgeon: Wallace Going, DO;  Location: [place];  Service: Plastics;  Laterality: N/A;  . CHOLECYSTECTOMY    . ENDARTERECTOMY  06/20/11  . ENDARTERECTOMY  06/20/2011   Procedure: ENDARTERECTOMY CAROTID;  Surgeon: Elam Dutch, MD;  Location[facility];  Service: Vascular;  Laterality: Right;  Right Carotid endarterectomy with dacron patch angioplasty   . MASS EXCISION N/A 06/28/2016   Procedure: EXCISION SCALP MELANOMA;  Surgeon: Wallace Going, DO;  Location: [place];  Service: Plastics;  Laterality: N/A;  . MASS EXCISION N/A 07/13/2016   Procedure: RE- EXCISION OF SCALP MELANOMA;  Surgeon: Wallace Going, DO;  Location: [place];  Service: Plastics;  Laterality: N/A;  . MASS EXCISION N/A 07/26/2016   Procedure: RE-EXCISION OF SCALP MELANOMA FOR POSITIVE MARGAIN;  Surgeon: Wallace Going, DO;  Location: [place];  Service: Plastics;  Laterality: N/A;  . SKIN SPLIT GRAFT Left 08/31/2016   Procedure: SKIN GRAFT SPLIT THICKNESS TO HIS SCALP FROM HIS THIGH;  Surgeon: Wallace Going, DO;  Location: [place];  Service: Plastics;  Laterality: Left;  . TONSILLECTOMY    . TONSILLECTOMY      Social History:  reports that he has never smoked. He has never used smokeless tobacco. He reports that he does not drink alcohol or use drugs.  Allergies: No Known Allergies  Family History:  Family History  Problem Relation Age of Onset  . Heart disease Mother      Current Outpatient Medications:  .  ALPRAZolam (XANAX) 0.5 MG tablet, Take 1 tablet (0.5 mg total) by mouth 2 (two) times daily as needed for anxiety., Disp: 20 tablet, Rfl: 0 .  aspirin 81 MG tablet, Take 81 mg by mouth daily., Disp: , Rfl:  .  Cyanocobalamin (VITAMIN B-12 PO), Take 1,000 mg by mouth daily., Disp: , Rfl:  .  indomethacin (INDOCIN) 25 MG capsule, TAKE 1 CAPSULE(25 MG) BY MOUTH THREE  TIMES DAILY AS NEEDED, Disp: 270 capsule, Rfl: 1 .  lisinopril (PRINIVIL,ZESTRIL) 20 MG tablet, Take 1 tablet (20 mg total) by mouth daily., Disp: 90 tablet, Rfl: 1 .  Multiple Vitamins-Minerals (CENTRUM SILVER PO), Take 1 tablet by mouth daily. , Disp: , Rfl:  .  propranolol (INDERAL) 20 MG tablet, TAKE 1 TABLET BY MOUTH TWICE DAILY(THIS REPLACES NADOLOL), Disp: 180 tablet, Rfl: 0 .  sildenafil (REVATIO) 20 MG tablet, TAKE 2 TABLETS BY MOUTH DAILY AS DIRECTED, Disp: 180 tablet, Rfl: 1 .  simvastatin (ZOCOR) 40 MG tablet, TAKE 1 TABLET BY MOUTH EVERY EVENING, Disp: 90 tablet, Rfl: 0 .  cetirizine (ZYRTEC) 10 MG tablet, Take 1 tablet (10 mg total) by mouth daily., Disp: 30 tablet, Rfl: 11  Review of Systems:  Constitutional: Denies fever, chills, diaphoresis, appetite change and fatigue.  HEENT: Denies photophobia, eye pain, redness, hearing loss, ear pain, mouth sores, trouble swallowing, neck pain, neck stiffness and tinnitus.   Respiratory: Denies SOB, DOE, cough, chest tightness,  and wheezing.   Cardiovascular: Denies chest pain, palpitations and leg swelling.  Gastrointestinal: Denies nausea, vomiting, abdominal pain, diarrhea, constipation, blood in stool and abdominal distention.  Genitourinary: Denies dysuria, urgency, frequency, hematuria, flank pain and difficulty urinating.  Endocrine: Denies: hot or cold intolerance, sweats, changes in hair or nails, polyuria, polydipsia. Musculoskeletal: Denies myalgias, back pain, joint swelling, arthralgias and gait problem.  Skin: Denies pallor, rash and wound.  Neurological: Denies dizziness, seizures, syncope, weakness, light-headedness, numbness and headaches.  Hematological: Denies adenopathy. Easy bruising, personal or family bleeding history  Psychiatric/Behavioral: Denies suicidal ideation, mood changes, confusion, nervousness, sleep disturbance and agitation    Physical Exam: Vitals:   06/19/18 1549  BP: (!) 150/80  Pulse: 66    Temp: 98.2 F (36.8 C)  TempSrc: Oral  SpO2: 96%  Weight: 215 lb 14.4 oz (97.9 kg)    Body mass index is 32.83 kg/m.   Constitutional: NAD, calm, comfortable Eyes: PERRL, lids and conjunctivae normal ENMT: Mucous membranes are moist. Posterior pharynx erythematous without exudates.. Normal dentition.  Neck: normal, supple, no masses, no thyromegaly Respiratory: clear to auscultation bilaterally, no wheezing, no crackles. Normal respiratory effort. No accessory muscle use.  Cardiovascular: Regular rate and rhythm, no murmurs / rubs / gallops. No extremity edema. 2+ pedal pulses. No carotid bruits.  Abdomen: no tenderness, no masses palpated. No hepatosplenomegaly. Bowel sounds positive.  Musculoskeletal: no clubbing / cyanosis. No joint deformity upper and lower extremities. Good ROM, no contractures. Normal muscle tone.  Skin: no rashes, lesions, ulcers. No induration Neurologic: Grossly intact and nonfocal Psychiatric: Normal judgment and insight. Alert and oriented x 3. Normal mood.    Impression and Plan:  Essential hypertension - Plan: lisinopril (PRINIVIL,ZESTRIL) 20 MG tablet -Uncontrolled at today's visit. -Patient states he was a little anxious about meeting his new physician today. -Have encouraged ambulatory blood pressure monitoring and to bring log in at next visit. -Will  plan to see him again in 3 months, if BP remains elevated will likely need to adjust antihypertensive regimen at that time.  Melanoma of skin ([place]), Chronic of the scalp -Status post removal by plastic surgery  Hypercholesterolemia -On simvastatin -LDL 93 in June 2019  Right inguinal hernia -He is requesting referral to surgery for further evaluation. -I have explained to him that typically we observe these unless they start to cause symptoms.  He believes that this may have something to do with increased urinary flow and sexual dysfunction. -He will call Dr. Biagio Borg office.  Impaired  glucose tolerance -Fasting glucose in December 2018 was 108. -Counseled on healthy lifestyle.  Erectile dysfunction, unspecified erectile dysfunction type - Plan: sildenafil (REVATIO) 20 MG tablet  Anxiety state - Plan: ALPRAZolam (XANAX) 0.5 MG tablet -We have discussed Xanax use. -Have discussed potential side effects not limited to habit-forming and drug tolerance, sedation. -He states he has been on this medication for over 10 years, uses it at nighttime and only 3-4 times a month will he take an additional tablet during the daytime.  He has been using this to cope with his wife's diagnosis of breast cancer. -We will refill today but continue to discuss at every visit.  Sinus headache - Plan: cetirizine (ZYRTEC) 10 MG tablet    Patient Instructions  -It was nice to meet you!  -Remember to schedule follow-up with the surgeon in regards to your inguinal hernia.  -Take Zyrtec 10 mg daily for sinus pressure and postnasal drip.  -Please schedule follow-up with me in 3 months for continued blood pressure management.     Lelon Frohlich, MD [place] Jacklynn [facility]ong

## 2018-06-19 NOTE — Patient Instructions (Signed)
-  It was nice to meet you!  -Remember to schedule follow-up with the surgeon in regards to your inguinal hernia.  -Take Zyrtec 10 mg daily for sinus pressure and postnasal drip.  -Please schedule follow-up with me in 3 months for continued blood pressure management.

## 2018-07-04 ENCOUNTER — Other Ambulatory Visit: Payer: Self-pay | Admitting: Internal Medicine

## 2018-07-04 DIAGNOSIS — F411 Generalized anxiety disorder: Secondary | ICD-10-CM

## 2018-07-04 NOTE — Telephone Encounter (Signed)
Patient takes his Alprazolam 1 tab in the evening before bed and 1 tab during the day - usually in the mornings.

## 2018-07-04 NOTE — Telephone Encounter (Signed)
Randall Schneiders, do you mind calling him back and asking with what frequency he is taking the ativan? Looks like we gave him 20 tabs on 11/27 and per my note, looks like he was only taking it at bedtime. Thanks.

## 2018-07-10 DIAGNOSIS — K4090 Unilateral inguinal hernia, without obstruction or gangrene, not specified as recurrent: Secondary | ICD-10-CM | POA: Diagnosis not present

## 2018-07-25 ENCOUNTER — Ambulatory Visit: Payer: Self-pay | Admitting: General Surgery

## 2018-08-06 ENCOUNTER — Other Ambulatory Visit: Payer: Self-pay | Admitting: *Deleted

## 2018-08-06 MED ORDER — PROPRANOLOL HCL 20 MG PO TABS: ORAL_TABLET | ORAL | 1 refills | Status: DC

## 2018-08-09 ENCOUNTER — Encounter (HOSPITAL_BASED_OUTPATIENT_CLINIC_OR_DEPARTMENT_OTHER): Payer: Self-pay | Admitting: *Deleted

## 2018-08-09 ENCOUNTER — Other Ambulatory Visit: Payer: Self-pay

## 2018-08-12 ENCOUNTER — Encounter (HOSPITAL_BASED_OUTPATIENT_CLINIC_OR_DEPARTMENT_OTHER)
Admission: RE | Admit: 2018-08-12 | Discharge: 2018-08-12 | Disposition: A | Discharge status: Home / Self Care | Payer: Medicare Other | Source: Ambulatory Visit | Attending: General Surgery | Admitting: General Surgery

## 2018-08-12 DIAGNOSIS — Z01810 Encounter for preprocedural cardiovascular examination: Secondary | ICD-10-CM | POA: Diagnosis not present

## 2018-08-12 NOTE — Progress Notes (Signed)
Pre-Surgery Ensure given to patient with instructions to complete by 0415 DOS.  Surgical scrub also given with instructions for use.  Patient verbalized understanding of instructions.

## 2018-08-16 ENCOUNTER — Ambulatory Visit (HOSPITAL_BASED_OUTPATIENT_CLINIC_OR_DEPARTMENT_OTHER): Payer: Medicare Other | Admitting: Anesthesiology

## 2018-08-16 ENCOUNTER — Other Ambulatory Visit: Payer: Self-pay

## 2018-08-16 ENCOUNTER — Ambulatory Visit (HOSPITAL_BASED_OUTPATIENT_CLINIC_OR_DEPARTMENT_OTHER)
Admission: RE | Admit: 2018-08-16 | Discharge: 2018-08-16 | Disposition: A | Discharge status: Home / Self Care | Payer: Medicare Other | Attending: General Surgery | Admitting: General Surgery

## 2018-08-16 ENCOUNTER — Encounter (HOSPITAL_BASED_OUTPATIENT_CLINIC_OR_DEPARTMENT_OTHER)
Admission: RE | Disposition: A | Discharge status: Home / Self Care | Payer: Self-pay | Source: Home / Self Care | Attending: General Surgery

## 2018-08-16 ENCOUNTER — Encounter (HOSPITAL_BASED_OUTPATIENT_CLINIC_OR_DEPARTMENT_OTHER): Payer: Self-pay | Admitting: *Deleted

## 2018-08-16 DIAGNOSIS — I739 Peripheral vascular disease, unspecified: Secondary | ICD-10-CM | POA: Diagnosis not present

## 2018-08-16 DIAGNOSIS — G8918 Other acute postprocedural pain: Secondary | ICD-10-CM | POA: Diagnosis not present

## 2018-08-16 DIAGNOSIS — E785 Hyperlipidemia, unspecified: Secondary | ICD-10-CM | POA: Insufficient documentation

## 2018-08-16 DIAGNOSIS — F419 Anxiety disorder, unspecified: Secondary | ICD-10-CM | POA: Diagnosis not present

## 2018-08-16 DIAGNOSIS — Z85820 Personal history of malignant melanoma of skin: Secondary | ICD-10-CM | POA: Insufficient documentation

## 2018-08-16 DIAGNOSIS — Z7982 Long term (current) use of aspirin: Secondary | ICD-10-CM | POA: Insufficient documentation

## 2018-08-16 DIAGNOSIS — Z79899 Other long term (current) drug therapy: Secondary | ICD-10-CM | POA: Diagnosis not present

## 2018-08-16 DIAGNOSIS — I10 Essential (primary) hypertension: Secondary | ICD-10-CM | POA: Insufficient documentation

## 2018-08-16 DIAGNOSIS — K4090 Unilateral inguinal hernia, without obstruction or gangrene, not specified as recurrent: Secondary | ICD-10-CM | POA: Insufficient documentation

## 2018-08-16 HISTORY — DX: Unilateral inguinal hernia, without obstruction or gangrene, not specified as recurrent: K40.90

## 2018-08-16 HISTORY — PX: INGUINAL HERNIA REPAIR: SHX194

## 2018-08-16 HISTORY — DX: Anxiety disorder, unspecified: F41.9

## 2018-08-16 SURGERY — REPAIR, HERNIA, INGUINAL, ADULT
Anesthesia: General | Site: Abdomen | Laterality: Right

## 2018-08-16 MED ORDER — EPHEDRINE 5 MG/ML INJ
INTRAVENOUS | Status: AC
  Filled 2018-08-16: qty 10

## 2018-08-16 MED ORDER — ACETAMINOPHEN 500 MG PO TABS
ORAL_TABLET | ORAL | Status: AC
  Filled 2018-08-16: qty 2

## 2018-08-16 MED ORDER — ACETAMINOPHEN 500 MG PO TABS
1000.0000 mg | ORAL_TABLET | ORAL | Status: AC
  Administered 2018-08-16: 1000 mg via ORAL

## 2018-08-16 MED ORDER — CHLORHEXIDINE GLUCONATE CLOTH 2 % EX PADS: 6.0000 | MEDICATED_PAD | Freq: Once | CUTANEOUS | Status: DC

## 2018-08-16 MED ORDER — DEXAMETHASONE SODIUM PHOSPHATE 10 MG/ML IJ SOLN
INTRAMUSCULAR | Status: AC
  Filled 2018-08-16: qty 1

## 2018-08-16 MED ORDER — DEXAMETHASONE SODIUM PHOSPHATE 4 MG/ML IJ SOLN
INTRAMUSCULAR | Status: DC | PRN
  Administered 2018-08-16: 10 mg via INTRAVENOUS

## 2018-08-16 MED ORDER — BUPIVACAINE HCL (PF) 0.25 % IJ SOLN
INTRAMUSCULAR | Status: AC
  Filled 2018-08-16: qty 30

## 2018-08-16 MED ORDER — SCOPOLAMINE 1 MG/3DAYS TD PT72: 1.0000 | MEDICATED_PATCH | Freq: Once | TRANSDERMAL | Status: DC | PRN

## 2018-08-16 MED ORDER — ONDANSETRON HCL 4 MG/2ML IJ SOLN
INTRAMUSCULAR | Status: DC | PRN
  Administered 2018-08-16: 4 mg via INTRAVENOUS

## 2018-08-16 MED ORDER — CEFAZOLIN SODIUM-DEXTROSE 2-4 GM/100ML-% IV SOLN
INTRAVENOUS | Status: AC
  Filled 2018-08-16: qty 100

## 2018-08-16 MED ORDER — PROPOFOL 10 MG/ML IV BOLUS
INTRAVENOUS | Status: DC | PRN
  Administered 2018-08-16: 50 mg via INTRAVENOUS
  Administered 2018-08-16: 100 mg via INTRAVENOUS

## 2018-08-16 MED ORDER — KETOROLAC TROMETHAMINE 30 MG/ML IJ SOLN
INTRAMUSCULAR | Status: AC
  Filled 2018-08-16: qty 1

## 2018-08-16 MED ORDER — CEFAZOLIN SODIUM-DEXTROSE 2-4 GM/100ML-% IV SOLN
2.0000 g | INTRAVENOUS | Status: AC
  Administered 2018-08-16: 2 g via INTRAVENOUS

## 2018-08-16 MED ORDER — FENTANYL CITRATE (PF) 100 MCG/2ML IJ SOLN
50.0000 ug | INTRAMUSCULAR | Status: DC | PRN
  Administered 2018-08-16: 100 ug via INTRAVENOUS

## 2018-08-16 MED ORDER — OXYCODONE HCL 5 MG PO TABS
ORAL_TABLET | ORAL | Status: AC
  Filled 2018-08-16: qty 1

## 2018-08-16 MED ORDER — PROPOFOL 500 MG/50ML IV EMUL
INTRAVENOUS | Status: AC
  Filled 2018-08-16: qty 50

## 2018-08-16 MED ORDER — LIDOCAINE 2% (20 MG/ML) 5 ML SYRINGE
INTRAMUSCULAR | Status: AC
  Filled 2018-08-16: qty 5

## 2018-08-16 MED ORDER — LACTATED RINGERS IV SOLN
INTRAVENOUS | Status: DC
  Administered 2018-08-16 (×2): via INTRAVENOUS

## 2018-08-16 MED ORDER — BUPIVACAINE-EPINEPHRINE (PF) 0.25% -1:200000 IJ SOLN
INTRAMUSCULAR | Status: DC | PRN
  Administered 2018-08-16: 10 mL

## 2018-08-16 MED ORDER — FENTANYL CITRATE (PF) 100 MCG/2ML IJ SOLN
INTRAMUSCULAR | Status: AC
  Filled 2018-08-16: qty 2

## 2018-08-16 MED ORDER — MIDAZOLAM HCL 2 MG/2ML IJ SOLN
INTRAMUSCULAR | Status: AC
  Filled 2018-08-16: qty 2

## 2018-08-16 MED ORDER — ONDANSETRON HCL 4 MG/2ML IJ SOLN: 4.0000 mg | Freq: Once | INTRAMUSCULAR | Status: DC | PRN

## 2018-08-16 MED ORDER — BUPIVACAINE-EPINEPHRINE (PF) 0.5% -1:200000 IJ SOLN
INTRAMUSCULAR | Status: DC | PRN
  Administered 2018-08-16: 30 mL via PERINEURAL

## 2018-08-16 MED ORDER — GABAPENTIN 300 MG PO CAPS
300.0000 mg | ORAL_CAPSULE | ORAL | Status: AC
  Administered 2018-08-16: 300 mg via ORAL

## 2018-08-16 MED ORDER — ONDANSETRON HCL 4 MG/2ML IJ SOLN
INTRAMUSCULAR | Status: AC
  Filled 2018-08-16: qty 2

## 2018-08-16 MED ORDER — EPHEDRINE SULFATE-NACL 50-0.9 MG/10ML-% IV SOSY
PREFILLED_SYRINGE | INTRAVENOUS | Status: DC | PRN
  Administered 2018-08-16 (×3): 10 mg via INTRAVENOUS

## 2018-08-16 MED ORDER — BUPIVACAINE-EPINEPHRINE 0.25% -1:200000 IJ SOLN
INTRAMUSCULAR | Status: AC
  Filled 2018-08-16: qty 1

## 2018-08-16 MED ORDER — FENTANYL CITRATE (PF) 100 MCG/2ML IJ SOLN
25.0000 ug | INTRAMUSCULAR | Status: DC | PRN
  Administered 2018-08-16: 25 ug via INTRAVENOUS

## 2018-08-16 MED ORDER — OXYCODONE HCL 5 MG PO TABS: 5.0000 mg | ORAL_TABLET | Freq: Four times a day (QID) | ORAL | 0 refills | Status: DC | PRN

## 2018-08-16 MED ORDER — GLYCOPYRROLATE PF 0.2 MG/ML IJ SOSY
PREFILLED_SYRINGE | INTRAMUSCULAR | Status: DC | PRN
  Administered 2018-08-16: .2 mg via INTRAVENOUS

## 2018-08-16 MED ORDER — OXYCODONE HCL 5 MG PO TABS
5.0000 mg | ORAL_TABLET | Freq: Once | ORAL | Status: AC
  Administered 2018-08-16: 5 mg via ORAL

## 2018-08-16 MED ORDER — KETOROLAC TROMETHAMINE 30 MG/ML IJ SOLN
INTRAMUSCULAR | Status: DC | PRN
  Administered 2018-08-16: 30 mg via INTRAVENOUS

## 2018-08-16 MED ORDER — GABAPENTIN 300 MG PO CAPS
ORAL_CAPSULE | ORAL | Status: AC
  Filled 2018-08-16: qty 1

## 2018-08-16 MED ORDER — LIDOCAINE HCL (CARDIAC) PF 100 MG/5ML IV SOSY
PREFILLED_SYRINGE | INTRAVENOUS | Status: DC | PRN
  Administered 2018-08-16: 50 mg via INTRAVENOUS

## 2018-08-16 MED ORDER — GLYCOPYRROLATE PF 0.2 MG/ML IJ SOSY
PREFILLED_SYRINGE | INTRAMUSCULAR | Status: AC
  Filled 2018-08-16: qty 1

## 2018-08-16 MED ORDER — MIDAZOLAM HCL 2 MG/2ML IJ SOLN: 1.0000 mg | INTRAMUSCULAR | Status: DC | PRN

## 2018-08-16 SURGICAL SUPPLY — 47 items
BLADE CLIPPER SURG (BLADE) ×3 IMPLANT
BLADE HEX COATED 2.75 (ELECTRODE) ×3 IMPLANT
BLADE SURG 10 STRL SS (BLADE) ×3 IMPLANT
BLADE SURG 15 STRL LF DISP TIS (BLADE) ×1 IMPLANT
BLADE SURG 15 STRL SS (BLADE) ×2
CANISTER SUCT 1200ML W/VALVE (MISCELLANEOUS) ×3 IMPLANT
CHLORAPREP W/TINT 26ML (MISCELLANEOUS) ×3 IMPLANT
COVER BACK TABLE 60X90IN (DRAPES) ×3 IMPLANT
COVER MAYO STAND STRL (DRAPES) ×3 IMPLANT
COVER WAND RF STERILE (DRAPES) IMPLANT
DECANTER SPIKE VIAL GLASS SM (MISCELLANEOUS) ×3 IMPLANT
DERMABOND ADVANCED (GAUZE/BANDAGES/DRESSINGS) ×2
DERMABOND ADVANCED .7 DNX12 (GAUZE/BANDAGES/DRESSINGS) ×1 IMPLANT
DRAIN PENROSE 1/2X12 LTX STRL (WOUND CARE) ×3 IMPLANT
DRAPE LAPAROTOMY 100X72 PEDS (DRAPES) ×3 IMPLANT
DRAPE UTILITY XL STRL (DRAPES) ×3 IMPLANT
ELECT REM PT RETURN 9FT ADLT (ELECTROSURGICAL) ×3
ELECTRODE REM PT RTRN 9FT ADLT (ELECTROSURGICAL) ×1 IMPLANT
GLOVE BIO SURGEON STRL SZ8 (GLOVE) ×3 IMPLANT
GLOVE BIOGEL PI IND STRL 8 (GLOVE) ×1 IMPLANT
GLOVE BIOGEL PI INDICATOR 8 (GLOVE) ×2
GLOVE SURG SYN 8.0 (GLOVE) ×3 IMPLANT
GOWN STRL REIN XL XLG (GOWN DISPOSABLE) ×3 IMPLANT
GOWN STRL REUS W/ TWL LRG LVL3 (GOWN DISPOSABLE) ×1 IMPLANT
GOWN STRL REUS W/ TWL XL LVL3 (GOWN DISPOSABLE) ×1 IMPLANT
GOWN STRL REUS W/TWL LRG LVL3 (GOWN DISPOSABLE) ×2
GOWN STRL REUS W/TWL XL LVL3 (GOWN DISPOSABLE) ×2
MESH HERNIA 3X6 (Mesh General) ×3 IMPLANT
NEEDLE HYPO 25X1 1.5 SAFETY (NEEDLE) ×3 IMPLANT
NS IRRIG 1000ML POUR BTL (IV SOLUTION) ×3 IMPLANT
PACK BASIN DAY SURGERY FS (CUSTOM PROCEDURE TRAY) ×3 IMPLANT
PENCIL BUTTON HOLSTER BLD 10FT (ELECTRODE) ×3 IMPLANT
SLEEVE SCD COMPRESS KNEE MED (MISCELLANEOUS) ×3 IMPLANT
SPONGE LAP 18X18 RF (DISPOSABLE) ×3 IMPLANT
SUT MON AB 4-0 PC3 18 (SUTURE) ×3 IMPLANT
SUT PROLENE 0 CT 2 (SUTURE) ×9 IMPLANT
SUT VIC AB 2-0 SH 27 (SUTURE) ×4
SUT VIC AB 2-0 SH 27XBRD (SUTURE) ×2 IMPLANT
SUT VIC AB 3-0 SH 27 (SUTURE) ×2
SUT VIC AB 3-0 SH 27X BRD (SUTURE) ×1 IMPLANT
SUT VICRYL AB 2 0 TIE (SUTURE) IMPLANT
SUT VICRYL AB 2 0 TIES (SUTURE)
SYR CONTROL 10ML LL (SYRINGE) ×3 IMPLANT
TOWEL GREEN STERILE FF (TOWEL DISPOSABLE) ×6 IMPLANT
TUBE CONNECTING 20'X1/4 (TUBING) ×1
TUBE CONNECTING 20X1/4 (TUBING) ×2 IMPLANT
YANKAUER SUCT BULB TIP NO VENT (SUCTIONS) ×3 IMPLANT

## 2018-08-16 NOTE — Anesthesia Postprocedure Evaluation (Signed)
Anesthesia Post Note  Patient: Teri Diltz Shall  Procedure(s) Performed: RIGHT INGUINAL HERNIA REPAIR WITH MESH (Right Abdomen)     Patient location during evaluation: PACU Anesthesia Type: General Level of consciousness: awake and alert Pain management: pain level controlled Vital Signs Assessment: post-procedure vital signs reviewed and stable Respiratory status: spontaneous breathing, nonlabored ventilation and respiratory function stable Cardiovascular status: blood pressure returned to baseline and stable Postop Assessment: no apparent nausea or vomiting Anesthetic complications: no    Last Vitals:  Vitals:   08/16/18 1030 08/16/18 1032  BP:  136/71  Pulse: 72 69  Resp:  16  Temp:  (!) 36.3 C  SpO2: 97% 96%    Last Pain:  Vitals:   08/16/18 1032  TempSrc:   PainSc: 4                  Catalina Gravel

## 2018-08-16 NOTE — Progress Notes (Signed)
Assisted Dr. Gifford Shave with right, ultrasound guided, transabdominal plane block. Side rails up, monitors on throughout procedure. See vital signs in flow sheet. Tolerated Procedure well.

## 2018-08-16 NOTE — Progress Notes (Signed)
Pt states his pain is around a 6, in spite of tap block and marcaine at site. Fentanyl given per order but pt states it is not helping much.  Sats are dropping to high 80' low 90's. Encouraged deep breathing informed Dr. Gifford Shave. Will give po Oxycodone and watch for 30 minutes on pulse ox. Snack given to prepare to po narcotic

## 2018-08-16 NOTE — Anesthesia Procedure Notes (Signed)
Procedure Name: LMA Insertion Date/Time: 08/16/2018 7:37 AM Performed by: Lyndee Leo, CRNA Pre-anesthesia Checklist: Patient identified, Emergency Drugs available, Suction available and Patient being monitored Patient Re-evaluated:Patient Re-evaluated prior to induction Oxygen Delivery Method: Circle system utilized Preoxygenation: Pre-oxygenation with 100% oxygen Induction Type: IV induction Ventilation: Mask ventilation without difficulty LMA: LMA inserted LMA Size: 5.0 Number of attempts: 1 Airway Equipment and Method: Bite block Placement Confirmation: positive ETCO2 Tube secured with: Tape Dental Injury: Teeth and Oropharynx as per pre-operative assessment

## 2018-08-16 NOTE — H&P (Signed)
Randall Mcgee is an 78 y.o. male.   Chief Complaint: RIH HPI: Presents for right inguinal hernia repair with mesh. No changes since I saw him last.  Past Medical History:  Diagnosis Date  . Anxiety   . Cancer ([place]) 2017   melanoma on scalp  . History of chicken pox   . Hyperlipidemia   . Hypertension   . Right inguinal hernia     Past Surgical History:  Procedure Laterality Date  . APPLICATION OF A-CELL OF EXTREMITY N/A 07/13/2016   Procedure: APPLICATION OF A-CELL;  Surgeon: Wallace Going, DO;  Location: [place];  Service: Plastics;  Laterality: N/A;  . APPLICATION OF A-CELL OF HEAD/NECK N/A 06/28/2016   Procedure: APPLICATION OF A-CELL OF HEAD/NECK;  Surgeon: Wallace Going, DO;  Location: [place];  Service: Plastics;  Laterality: N/A;  . APPLICATION OF A-CELL OF HEAD/NECK N/A 07/26/2016   Procedure: APPLICATION OF ACELL OF HEAD;  Surgeon: Wallace Going, DO;  Location: [place];  Service: Plastics;  Laterality: N/A;  . APPLICATION OF A-CELL OF HEAD/NECK N/A 08/31/2016   Procedure: APPLICATION OF A-CELL OF HEAD/NECK;  Surgeon: Wallace Going, DO;  Location: [place];  Service: Plastics;  Laterality: N/A;  . CHOLECYSTECTOMY    . ENDARTERECTOMY  06/20/11  . ENDARTERECTOMY  06/20/2011   Procedure: ENDARTERECTOMY CAROTID;  Surgeon: Elam Dutch, MD;  Location: [facility] OR;  Service: Vascular;  Laterality: Right;  Right Carotid endarterectomy with dacron patch angioplasty   . MASS EXCISION N/A 06/28/2016   Procedure: EXCISION SCALP MELANOMA;  Surgeon: Wallace Going, DO;  Location: [place];  Service: Plastics;  Laterality: N/A;  . MASS EXCISION N/A 07/13/2016   Procedure: RE- EXCISION OF SCALP MELANOMA;  Surgeon: Wallace Going, DO;  Location: [place];  Service: Plastics;  Laterality: N/A;  . MASS EXCISION N/A 07/26/2016   Procedure: RE-EXCISION OF SCALP  MELANOMA FOR POSITIVE MARGAIN;  Surgeon: Wallace Going, DO;  Location: [place];  Service: Plastics;  Laterality: N/A;  . SKIN SPLIT GRAFT Left 08/31/2016   Procedure: SKIN GRAFT SPLIT THICKNESS TO HIS SCALP FROM HIS THIGH;  Surgeon: Wallace Going, DO;  Location: [place];  Service: Plastics;  Laterality: Left;  . TONSILLECTOMY    . TONSILLECTOMY      Family History  Problem Relation Age of Onset  . Heart disease Mother    Social History:  reports that he has never smoked. He has never used smokeless tobacco. He reports that he does not drink alcohol or use drugs.  Allergies: No Known Allergies  Medications Prior to Admission  Medication Sig Dispense Refill  . ALPRAZolam (XANAX) 0.5 MG tablet TAKE 1 TABLET BY MOUTH TWICE DAILY AS NEEDED FOR SLEEP 60 tablet 2  . aspirin 81 MG tablet Take 81 mg by mouth daily.    . cetirizine (ZYRTEC) 10 MG tablet Take 1 tablet (10 mg total) by mouth daily. 30 tablet 11  . Cyanocobalamin (VITAMIN B-12 PO) Take 1,000 mg by mouth daily.    . indomethacin (INDOCIN) 25 MG capsule TAKE 1 CAPSULE(25 MG) BY MOUTH THREE TIMES DAILY AS NEEDED 270 capsule 1  . lisinopril (PRINIVIL,ZESTRIL) 20 MG tablet Take 1 tablet (20 mg total) by mouth daily. 90 tablet 1  . Multiple Vitamins-Minerals (CENTRUM SILVER PO) Take 1 tablet by mouth daily.     . propranolol (INDERAL) 20 MG tablet  TAKE 1 TABLET BY MOUTH TWICE DAILY(THIS REPLACES NADOLOL) 180 tablet 1  . sildenafil (REVATIO) 20 MG tablet TAKE 2 TABLETS BY MOUTH DAILY AS DIRECTED 180 tablet 1  . simvastatin (ZOCOR) 40 MG tablet TAKE 1 TABLET BY MOUTH EVERY EVENING 90 tablet 0    No results found for this or any previous visit (from the past 48 hour(s)). No results found.  Review of Systems  Unable to perform ROS: Other  Genitourinary: Negative for dysuria.    Blood pressure 140/72, pulse (!) 53, temperature 97.7 F (36.5 C), temperature source Oral, resp. rate (!) 9,  height 5\' 10"  (1.778 m), weight 95.1 kg, SpO2 92 %. Physical Exam  Constitutional: He is oriented to person, place, and time. He appears well-developed and well-nourished.  HENT:  Right Ear: External ear normal.  Left Ear: External ear normal.  Mouth/Throat: Oropharynx is clear and moist.  Eyes: Pupils are equal, round, and reactive to light.  Neck: No tracheal deviation present. No thyromegaly present.  Cardiovascular: Normal rate and regular rhythm.  Respiratory: Effort normal and breath sounds normal.  GI: Soft. He exhibits no distension. There is no abdominal tenderness.  Genitourinary:    Genitourinary Comments: RIH reduces easily   Neurological: He is alert and oriented to person, place, and time.  Skin: Skin is warm and dry.  Psychiatric: He has a normal mood and affect.     Assessment/Plan Right inguinal hernia - for repair right inguinal hernia with mesh. He reveived a TAP block. I again discussed the procedure, risks,and benefits with him and his wife and son. He agrees. Site marked.  Zenovia Jarred, MD 08/16/2018, 7:25 AM

## 2018-08-16 NOTE — Anesthesia Procedure Notes (Signed)
Anesthesia Regional Block: TAP block   Pre-Anesthetic Checklist: ,, timeout performed, Correct Patient, Correct Site, Correct Laterality, Correct Procedure, Correct Position, site marked, Risks and benefits discussed,  Surgical consent,  Pre-op evaluation,  At surgeon's request and post-op pain management  Laterality: Right  Prep: chloraprep       Needles:  Injection technique: Single-shot  Needle Type: Echogenic Needle     Needle Length: 9cm  Needle Gauge: 21     Additional Needles:   Procedures:,,,, ultrasound used (permanent image in chart),,,,  Narrative:  Start time: 08/16/2018 7:06 AM End time: 08/16/2018 7:11 AM Injection made incrementally with aspirations every 5 mL.  Performed by: Personally  Anesthesiologist: Catalina Gravel, MD  Additional Notes: No pain on injection. No increased resistance to injection. Injection made in 5cc increments.  Good needle visualization.  Patient tolerated procedure well.

## 2018-08-16 NOTE — Op Note (Signed)
08/16/2018  8:45 AM  PATIENT:  Randall Mcgee  78 y.o. male  PRE-OPERATIVE DIAGNOSIS:  right inguinal hernia  POST-OPERATIVE DIAGNOSIS:  right inguinal hernia  PROCEDURE:  Procedure(s): RIGHT INGUINAL HERNIA REPAIR WITH MESH  SURGEON:  Surgeon(s): Georganna Skeans, MD  ASSISTANTS: none   ANESTHESIA:   local, regional and general  EBL:  Total I/O In: 1000 [I.V.:1000] Out: -   BLOOD ADMINISTERED:none  DRAINS: none   SPECIMEN:  No Specimen  DISPOSITION OF SPECIMEN:  PATHOLOGY  COUNTS:  YES  DICTATION: .Dragon Dictation Findings: Large direct hernia  Procedure in detail: Randall Mcgee presents for right inguinal hernia repair with mesh.  He was identified in the holding area.  A TAP block was placed by anesthesia.  He received intravenous antibiotics.  Informed consent was obtained.  He was brought to the operating room and general anesthesia with laryngeal mask airway was administered by the anesthesia staff.  His abdomen and groins were prepped and draped in a sterile fashion.  We did a timeout procedure.  Local was injected along the planned line of incision.  Right inguinal incision was made.  Subcutaneous tissues were dissected down through Scarpa's fascia revealing the external oblique which was very attenuated medially due to the hernia.  The lateral portion of the external oblique was divided and the division continued into the attenuated portion.  The superior leaflet was dissected free off the transversalis.  The inferior leaflet was dissected free down to the shelving edge of the inguinal ligament.  Cord structures were encircled with a Penrose drain.  The area was dissected revealing a very large direct hernia sac.  This was dissected free from the cord structures.  There was no indirect hernia sac.  The direct hernia sac was reduced back into the abdomen and I placed several 2-0 Vicryl sutures from the transversalis to the shelving edge of the inguinal ligament to keep it  reduced during our repair.  The repair was then completed with a keyhole polypropylene mesh.  This was secured to the tissues over the pubic tubercle with 0 Prolene.  It was sewed in a running fashion inferiorly to the shelving edge of the inguinal ligament with 0 Prolene.  Superiorly, the mesh was tacked to the tissues over the pubic tubercle and to the transversalis with interrupted 0 Prolene.  The 2 leaflets of the mesh were tucked under the lateral external oblique.  They were then tacked together and to the tissues laterally with 0 Prolene in an interrupted fashion.  The aperture and the mesh was appropriately sized to let the cord passed through.  The area was copiously irrigated.  Hemostasis was ensured.  What was left of the external Bleich was closed with running 2-0 Vicryl.  Scarpa's fascia was closed was closed with interrupted 3-0 Vicryl.  The skin was closed with running 4-0 Monocryl subcuticular followed by Dermabond.  All counts were correct.  There were no apparent complications.  I relocated his right testicle in the scrotum at the completion of the case.  He was taken recovery in stable condition. PATIENT DISPOSITION:  PACU - hemodynamically stable.   Delay start of Pharmacological VTE agent (>24hrs) due to surgical blood loss or risk of bleeding:  no  Georganna Skeans, MD, MPH, FACS Pager: [phone]  1/24/20208:45 AM

## 2018-08-16 NOTE — Discharge Instructions (Signed)
Oxycodone 5mg  given at 0945, next dose due 3:45pm 08/16/2018   Post Anesthesia Home Care Instructions  Activity: Get plenty of rest for the remainder of the day. A responsible individual must stay with you for 24 hours following the procedure.  For the next 24 hours, DO NOT: -Drive a car -Paediatric nurse -Drink alcoholic beverages -Take any medication unless instructed by your physician -Make any legal decisions or sign important papers.  Meals: Start with liquid foods such as gelatin or soup. Progress to regular foods as tolerated. Avoid greasy, spicy, heavy foods. If nausea and/or vomiting occur, drink only clear liquids until the nausea and/or vomiting subsides. Call your physician if vomiting continues. No Ibuprofen products until after 2:30 08/16/2018   Special Instructions/Symptoms: Your throat may feel dry or sore from the anesthesia or the breathing tube placed in your throat during surgery. If this causes discomfort, gargle with warm salt water. The discomfort should disappear within 24 hours.  If you had a scopolamine patch placed behind your ear for the management of post- operative nausea and/or vomiting:  1. The medication in the patch is effective for 72 hours, after which it should be removed.  Wrap patch in a tissue and discard in the trash. Wash hands thoroughly with soap and water. 2. You may remove the patch earlier than 72 hours if you experience unpleasant side effects which may include dry mouth, dizziness or visual disturbances. 3. Avoid touching the patch. Wash your hands with soap and water after contact with the patch.

## 2018-08-16 NOTE — Transfer of Care (Signed)
Immediate Anesthesia Transfer of Care Note  Patient: Randall Mcgee  Procedure(s) Performed: RIGHT INGUINAL HERNIA REPAIR WITH MESH (Right Abdomen)  Patient Location: PACU  Anesthesia Type:GA combined with regional for post-op pain  Level of Consciousness: sedated and patient cooperative  Airway & Oxygen Therapy: Patient Spontanous Breathing and Patient connected to face mask oxygen  Post-op Assessment: Report given to RN and Post -op Vital signs reviewed and stable  Post vital signs: Reviewed and stable  Last Vitals:  Vitals Value Taken Time  BP 118/70 08/16/2018  8:52 AM  Temp    Pulse 64 08/16/2018  8:53 AM  Resp 12 08/16/2018  8:53 AM  SpO2 98 % 08/16/2018  8:53 AM  Vitals shown include unvalidated device data.  Last Pain:  Vitals:   08/16/18 0640  TempSrc: Oral  PainSc: 0-No pain         Complications: No apparent anesthesia complications

## 2018-08-16 NOTE — Anesthesia Preprocedure Evaluation (Signed)
Anesthesia Evaluation  Patient identified by MRN, date of birth, ID band Patient awake    Reviewed: Allergy & Precautions, NPO status , Patient's Chart, lab work & pertinent test results, reviewed documented beta blocker date and time   Airway Mallampati: II  TM Distance: >3 FB Neck ROM: Full    Dental  (+) Dental Advisory Given, Lower Dentures, Upper Dentures   Pulmonary neg pulmonary ROS,    Pulmonary exam normal breath sounds clear to auscultation       Cardiovascular hypertension, Pt. on home beta blockers and Pt. on medications + Peripheral Vascular Disease (Hx/o carotid stenosis S/P carotid endarterectomy)  Normal cardiovascular exam Rhythm:Regular Rate:Normal     Neuro/Psych PSYCHIATRIC DISORDERS Anxiety negative neurological ROS     GI/Hepatic Neg liver ROS, RIH   Endo/Other  Obesity   Renal/GU negative Renal ROS     Musculoskeletal negative musculoskeletal ROS (+)   Abdominal   Peds  Hematology negative hematology ROS (+)   Anesthesia Other Findings Day of surgery medications reviewed with the patient.  Reproductive/Obstetrics                             Anesthesia Physical Anesthesia Plan  ASA: II  Anesthesia Plan: General   Post-op Pain Management:    Induction: Intravenous  PONV Risk Score and Plan: 3 and Dexamethasone, Ondansetron and Treatment may vary due to age or medical condition  Airway Management Planned: Oral ETT  Additional Equipment:   Intra-op Plan:   Post-operative Plan: Extubation in OR  Informed Consent: I have reviewed the patients History and Physical, chart, labs and discussed the procedure including the risks, benefits and alternatives for the proposed anesthesia with the patient or authorized representative who has indicated his/her understanding and acceptance.     Dental advisory given  Plan Discussed with: CRNA  Anesthesia Plan  Comments:         Anesthesia Quick Evaluation

## 2018-08-16 NOTE — Interval H&P Note (Signed)
History and Physical Interval Note:  08/16/2018 7:28 AM  Randall Mcgee  has presented today for surgery, with the diagnosis of right inguinal hernia  The various methods of treatment have been discussed with the patient and family. After consideration of risks, benefits and other options for treatment, the patient has consented to  Procedure(s): RIGHT INGUINAL HERNIA REPAIR WITH MESH (Right) as a surgical intervention .  The patient's history has been reviewed, patient examined, no change in status, stable for surgery.  I have reviewed the patient's chart and labs.  Questions were answered to the patient's satisfaction.     Zenovia Jarred

## 2018-08-19 ENCOUNTER — Encounter (HOSPITAL_BASED_OUTPATIENT_CLINIC_OR_DEPARTMENT_OTHER): Payer: Self-pay | Admitting: General Surgery

## 2018-09-17 ENCOUNTER — Ambulatory Visit: Payer: Medicare Other | Admitting: Podiatry

## 2018-09-17 ENCOUNTER — Other Ambulatory Visit: Payer: Self-pay | Admitting: *Deleted

## 2018-09-17 DIAGNOSIS — B351 Tinea unguium: Secondary | ICD-10-CM

## 2018-09-17 DIAGNOSIS — M79609 Pain in unspecified limb: Secondary | ICD-10-CM

## 2018-09-17 DIAGNOSIS — L84 Corns and callosities: Secondary | ICD-10-CM

## 2018-09-17 DIAGNOSIS — I739 Peripheral vascular disease, unspecified: Secondary | ICD-10-CM | POA: Diagnosis not present

## 2018-09-17 MED ORDER — SIMVASTATIN 40 MG PO TABS: 40.0000 mg | ORAL_TABLET | Freq: Every evening | ORAL | 1 refills | Status: DC

## 2018-09-17 NOTE — Patient Instructions (Signed)
Onychomycosis/Fungal Toenails  WHAT IS IT? An infection that lies within the keratin of your nail plate that is caused by a fungus.  WHY ME? Fungal infections affect all ages, sexes, races, and creeds.  There may be many factors that predispose you to a fungal infection such as age, coexisting medical conditions such as diabetes, or an autoimmune disease; stress, medications, fatigue, genetics, etc.  Bottom line: fungus thrives in a warm, moist environment and your shoes offer such a location.  IS IT CONTAGIOUS? Theoretically, yes.  You do not want to share shoes, nail clippers or files with someone who has fungal toenails.  Walking around barefoot in the same room or sleeping in the same bed is unlikely to transfer the organism.  It is important to realize, however, that fungus can spread easily from one nail to the next on the same foot.  HOW DO WE TREAT THIS?  There are several ways to treat this condition.  Treatment may depend on many factors such as age, medications, pregnancy, liver and kidney conditions, etc.  It is best to ask your doctor which options are available to you.  1. No treatment.   Unlike many other medical concerns, you can live with this condition.  However for many people this can be a painful condition and may lead to ingrown toenails or a bacterial infection.  It is recommended that you keep the nails cut short to help reduce the amount of fungal nail. 2. Topical treatment.  These range from herbal remedies to prescription strength nail lacquers.  About 40-50% effective, topicals require twice daily application for approximately 9 to 12 months or until an entirely new nail has grown out.  The most effective topicals are medical grade medications available through physicians offices. 3. Oral antifungal medications.  With an 80-90% cure rate, the most common oral medication requires 3 to 4 months of therapy and stays in your system for a year as the new nail grows out.  Oral  antifungal medications do require blood work to make sure it is a safe drug for you.  A liver function panel will be performed prior to starting the medication and after the first month of treatment.  It is important to have the blood work performed to avoid any harmful side effects.  In general, this medication safe but blood work is required. 4. Laser Therapy.  This treatment is performed by applying a specialized laser to the affected nail plate.  This therapy is noninvasive, fast, and non-painful.  It is not covered by insurance and is therefore, out of pocket.  The results have been very good with a 80-95% cure rate.  [facility] is the only practice in the area to offer this therapy. 5. Permanent Nail Avulsion.  Removing the entire nail so that a new nail will not grow back.  Corns and Calluses Corns are small areas of thickened skin that occur on the top, sides, or tip of a toe. They contain a cone-shaped core with a point that can press on a nerve below. This causes pain.  Calluses are areas of thickened skin that can occur anywhere on the body, including the hands, fingers, palms, soles of the feet, and heels. Calluses are usually larger than corns. What are the causes? Corns and calluses are caused by rubbing (friction) or pressure, such as from shoes that are too tight or do not fit properly. What increases the risk? Corns are more likely to develop in people   who have misshapen toes (toe deformities), such as hammer toes. Calluses can occur with friction to any area of the skin. They are more likely to develop in people who:  Work with their hands.  Wear shoes that fit poorly, are too tight, or are high-heeled.  Have toe deformities. What are the signs or symptoms? Symptoms of a corn or callus include:  A hard growth on the skin.  Pain or tenderness under the skin.  Redness and swelling.  Increased discomfort while wearing tight-fitting shoes, if your feet are  affected. If a corn or callus becomes infected, symptoms may include:  Redness and swelling that gets worse.  Pain.  Fluid, blood, or pus draining from the corn or callus. How is this diagnosed? Corns and calluses may be diagnosed based on your symptoms, your medical history, and a physical exam. How is this treated? Treatment for corns and calluses may include:  Removing the cause of the friction or pressure. This may involve: ? Changing your shoes. ? Wearing shoe inserts (orthotics) or other protective layers in your shoes, such as a corn pad. ? Wearing gloves.  Applying medicine to the skin (topical medicine) to help soften skin in the hardened, thickened areas.  Removing layers of dead skin with a file to reduce the size of the corn or callus.  Removing the corn or callus with a scalpel or laser.  Taking antibiotic medicines, if your corn or callus is infected.  Having surgery, if a toe deformity is the cause. Follow these instructions at home:   Take over-the-counter and prescription medicines only as told by your health care provider.  If you were prescribed an antibiotic, take it as told by your health care provider. Do not stop taking it even if your condition starts to improve.  Wear shoes that fit well. Avoid wearing high-heeled shoes and shoes that are too tight or too loose.  Wear any padding, protective layers, gloves, or orthotics as told by your health care provider.  Soak your hands or feet and then use a file or pumice stone to soften your corn or callus. Do this as told by your health care provider.  Check your corn or callus every day for symptoms of infection. Contact a health care provider if you:  Notice that your symptoms do not improve with treatment.  Have redness or swelling that gets worse.  Notice that your corn or callus becomes painful.  Have fluid, blood, or pus coming from your corn or callus.  Have new symptoms. Summary  Corns are  small areas of thickened skin that occur on the top, sides, or tip of a toe.  Calluses are areas of thickened skin that can occur anywhere on the body, including the hands, fingers, palms, and soles of the feet. Calluses are usually larger than corns.  Corns and calluses are caused by rubbing (friction) or pressure, such as from shoes that are too tight or do not fit properly.  Treatment may include wearing any padding, protective layers, gloves, or orthotics as told by your health care provider. This information is not intended to replace advice given to you by your health care provider. Make sure you discuss any questions you have with your health care provider. Document Released: 04/15/2004 Document Revised: 05/23/2017 Document Reviewed: 05/23/2017 Elsevier Interactive Patient Education  2019 [employer]  

## 2018-09-19 ENCOUNTER — Encounter: Payer: Self-pay | Admitting: Internal Medicine

## 2018-09-19 ENCOUNTER — Ambulatory Visit (INDEPENDENT_AMBULATORY_CARE_PROVIDER_SITE_OTHER): Payer: Medicare Other | Admitting: Internal Medicine

## 2018-09-19 VITALS — BP 150/80 | HR 93 | Temp 98.3°F | Ht 70.0 in | Wt 210.6 lb

## 2018-09-19 DIAGNOSIS — I10 Essential (primary) hypertension: Secondary | ICD-10-CM | POA: Diagnosis not present

## 2018-09-19 DIAGNOSIS — K4090 Unilateral inguinal hernia, without obstruction or gangrene, not specified as recurrent: Secondary | ICD-10-CM | POA: Diagnosis not present

## 2018-09-19 MED ORDER — LISINOPRIL 40 MG PO TABS: 40.0000 mg | ORAL_TABLET | Freq: Every day | ORAL | 3 refills | Status: DC

## 2018-09-19 NOTE — Patient Instructions (Signed)
-  Nice seeing you today!  -Increase lisinopril dose from 20 to 40 mg daily. New prescription has been sent.  -Schedule follow up in 6-8 weeks.

## 2018-09-19 NOTE — Progress Notes (Signed)
Established Patient Office Visit     CC/Reason for Visit: BP follow up  HPI: Randall Mcgee is a 78 y.o. male who is coming in today for the above mentioned reasons. He was seen in November and noted to have a BP of 150/80. Asked to do ambulatory BP monitoring and returns today for follow up. BP again today is 150/80 in office. Measurements from home average around 140/80, with lower range of 741 systolic and higher end of around 160. He has no acute complaints today. He had his right inguinal hernia repaired by Dr. Grandville Silos about 6 weeks ago and has done really well since.   Past Medical/Surgical History: Past Medical History:  Diagnosis Date  . Anxiety   . Cancer ([place]) 2017   melanoma on scalp  . History of chicken pox   . Hyperlipidemia   . Hypertension   . Right inguinal hernia     Past Surgical History:  Procedure Laterality Date  . APPLICATION OF A-CELL OF EXTREMITY N/A 07/13/2016   Procedure: APPLICATION OF A-CELL;  Surgeon: Wallace Going, DO;  Location: [place];  Service: Plastics;  Laterality: N/A;  . APPLICATION OF A-CELL OF HEAD/NECK N/A 06/28/2016   Procedure: APPLICATION OF A-CELL OF HEAD/NECK;  Surgeon: Wallace Going, DO;  Location: [place];  Service: Plastics;  Laterality: N/A;  . APPLICATION OF A-CELL OF HEAD/NECK N/A 07/26/2016   Procedure: APPLICATION OF ACELL OF HEAD;  Surgeon: Wallace Going, DO;  Location: [place];  Service: Plastics;  Laterality: N/A;  . APPLICATION OF A-CELL OF HEAD/NECK N/A 08/31/2016   Procedure: APPLICATION OF A-CELL OF HEAD/NECK;  Surgeon: Wallace Going, DO;  Location: [place];  Service: Plastics;  Laterality: N/A;  . CHOLECYSTECTOMY    . ENDARTERECTOMY  06/20/11  . ENDARTERECTOMY  06/20/2011   Procedure: ENDARTERECTOMY CAROTID;  Surgeon: Elam Dutch, MD;  Location[facility];  Service: Vascular;  Laterality: Right;  Right Carotid  endarterectomy with dacron patch angioplasty   . INGUINAL HERNIA REPAIR Right 08/16/2018   Procedure: RIGHT INGUINAL HERNIA REPAIR WITH MESH;  Surgeon: Georganna Skeans, MD;  Location: [place];  Service: General;  Laterality: Right;  . MASS EXCISION N/A 06/28/2016   Procedure: EXCISION SCALP MELANOMA;  Surgeon: Wallace Going, DO;  Location: [place];  Service: Plastics;  Laterality: N/A;  . MASS EXCISION N/A 07/13/2016   Procedure: RE- EXCISION OF SCALP MELANOMA;  Surgeon: Wallace Going, DO;  Location: [place];  Service: Plastics;  Laterality: N/A;  . MASS EXCISION N/A 07/26/2016   Procedure: RE-EXCISION OF SCALP MELANOMA FOR POSITIVE MARGAIN;  Surgeon: Wallace Going, DO;  Location: [place];  Service: Plastics;  Laterality: N/A;  . SKIN SPLIT GRAFT Left 08/31/2016   Procedure: SKIN GRAFT SPLIT THICKNESS TO HIS SCALP FROM HIS THIGH;  Surgeon: Wallace Going, DO;  Location: [place];  Service: Plastics;  Laterality: Left;  . TONSILLECTOMY    . TONSILLECTOMY      Social History:  reports that he has never smoked. He has never used smokeless tobacco. He reports that he does not drink alcohol or use drugs.  Allergies: No Known Allergies  Family History:  Family History  Problem Relation Age of Onset  . Heart disease Mother      Current Outpatient Medications:  .  ALPRAZolam (XANAX) 0.5 MG tablet, TAKE 1 TABLET  BY MOUTH TWICE DAILY AS NEEDED FOR SLEEP, Disp: 60 tablet, Rfl: 2 .  aspirin 81 MG tablet, Take 81 mg by mouth daily., Disp: , Rfl:  .  cetirizine (ZYRTEC) 10 MG tablet, Take 1 tablet (10 mg total) by mouth daily., Disp: 30 tablet, Rfl: 11 .  Cyanocobalamin (VITAMIN B-12 PO), Take 1,000 mg by mouth daily., Disp: , Rfl:  .  indomethacin (INDOCIN) 25 MG capsule, TAKE 1 CAPSULE(25 MG) BY MOUTH THREE TIMES DAILY AS NEEDED, Disp: 270 capsule, Rfl: 1 .  Multiple Vitamins-Minerals  (CENTRUM SILVER PO), Take 1 tablet by mouth daily. , Disp: , Rfl:  .  propranolol (INDERAL) 20 MG tablet, TAKE 1 TABLET BY MOUTH TWICE DAILY(THIS REPLACES NADOLOL), Disp: 180 tablet, Rfl: 1 .  sildenafil (REVATIO) 20 MG tablet, TAKE 2 TABLETS BY MOUTH DAILY AS DIRECTED, Disp: 180 tablet, Rfl: 1 .  simvastatin (ZOCOR) 40 MG tablet, Take 1 tablet (40 mg total) by mouth every evening., Disp: 90 tablet, Rfl: 1 .  lisinopril (PRINIVIL,ZESTRIL) 40 MG tablet, Take 1 tablet (40 mg total) by mouth daily., Disp: 90 tablet, Rfl: 3  Review of Systems:  Constitutional: Denies fever, chills, diaphoresis, appetite change and fatigue.  HEENT: Denies photophobia, eye pain, redness, hearing loss, ear pain, congestion, sore throat, rhinorrhea, sneezing, mouth sores, trouble swallowing, neck pain, neck stiffness and tinnitus.   Respiratory: Denies SOB, DOE, cough, chest tightness,  and wheezing.   Cardiovascular: Denies chest pain, palpitations and leg swelling.  Gastrointestinal: Denies nausea, vomiting, abdominal pain, diarrhea, constipation, blood in stool and abdominal distention.  Genitourinary: Denies dysuria, urgency, frequency, hematuria, flank pain and difficulty urinating.  Endocrine: Denies: hot or cold intolerance, sweats, changes in hair or nails, polyuria, polydipsia. Musculoskeletal: Denies myalgias, back pain, joint swelling, arthralgias and gait problem.  Skin: Denies pallor, rash and wound.  Neurological: Denies dizziness, seizures, syncope, weakness, light-headedness, numbness and headaches.  Hematological: Denies adenopathy. Easy bruising, personal or family bleeding history  Psychiatric/Behavioral: Denies suicidal ideation, mood changes, confusion, nervousness, sleep disturbance and agitation    Physical Exam: Vitals:   09/19/18 1439  BP: (!) 150/80  Pulse: 93  Temp: 98.3 F (36.8 C)  TempSrc: Oral  SpO2: 97%  Weight: 210 lb 9.6 oz (95.5 kg)  Height: 5\' 10"  (1.778 m)    Body mass  index is 30.22 kg/m.   Constitutional: NAD, calm, comfortable Eyes: PERRL, lids and conjunctivae normal ENMT: Mucous membranes are moist.  Respiratory: clear to auscultation bilaterally, no wheezing, no crackles. Normal respiratory effort. No accessory muscle use.  Cardiovascular: Regular rate and rhythm, no murmurs / rubs / gallops. No extremity edema. 2+ pedal pulses. No carotid bruits.  Musculoskeletal: no clubbing / cyanosis. No joint deformity upper and lower extremities. Good ROM, no contractures. Normal muscle tone.  Psychiatric: Normal judgment and insight. Alert and oriented x 3. Normal mood.    Impression and Plan:  Essential hypertension  -Increase lisinopril from 20 to 40 mg daily. -RTC in 6-8 weeks for follow up.  Right inguinal hernia -S/p recent repair. No complications.    Patient Instructions  -Nice seeing you today!  -Increase lisinopril dose from 20 to 40 mg daily. New prescription has been sent.  -Schedule follow up in 6-8 weeks.     Lelon Frohlich, MD [facility] at [facility]

## 2018-09-21 ENCOUNTER — Encounter: Payer: Self-pay | Admitting: Podiatry

## 2018-09-21 NOTE — Progress Notes (Signed)
Subjective: Randall Mcgee is a 78 y.o. y.o. male who presents for preventative foot care today with  H/o PAD for follow up of painful mycotic toenails b/l which interfere with daily activities. Pain is aggravated when wearing enclosed shoe gear. Pain is relieved with periodic professional debridement.  Isaac Bliss, Rayford Halsted, MD is his PCP and last visit was    Current Outpatient Medications:  .  ALPRAZolam (XANAX) 0.5 MG tablet, TAKE 1 TABLET BY MOUTH TWICE DAILY AS NEEDED FOR SLEEP, Disp: 60 tablet, Rfl: 2 .  aspirin 81 MG tablet, Take 81 mg by mouth daily., Disp: , Rfl:  .  cetirizine (ZYRTEC) 10 MG tablet, Take 1 tablet (10 mg total) by mouth daily., Disp: 30 tablet, Rfl: 11 .  Cyanocobalamin (VITAMIN B-12 PO), Take 1,000 mg by mouth daily., Disp: , Rfl:  .  indomethacin (INDOCIN) 25 MG capsule, TAKE 1 CAPSULE(25 MG) BY MOUTH THREE TIMES DAILY AS NEEDED, Disp: 270 capsule, Rfl: 1 .  Multiple Vitamins-Minerals (CENTRUM SILVER PO), Take 1 tablet by mouth daily. , Disp: , Rfl:  .  propranolol (INDERAL) 20 MG tablet, TAKE 1 TABLET BY MOUTH TWICE DAILY(THIS REPLACES NADOLOL), Disp: 180 tablet, Rfl: 1 .  sildenafil (REVATIO) 20 MG tablet, TAKE 2 TABLETS BY MOUTH DAILY AS DIRECTED, Disp: 180 tablet, Rfl: 1 .  lisinopril (PRINIVIL,ZESTRIL) 40 MG tablet, Take 1 tablet (40 mg total) by mouth daily., Disp: 90 tablet, Rfl: 3 .  simvastatin (ZOCOR) 40 MG tablet, Take 1 tablet (40 mg total) by mouth every evening., Disp: 90 tablet, Rfl: 1  No Known Allergies  Objective: Vascular Examination: Capillary refill time immediate x 10 digits  Dorsalis pedis pulses 1/4 b/l  Posterior tibial pulses absent b/l  No digital hair x 10 digits   Skin temperature WNL b/l  Dermatological Examination: Skin with normal turgor, texture and tone b/l.  Toenails 1-5 b/l discolored, thick, dystrophic with subungual debris and pain with palpation to nailbeds due to thickness of nails.  Hyperkeratotic lesion  noted distal tip b/l 3rd digit submetatarsal head 5 right foot. No erythema, no edema, no drainage, no flocculence.  Musculoskeletal: Muscle strength 5/5 to all LE muscle groups  Neurological: Sensation intact with 10 gram monofilament b/l  Vibratory sensation intact b/l  Assessment: 1. Painful onychomycosis toenails 1-5 b/l 2. Calluses submet head 5 right foot 3. Corns distal tip 3rd digits b/l 4. Peripheral arterial disease  Plan: 1. Discuss diabetic foot care principles. Literature dispensed. 2. Toenails 1-5 b/l were debrided in length and girth without iatrogenic bleeding. 3. Hyperkeratotic lesion(s)  pared with sterile scalpel blade and gently smoothed with burr distal tip b/l 3rd digit submetatarsal head 5 right foot. 4. Patient to continue soft, supportive shoe gear 5. Patient to report any pedal injuries to medical professional  6. Follow up 3 months.  7. Patient/POA to call should there be a concern in the interim.

## 2018-10-11 ENCOUNTER — Encounter: Payer: Self-pay | Admitting: Internal Medicine

## 2018-10-11 DIAGNOSIS — C44311 Basal cell carcinoma of skin of nose: Secondary | ICD-10-CM | POA: Diagnosis not present

## 2018-10-11 DIAGNOSIS — D225 Melanocytic nevi of trunk: Secondary | ICD-10-CM | POA: Diagnosis not present

## 2018-10-11 DIAGNOSIS — D1801 Hemangioma of skin and subcutaneous tissue: Secondary | ICD-10-CM | POA: Diagnosis not present

## 2018-10-11 DIAGNOSIS — L570 Actinic keratosis: Secondary | ICD-10-CM | POA: Diagnosis not present

## 2018-10-11 DIAGNOSIS — L821 Other seborrheic keratosis: Secondary | ICD-10-CM | POA: Diagnosis not present

## 2018-10-11 DIAGNOSIS — C44319 Basal cell carcinoma of skin of other parts of face: Secondary | ICD-10-CM | POA: Diagnosis not present

## 2018-10-11 DIAGNOSIS — L578 Other skin changes due to chronic exposure to nonionizing radiation: Secondary | ICD-10-CM | POA: Diagnosis not present

## 2018-10-21 ENCOUNTER — Telehealth: Payer: Self-pay | Admitting: *Deleted

## 2018-10-21 NOTE — Telephone Encounter (Signed)
Patient called back to decline appointment for a web visit. He would rather come in on 10/29/2018

## 2018-10-21 NOTE — Telephone Encounter (Signed)
Left message on machine for patient to schedule a WebEx appointment. CRM

## 2018-10-22 NOTE — Telephone Encounter (Signed)
Dr Jerilee Hoh is available for a phone visit and the patient is aware.

## 2018-10-29 ENCOUNTER — Other Ambulatory Visit: Payer: Self-pay

## 2018-10-29 ENCOUNTER — Ambulatory Visit (INDEPENDENT_AMBULATORY_CARE_PROVIDER_SITE_OTHER): Payer: Medicare Other | Admitting: Internal Medicine

## 2018-10-29 DIAGNOSIS — I10 Essential (primary) hypertension: Secondary | ICD-10-CM | POA: Diagnosis not present

## 2018-10-29 DIAGNOSIS — R05 Cough: Secondary | ICD-10-CM

## 2018-10-29 DIAGNOSIS — R059 Cough, unspecified: Secondary | ICD-10-CM

## 2018-10-29 MED ORDER — BENZONATATE 100 MG PO CAPS: 100.0000 mg | ORAL_CAPSULE | Freq: Two times a day (BID) | ORAL | 0 refills | Status: DC | PRN

## 2018-10-29 NOTE — Progress Notes (Signed)
Virtual Visit via Telephone Note  I connected with Randall Mcgee on 10/29/18 at 10:00 AM EDT by telephone and verified that I am speaking with the correct person using two identifiers.   I discussed the limitations, risks, security and privacy concerns of performing an evaluation and management service by telephone and the availability of in person appointments. I also discussed with the patient that there may be a patient responsible charge related to this service. The patient expressed understanding and agreed to proceed.  We attempted video chat, but were unable to connect due to difficulties on the patient's end, so converted it to a phone visit.  Location patient: home Location provider: work office Participants present for the call: patient, provider Patient did not have a visit in the prior 7 days to address this/these issue(s).   History of Present Illness:  This visit is to follow up on BP. His lisinopril was increased from 20 to 40 mg 6 weeks due to persistently elevated BP. He has been monitoring his BP at home: 130-135/67-78.  He has been having increased sinus drainage. Has been taking cetirizine with some relief. Main issue is cough is keeping him up at night. No fever, SOB, no recent travel or sick contacts.   Observations/Objective: Patient sounds cheerful and well on the phone. I do not appreciate any increased work of breathing. Speech and thought processing are grossly intact. Patient reported vitals: BP 130-135/67-78   Current Outpatient Medications:  .  ALPRAZolam (XANAX) 0.5 MG tablet, TAKE 1 TABLET BY MOUTH TWICE DAILY AS NEEDED FOR SLEEP, Disp: 60 tablet, Rfl: 2 .  aspirin 81 MG tablet, Take 81 mg by mouth daily., Disp: , Rfl:  .  benzonatate (TESSALON) 100 MG capsule, Take 1 capsule (100 mg total) by mouth 2 (two) times daily as needed for cough., Disp: 20 capsule, Rfl: 0 .  cetirizine (ZYRTEC) 10 MG tablet, Take 1 tablet (10 mg total) by mouth daily.,  Disp: 30 tablet, Rfl: 11 .  Cyanocobalamin (VITAMIN B-12 PO), Take 1,000 mg by mouth daily., Disp: , Rfl:  .  indomethacin (INDOCIN) 25 MG capsule, TAKE 1 CAPSULE(25 MG) BY MOUTH THREE TIMES DAILY AS NEEDED, Disp: 270 capsule, Rfl: 1 .  lisinopril (PRINIVIL,ZESTRIL) 40 MG tablet, Take 1 tablet (40 mg total) by mouth daily., Disp: 90 tablet, Rfl: 3 .  Multiple Vitamins-Minerals (CENTRUM SILVER PO), Take 1 tablet by mouth daily. , Disp: , Rfl:  .  propranolol (INDERAL) 20 MG tablet, TAKE 1 TABLET BY MOUTH TWICE DAILY(THIS REPLACES NADOLOL), Disp: 180 tablet, Rfl: 1 .  sildenafil (REVATIO) 20 MG tablet, TAKE 2 TABLETS BY MOUTH DAILY AS DIRECTED, Disp: 180 tablet, Rfl: 1 .  simvastatin (ZOCOR) 40 MG tablet, Take 1 tablet (40 mg total) by mouth every evening., Disp: 90 tablet, Rfl: 1  Review of Systems:  Constitutional: Denies fever, chills, diaphoresis, appetite change and fatigue.  HEENT: Denies photophobia, eye pain, redness, hearing loss, ear pain, mouth sores, trouble swallowing, neck pain, neck stiffness and tinnitus.   Respiratory: Denies SOB, DOE, chest tightness,  and wheezing.   Cardiovascular: Denies chest pain, palpitations and leg swelling.  Gastrointestinal: Denies nausea, vomiting, abdominal pain, diarrhea, constipation, blood in stool and abdominal distention.  Genitourinary: Denies dysuria, urgency, frequency, hematuria, flank pain and difficulty urinating.  Endocrine: Denies: hot or cold intolerance, sweats, changes in hair or nails, polyuria, polydipsia. Musculoskeletal: Denies myalgias, back pain, joint swelling, arthralgias and gait problem.  Skin: Denies pallor, rash and wound.  Neurological: Denies dizziness, seizures, syncope, weakness, light-headedness, numbness and headaches.  Hematological: Denies adenopathy. Easy bruising, personal or family bleeding history  Psychiatric/Behavioral: Denies suicidal ideation, mood changes, confusion, nervousness, sleep disturbance and  agitation   Assessment and Plan:  Essential hypertension -Well controlled. -Continue current meds. -Check BMET at next in-person visit.  Cough  -Given limited exam findings, PNA, pharyngitis, ear infection are not likely, hence abx have not been prescribed. -Have advised rest, fluids, OTC antihistamines, cough suppressants and mucinex. -Have sent in a Rx for tessalon perles for cough. -No symptoms concerning for COVID-19.     I discussed the assessment and treatment plan with the patient. The patient was provided an opportunity to ask questions and all were answered. The patient agreed with the plan and demonstrated an understanding of the instructions.   The patient was advised to call back or seek an in-person evaluation if the symptoms worsen or if the condition fails to improve as anticipated.  I provided 18 minutes of non-face-to-face time during this encounter.   Lelon Frohlich, MD [facility] at [facility]

## 2018-11-05 ENCOUNTER — Ambulatory Visit: Payer: Medicare Other | Admitting: Internal Medicine

## 2018-11-11 ENCOUNTER — Telehealth: Payer: Self-pay | Admitting: *Deleted

## 2018-11-11 NOTE — Telephone Encounter (Signed)
Left message on machine for patient to schedule a follow up appointment for hypertension. CRM

## 2018-11-14 ENCOUNTER — Other Ambulatory Visit: Payer: Self-pay | Admitting: Internal Medicine

## 2018-11-14 DIAGNOSIS — R059 Cough, unspecified: Secondary | ICD-10-CM

## 2018-11-14 DIAGNOSIS — R05 Cough: Secondary | ICD-10-CM

## 2018-12-17 ENCOUNTER — Ambulatory Visit: Payer: Medicare Other | Admitting: Podiatry

## 2018-12-17 ENCOUNTER — Other Ambulatory Visit: Payer: Self-pay

## 2018-12-17 ENCOUNTER — Encounter: Payer: Self-pay | Admitting: Podiatry

## 2018-12-17 VITALS — Temp 97.7°F

## 2018-12-17 DIAGNOSIS — B351 Tinea unguium: Secondary | ICD-10-CM | POA: Diagnosis not present

## 2018-12-17 DIAGNOSIS — M79676 Pain in unspecified toe(s): Secondary | ICD-10-CM

## 2018-12-17 DIAGNOSIS — L84 Corns and callosities: Secondary | ICD-10-CM

## 2018-12-17 DIAGNOSIS — I739 Peripheral vascular disease, unspecified: Secondary | ICD-10-CM

## 2018-12-17 NOTE — Patient Instructions (Signed)

## 2018-12-22 NOTE — Progress Notes (Signed)
Subjective: Randall Mcgee is a 78 y.o. y.o. male who presents for preventative foot care today with PAD.  He has h/o  painful, discolored, thick toenails, corns and calluses b/l  which interfere with daily activities. Pain is aggravated when wearing enclosed shoe gear. Pain is relieved with periodic professional debridement.  Isaac Bliss, Rayford Halsted, MD is his PCP. Last visit was 10/29/2018.   Current Outpatient Medications:  .  ALPRAZolam (XANAX) 0.5 MG tablet, TAKE 1 TABLET BY MOUTH TWICE DAILY AS NEEDED FOR SLEEP, Disp: 60 tablet, Rfl: 2 .  aspirin 81 MG tablet, Take 81 mg by mouth daily., Disp: , Rfl:  .  benzonatate (TESSALON) 100 MG capsule, TAKE 1 CAPSULE(100 MG) BY MOUTH TWICE DAILY AS NEEDED FOR COUGH, Disp: 20 capsule, Rfl: 0 .  cetirizine (ZYRTEC) 10 MG tablet, Take 1 tablet (10 mg total) by mouth daily., Disp: 30 tablet, Rfl: 11 .  Cyanocobalamin (VITAMIN B-12 PO), Take 1,000 mg by mouth daily., Disp: , Rfl:  .  indomethacin (INDOCIN) 25 MG capsule, TAKE 1 CAPSULE(25 MG) BY MOUTH THREE TIMES DAILY AS NEEDED, Disp: 270 capsule, Rfl: 1 .  lisinopril (PRINIVIL,ZESTRIL) 40 MG tablet, Take 1 tablet (40 mg total) by mouth daily., Disp: 90 tablet, Rfl: 3 .  Multiple Vitamins-Minerals (CENTRUM SILVER PO), Take 1 tablet by mouth daily. , Disp: , Rfl:  .  propranolol (INDERAL) 20 MG tablet, TAKE 1 TABLET BY MOUTH TWICE DAILY(THIS REPLACES NADOLOL), Disp: 180 tablet, Rfl: 1 .  sildenafil (REVATIO) 20 MG tablet, TAKE 2 TABLETS BY MOUTH DAILY AS DIRECTED, Disp: 180 tablet, Rfl: 1 .  simvastatin (ZOCOR) 40 MG tablet, Take 1 tablet (40 mg total) by mouth every evening., Disp: 90 tablet, Rfl: 1  No Known Allergies  Objective: Vitals:   12/17/18 0945  Temp: 97.7 F (36.5 C)    Vascular Examination: Capillary refill time immediate x 10 digits.  Dorsalis pedis pulses 1/4 bilaterally.  Posterior tibial pulses absent b/l.  No digital hair x 10 digits.  Skin temperature within normal  limits bilaterally.  Dermatological Examination: Skin with normal turgor, texture and tone bilaterally.  Toenails 1-5 b/l discolored, thick, dystrophic with subungual debris and pain with palpation to nailbeds due to thickness of nails.  Hyperkeratotic lesions noted distal tip bilateral third digits, submetatarsal head 5 right foot.  There is no erythema, no edema, no drainage, no flocculence noted.  Musculoskeletal: Muscle strength 5/5 to all LE muscle groups  Neurological: Sensation intact 5/5 bilaterally when tested with 10 gram monofilament.  Vibratory sensation intact bilaterally.  Assessment: 1. Painful onychomycosis toenails 1-5 b/l 2. Calluses submetatarsal head 5 right foot 3. Corns distal tip third digits bilaterally 4. Peripheral arterial disease  Plan: 1. Discuss diabetic foot care principles. Literature dispensed. 2. Toenails 1-5 b/l were debrided in length and girth without iatrogenic bleeding. 3. Hyperkeratotic lesion(s) submetatarsal head 5 right foot and distal tip bilateral third digits were pared with sterile chisel blade and gently smoothed with burr. 4. Patient to continue soft, supportive shoe gear daily. 5. Patient to report any pedal injuries to medical professional immediately. 6. Follow up 3 months.  7. Patient/POA to call should there be a concern in the interim.

## 2019-01-07 ENCOUNTER — Other Ambulatory Visit: Payer: Self-pay | Admitting: Internal Medicine

## 2019-01-07 DIAGNOSIS — F411 Generalized anxiety disorder: Secondary | ICD-10-CM

## 2019-01-09 DIAGNOSIS — C44311 Basal cell carcinoma of skin of nose: Secondary | ICD-10-CM | POA: Diagnosis not present

## 2019-01-29 ENCOUNTER — Other Ambulatory Visit: Payer: Self-pay | Admitting: Internal Medicine

## 2019-02-02 ENCOUNTER — Other Ambulatory Visit: Payer: Self-pay | Admitting: Internal Medicine

## 2019-02-02 DIAGNOSIS — F411 Generalized anxiety disorder: Secondary | ICD-10-CM

## 2019-02-19 ENCOUNTER — Other Ambulatory Visit: Payer: Self-pay | Admitting: Internal Medicine

## 2019-02-19 ENCOUNTER — Ambulatory Visit (INDEPENDENT_AMBULATORY_CARE_PROVIDER_SITE_OTHER): Payer: Medicare Other | Admitting: Internal Medicine

## 2019-02-19 ENCOUNTER — Other Ambulatory Visit: Payer: Self-pay

## 2019-02-19 ENCOUNTER — Encounter: Payer: Self-pay | Admitting: Internal Medicine

## 2019-02-19 VITALS — BP 140/80 | HR 76 | Temp 98.1°F | Wt 207.6 lb

## 2019-02-19 DIAGNOSIS — H6122 Impacted cerumen, left ear: Secondary | ICD-10-CM

## 2019-02-19 DIAGNOSIS — I10 Essential (primary) hypertension: Secondary | ICD-10-CM

## 2019-02-19 MED ORDER — INDOMETHACIN 25 MG PO CAPS: ORAL_CAPSULE | ORAL | 1 refills | Status: DC

## 2019-02-19 MED ORDER — PROPRANOLOL HCL 40 MG PO TABS: 40.0000 mg | ORAL_TABLET | Freq: Two times a day (BID) | ORAL | 1 refills | Status: DC

## 2019-02-19 NOTE — Progress Notes (Signed)
Established Patient Office Visit     CC/Reason for Visit: Hearing loss left ear  HPI: Randall Mcgee is a 78 y.o. male who is coming in today for the above mentioned reasons. He has been seeing Dr. Winifred Olive for a BCC of the nose. 1 month ago he had a skin graft from his left ear to his nose after excision of the [place]. For about 10 days has noticed acute hearing loss of his left ear. Saw Dr. Winifred Olive yesterday who advised he come here for a clean of his ear.  BP remains elevated. He is on Lisinopril 40 and propranolol 20 BID. BPs at home are routinely in the 140/90 range despite doubling of the lisinopril dose during the spring.   Past Medical/Surgical History: Past Medical History:  Diagnosis Date  . Anxiety   . Cancer ([place]) 2017   melanoma on scalp  . History of chicken pox   . Hyperlipidemia   . Hypertension   . Right inguinal hernia     Past Surgical History:  Procedure Laterality Date  . APPLICATION OF A-CELL OF EXTREMITY N/A 07/13/2016   Procedure: APPLICATION OF A-CELL;  Surgeon: Wallace Going, DO;  Location: [place];  Service: Plastics;  Laterality: N/A;  . APPLICATION OF A-CELL OF HEAD/NECK N/A 06/28/2016   Procedure: APPLICATION OF A-CELL OF HEAD/NECK;  Surgeon: Wallace Going, DO;  Location: [place];  Service: Plastics;  Laterality: N/A;  . APPLICATION OF A-CELL OF HEAD/NECK N/A 07/26/2016   Procedure: APPLICATION OF ACELL OF HEAD;  Surgeon: Wallace Going, DO;  Location: [place];  Service: Plastics;  Laterality: N/A;  . APPLICATION OF A-CELL OF HEAD/NECK N/A 08/31/2016   Procedure: APPLICATION OF A-CELL OF HEAD/NECK;  Surgeon: Wallace Going, DO;  Location: [place];  Service: Plastics;  Laterality: N/A;  . CHOLECYSTECTOMY    . ENDARTERECTOMY  06/20/11  . ENDARTERECTOMY  06/20/2011   Procedure: ENDARTERECTOMY CAROTID;  Surgeon: Elam Dutch, MD;  Location: [facility] OR;  Service:  Vascular;  Laterality: Right;  Right Carotid endarterectomy with dacron patch angioplasty   . INGUINAL HERNIA REPAIR Right 08/16/2018   Procedure: RIGHT INGUINAL HERNIA REPAIR WITH MESH;  Surgeon: Georganna Skeans, MD;  Location: [place];  Service: General;  Laterality: Right;  . MASS EXCISION N/A 06/28/2016   Procedure: EXCISION SCALP MELANOMA;  Surgeon: Wallace Going, DO;  Location: [place];  Service: Plastics;  Laterality: N/A;  . MASS EXCISION N/A 07/13/2016   Procedure: RE- EXCISION OF SCALP MELANOMA;  Surgeon: Wallace Going, DO;  Location: [place];  Service: Plastics;  Laterality: N/A;  . MASS EXCISION N/A 07/26/2016   Procedure: RE-EXCISION OF SCALP MELANOMA FOR POSITIVE MARGAIN;  Surgeon: Wallace Going, DO;  Location: [place];  Service: Plastics;  Laterality: N/A;  . SKIN SPLIT GRAFT Left 08/31/2016   Procedure: SKIN GRAFT SPLIT THICKNESS TO HIS SCALP FROM HIS THIGH;  Surgeon: Wallace Going, DO;  Location: [place];  Service: Plastics;  Laterality: Left;  . TONSILLECTOMY    . TONSILLECTOMY      Social History:  reports that he has never smoked. He has never used smokeless tobacco. He reports that he does not drink alcohol or use drugs.  Allergies: No Known Allergies  Family History:  Family History  Problem Relation Age of Onset  . Heart disease Mother  Current Outpatient Medications:  .  ALPRAZolam (XANAX) 0.5 MG tablet, TAKE 1 TABLET BY MOUTH TWICE DAILY AS NEEDED FOR SLEEP, Disp: 60 tablet, Rfl: 1 .  aspirin 81 MG tablet, Take 81 mg by mouth daily., Disp: , Rfl:  .  cetirizine (ZYRTEC) 10 MG tablet, Take 1 tablet (10 mg total) by mouth daily., Disp: 30 tablet, Rfl: 11 .  Cyanocobalamin (VITAMIN B-12 PO), Take 1,000 mg by mouth daily., Disp: , Rfl:  .  indomethacin (INDOCIN) 25 MG capsule, TAKE 1 CAPSULE(25 MG) BY MOUTH THREE TIMES DAILY AS NEEDED, Disp: 90 capsule,  Rfl: 1 .  lisinopril (PRINIVIL,ZESTRIL) 40 MG tablet, Take 1 tablet (40 mg total) by mouth daily., Disp: 90 tablet, Rfl: 3 .  Multiple Vitamins-Minerals (CENTRUM SILVER PO), Take 1 tablet by mouth daily. , Disp: , Rfl:  .  propranolol (INDERAL) 40 MG tablet, Take 1 tablet (40 mg total) by mouth 2 (two) times daily. TAKE 1 TABLET BY MOUTH TWICE DAILY( REPLACES NADOLOL), Disp: 180 tablet, Rfl: 1 .  sildenafil (REVATIO) 20 MG tablet, TAKE 2 TABLETS BY MOUTH DAILY AS DIRECTED, Disp: 180 tablet, Rfl: 1 .  simvastatin (ZOCOR) 40 MG tablet, Take 1 tablet (40 mg total) by mouth every evening., Disp: 90 tablet, Rfl: 1  Review of Systems:  Constitutional: Denies fever, chills, diaphoresis, appetite change and fatigue.  HEENT: Denies photophobia, eye pain, redness,  ear pain, congestion, sore throat, rhinorrhea, sneezing, mouth sores, trouble swallowing, neck pain, neck stiffness and tinnitus.   Respiratory: Denies SOB, DOE, cough, chest tightness,  and wheezing.   Cardiovascular: Denies chest pain, palpitations and leg swelling.  Gastrointestinal: Denies nausea, vomiting, abdominal pain, diarrhea, constipation, blood in stool and abdominal distention.  Genitourinary: Denies dysuria, urgency, frequency, hematuria, flank pain and difficulty urinating.  Endocrine: Denies: hot or cold intolerance, sweats, changes in hair or nails, polyuria, polydipsia. Musculoskeletal: Denies myalgias, back pain, joint swelling, arthralgias and gait problem.  Skin: Denies pallor, rash and wound.  Neurological: Denies dizziness, seizures, syncope, weakness, light-headedness, numbness and headaches.  Hematological: Denies adenopathy. Easy bruising, personal or family bleeding history  Psychiatric/Behavioral: Denies suicidal ideation, mood changes, confusion, nervousness, sleep disturbance and agitation    Physical Exam: Vitals:   02/19/19 1501  BP: 140/80  Pulse: 76  Temp: 98.1 F (36.7 C)  TempSrc: Oral  SpO2: 94%   Weight: 207 lb 9.6 oz (94.2 kg)    Body mass index is 29.79 kg/m.   Constitutional: NAD, calm, comfortable Eyes: PERRL, lids and conjunctivae normal ENMT: Mucous membranes are moist. Posterior pharynx clear of any exudate or lesions. Normal dentition. Tympanic membrane is pearly white, no erythema or bulging on the right, obstructed by cerumen on the left. Psychiatric: Normal judgment and insight. Alert and oriented x 3. Normal mood.    Impression and Plan:  Hearing loss of left ear due to cerumen impaction -Cerumen Desimpaction  Warm water was applied and gentle ear lavage performed on left ear. There were no complications and following the desimpaction the tympanic membranes were visible. Tympanic membranes are intact following the procedure. Auditory canals are normal. The patient reported relief of symptoms after removal of cerumen.   Essential hypertension  -Increase propranolol from 20 to 40 BID. -Will return in 6-8 weeks for BP check. -Continue lisinopril 40.    Patient Instructions  -Nice seeing you today!!  -Increase propranolol to 40 mg twice daily (double dose). New prescription has been sent.  -Schedule follow up in 6-8 weeks.  -Keep monitoring  blood pressure at home 2-3 a week and bring those number in to your next visit.     Lelon Frohlich, MD [facility]

## 2019-02-19 NOTE — Patient Instructions (Signed)
-  Nice seeing you today!!  -Increase propranolol to 40 mg twice daily (double dose). New prescription has been sent.  -Schedule follow up in 6-8 weeks.  -Keep monitoring blood pressure at home 2-3 a week and bring those number in to your next visit.

## 2019-03-12 DIAGNOSIS — L570 Actinic keratosis: Secondary | ICD-10-CM | POA: Diagnosis not present

## 2019-03-19 ENCOUNTER — Encounter: Payer: Self-pay | Admitting: Podiatry

## 2019-03-19 ENCOUNTER — Ambulatory Visit: Payer: Medicare Other | Admitting: Podiatry

## 2019-03-19 ENCOUNTER — Other Ambulatory Visit: Payer: Self-pay

## 2019-03-19 DIAGNOSIS — B351 Tinea unguium: Secondary | ICD-10-CM

## 2019-03-19 DIAGNOSIS — M79676 Pain in unspecified toe(s): Secondary | ICD-10-CM

## 2019-03-19 DIAGNOSIS — L84 Corns and callosities: Secondary | ICD-10-CM

## 2019-03-19 DIAGNOSIS — I739 Peripheral vascular disease, unspecified: Secondary | ICD-10-CM

## 2019-03-19 NOTE — Patient Instructions (Signed)
Diabetes Mellitus and Foot Care Foot care is an important part of your health, especially when you have diabetes. Diabetes may cause you to have problems because of poor blood flow (circulation) to your feet and legs, which can cause your skin to:  Become thinner and drier.  Break more easily.  Heal more slowly.  Peel and crack. You may also have nerve damage (neuropathy) in your legs and feet, causing decreased feeling in them. This means that you may not notice minor injuries to your feet that could lead to more serious problems. Noticing and addressing any potential problems early is the best way to prevent future foot problems. How to care for your feet Foot hygiene  Wash your feet daily with warm water and mild soap. Do not use hot water. Then, pat your feet and the areas between your toes until they are completely dry. Do not soak your feet as this can dry your skin.  Trim your toenails straight across. Do not dig under them or around the cuticle. File the edges of your nails with an emery board or nail file.  Apply a moisturizing lotion or petroleum jelly to the skin on your feet and to dry, brittle toenails. Use lotion that does not contain alcohol and is unscented. Do not apply lotion between your toes. Shoes and socks  Wear clean socks or stockings every day. Make sure they are not too tight. Do not wear knee-high stockings since they may decrease blood flow to your legs.  Wear shoes that fit properly and have enough cushioning. Always look in your shoes before you put them on to be sure there are no objects inside.  To break in new shoes, wear them for just a few hours a day. This prevents injuries on your feet. Wounds, scrapes, corns, and calluses  Check your feet daily for blisters, cuts, bruises, sores, and redness. If you cannot see the bottom of your feet, use a mirror or ask someone for help.  Do not cut corns or calluses or try to remove them with medicine.  If you  find a minor scrape, cut, or break in the skin on your feet, keep it and the skin around it clean and dry. You may clean these areas with mild soap and water. Do not clean the area with peroxide, alcohol, or iodine.  If you have a wound, scrape, corn, or callus on your foot, look at it several times a day to make sure it is healing and not infected. Check for: ? Redness, swelling, or pain. ? Fluid or blood. ? Warmth. ? Pus or a bad smell. General instructions  Do not cross your legs. This may decrease blood flow to your feet.  Do not use heating pads or hot water bottles on your feet. They may burn your skin. If you have lost feeling in your feet or legs, you may not know this is happening until it is too late.  Protect your feet from hot and cold by wearing shoes, such as at the beach or on hot pavement.  Schedule a complete foot exam at least once a year (annually) or more often if you have foot problems. If you have foot problems, report any cuts, sores, or bruises to your health care provider immediately. Contact a health care provider if:  You have a medical condition that increases your risk of infection and you have any cuts, sores, or bruises on your feet.  You have an injury that is not   healing.  You have redness on your legs or feet.  You feel burning or tingling in your legs or feet.  You have pain or cramps in your legs and feet.  Your legs or feet are numb.  Your feet always feel cold.  You have pain around a toenail. Get help right away if:  You have a wound, scrape, corn, or callus on your foot and: ? You have pain, swelling, or redness that gets worse. ? You have fluid or blood coming from the wound, scrape, corn, or callus. ? Your wound, scrape, corn, or callus feels warm to the touch. ? You have pus or a bad smell coming from the wound, scrape, corn, or callus. ? You have a fever. ? You have a red line going up your leg. Summary  Check your feet every day  for cuts, sores, red spots, swelling, and blisters.  Moisturize feet and legs daily.  Wear shoes that fit properly and have enough cushioning.  If you have foot problems, report any cuts, sores, or bruises to your health care provider immediately.  Schedule a complete foot exam at least once a year (annually) or more often if you have foot problems. This information is not intended to replace advice given to you by your health care provider. Make sure you discuss any questions you have with your health care provider. Document Released: 07/07/2000 Document Revised: 08/22/2017 Document Reviewed: 08/11/2016 Elsevier Patient Education  2020 [employer]   Onychomycosis/Fungal Toenails  WHAT IS IT? An infection that lies within the keratin of your nail plate that is caused by a fungus.  WHY ME? Fungal infections affect all ages, sexes, races, and creeds.  There may be many factors that predispose you to a fungal infection such as age, coexisting medical conditions such as diabetes, or an autoimmune disease; stress, medications, fatigue, genetics, etc.  Bottom line: fungus thrives in a warm, moist environment and your shoes offer such a location.  IS IT CONTAGIOUS? Theoretically, yes.  You do not want to share shoes, nail clippers or files with someone who has fungal toenails.  Walking around barefoot in the same room or sleeping in the same bed is unlikely to transfer the organism.  It is important to realize, however, that fungus can spread easily from one nail to the next on the same foot.  HOW DO WE TREAT THIS?  There are several ways to treat this condition.  Treatment may depend on many factors such as age, medications, pregnancy, liver and kidney conditions, etc.  It is best to ask your doctor which options are available to you.  1. No treatment.   Unlike many other medical concerns, you can live with this condition.  However for many people this can be a painful condition and may lead to  ingrown toenails or a bacterial infection.  It is recommended that you keep the nails cut short to help reduce the amount of fungal nail. 2. Topical treatment.  These range from herbal remedies to prescription strength nail lacquers.  About 40-50% effective, topicals require twice daily application for approximately 9 to 12 months or until an entirely new nail has grown out.  The most effective topicals are medical grade medications available through physicians offices. 3. Oral antifungal medications.  With an 80-90% cure rate, the most common oral medication requires 3 to 4 months of therapy and stays in your system for a year as the new nail grows out.  Oral antifungal medications do require   blood work to make sure it is a safe drug for you.  A liver function panel will be performed prior to starting the medication and after the first month of treatment.  It is important to have the blood work performed to avoid any harmful side effects.  In general, this medication safe but blood work is required. 4. Laser Therapy.  This treatment is performed by applying a specialized laser to the affected nail plate.  This therapy is noninvasive, fast, and non-painful.  It is not covered by insurance and is therefore, out of pocket.  The results have been very good with a 80-95% cure rate.  The [facility] is the only practice in the area to offer this therapy. 5. Permanent Nail Avulsion.  Removing the entire nail so that a new nail will not grow back. 

## 2019-03-24 ENCOUNTER — Telehealth (INDEPENDENT_AMBULATORY_CARE_PROVIDER_SITE_OTHER): Payer: Medicare Other | Admitting: Family Medicine

## 2019-03-24 ENCOUNTER — Other Ambulatory Visit: Payer: Self-pay

## 2019-03-24 DIAGNOSIS — R509 Fever, unspecified: Secondary | ICD-10-CM | POA: Diagnosis not present

## 2019-03-24 DIAGNOSIS — M791 Myalgia, unspecified site: Secondary | ICD-10-CM | POA: Diagnosis not present

## 2019-03-24 NOTE — Progress Notes (Signed)
This visit type was conducted due to national recommendations for restrictions regarding the COVID-19 pandemic in an effort to limit this patient's exposure and mitigate transmission in our community.   Virtual Visit via Telephone Note  I connected with Randall Mcgee on 03/24/19 at  2:45 PM EDT by telephone and verified that I am speaking with the correct person using two identifiers.   I discussed the limitations, risks, security and privacy concerns of performing an evaluation and management service by telephone and the availability of in person appointments. I also discussed with the patient that there may be a patient responsible charge related to this service. The patient expressed understanding and agreed to proceed.  Location patient: home Location provider: work or home office Participants present for the call: patient, provider Patient did not have a visit in the prior 7 days to address this/these issue(s).   History of Present Illness: Patient apparently felt poorly Saturday night.  He noted some mild headache and fatigue and last night had a fever of 100 but none today.  He has had some body aches.  Rare cough.  Denies any urinary symptoms, sore throat, diarrhea, or any abdominal pain.  No recent tick bites.  He and his wife have been fairly isolated.  He does go to the grocery store once per week.  No known sick contacts. Denies any nausea or vomiting.  No loss of taste or smell   Observations/Objective: Patient sounds cheerful and well on the phone. I do not appreciate any SOB. Speech and thought processing are grossly intact. Patient reported vitals:  Assessment and Plan:  Reported low-grade fever and nonspecific symptoms of intermittent headache and malaise and body aches  -Recommend COVID-19 testing. -Plenty of fluids and rest and stay quarantined until test results back -Follow-up immediately for increased shortness of breath or other concerns  Follow Up  Instructions:  -As above   [id number] [id number] [id number] [id number] [id number] 21-30 I did not refer this patient for an OV in the next 24 hours for this/these issue(s).  I discussed the assessment and treatment plan with the patient. The patient was provided an opportunity to ask questions and all were answered. The patient agreed with the plan and demonstrated an understanding of the instructions.   The patient was advised to call back or seek an in-person evaluation if the symptoms worsen or if the condition fails to improve as anticipated.  I provided 18 minutes of non-face-to-face time during this encounter.   Carolann Littler, MD

## 2019-03-25 ENCOUNTER — Inpatient Hospital Stay (HOSPITAL_COMMUNITY): Payer: Medicare Other

## 2019-03-25 ENCOUNTER — Emergency Department (HOSPITAL_COMMUNITY): Payer: Medicare Other

## 2019-03-25 ENCOUNTER — Other Ambulatory Visit: Payer: Self-pay

## 2019-03-25 ENCOUNTER — Other Ambulatory Visit: Payer: Self-pay | Admitting: Emergency Medicine

## 2019-03-25 ENCOUNTER — Encounter (HOSPITAL_COMMUNITY): Payer: Self-pay

## 2019-03-25 ENCOUNTER — Inpatient Hospital Stay (HOSPITAL_COMMUNITY)
Admission: EM | Admit: 2019-03-25 | Discharge: 2019-04-05 | DRG: 040 | Disposition: A | Discharge status: Home Health | Payer: Medicare Other | Attending: Internal Medicine | Admitting: Internal Medicine

## 2019-03-25 ENCOUNTER — Telehealth: Payer: Self-pay

## 2019-03-25 DIAGNOSIS — I629 Nontraumatic intracranial hemorrhage, unspecified: Secondary | ICD-10-CM | POA: Diagnosis not present

## 2019-03-25 DIAGNOSIS — R531 Weakness: Secondary | ICD-10-CM | POA: Diagnosis present

## 2019-03-25 DIAGNOSIS — R0602 Shortness of breath: Secondary | ICD-10-CM | POA: Diagnosis not present

## 2019-03-25 DIAGNOSIS — Z20828 Contact with and (suspected) exposure to other viral communicable diseases: Secondary | ICD-10-CM | POA: Diagnosis present

## 2019-03-25 DIAGNOSIS — C7801 Secondary malignant neoplasm of right lung: Secondary | ICD-10-CM | POA: Diagnosis not present

## 2019-03-25 DIAGNOSIS — I615 Nontraumatic intracerebral hemorrhage, intraventricular: Secondary | ICD-10-CM | POA: Diagnosis not present

## 2019-03-25 DIAGNOSIS — Z79899 Other long term (current) drug therapy: Secondary | ICD-10-CM | POA: Diagnosis not present

## 2019-03-25 DIAGNOSIS — F22 Delusional disorders: Secondary | ICD-10-CM | POA: Diagnosis present

## 2019-03-25 DIAGNOSIS — C439 Malignant melanoma of skin, unspecified: Secondary | ICD-10-CM | POA: Diagnosis not present

## 2019-03-25 DIAGNOSIS — E785 Hyperlipidemia, unspecified: Secondary | ICD-10-CM | POA: Diagnosis not present

## 2019-03-25 DIAGNOSIS — Z23 Encounter for immunization: Secondary | ICD-10-CM | POA: Diagnosis not present

## 2019-03-25 DIAGNOSIS — Z9049 Acquired absence of other specified parts of digestive tract: Secondary | ICD-10-CM | POA: Diagnosis not present

## 2019-03-25 DIAGNOSIS — F411 Generalized anxiety disorder: Secondary | ICD-10-CM

## 2019-03-25 DIAGNOSIS — R410 Disorientation, unspecified: Secondary | ICD-10-CM

## 2019-03-25 DIAGNOSIS — R7302 Impaired glucose tolerance (oral): Secondary | ICD-10-CM | POA: Diagnosis not present

## 2019-03-25 DIAGNOSIS — R29701 NIHSS score 1: Secondary | ICD-10-CM | POA: Diagnosis not present

## 2019-03-25 DIAGNOSIS — M25551 Pain in right hip: Secondary | ICD-10-CM

## 2019-03-25 DIAGNOSIS — C249 Malignant neoplasm of biliary tract, unspecified: Secondary | ICD-10-CM | POA: Diagnosis not present

## 2019-03-25 DIAGNOSIS — R945 Abnormal results of liver function studies: Secondary | ICD-10-CM

## 2019-03-25 DIAGNOSIS — Z7982 Long term (current) use of aspirin: Secondary | ICD-10-CM | POA: Diagnosis not present

## 2019-03-25 DIAGNOSIS — T380X5A Adverse effect of glucocorticoids and synthetic analogues, initial encounter: Secondary | ICD-10-CM | POA: Diagnosis not present

## 2019-03-25 DIAGNOSIS — F419 Anxiety disorder, unspecified: Secondary | ICD-10-CM | POA: Diagnosis present

## 2019-03-25 DIAGNOSIS — F17200 Nicotine dependence, unspecified, uncomplicated: Secondary | ICD-10-CM | POA: Diagnosis present

## 2019-03-25 DIAGNOSIS — C801 Malignant (primary) neoplasm, unspecified: Secondary | ICD-10-CM | POA: Diagnosis not present

## 2019-03-25 DIAGNOSIS — H53462 Homonymous bilateral field defects, left side: Secondary | ICD-10-CM | POA: Diagnosis present

## 2019-03-25 DIAGNOSIS — Z85820 Personal history of malignant melanoma of skin: Secondary | ICD-10-CM | POA: Diagnosis not present

## 2019-03-25 DIAGNOSIS — C7802 Secondary malignant neoplasm of left lung: Secondary | ICD-10-CM | POA: Diagnosis present

## 2019-03-25 DIAGNOSIS — I619 Nontraumatic intracerebral hemorrhage, unspecified: Secondary | ICD-10-CM

## 2019-03-25 DIAGNOSIS — E860 Dehydration: Secondary | ICD-10-CM | POA: Diagnosis not present

## 2019-03-25 DIAGNOSIS — N179 Acute kidney failure, unspecified: Secondary | ICD-10-CM | POA: Diagnosis not present

## 2019-03-25 DIAGNOSIS — R599 Enlarged lymph nodes, unspecified: Secondary | ICD-10-CM

## 2019-03-25 DIAGNOSIS — C7931 Secondary malignant neoplasm of brain: Principal | ICD-10-CM | POA: Diagnosis present

## 2019-03-25 DIAGNOSIS — I10 Essential (primary) hypertension: Secondary | ICD-10-CM | POA: Diagnosis not present

## 2019-03-25 DIAGNOSIS — R590 Localized enlarged lymph nodes: Secondary | ICD-10-CM | POA: Diagnosis not present

## 2019-03-25 DIAGNOSIS — E7800 Pure hypercholesterolemia, unspecified: Secondary | ICD-10-CM | POA: Diagnosis not present

## 2019-03-25 DIAGNOSIS — I612 Nontraumatic intracerebral hemorrhage in hemisphere, unspecified: Secondary | ICD-10-CM | POA: Diagnosis not present

## 2019-03-25 DIAGNOSIS — Z20822 Contact with and (suspected) exposure to covid-19: Secondary | ICD-10-CM

## 2019-03-25 DIAGNOSIS — R591 Generalized enlarged lymph nodes: Secondary | ICD-10-CM

## 2019-03-25 DIAGNOSIS — C770 Secondary and unspecified malignant neoplasm of lymph nodes of head, face and neck: Secondary | ICD-10-CM | POA: Diagnosis present

## 2019-03-25 DIAGNOSIS — R6889 Other general symptoms and signs: Secondary | ICD-10-CM | POA: Diagnosis not present

## 2019-03-25 DIAGNOSIS — R918 Other nonspecific abnormal finding of lung field: Secondary | ICD-10-CM

## 2019-03-25 DIAGNOSIS — T424X5A Adverse effect of benzodiazepines, initial encounter: Secondary | ICD-10-CM | POA: Diagnosis not present

## 2019-03-25 DIAGNOSIS — G936 Cerebral edema: Secondary | ICD-10-CM | POA: Diagnosis present

## 2019-03-25 DIAGNOSIS — R079 Chest pain, unspecified: Secondary | ICD-10-CM | POA: Diagnosis not present

## 2019-03-25 LAB — COMPREHENSIVE METABOLIC PANEL
ALT: 24 U/L (ref 0–44)
AST: 48 U/L — ABNORMAL HIGH (ref 15–41)
Albumin: 3.8 g/dL (ref 3.5–5.0)
Alkaline Phosphatase: 41 U/L (ref 38–126)
Anion gap: 9 (ref 5–15)
BUN: 15 mg/dL (ref 8–23)
CO2: 26 mmol/L (ref 22–32)
Calcium: 9.7 mg/dL (ref 8.9–10.3)
Chloride: 106 mmol/L (ref 98–111)
Creatinine, Ser: 1.26 mg/dL — ABNORMAL HIGH (ref 0.61–1.24)
GFR calc Af Amer: >60 mL/min (ref >60)
GFR calc non Af Amer: 54 mL/min — ABNORMAL LOW (ref >60)
Glucose, Bld: 183 mg/dL — ABNORMAL HIGH (ref 70–99)
Potassium: 4.3 mmol/L (ref 3.5–5.1)
Sodium: 141 mmol/L (ref 135–145)
Total Bilirubin: 0.6 mg/dL (ref 0.3–1.2)
Total Protein: 6.8 g/dL (ref 6.5–8.1)

## 2019-03-25 LAB — CBC
HCT: 39.6 % (ref 39.0–52.0)
Hemoglobin: 13.4 g/dL (ref 13.0–17.0)
MCH: 33.8 pg (ref 26.0–34.0)
MCHC: 33.8 g/dL (ref 30.0–36.0)
MCV: 100 fL (ref 80.0–100.0)
Platelets: 201 10*3/uL (ref 150–400)
RBC: 3.96 MIL/uL — ABNORMAL LOW (ref 4.22–5.81)
RDW: 12.6 % (ref 11.5–15.5)
WBC: 7 10*3/uL (ref 4.0–10.5)
nRBC: 0 % (ref 0.0–0.2)

## 2019-03-25 LAB — CK TOTAL AND CKMB (NOT AT ARMC)
CK, MB: 2.7 ng/mL (ref 0.5–5.0)
Relative Index: 1.6 (ref 0.0–2.5)
Total CK: 164 U/L (ref 49–397)

## 2019-03-25 LAB — PROTIME-INR
INR: 1.1 (ref 0.8–1.2)
Prothrombin Time: 14 seconds (ref 11.4–15.2)

## 2019-03-25 LAB — HEMOGLOBIN A1C
Hgb A1c MFr Bld: 6.1 % — ABNORMAL HIGH (ref 4.8–5.6)
Mean Plasma Glucose: 128.37 mg/dL

## 2019-03-25 LAB — TROPONIN I (HIGH SENSITIVITY): Troponin I (High Sensitivity): 17 ng/L (ref <18)

## 2019-03-25 LAB — APTT: aPTT: 30 seconds (ref 24–36)

## 2019-03-25 LAB — SEDIMENTATION RATE: Sed Rate: 45 mm/hr — ABNORMAL HIGH (ref 0–16)

## 2019-03-25 MED ORDER — ACETAMINOPHEN 325 MG PO TABS
650.0000 mg | ORAL_TABLET | Freq: Once | ORAL | Status: AC
  Administered 2019-03-25: 19:00:00 650 mg via ORAL
  Filled 2019-03-25: qty 2

## 2019-03-25 MED ORDER — SODIUM CHLORIDE 0.9 % IV BOLUS
500.0000 mL | Freq: Once | INTRAVENOUS | Status: AC
  Administered 2019-03-25: 500 mL via INTRAVENOUS

## 2019-03-25 MED ORDER — SODIUM CHLORIDE 0.9% FLUSH: 3.0000 mL | Freq: Once | INTRAVENOUS | Status: DC

## 2019-03-25 NOTE — Consult Note (Signed)
Requesting Physician: Janeece Fitting     Chief Complaint: Headache, memory loss  History obtained from: Patient and Chart    HPI:                                                                                                                                       Randall Mcgee is a 78 y.o. male with past medical history of melanoma on scalp, hypertension, hyperlipidemia presents to the emergency department after having trouble walking, difficulty with his memory, difficulty with reading as well as severe headache that started on Saturday 2 AM.  Patient had a fever on Sunday at 1 PM of 100 F and another fever the following day.  Patient and wife at bedside denies any history of worsening cognition and there has been no concern for dementia.  Patient denies any history of rapid weight loss.  He does have a remote history of melanoma of the scalp as well as basal cell carcinoma of his nose.  CT head performed in the emergency room showed moderate to large hemorrhage into right occipital lobe with some mass-effect as well as multiple small hemorrhages in the left hemisphere. Neurology was consulted for further recommendations.  Date last known well: 03/22/19 tPA Given: no, hemorrhage NIHSS: 1 Baseline MRS 0  Intracerebral Hemorrhage (ICH) Score  Glascow Coma Score  13-15 0  Age >/= 80 yes +1  ICH volume no 0  IVH yes no 0  Infratentorial origin no 0    Past Medical History:  Diagnosis Date  . Anxiety   . Cancer ([place]) 2017   melanoma on scalp  . History of chicken pox   . Hyperlipidemia   . Hypertension   . Right inguinal hernia     Past Surgical History:  Procedure Laterality Date  . APPLICATION OF A-CELL OF EXTREMITY N/A 07/13/2016   Procedure: APPLICATION OF A-CELL;  Surgeon: Wallace Going, DO;  Location: [facility];  Service: Plastics;  Laterality: N/A;  . APPLICATION OF A-CELL OF HEAD/NECK N/A 06/28/2016   Procedure: APPLICATION OF A-CELL OF  HEAD/NECK;  Surgeon: Wallace Going, DO;  Location: [facility];  Service: Plastics;  Laterality: N/A;  . APPLICATION OF A-CELL OF HEAD/NECK N/A 07/26/2016   Procedure: APPLICATION OF ACELL OF HEAD;  Surgeon: Wallace Going, DO;  Location: [facility];  Service: Plastics;  Laterality: N/A;  . APPLICATION OF A-CELL OF HEAD/NECK N/A 08/31/2016   Procedure: APPLICATION OF A-CELL OF HEAD/NECK;  Surgeon: Wallace Going, DO;  Location: [facility];  Service: Plastics;  Laterality: N/A;  . CATARACT EXTRACTION, BILATERAL    . CHOLECYSTECTOMY    . ENDARTERECTOMY  06/20/11  . ENDARTERECTOMY  06/20/2011   Procedure: ENDARTERECTOMY CAROTID;  Surgeon: Elam Dutch, MD;  Location: [facility] OR;  Service: Vascular;  Laterality: Right;  Right Carotid endarterectomy with dacron patch angioplasty   .  INGUINAL HERNIA REPAIR Right 08/16/2018   Procedure: RIGHT INGUINAL HERNIA REPAIR WITH MESH;  Surgeon: Georganna Skeans, MD;  Location: [facility];  Service: General;  Laterality: Right;  . MASS EXCISION N/A 06/28/2016   Procedure: EXCISION SCALP MELANOMA;  Surgeon: Wallace Going, DO;  Location: [facility];  Service: Plastics;  Laterality: N/A;  . MASS EXCISION N/A 07/13/2016   Procedure: RE- EXCISION OF SCALP MELANOMA;  Surgeon: Wallace Going, DO;  Location: [facility];  Service: Plastics;  Laterality: N/A;  . MASS EXCISION N/A 07/26/2016   Procedure: RE-EXCISION OF SCALP MELANOMA FOR POSITIVE MARGAIN;  Surgeon: Wallace Going, DO;  Location: [facility];  Service: Plastics;  Laterality: N/A;  . SKIN SPLIT GRAFT Left 08/31/2016   Procedure: SKIN GRAFT SPLIT THICKNESS TO HIS SCALP FROM HIS THIGH;  Surgeon: Wallace Going, DO;  Location: [facility];  Service: Plastics;  Laterality: Left;  . TONSILLECTOMY    . TONSILLECTOMY      Family History  Problem Relation Age of Onset  .  Heart disease Mother    Social History:  reports that he has never smoked. He has never used smokeless tobacco. He reports that he does not drink alcohol or use drugs.  Allergies: No Known Allergies  Medications:                                                                                                                        I reviewed home medications.  Patient is on aspirin   ROS:                                                                                                                                     14 systems reviewed and negative except above    Examination:  General: Appears well-developed  Psych: Affect appropriate to situation Eyes: No scleral injection HENT: No OP obstrucion Head: Normocephalic.  Cardiovascular: Normal rate and regular rhythm.  Respiratory: Effort normal and breath sounds normal to anterior ascultation GI: Soft.  No distension. There is no tenderness.  Skin: WDI    Neurological Examination Mental Status: Alert, oriented, thought content appropriate.  Speech fluent without evidence of aphasia. Able to follow 3 step commands without difficulty. Cranial Nerves: II: Visual fields : Left homonymous hemianopsia III,IV, VI: ptosis not present, extra-ocular motions intact bilaterally, pupils equal, round, reactive to light and accommodation V,VII: smile symmetric, facial light touch sensation normal bilaterally VIII: hearing normal bilaterally IX,X: uvula rises symmetrically XI: bilateral shoulder shrug XII: midline tongue extension Motor: Right : Upper extremity   5/5    Left:     Upper extremity   5/5  Lower extremity   5/5     Lower extremity   5/5 Tone and bulk:normal tone throughout; no atrophy noted Sensory: Pinprick and light touch intact throughout, bilaterally Deep Tendon Reflexes: 2+ and symmetric throughout Plantars: Right:  downgoing   Left: downgoing Cerebellar: normal finger-to-nose, normal rapid alternating movements     Lab Results: Basic Metabolic Panel: Recent Labs  Lab 03/25/19 1354  NA 141  K 4.3  CL 106  CO2 26  GLUCOSE 183*  BUN 15  CREATININE 1.26*  CALCIUM 9.7    CBC: Recent Labs  Lab 03/25/19 1354  WBC 7.0  HGB 13.4  HCT 39.6  MCV 100.0  PLT 201    Coagulation Studies: No results for input(s): LABPROT, INR in the last 72 hours.  Imaging: Ct Head Wo Contrast  Result Date: 03/25/2019 CLINICAL DATA:  Ct head wo, Pt's wife reports that Sunday morning he woke her up and was extremely disoriented and confused. Pt reported to wife he had a bad headache and temperature. EXAM: CT HEAD WITHOUT CONTRAST TECHNIQUE: Contiguous axial images were obtained from the base of the skull through the vertex without intravenous contrast. COMPARISON:  None. FINDINGS: Brain: There are 3 areas of acute masslike hemorrhage in the bilateral occipital and left temporal lobes. The largest area in the right occipital lobe measures 5.0 x 3.2 cm. A small area in the posterior left occipital lobe measures 1.6 by 1.8 cm. And another area in the left temporal lobe measures 1.4 by 1.1 cm. There is mass effect on the occipital hormone of the right lateral ventricle secondary to edema surrounding the hemorrhage in the posterior right occipital lobe. No midline shift. Mild enlargement of the ventricles is likely secondary to ex vacuo dilation from brain atrophy. Vascular: No hyperdense vessel or unexpected calcification. Skull: Normal. Negative for fracture or focal lesion. Sinuses/Orbits: Mucosal thickening in the bilateral maxillary and ethmoid sinuses. Other: None. IMPRESSION: Multiple areas of acute hemorrhage in the bilateral occipital and left temporal lobes. The largest area in the right posterior occipital lobe measures 5 x 3 cm with some mass effect on the right lateral ventricle. Differential considerations include  hemorrhagic metastasis and emboli. Consider further evaluation with brain MRI. These results were called by telephone at the time of interpretation on 03/25/2019 at 6:55 pm to Dr. Janeece Fitting , who verbally acknowledged these results. Electronically Signed   By: Audie Pinto M.D.   On: 03/25/2019 19:05   Dg Chest Portable 1 View  Result Date: 03/25/2019 CLINICAL DATA:  Chest pain and shortness of breath. EXAM: PORTABLE CHEST 1 VIEW COMPARISON:  Chest x-ray dated June 13, 2011. FINDINGS: The heart size and mediastinal contours are within normal limits. Normal pulmonary vascularity. No focal consolidation, pleural effusion, or pneumothorax. Unchanged mild elevation of the left hemidiaphragm. No acute osseous abnormality. IMPRESSION: No active disease. Electronically Signed   By: Titus Dubin M.D.   On: 03/25/2019 16:43     ASSESSMENT AND PLAN   78 y.o. male with past medical history of melanoma on scalp, hypertension, hyperlipidemia presents to the emergency department after having trouble walking, difficulty with his memory, difficulty with reading as well as severe headache.  Blood pressure on arrival was around 0000000 systolic. Patient noted to have multiple hemorrhages on CT head.  MRI brain consistent with hemorrhagic mets.  On examination patient has left homonymous hemianopsia likely from her right occipital lobe mass as well as memory problems may be from lesion in the left temporal lobe.   Hemorrhagic brain metastasis -Hold aspirin/anticoagulation -Oncology consult to evaluate for source of malignancy  -Consider CT chest and if normal consider CT abdomen pelvis w/contrast, however has history of possible melanoma on scalp and may not be required - Will defer to Oncology whether they will need Neurosurgery consultation  -BP goal normotension  Neurology will be available as needed.  Thanks for the consultation.  I spent greater than 30 minutes discussion with the patient and family  regarding diagnosis and answering all questions.    [facility] Pager Number [phone]

## 2019-03-25 NOTE — ED Provider Notes (Signed)
[facility] EMERGENCY DEPARTMENT Provider Note   CSN: [id number] Arrival date & time: 03/25/19  1328     History   Chief Complaint Chief Complaint  Patient presents with   Altered Mental Status    HPI Randall Mcgee is a 78 y.o. male.     78 y.o male with a PMH of Anxiety, HTN, Hyperlipidemia presents to the ED with a chief complaint of disorientation. Patient's wife at the bedside reports patient complained of a headache on Saturday located to the occipital part of his head, she states patient was ambulated very disoriented during this episode, he was not remember his son's birthday or pertinent information. He reports taking advil to help with his headache which it helped. He states since this episode he has been feeling more fatigued, disoriented, states he has been running a fever with a T-max of 100.  He also endorses fatigue, states his symptoms feel like they are slightly worsening.  Wife also reports they went to a drive-through testing center for COVID-19, was swabbed this morning but has not had results.  He also reports he has felt some shortness of breath, mainly with activity.  On today's visit, patient endorses pain along the right hip, states this is worse with movement, according to his chart he does have a right inguinal hernia.  He has had no nausea, vomiting, prior history of strokes, chest pain or abdominal pain.  The history is provided by the patient and medical records.  Altered Mental Status Associated symptoms: fever, headaches, light-headedness and weakness   Associated symptoms: no abdominal pain, no nausea and no vomiting     Past Medical History:  Diagnosis Date   Anxiety    Cancer ([place]) 2017   melanoma on scalp   History of chicken pox    Hyperlipidemia    Hypertension    Right inguinal hernia     Patient Active Problem List   Diagnosis Date Noted   Erectile dysfunction 06/19/2018   Anxiety state 06/19/2018   Status  post split thickness skin graft 09/08/2016   Melanoma of skin ([place]) 07/13/2016   Malignant melanoma of scalp ([place]) 05/29/2016   Elevated TSH 07/27/2015   Impaired glucose tolerance 07/27/2015   Carotid artery stenosis 11/07/2011   Right inguinal hernia 11/07/2011   Hypertension 10/03/2010   Hypercholesterolemia 10/03/2010    Past Surgical History:  Procedure Laterality Date   APPLICATION OF A-CELL OF EXTREMITY N/A 07/13/2016   Procedure: APPLICATION OF A-CELL;  Surgeon: Wallace Going, DO;  Location: [place];  Service: Plastics;  Laterality: N/A;   APPLICATION OF A-CELL OF HEAD/NECK N/A 06/28/2016   Procedure: APPLICATION OF A-CELL OF HEAD/NECK;  Surgeon: Wallace Going, DO;  Location: [place];  Service: Plastics;  Laterality: N/A;   APPLICATION OF A-CELL OF HEAD/NECK N/A 07/26/2016   Procedure: APPLICATION OF ACELL OF HEAD;  Surgeon: Wallace Going, DO;  Location: [place];  Service: Plastics;  Laterality: N/A;   APPLICATION OF A-CELL OF HEAD/NECK N/A 08/31/2016   Procedure: APPLICATION OF A-CELL OF HEAD/NECK;  Surgeon: Wallace Going, DO;  Location: [place];  Service: Plastics;  Laterality: N/A;   CHOLECYSTECTOMY     ENDARTERECTOMY  06/20/11   ENDARTERECTOMY  06/20/2011   Procedure: ENDARTERECTOMY CAROTID;  Surgeon: Elam Dutch, MD;  Location: [facility] OR;  Service: Vascular;  Laterality: Right;  Right Carotid endarterectomy with dacron patch angioplasty    INGUINAL HERNIA  REPAIR Right 08/16/2018   Procedure: RIGHT INGUINAL HERNIA REPAIR WITH MESH;  Surgeon: Georganna Skeans, MD;  Location: [place];  Service: General;  Laterality: Right;   MASS EXCISION N/A 06/28/2016   Procedure: EXCISION SCALP MELANOMA;  Surgeon: Wallace Going, DO;  Location: [place];  Service: Plastics;  Laterality: N/A;   MASS EXCISION N/A 07/13/2016   Procedure: RE-  EXCISION OF SCALP MELANOMA;  Surgeon: Wallace Going, DO;  Location: [place];  Service: Plastics;  Laterality: N/A;   MASS EXCISION N/A 07/26/2016   Procedure: RE-EXCISION OF SCALP MELANOMA FOR POSITIVE MARGAIN;  Surgeon: Wallace Going, DO;  Location: [place];  Service: Plastics;  Laterality: N/A;   SKIN SPLIT GRAFT Left 08/31/2016   Procedure: SKIN GRAFT SPLIT THICKNESS TO HIS SCALP FROM HIS THIGH;  Surgeon: Wallace Going, DO;  Location: [place];  Service: Plastics;  Laterality: Left;   TONSILLECTOMY     TONSILLECTOMY          Home Medications    Prior to Admission medications   Medication Sig Start Date End Date Taking? Authorizing Provider  ALPRAZolam (XANAX) 0.5 MG tablet TAKE 1 TABLET BY MOUTH TWICE DAILY AS NEEDED FOR SLEEP Patient taking differently: Take 0.5 mg by mouth See admin instructions. Take 0.5 mg by mouth at bedtime and an additional 0.5 mg once a day as needed for anxiety 02/04/19  Yes Isaac Bliss, Rayford Halsted, MD  aspirin 81 MG tablet Take 81 mg by mouth daily.   Yes [provider]  cetirizine (ZYRTEC) 10 MG tablet Take 1 tablet (10 mg total) by mouth daily. Patient taking differently: Take 10 mg by mouth at bedtime.  06/19/18  Yes Isaac Bliss, Rayford Halsted, MD  Cyanocobalamin (VITAMIN B-12 PO) Take 1,000 mg by mouth daily.   Yes [provider]  ibuprofen (ADVIL) 200 MG tablet Take 200 mg by mouth every 6 (six) hours as needed for headache or mild pain.   Yes [provider]  indomethacin (INDOCIN) 25 MG capsule TAKE 1 CAPSULE(25 MG) BY MOUTH THREE TIMES DAILY AS NEEDED Patient taking differently: Take 25 mg by mouth See admin instructions. Take 25 mg by mouth three times a week as needed for gout flares 02/20/19  Yes Isaac Bliss, Rayford Halsted, MD  lisinopril (PRINIVIL,ZESTRIL) 40 MG tablet Take 1 tablet (40 mg total) by mouth daily. 09/19/18  Yes Isaac Bliss, Rayford Halsted, MD  Multiple Vitamins-Minerals (CENTRUM SILVER PO) Take 1 tablet by mouth daily.    Yes [provider]  propranolol (INDERAL) 20 MG tablet Take 40 mg by mouth 2 (two) times daily.   Yes [provider]  simvastatin (ZOCOR) 40 MG tablet Take 1 tablet (40 mg total) by mouth every evening. Patient taking differently: Take 40 mg by mouth at bedtime.  09/17/18  Yes Isaac Bliss, Rayford Halsted, MD  fluorouracil (EFUDEX) 5 % cream Apply 1 application topically.  03/12/19   [provider]  propranolol (INDERAL) 40 MG tablet Take 1 tablet (40 mg total) by mouth 2 (two) times daily. TAKE 1 TABLET BY MOUTH TWICE DAILY( REPLACES NADOLOL) Patient not taking: Reported on 03/25/2019 02/19/19   Isaac Bliss, Rayford Halsted, MD  sildenafil (REVATIO) 20 MG tablet TAKE 2 TABLETS BY MOUTH DAILY AS DIRECTED Patient not taking: Reported on 03/25/2019 06/19/18   Isaac Bliss, Rayford Halsted, MD    Family History Family History  Problem Relation Age of Onset  Heart disease Mother     Social History Social History   Tobacco Use   Smoking status: Never Smoker   Smokeless tobacco: Never Used  Substance Use Topics   Alcohol use: No   Drug use: No    Types: Other-see comments     Allergies   Patient has no known allergies.   Review of Systems Review of Systems  Constitutional: Positive for fever. Negative for chills and diaphoresis.  HENT: Negative for rhinorrhea.   Eyes: Positive for visual disturbance. Negative for photophobia.  Respiratory: Negative for shortness of breath.   Cardiovascular: Negative for chest pain.  Gastrointestinal: Negative for abdominal pain, nausea and vomiting.  Genitourinary: Negative for flank pain.  Musculoskeletal: Negative for back pain.  Skin: Negative for pallor and wound.  Neurological: Positive for weakness, light-headedness and headaches. Negative for syncope, speech difficulty and numbness.     Physical Exam Updated Vital Signs BP  (!) 154/80    Pulse 79    Temp 98.5 F (36.9 C) (Oral)    Resp (!) 24    SpO2 95%   Physical Exam Vitals signs and nursing note reviewed.  Constitutional:      Appearance: Normal appearance. He is ill-appearing.  HENT:     Head: Normocephalic and atraumatic.     Mouth/Throat:     Mouth: Mucous membranes are dry.  Neck:     Musculoskeletal: Normal range of motion and neck supple.  Cardiovascular:     Rate and Rhythm: Normal rate.     Comments: No bilateral pitting edema. Pulmonary:     Effort: Pulmonary effort is normal.     Breath sounds: Normal breath sounds. No stridor. No rales.     Comments: No wheezing, rhonchi, rales. Abdominal:     General: Bowel sounds are normal. There is no distension.     Palpations: Abdomen is soft.     Tenderness: There is no abdominal tenderness. There is no right CVA tenderness or left CVA tenderness.     Hernia: A hernia is present.     Comments: Abdomen is soft, nontender to palpation.  Neurological:     Mental Status: He is alert.     Comments: No facial asymmetry, dysarthria. Does endorse somewhat weaker on his lower extremities.      ED Treatments / Results  Labs (all labs ordered are listed, but only abnormal results are displayed) Labs Reviewed  COMPREHENSIVE METABOLIC PANEL - Abnormal; Notable for the following components:      Result Value   Glucose, Bld 183 (*)    Creatinine, Ser 1.26 (*)    AST 48 (*)    GFR calc non Af Amer 54 (*)    All other components within normal limits  CBC - Abnormal; Notable for the following components:   RBC 3.96 (*)    All other components within normal limits  SARS CORONAVIRUS 2 (TAT 6-24 HRS)  CBG MONITORING, ED    EKG None  Radiology Ct Head Wo Contrast  Result Date: 03/25/2019 CLINICAL DATA:  Ct head wo, Pt's wife reports that Sunday morning he woke her up and was extremely disoriented and confused. Pt reported to wife he had a bad headache and temperature. EXAM: CT HEAD WITHOUT  CONTRAST TECHNIQUE: Contiguous axial images were obtained from the base of the skull through the vertex without intravenous contrast. COMPARISON:  None. FINDINGS: Brain: There are 3 areas of acute masslike hemorrhage in the bilateral occipital and left temporal lobes. The largest area  in the right occipital lobe measures 5.0 x 3.2 cm. A small area in the posterior left occipital lobe measures 1.6 by 1.8 cm. And another area in the left temporal lobe measures 1.4 by 1.1 cm. There is mass effect on the occipital hormone of the right lateral ventricle secondary to edema surrounding the hemorrhage in the posterior right occipital lobe. No midline shift. Mild enlargement of the ventricles is likely secondary to ex vacuo dilation from brain atrophy. Vascular: No hyperdense vessel or unexpected calcification. Skull: Normal. Negative for fracture or focal lesion. Sinuses/Orbits: Mucosal thickening in the bilateral maxillary and ethmoid sinuses. Other: None. IMPRESSION: Multiple areas of acute hemorrhage in the bilateral occipital and left temporal lobes. The largest area in the right posterior occipital lobe measures 5 x 3 cm with some mass effect on the right lateral ventricle. Differential considerations include hemorrhagic metastasis and emboli. Consider further evaluation with brain MRI. These results were called by telephone at the time of interpretation on 03/25/2019 at 6:55 pm to Dr. Janeece Fitting , who verbally acknowledged these results. Electronically Signed   By: Audie Pinto M.D.   On: 03/25/2019 19:05   Dg Chest Portable 1 View  Result Date: 03/25/2019 CLINICAL DATA:  Chest pain and shortness of breath. EXAM: PORTABLE CHEST 1 VIEW COMPARISON:  Chest x-ray dated June 13, 2011. FINDINGS: The heart size and mediastinal contours are within normal limits. Normal pulmonary vascularity. No focal consolidation, pleural effusion, or pneumothorax. Unchanged mild elevation of the left hemidiaphragm. No acute  osseous abnormality. IMPRESSION: No active disease. Electronically Signed   By: Titus Dubin M.D.   On: 03/25/2019 16:43    Procedures Procedures (including critical care time)  Medications Ordered in ED Medications  sodium chloride flush (NS) 0.9 % injection 3 mL (3 mLs Intravenous Not Given 03/25/19 1605)  sodium chloride 0.9 % bolus 500 mL (500 mLs Intravenous New Bag/Given 03/25/19 1833)  acetaminophen (TYLENOL) tablet 650 mg (650 mg Oral Given 03/25/19 1858)     Initial Impression / Assessment and Plan / ED Course  I have reviewed the triage vital signs and the nursing notes.  Pertinent labs & imaging results that were available during my care of the patient were reviewed by me and considered in my medical decision making (see chart for details).      Patient with a past medical history of hypertension presents to the ED with complaints of headache, dizziness all symptoms began Saturday, 3 days ago.  He reports feeling a sudden onset of headache which then caused him to be dizzy, unable to ambulate with a steady gait, was disoriented and could not recall his family's information.  Wife at the bedside is providing most of the history, patient reports since then he is felt unwell, he has been running a fever with a T-max of 100 while at home, he has been taking Advil for his pain with some improvement.  He was recently tested for COVID-19 this morning at a drive-through clinic, unknown results.  Referred to the ED to get further evaluated.  CMP revealed no electrolyte derangement, slight elevation in glucose at 183, does not have a previous history of diabetes.  Creatinine level is 1.26, which is consistent with patient's previous visit.  AST is slightly elevated.  CBC showed no leukocytosis, hemoglobin is within normal limits.  Imaging of his chest was obtained as he reported some shortness of breath along with increased fatigue, chest x-ray did not show any consolidation, pneumothorax,  pleural effusion.  CT of his head was ordered, although his neuro exam was intact.  CT head showed: Multiple areas of acute hemorrhage in the bilateral occipital and  left temporal lobes. The largest area in the right posterior  occipital lobe measures 5 x 3 cm with some mass effect on the right  lateral ventricle. Differential considerations include hemorrhagic  metastasis and emboli. Consider further evaluation with brain MRI.   Patient does voice photophobia, states this makes his symptoms worse.  Still has trouble answering questions, has somewhat delayed answers.  I have called neurology for further recommendations.  7:45 PM spoke to Dr. Lorraine Lax from neurology who will evaluate patient while in the ED.  Patient was given Tylenol for his headache.  Patient was also given a 500 cc bolus.  7:45 PM spoke to Dr. Lorraine Lax from neurology, he advised patient will likely need MRI brain, according to different mass lesions, they seem to be different in nature, differential diagnoses included but not limited to amyloid versus septic emboli.  He has been running a fever on Saturday with a T-max of 100.  We will now place call for hospitalist admission.  An MRI brain with and without contrast will also be obtained.  8:02 PM spoke to Dr. Maudie Mercury hospitalist who will admit patient at this time, MRI brain pending.  Portions of this note were generated with [employer]. Dictation errors may occur despite best attempts at proofreading.  Final Clinical Impressions(s) / ED Diagnoses   Final diagnoses:  Confusion  Dehydration  Acute cerebral hemorrhage [facility])    ED Discharge Orders    None       Janeece Fitting, Hershal Coria 03/25/19 Roselee Culver, MD 03/29/19 [phone]

## 2019-03-25 NOTE — ED Notes (Signed)
Patient transported to Ultrasound 

## 2019-03-25 NOTE — Telephone Encounter (Signed)
Copied from [place] [phone]. Topic: Appointment Scheduling - Scheduling Inquiry for Clinic >> Mar 25, 2019  9:50 AM Reyne Dumas L wrote: Reason for CRM:   Pt's wife calling.  Wants pt to be seen today - he is bumping into things.  Pt had a COVID test done this morning.  Advised by [facility] that pt must wait 14 days from test date to be seen.  Informed wife, stated that pt's life was in danger.  Called and spoke with [facility] again who states to advise pt to go to ER if life is in danger.  Advised wife who then put pt on the phone.  Both are very frustrated and express concerns that the doctor will not help them.

## 2019-03-25 NOTE — ED Triage Notes (Signed)
Pt's wife reports that Sunday morning he woke her up and was extremely disoriented and confused. Pt reported to wife he had a bad headache and temperature. Pt reports some improvement but still having some confusion towards things. Pt was also tested for COVID this morning.

## 2019-03-25 NOTE — ED Notes (Signed)
Patient transported to CT 

## 2019-03-25 NOTE — H&P (Signed)
TRH H&P    Patient Demographics:    Randall Mcgee, is a 78 y.o. male  MRN: [id number]  DOB - 1940-11-02  Admit Date - 03/25/2019  Referring MD/NP/PA: Janeece Fitting  Outpatient Primary MD for the patient is Isaac Bliss, Rayford Halsted, MD  Patient coming from: home  Chief complaint- headache   HPI:    Randall Mcgee  is a 78 y.o. male, w hypertension, hyperlipidemia, anxiety, remote history of melanoma on scalp 2017, apparently walking eradically since Sunday am.  Pt noted severe headache, back of the head "Saturday", took advil and the headache subsided.  Pt states that has memory loss, lack of concentration.  + weight loss, gradual.  No memory issues prior to this. Pt states had fever starting Sunday . T 100, typically at night.   In ED,  T 98.5, P 70 R 20, Bp 163/76  Pox 95% on RA  CT brain IMPRESSION: Multiple areas of acute hemorrhage in the bilateral occipital and left temporal lobes. The largest area in the right posterior occipital lobe measures 5 x 3 cm with some mass effect on the right lateral ventricle. Differential considerations include hemorrhagic metastasis and emboli. Consider further evaluation with brain MRI.  CXR IMPRESSION: No active disease.   Na 141, K 4.3, Bun 15, Creatinine 1.26 Ast 48, Alt 24, Alk phs 41, T bili 0.6 Wbc 7.0, Hgb 13.4, 201  Neurology consulted by ED, appreciate input  Pt will be admitted for multiple areas of acute intracranial hemorrhage r/o malignancy, amyloid   Review of systems:    In addition to the HPI above,    No problems swallowing food or Liquids, No Chest pain, Cough or Shortness of Breath, No Abdominal pain, No Nausea or Vomiting, bowel movements are regular, No Blood in stool or Urine, No dysuria, No new skin rashes or bruises, + right hip pain No new weakness, tingling, numbness in any extremity, No recent weight gain or loss, No  polyuria, polydypsia or polyphagia, No significant Mental Stressors.  All other systems reviewed and are negative.    Past History of the following :    Past Medical History:  Diagnosis Date   Anxiety    Cancer ([place]) 2017   melanoma on scalp   History of chicken pox    Hyperlipidemia    Hypertension    Right inguinal hernia       Past Surgical History:  Procedure Laterality Date   APPLICATION OF A-CELL OF EXTREMITY N/A 07/13/2016   Procedure: APPLICATION OF A-CELL;  Surgeon: Wallace Going, DO;  Location: [place];  Service: Plastics;  Laterality: N/A;   APPLICATION OF A-CELL OF HEAD/NECK N/A 06/28/2016   Procedure: APPLICATION OF A-CELL OF HEAD/NECK;  Surgeon: Wallace Going, DO;  Location: [place];  Service: Plastics;  Laterality: N/A;   APPLICATION OF A-CELL OF HEAD/NECK N/A 07/26/2016   Procedure: APPLICATION OF ACELL OF HEAD;  Surgeon: Wallace Going, DO;  Location: [place];  Service: Plastics;  Laterality: N/A;  APPLICATION OF A-CELL OF HEAD/NECK N/A 08/31/2016   Procedure: APPLICATION OF A-CELL OF HEAD/NECK;  Surgeon: Wallace Going, DO;  Location: [place];  Service: Plastics;  Laterality: N/A;   CHOLECYSTECTOMY     ENDARTERECTOMY  06/20/11   ENDARTERECTOMY  06/20/2011   Procedure: ENDARTERECTOMY CAROTID;  Surgeon: Elam Dutch, MD;  Location: [facility] OR;  Service: Vascular;  Laterality: Right;  Right Carotid endarterectomy with dacron patch angioplasty    INGUINAL HERNIA REPAIR Right 08/16/2018   Procedure: RIGHT INGUINAL HERNIA REPAIR WITH MESH;  Surgeon: Georganna Skeans, MD;  Location: [place];  Service: General;  Laterality: Right;   MASS EXCISION N/A 06/28/2016   Procedure: EXCISION SCALP MELANOMA;  Surgeon: Wallace Going, DO;  Location: [place];  Service: Plastics;  Laterality: N/A;   MASS EXCISION N/A 07/13/2016   Procedure:  RE- EXCISION OF SCALP MELANOMA;  Surgeon: Wallace Going, DO;  Location: [place];  Service: Plastics;  Laterality: N/A;   MASS EXCISION N/A 07/26/2016   Procedure: RE-EXCISION OF SCALP MELANOMA FOR POSITIVE MARGAIN;  Surgeon: Wallace Going, DO;  Location: [place];  Service: Plastics;  Laterality: N/A;   SKIN SPLIT GRAFT Left 08/31/2016   Procedure: SKIN GRAFT SPLIT THICKNESS TO HIS SCALP FROM HIS THIGH;  Surgeon: Wallace Going, DO;  Location: [place];  Service: Plastics;  Laterality: Left;   TONSILLECTOMY     TONSILLECTOMY        Social History:      Social History   Tobacco Use   Smoking status: Never Smoker   Smokeless tobacco: Never Used  Substance Use Topics   Alcohol use: No       Family History :     Family History  Problem Relation Age of Onset   Heart disease Mother        Home Medications:   Prior to Admission medications   Medication Sig Start Date End Date Taking? Authorizing Provider  ALPRAZolam (XANAX) 0.5 MG tablet TAKE 1 TABLET BY MOUTH TWICE DAILY AS NEEDED FOR SLEEP Patient taking differently: Take 0.5 mg by mouth See admin instructions. Take 0.5 mg by mouth at bedtime and an additional 0.5 mg once a day as needed for anxiety 02/04/19  Yes Isaac Bliss, Rayford Halsted, MD  aspirin 81 MG tablet Take 81 mg by mouth daily.   Yes [provider]  cetirizine (ZYRTEC) 10 MG tablet Take 1 tablet (10 mg total) by mouth daily. Patient taking differently: Take 10 mg by mouth at bedtime.  06/19/18  Yes Isaac Bliss, Rayford Halsted, MD  Cyanocobalamin (VITAMIN B-12 PO) Take 1,000 mg by mouth daily.   Yes [provider]  ibuprofen (ADVIL) 200 MG tablet Take 200 mg by mouth every 6 (six) hours as needed for headache or mild pain.   Yes [provider]  indomethacin (INDOCIN) 25 MG capsule TAKE 1 CAPSULE(25 MG) BY MOUTH THREE TIMES DAILY AS NEEDED Patient taking  differently: Take 25 mg by mouth See admin instructions. Take 25 mg by mouth three times a week as needed for gout flares 02/20/19  Yes Isaac Bliss, Rayford Halsted, MD  lisinopril (PRINIVIL,ZESTRIL) 40 MG tablet Take 1 tablet (40 mg total) by mouth daily. 09/19/18  Yes Isaac Bliss, Rayford Halsted, MD  Multiple Vitamins-Minerals (CENTRUM SILVER PO) Take 1 tablet by mouth daily.    Yes [provider]  propranolol (INDERAL) 20 MG tablet Take 40 mg  by mouth 2 (two) times daily.   Yes [provider]  simvastatin (ZOCOR) 40 MG tablet Take 1 tablet (40 mg total) by mouth every evening. Patient taking differently: Take 40 mg by mouth at bedtime.  09/17/18  Yes Isaac Bliss, Rayford Halsted, MD  fluorouracil (EFUDEX) 5 % cream Apply 1 application topically.  03/12/19   [provider]  propranolol (INDERAL) 40 MG tablet Take 1 tablet (40 mg total) by mouth 2 (two) times daily. TAKE 1 TABLET BY MOUTH TWICE DAILY( REPLACES NADOLOL) Patient not taking: Reported on 03/25/2019 02/19/19   Isaac Bliss, Rayford Halsted, MD  sildenafil (REVATIO) 20 MG tablet TAKE 2 TABLETS BY MOUTH DAILY AS DIRECTED Patient not taking: Reported on 03/25/2019 06/19/18   Isaac Bliss, Rayford Halsted, MD     Allergies:    No Known Allergies   Physical Exam:   Vitals  Blood pressure 137/80, pulse 74, temperature 98.5 F (36.9 C), temperature source Oral, resp. rate 19, SpO2 95 %.  1.  General: axoxo3  2. Psychiatric: euthymic  3. Neurologic: Vision appears impaired in the left eye,  Reflexes 2+ symmetric, diffuse with downgoing toes bilaterally, motor 5/5 in all 4 ext  4. HEENMT:  Anicteric, pupils 1.77m symmetric, direct, consensual, near intact Neck: no jvd  5. Respiratory : CTAB  6. Cardiovascular : rrr s1, s2, no m/g/r  7. Gastrointestinal:  Abd: soft, obese, nt, nd, +bs  8. Skin:  Ext no c/c/e, hammer toes, onychomycosis,   9.Musculoskeletal:  Good ROM,  Pt points to pain in right hip  joint area    Data Review:    CBC Recent Labs  Lab 03/25/19 1354  WBC 7.0  HGB 13.4  HCT 39.6  PLT 201  MCV 100.0  MCH 33.8  MCHC 33.8  RDW 12.6   ------------------------------------------------------------------------------------------------------------------  Results for orders placed or performed during the hospital encounter of 03/25/19 (from the past 48 hour(s))  Comprehensive metabolic panel     Status: Abnormal   Collection Time: 03/25/19  1:54 PM  Result Value Ref Range   Sodium 141 135 - 145 mmol/L   Potassium 4.3 3.5 - 5.1 mmol/L   Chloride 106 98 - 111 mmol/L   CO2 26 22 - 32 mmol/L   Glucose, Bld 183 (H) 70 - 99 mg/dL   BUN 15 8 - 23 mg/dL   Creatinine, Ser 1.26 (H) 0.61 - 1.24 mg/dL   Calcium 9.7 8.9 - 10.3 mg/dL   Total Protein 6.8 6.5 - 8.1 g/dL   Albumin 3.8 3.5 - 5.0 g/dL   AST 48 (H) 15 - 41 U/L   ALT 24 0 - 44 U/L   Alkaline Phosphatase 41 38 - 126 U/L   Total Bilirubin 0.6 0.3 - 1.2 mg/dL   GFR calc non Af Amer 54 (L) >60 mL/min   GFR calc Af Amer >60 >60 mL/min   Anion gap 9 5 - 15    Comment: Performed at [facility] [address], [place] [id number] CBC     Status: Abnormal   Collection Time: 03/25/19  1:54 PM  Result Value Ref Range   WBC 7.0 4.0 - 10.5 K/uL   RBC 3.96 (L) 4.22 - 5.81 MIL/uL   Hemoglobin 13.4 13.0 - 17.0 g/dL   HCT 39.6 39.0 - 52.0 %   MCV 100.0 80.0 - 100.0 fL   MCH 33.8 26.0 - 34.0 pg   MCHC 33.8 30.0 - 36.0 g/dL   RDW  12.6 11.5 - 15.5 %   Platelets 201 150 - 400 K/uL   nRBC 0.0 0.0 - 0.2 %    Comment: Performed at [facility], [place] [address] [place], [place] [address]    Chemistries  Recent Labs  Lab 03/25/19 1354  NA 141  K 4.3  CL 106  CO2 26  GLUCOSE 183*  BUN 15  CREATININE 1.26*  CALCIUM 9.7  AST 48*  ALT 24  ALKPHOS 41  BILITOT 0.6    ------------------------------------------------------------------------------------------------------------------  ------------------------------------------------------------------------------------------------------------------ GFR: CrCl cannot be calculated (Unknown ideal weight.). Liver Function Tests: Recent Labs  Lab 03/25/19 1354  AST 48*  ALT 24  ALKPHOS 41  BILITOT 0.6  PROT 6.8  ALBUMIN 3.8   No results for input(s): LIPASE, AMYLASE in the last 168 hours. No results for input(s): AMMONIA in the last 168 hours. Coagulation Profile: No results for input(s): INR, PROTIME in the last 168 hours. Cardiac Enzymes: No results for input(s): CKTOTAL, CKMB, CKMBINDEX, TROPONINI in the last 168 hours. BNP (last 3 results) No results for input(s): PROBNP in the last 8760 hours. HbA1C: No results for input(s): HGBA1C in the last 72 hours. CBG: No results for input(s): GLUCAP in the last 168 hours. Lipid Profile: No results for input(s): CHOL, HDL, LDLCALC, TRIG, CHOLHDL, LDLDIRECT in the last 72 hours. Thyroid Function Tests: No results for input(s): TSH, T4TOTAL, FREET4, T3FREE, THYROIDAB in the last 72 hours. Anemia Panel: No results for input(s): VITAMINB12, FOLATE, FERRITIN, TIBC, IRON, RETICCTPCT in the last 72 hours.  --------------------------------------------------------------------------------------------------------------- Urine analysis:    Component Value Date/Time   COLORURINE YELLOW 06/13/2011 [place] 06/13/2011 1201   LABSPEC 1.010 06/13/2011 1201   PHURINE 6.5 06/13/2011 1201   GLUCOSEU NEGATIVE 06/13/2011 1201   HGBUR NEGATIVE 06/13/2011 1201   BILIRUBINUR n 07/20/2015 0931   KETONESUR NEGATIVE 06/13/2011 1201   PROTEINUR n 07/20/2015 0931   PROTEINUR NEGATIVE 06/13/2011 1201   UROBILINOGEN 0.2 07/20/2015 0931   UROBILINOGEN 1.0 06/13/2011 1201   NITRITE n 07/20/2015 0931   NITRITE NEGATIVE 06/13/2011 1201   LEUKOCYTESUR  Negative 07/20/2015 0931      Imaging Results:    Ct Head Wo Contrast  Result Date: 03/25/2019 CLINICAL DATA:  Ct head wo, Pt's wife reports that Sunday morning he woke her up and was extremely disoriented and confused. Pt reported to wife he had a bad headache and temperature. EXAM: CT HEAD WITHOUT CONTRAST TECHNIQUE: Contiguous axial images were obtained from the base of the skull through the vertex without intravenous contrast. COMPARISON:  None. FINDINGS: Brain: There are 3 areas of acute masslike hemorrhage in the bilateral occipital and left temporal lobes. The largest area in the right occipital lobe measures 5.0 x 3.2 cm. A small area in the posterior left occipital lobe measures 1.6 by 1.8 cm. And another area in the left temporal lobe measures 1.4 by 1.1 cm. There is mass effect on the occipital hormone of the right lateral ventricle secondary to edema surrounding the hemorrhage in the posterior right occipital lobe. No midline shift. Mild enlargement of the ventricles is likely secondary to ex vacuo dilation from brain atrophy. Vascular: No hyperdense vessel or unexpected calcification. Skull: Normal. Negative for fracture or focal lesion. Sinuses/Orbits: Mucosal thickening in the bilateral maxillary and ethmoid sinuses. Other: None. IMPRESSION: Multiple areas of acute hemorrhage in the bilateral occipital and left temporal lobes. The largest area in the right posterior occipital lobe measures 5 x 3 cm with some  mass effect on the right lateral ventricle. Differential considerations include hemorrhagic metastasis and emboli. Consider further evaluation with brain MRI. These results were called by telephone at the time of interpretation on 03/25/2019 at 6:55 pm to Dr. Janeece Fitting , who verbally acknowledged these results. Electronically Signed   By: Audie Pinto M.D.   On: 03/25/2019 19:05   Dg Chest Portable 1 View  Result Date: 03/25/2019 CLINICAL DATA:  Chest pain and shortness of breath.  EXAM: PORTABLE CHEST 1 VIEW COMPARISON:  Chest x-ray dated June 13, 2011. FINDINGS: The heart size and mediastinal contours are within normal limits. Normal pulmonary vascularity. No focal consolidation, pleural effusion, or pneumothorax. Unchanged mild elevation of the left hemidiaphragm. No acute osseous abnormality. IMPRESSION: No active disease. Electronically Signed   By: Titus Dubin M.D.   On: 03/25/2019 16:43   ekg nsr at 72, LAD, incomplete LBBB   Assessment & Plan:    Principal Problem:   ICH (intracerebral hemorrhage) ([place]) Active Problems:   Hypertension   Hypercholesterolemia   Impaired glucose tolerance  Intracranial hemorrhage MRI brain w and w/o contrast NPO STOP aspirin STOP Advil STOP Indomethacin Neurology consulted, appreciate input  Hypertension Cont Propranolol 68m po bid Cont Lisinopril 444mpo qday  Hyperlipidemia Cont Zocor 4053mo qhs  Glucose intolerance  Check hga1c Consider FSBS, and ISS if >6.5  Abnormal liver function Check acute hepatitis panel Check RUQ ultrasound  Mild ARF Check renal ultrasound Hydrate with ns iv Check cmp in am  Fever Check esr Check blood culture x2 Check cardiac echo  DVT Prophylaxis-  SCDs   AM Labs Ordered, also please review Full Orders  Family Communication: Admission, patients condition and plan of care including tests being ordered have been discussed with the patient  who indicate understanding and agree with the plan and Code Status.  Code Status:  FULL CODE, wife present with patient  Admission status: Inpatient: Based on patients clinical presentation and evaluation of above clinical data, I have made determination that patient meets Inpatient criteria at this time. Pt will require close observation of intracranial hemorrhage, and has high risk of deterioration due to ICHRobbinsPt wil require w/up of complaints of fever, and close follow up of blood cultures to r/o endocarditis and septic  emboli.   Time spent in minutes :  70   JamJani GravelD on 03/25/2019 at 8:19 PM

## 2019-03-25 NOTE — Telephone Encounter (Signed)
Patient is has arrived to ED

## 2019-03-25 NOTE — Telephone Encounter (Signed)
Noted. FYI 

## 2019-03-25 NOTE — Telephone Encounter (Signed)
I spoke with Dr. Jerilee Hoh to advise her of the situation, as she was not in the office yesterday. She agrees that if pt is continuing to decline he needs to be evaluated in the ED as soon as possible. We are providing appropriate recommendations for medical evaluation and treatment, it is up to the patient to follow those.   Roselyn Reef - in case you would like to triage or speak with pt.   [place]

## 2019-03-25 NOTE — Telephone Encounter (Signed)
Noted will monitor for ED arrival since its noted PCP agrees that it is appropriate for patient to go to ED for further evaluation.

## 2019-03-25 NOTE — ED Notes (Signed)
Pts son, Jerimyah Wildermuth, phone number-[phone]

## 2019-03-26 ENCOUNTER — Inpatient Hospital Stay (HOSPITAL_COMMUNITY): Payer: Medicare Other

## 2019-03-26 ENCOUNTER — Ambulatory Visit
Admission: RE | Admit: 2019-03-26 | Discharge: 2019-03-26 | Disposition: A | Discharge status: Home / Self Care | Payer: Medicare Other | Source: Ambulatory Visit | Attending: Radiation Oncology | Admitting: Radiation Oncology

## 2019-03-26 ENCOUNTER — Encounter (HOSPITAL_COMMUNITY): Payer: Self-pay | Admitting: *Deleted

## 2019-03-26 DIAGNOSIS — C439 Malignant melanoma of skin, unspecified: Secondary | ICD-10-CM | POA: Insufficient documentation

## 2019-03-26 DIAGNOSIS — C7931 Secondary malignant neoplasm of brain: Secondary | ICD-10-CM | POA: Insufficient documentation

## 2019-03-26 DIAGNOSIS — Z510 Encounter for antineoplastic radiation therapy: Secondary | ICD-10-CM | POA: Insufficient documentation

## 2019-03-26 DIAGNOSIS — I612 Nontraumatic intracerebral hemorrhage in hemisphere, unspecified: Secondary | ICD-10-CM

## 2019-03-26 LAB — TROPONIN I (HIGH SENSITIVITY): Troponin I (High Sensitivity): 21 ng/L — ABNORMAL HIGH (ref <18)

## 2019-03-26 LAB — NOVEL CORONAVIRUS, NAA: SARS-CoV-2, NAA: NOT DETECTED

## 2019-03-26 LAB — SARS CORONAVIRUS 2 (TAT 6-24 HRS): SARS Coronavirus 2: NEGATIVE

## 2019-03-26 MED ORDER — ACETAMINOPHEN 325 MG PO TABS
650.0000 mg | ORAL_TABLET | Freq: Four times a day (QID) | ORAL | Status: DC | PRN
  Administered 2019-03-26 – 2019-03-27 (×2): 650 mg via ORAL
  Filled 2019-03-26 (×2): qty 2

## 2019-03-26 MED ORDER — LORATADINE 10 MG PO TABS
10.0000 mg | ORAL_TABLET | Freq: Every day | ORAL | Status: DC
  Administered 2019-03-26 – 2019-04-05 (×12): 10 mg via ORAL
  Filled 2019-03-26 (×12): qty 1

## 2019-03-26 MED ORDER — ALPRAZOLAM 0.5 MG PO TABS
0.5000 mg | ORAL_TABLET | Freq: Every day | ORAL | Status: DC | PRN
  Administered 2019-03-27: 0.5 mg via ORAL

## 2019-03-26 MED ORDER — ALPRAZOLAM 0.5 MG PO TABS
0.5000 mg | ORAL_TABLET | Freq: Every day | ORAL | Status: DC
  Administered 2019-03-26 – 2019-04-04 (×10): 0.5 mg via ORAL
  Filled 2019-03-26 (×11): qty 1

## 2019-03-26 MED ORDER — SIMVASTATIN 20 MG PO TABS: 40.0000 mg | ORAL_TABLET | Freq: Every day | ORAL | Status: DC

## 2019-03-26 MED ORDER — GADOBUTROL 1 MMOL/ML IV SOLN
10.0000 mL | Freq: Once | INTRAVENOUS | Status: AC | PRN
  Administered 2019-03-26: 10 mL via INTRAVENOUS

## 2019-03-26 MED ORDER — PROPRANOLOL HCL 40 MG PO TABS
40.0000 mg | ORAL_TABLET | Freq: Two times a day (BID) | ORAL | Status: DC
  Administered 2019-03-26 – 2019-04-05 (×19): 40 mg via ORAL
  Filled 2019-03-26 (×24): qty 1

## 2019-03-26 MED ORDER — AMLODIPINE BESYLATE 5 MG PO TABS
5.0000 mg | ORAL_TABLET | Freq: Every day | ORAL | Status: DC
  Administered 2019-03-26 – 2019-04-05 (×11): 5 mg via ORAL
  Filled 2019-03-26 (×11): qty 1

## 2019-03-26 MED ORDER — IOPAMIDOL (ISOVUE-300) INJECTION 61%
100.0000 mL | Freq: Once | INTRAVENOUS | Status: AC | PRN
  Administered 2019-03-26: 14:00:00 100 mL via INTRAVENOUS

## 2019-03-26 MED ORDER — DEXAMETHASONE SODIUM PHOSPHATE 4 MG/ML IJ SOLN
4.0000 mg | Freq: Four times a day (QID) | INTRAMUSCULAR | Status: DC
  Administered 2019-03-26 – 2019-03-29 (×12): 4 mg via INTRAVENOUS
  Filled 2019-03-26 (×12): qty 1

## 2019-03-26 MED ORDER — LISINOPRIL 20 MG PO TABS
40.0000 mg | ORAL_TABLET | Freq: Every day | ORAL | Status: DC
  Administered 2019-03-26 – 2019-04-05 (×12): 40 mg via ORAL
  Filled 2019-03-26 (×13): qty 2

## 2019-03-26 MED ORDER — ALPRAZOLAM 0.5 MG PO TABS
0.5000 mg | ORAL_TABLET | ORAL | Status: DC
  Administered 2019-03-26: 0.5 mg via ORAL
  Filled 2019-03-26: qty 2

## 2019-03-26 MED ORDER — TRAMADOL HCL 50 MG PO TABS
50.0000 mg | ORAL_TABLET | Freq: Four times a day (QID) | ORAL | Status: DC | PRN
  Administered 2019-03-26: 50 mg via ORAL
  Filled 2019-03-26: qty 1

## 2019-03-26 MED ORDER — ATORVASTATIN CALCIUM 10 MG PO TABS
20.0000 mg | ORAL_TABLET | Freq: Every day | ORAL | Status: DC
  Administered 2019-03-26: 18:00:00 20 mg via ORAL
  Filled 2019-03-26: qty 2

## 2019-03-26 NOTE — ED Notes (Signed)
Patient transported to MRI 

## 2019-03-26 NOTE — Consult Note (Addendum)
[place]  [phone] [phone]   MEDICAL ONCOLOGY - INITIAL CONSULTATION  Referral MD: Dr. Barb Merino  Reason for Referral: Brain lesions, history of melanoma of the scalp  HPI: Mr. Hellyer there is an 78 year old male with a past medical history significant for hypertension, hyperlipidemia, anxiety, and history of scalp melanoma status post excision in 2017.  According to outpatient records from [facility], the patient was diagnosed with melanoma in situ of the scalp and underwent excision on 06/28/2016 and reexcision on 07/13/2016 for positive margins and again on 07/26/2016 for positive margins.  The pathology from the excision on 07/26/2016 showed no residual melanoma.  His original diagnosis of scalp melanoma was 5 years prior and he was treated with Mohs excision.  The lesion that was excised in 2017 was a new lesion at the scar from the original Mohs surgery.  The patient has also had a work-up by Dr. Grandville Silos from general surgery with negative lymph nodes.  The patient came to the emergency room with a history of 1 to 2 weeks of stumbling and losing his balance, not feeling well, low-grade fever for several days, loss of concentration and memory.  Work-up in the emergency room included a CT of the brain which showed multiple areas of acute hemorrhage in the bilateral occipital and left temporal lobes, the largest area in the right posterior occipital lobe measures 5 x 3 cm with some mass-effect on the right lateral ventricle.  An MRI of the brain with and without contrast showed multiple scattered intracranial hemorrhagic metastases with 1 of the lesions in the parieto-occipital region which has bled with associated 34 cm intraparenchymal hematoma, associated regional mass-effect with trace right to left midline shift.  Neurology has been consulted who recommended holding aspirin/anticoagulation and oncology consult.  When seen today, patient reports ongoing  issues with his memory.  He stated that he had a severe headache in the occipital region of his head over the weekend.  He took Advil and his pain resolved.  Patient states that he has had some visual deficits particularly on the left side.  He has had some difficulty with spatial relations and has walked into his doorway several times when trying to go into his bathroom.  He reports that he had some intermittent fevers at home up to 100 but denies chills.  Denies night sweats.  He denies anorexia and unintentional weight loss.  He states that he has changed his diet recently and has lost some weight due to this.  He denies chest discomfort, shortness of breath, cough, hemoptysis.  Denies abdominal pain, nausea, vomiting, constipation, diarrhea.  Denies palpable lymphadenopathy in skin lesions.  The patient reports a remote history of smoking greater than 50 years ago.  He stated that he smokes socially.  Denies alcohol use in over 23 years.  The patient is married and has 3 grown children.  The patient has a family history of heart disease in his mother.  Medical oncology was asked see the patient to make recommendations regarding his brain lesions and history of melanoma of the scalp.   Past Medical History:  Diagnosis Date  . Anxiety   . Cancer ([place]) 2017   melanoma on scalp  . History of chicken pox   . Hyperlipidemia   . Hypertension   . Right inguinal hernia   :  Past Surgical History:  Procedure Laterality Date  . APPLICATION OF A-CELL OF EXTREMITY N/A 07/13/2016   Procedure: APPLICATION OF  A-CELL;  Surgeon: Wallace Going, DO;  Location: [place];  Service: Plastics;  Laterality: N/A;  . APPLICATION OF A-CELL OF HEAD/NECK N/A 06/28/2016   Procedure: APPLICATION OF A-CELL OF HEAD/NECK;  Surgeon: Wallace Going, DO;  Location: [place];  Service: Plastics;  Laterality: N/A;  . APPLICATION OF A-CELL OF HEAD/NECK N/A 07/26/2016   Procedure:  APPLICATION OF ACELL OF HEAD;  Surgeon: Wallace Going, DO;  Location: [place];  Service: Plastics;  Laterality: N/A;  . APPLICATION OF A-CELL OF HEAD/NECK N/A 08/31/2016   Procedure: APPLICATION OF A-CELL OF HEAD/NECK;  Surgeon: Wallace Going, DO;  Location: [place];  Service: Plastics;  Laterality: N/A;  . CATARACT EXTRACTION, BILATERAL    . CHOLECYSTECTOMY    . ENDARTERECTOMY  06/20/11  . ENDARTERECTOMY  06/20/2011   Procedure: ENDARTERECTOMY CAROTID;  Surgeon: Elam Dutch, MD;  Location: [facility] OR;  Service: Vascular;  Laterality: Right;  Right Carotid endarterectomy with dacron patch angioplasty   . INGUINAL HERNIA REPAIR Right 08/16/2018   Procedure: RIGHT INGUINAL HERNIA REPAIR WITH MESH;  Surgeon: Georganna Skeans, MD;  Location: [place];  Service: General;  Laterality: Right;  . MASS EXCISION N/A 06/28/2016   Procedure: EXCISION SCALP MELANOMA;  Surgeon: Wallace Going, DO;  Location: [place];  Service: Plastics;  Laterality: N/A;  . MASS EXCISION N/A 07/13/2016   Procedure: RE- EXCISION OF SCALP MELANOMA;  Surgeon: Wallace Going, DO;  Location: [place];  Service: Plastics;  Laterality: N/A;  . MASS EXCISION N/A 07/26/2016   Procedure: RE-EXCISION OF SCALP MELANOMA FOR POSITIVE MARGAIN;  Surgeon: Wallace Going, DO;  Location: [place];  Service: Plastics;  Laterality: N/A;  . SKIN SPLIT GRAFT Left 08/31/2016   Procedure: SKIN GRAFT SPLIT THICKNESS TO HIS SCALP FROM HIS THIGH;  Surgeon: Wallace Going, DO;  Location: [place];  Service: Plastics;  Laterality: Left;  . TONSILLECTOMY    . TONSILLECTOMY    :  Current Facility-Administered Medications  Medication Dose Route Frequency Provider Last Rate Last Dose  . acetaminophen (TYLENOL) tablet 650 mg  650 mg Oral Q6H PRN Barb Merino, MD   650 mg at 03/26/19 1007  . ALPRAZolam Duanne Moron)  tablet 0.5 mg  0.5 mg Oral QHS Barb Merino, MD      . ALPRAZolam Duanne Moron) tablet 0.5 mg  0.5 mg Oral Daily PRN Barb Merino, MD      . amLODipine (NORVASC) tablet 5 mg  5 mg Oral Daily Barb Merino, MD   5 mg at 03/26/19 1007  . atorvastatin (LIPITOR) tablet 20 mg  20 mg Oral q1800 Barb Merino, MD      . dexamethasone (DECADRON) injection 4 mg  4 mg Intravenous Q6H Barb Merino, MD   4 mg at 03/26/19 1011  . lisinopril (ZESTRIL) tablet 40 mg  40 mg Oral Daily Jani Gravel, MD   40 mg at 03/26/19 1007  . loratadine (CLARITIN) tablet 10 mg  10 mg Oral Daily Jani Gravel, MD   10 mg at 03/26/19 1007  . propranolol (INDERAL) tablet 40 mg  40 mg Oral BID Jani Gravel, MD   40 mg at 03/26/19 1108  . sodium chloride flush (NS) 0.9 % injection 3 mL  3 mL Intravenous Once Charlesetta Shanks, MD      . traMADol Veatrice Bourbon) tablet 50 mg  50 mg Oral Q6H PRN Jani Gravel,  MD   50 mg at 03/26/19 0111     No Known Allergies:  Family History  Problem Relation Age of Onset  . Heart disease Mother   :  Social History   Socioeconomic History  . Marital status: Married    Spouse name: Not on file  . Number of children: Not on file  . Years of education: Not on file  . Highest education level: Not on file  Occupational History  . Not on file  Social Needs  . Financial resource strain: Not on file  . Food insecurity    Worry: Not on file    Inability: Not on file  . Transportation needs    Medical: Not on file    Non-medical: Not on file  Tobacco Use  . Smoking status: Never Smoker  . Smokeless tobacco: Never Used  Substance and Sexual Activity  . Alcohol use: No  . Drug use: No    Types: Other-see comments  . Sexual activity: Yes  Lifestyle  . Physical activity    Days per week: Not on file    Minutes per session: Not on file  . Stress: Not on file  Relationships  . Social Herbalist on phone: Not on file    Gets together: Not on file    Attends religious service: Not on file     Active member of club or organization: Not on file    Attends meetings of clubs or organizations: Not on file    Relationship status: Not on file  . Intimate partner violence    Fear of current or ex partner: Not on file    Emotionally abused: Not on file    Physically abused: Not on file    Forced sexual activity: Not on file  Other Topics Concern  . Not on file  Social History Narrative  . Not on file  :  Review of Systems: A comprehensive 14 point review of systems was negative except as noted in HPI.  Exam: Patient Vitals for the past 24 hrs:  BP Temp Temp src Pulse Resp SpO2  03/26/19 0730 (!) 178/88 98.7 F (37.1 C) Oral 77 18 95 %  03/26/19 0715 - - - 74 14 94 %  03/26/19 0700 (!) 150/80 - - - - -  03/26/19 0600 139/77 - - 66 17 96 %  03/26/19 0327 (!) 145/69 - - 76 13 96 %  03/26/19 0113 135/84 - - 95 18 99 %  03/25/19 2341 (!) 169/84 - - 74 18 98 %  03/25/19 2315 132/84 - - 72 - 94 %  03/25/19 2302 (!) 141/89 - - 87 19 96 %  03/25/19 2300 - - - 77 - 96 %  03/25/19 2145 (!) 161/79 - - 65 19 95 %  03/25/19 2115 (!) 156/75 - - 72 20 95 %  03/25/19 2110 (!) 162/83 - - 78 13 98 %  03/25/19 2030 (!) 163/82 - - 72 17 94 %  03/25/19 2016 137/80 - - 74 19 95 %  03/25/19 1945 (!) 169/90 - - 81 19 95 %  03/25/19 1941 (!) 154/80 - - 79 (!) 24 95 %  03/25/19 1845 (!) 152/84 - - 77 20 97 %  03/25/19 1838 (!) 181/82 - - 77 18 96 %  03/25/19 1800 (!) 170/81 - - 73 16 96 %  03/25/19 1730 (!) 162/78 - - 74 16 98 %  03/25/19 1700 (!) 149/80 - - 67  16 94 %  03/25/19 1645 (!) 159/79 - - 71 - 98 %  03/25/19 1617 (!) 173/83 - - 72 16 98 %  03/25/19 1615 (!) 152/125 - - 72 16 97 %  03/25/19 1344 (!) 163/76 98.5 F (36.9 C) Oral 70 20 95 %    General:  well-nourished in no acute distress.   Head: Has a well-healed skin graft to the top of his head.  No overlying nodularity or lesions noted. Eyes:  no scleral icterus.  EOMI.  PERRL. ENT:  There were no oropharyngeal lesions.    Neck was without thyromegaly.   Lymphatics:  Negative cervical, supraclavicular or axillary adenopathy.   Respiratory: lungs were clear bilaterally without wheezing or crackles.   Cardiovascular:  Regular rate and rhythm, S1/S2, without murmur, rub or gallop.  There was no pedal edema.   GI:  abdomen was soft, flat, nontender, nondistended, without organomegaly.  Muscoloskeletal:  no spinal tenderness of palpation of vertebral spine.   Skin exam was without echymosis, petichae.  Bruising noted to his left toes. Neuro exam: Patient was alert and oriented.  Attention was good.   Language was appropriate.  Mood was normal without depression.  Speech was not pressured.  Thought content was not tangential.  Left homonymous hemianopia noted.   Lab Results  Component Value Date   WBC 7.0 03/25/2019   HGB 13.4 03/25/2019   HCT 39.6 03/25/2019   PLT 201 03/25/2019   GLUCOSE 183 (H) 03/25/2019   CHOL 159 07/06/2017   TRIG 113.0 07/06/2017   HDL 43.40 07/06/2017   LDLCALC 93 07/06/2017   ALT 24 03/25/2019   AST 48 (H) 03/25/2019   NA 141 03/25/2019   K 4.3 03/25/2019   CL 106 03/25/2019   CREATININE 1.26 (H) 03/25/2019   BUN 15 03/25/2019   CO2 26 03/25/2019    Ct Head Wo Contrast  Result Date: 03/25/2019 CLINICAL DATA:  Ct head wo, Pt's wife reports that Sunday morning he woke her up and was extremely disoriented and confused. Pt reported to wife he had a bad headache and temperature. EXAM: CT HEAD WITHOUT CONTRAST TECHNIQUE: Contiguous axial images were obtained from the base of the skull through the vertex without intravenous contrast. COMPARISON:  None. FINDINGS: Brain: There are 3 areas of acute masslike hemorrhage in the bilateral occipital and left temporal lobes. The largest area in the right occipital lobe measures 5.0 x 3.2 cm. A small area in the posterior left occipital lobe measures 1.6 by 1.8 cm. And another area in the left temporal lobe measures 1.4 by 1.1 cm. There is mass  effect on the occipital hormone of the right lateral ventricle secondary to edema surrounding the hemorrhage in the posterior right occipital lobe. No midline shift. Mild enlargement of the ventricles is likely secondary to ex vacuo dilation from brain atrophy. Vascular: No hyperdense vessel or unexpected calcification. Skull: Normal. Negative for fracture or focal lesion. Sinuses/Orbits: Mucosal thickening in the bilateral maxillary and ethmoid sinuses. Other: None. IMPRESSION: Multiple areas of acute hemorrhage in the bilateral occipital and left temporal lobes. The largest area in the right posterior occipital lobe measures 5 x 3 cm with some mass effect on the right lateral ventricle. Differential considerations include hemorrhagic metastasis and emboli. Consider further evaluation with brain MRI. These results were called by telephone at the time of interpretation on 03/25/2019 at 6:55 pm to Dr. Janeece Fitting , who verbally acknowledged these results. Electronically Signed   By: Audie Pinto  M.D.   On: 03/25/2019 19:05   Mr Jeri Cos And Wo Contrast  Result Date: 03/26/2019 CLINICAL DATA:  Follow-up examination for acute intracranial hemorrhage. Altered mental status. EXAM: MRI HEAD WITHOUT AND WITH CONTRAST TECHNIQUE: Multiplanar, multiecho pulse sequences of the brain and surrounding structures were obtained without and with intravenous contrast. CONTRAST:  10 cc of Gadavist. COMPARISON:  Prior CT from 03/25/2019. FINDINGS: Brain: Mild age-related cerebral atrophy with chronic microvascular ischemic disease. Few scattered superimposed remote lacunar infarcts noted within the right basal ganglia and left thalamus. Previously identified intraparenchymal hematoma centered at the right parieto-occipital region again seen, measuring approximately 4.1 x 4.3 x 3.9 cm (estimated volume 34 cc). Underlying metastatic lesion is presumably present. Surrounding vasogenic edema throughout the right parieto-occipital  region with mild mass effect on the adjacent atrium of the right lateral ventricle. No definite intraventricular extension of hemorrhage. Associated trace right-to-left midline shift of the septum pellucidum. Basilar cisterns remain patent. Multiple additional scattered enhancing masses seen involving the bilateral cerebral hemispheres, all of which demonstrate associated susceptibility artifact, consistent with hemorrhagic metastases. These are seen as follows; punctate enhancing lesion at the posterior left centrum semi ovale (series 18, image 45), 8 mm enhancing lesion at the right parietal cortex (series 18, image 43), 5 mm lesion involving the cortical gray matter of the anterior left frontal lobe (series 18, image 42), 7 mm lesion along the cortex of the anterior right frontal lobe (series 18, image 34), 11 mm dural-based lesion at the right occipital lobe (series 18, image 28), adjacent punctate 4 mm lesion (series 18, image 25), 14 mm lesion at the anterior left temporal lobe (series 18, image 25), 20 x 19 mm dural-based lesion at the left occipital lobe (series 18, image 23). Additional possible punctate lesion at the anterior temporal left temporal pole (series 18, image 18), not entirely certain given small size. Relative sparing of the brainstem and cerebellum. No evidence for acute infarct. No extra-axial fluid collection. No hydrocephalus or ventricular trapping. No other acute intracranial abnormality. Vascular: Major intracranial vascular flow voids are maintained. Skull and upper cervical spine: Craniocervical junction within normal limits. Degenerative spondylolysis noted at C2-3 without significant stenosis. No focal marrow replacing lesion. Postsurgical changes noted at the left frontal scalp. Sinuses/Orbits: Patient status post bilateral ocular lens replacement. Globes and orbital soft tissues demonstrate no acute finding. Scattered mucosal thickening noted within the ethmoidal air cells and  maxillary sinuses with superimposed small maxillary sinus retention cyst. No mastoid effusion. Inner ear structures grossly normal. Other: None. IMPRESSION: 1. Multiple scattered intracranial hemorrhagic metastases as detailed above. One of these lesions at the parieto-occipital region has bled with associated 34 cc intraparenchymal hematoma. Associated regional mass effect with trace right-to-left midline shift. 2. Underlying age-related cerebral atrophy with mild chronic small vessel ischemic disease. Electronically Signed   By: Jeannine Boga M.D.   On: 03/26/2019 03:13   US Abdomen Complete  Result Date: 03/25/2019 CLINICAL DATA:  Abnormal LFTs. EXAM: ABDOMEN ULTRASOUND COMPLETE COMPARISON:  None. FINDINGS: Gallbladder: The gallbladder surgically absent. Common bile duct: Diameter: 3 mm Liver: The liver echogenicity appears increased. The hepatic echotexture is coarsened and heterogeneous. Portal vein is patent on color Doppler imaging with normal direction of blood flow towards the liver. IVC: No abnormality visualized. Pancreas: The pancreas is poorly evaluated secondary to overlying bowel gas. Spleen: Size and appearance within normal limits. Right Kidney: Length: 9.3 cm. Echogenicity within normal limits. No mass or hydronephrosis visualized. Left Kidney: Length: 11 cm.  There is a hypoechoic 3.3 x 2.9 by 3 cm mass in the interpolar region of the left kidney. This mass appears to demonstrate some internal color Doppler flow. There is no hydronephrosis. Abdominal aorta: No aneurysm visualized. Other findings: None. IMPRESSION: 1. Limited study secondary to overlying bowel gas and patient body habitus. 2. Status post cholecystectomy. 3. Coarsened heterogeneous appearance of the liver is nonspecific and may be secondary to underlying hepatocellular disease or hepatic steatosis. 4. Indeterminate 3 cm mass involving the interpolar region of the left kidney. Follow-up with a nonemergent outpatient contrast  enhanced CT or MRI is recommended for further evaluation of this finding. 5. Pancreas not well evaluated secondary to overlying bowel gas. Electronically Signed   By: Constance Holster M.D.   On: 03/25/2019 23:10   Dg Chest Portable 1 View  Result Date: 03/25/2019 CLINICAL DATA:  Chest pain and shortness of breath. EXAM: PORTABLE CHEST 1 VIEW COMPARISON:  Chest x-ray dated June 13, 2011. FINDINGS: The heart size and mediastinal contours are within normal limits. Normal pulmonary vascularity. No focal consolidation, pleural effusion, or pneumothorax. Unchanged mild elevation of the left hemidiaphragm. No acute osseous abnormality. IMPRESSION: No active disease. Electronically Signed   By: Titus Dubin M.D.   On: 03/25/2019 16:43    Ct Head Wo Contrast  Result Date: 03/25/2019 CLINICAL DATA:  Ct head wo, Pt's wife reports that Sunday morning he woke her up and was extremely disoriented and confused. Pt reported to wife he had a bad headache and temperature. EXAM: CT HEAD WITHOUT CONTRAST TECHNIQUE: Contiguous axial images were obtained from the base of the skull through the vertex without intravenous contrast. COMPARISON:  None. FINDINGS: Brain: There are 3 areas of acute masslike hemorrhage in the bilateral occipital and left temporal lobes. The largest area in the right occipital lobe measures 5.0 x 3.2 cm. A small area in the posterior left occipital lobe measures 1.6 by 1.8 cm. And another area in the left temporal lobe measures 1.4 by 1.1 cm. There is mass effect on the occipital hormone of the right lateral ventricle secondary to edema surrounding the hemorrhage in the posterior right occipital lobe. No midline shift. Mild enlargement of the ventricles is likely secondary to ex vacuo dilation from brain atrophy. Vascular: No hyperdense vessel or unexpected calcification. Skull: Normal. Negative for fracture or focal lesion. Sinuses/Orbits: Mucosal thickening in the bilateral maxillary and ethmoid  sinuses. Other: None. IMPRESSION: Multiple areas of acute hemorrhage in the bilateral occipital and left temporal lobes. The largest area in the right posterior occipital lobe measures 5 x 3 cm with some mass effect on the right lateral ventricle. Differential considerations include hemorrhagic metastasis and emboli. Consider further evaluation with brain MRI. These results were called by telephone at the time of interpretation on 03/25/2019 at 6:55 pm to Dr. Janeece Fitting , who verbally acknowledged these results. Electronically Signed   By: Audie Pinto M.D.   On: 03/25/2019 19:05   Mr Brain W And Wo Contrast  Result Date: 03/26/2019 CLINICAL DATA:  Follow-up examination for acute intracranial hemorrhage. Altered mental status. EXAM: MRI HEAD WITHOUT AND WITH CONTRAST TECHNIQUE: Multiplanar, multiecho pulse sequences of the brain and surrounding structures were obtained without and with intravenous contrast. CONTRAST:  10 cc of Gadavist. COMPARISON:  Prior CT from 03/25/2019. FINDINGS: Brain: Mild age-related cerebral atrophy with chronic microvascular ischemic disease. Few scattered superimposed remote lacunar infarcts noted within the right basal ganglia and left thalamus. Previously identified intraparenchymal hematoma centered at the  right parieto-occipital region again seen, measuring approximately 4.1 x 4.3 x 3.9 cm (estimated volume 34 cc). Underlying metastatic lesion is presumably present. Surrounding vasogenic edema throughout the right parieto-occipital region with mild mass effect on the adjacent atrium of the right lateral ventricle. No definite intraventricular extension of hemorrhage. Associated trace right-to-left midline shift of the septum pellucidum. Basilar cisterns remain patent. Multiple additional scattered enhancing masses seen involving the bilateral cerebral hemispheres, all of which demonstrate associated susceptibility artifact, consistent with hemorrhagic metastases. These are seen  as follows; punctate enhancing lesion at the posterior left centrum semi ovale (series 18, image 45), 8 mm enhancing lesion at the right parietal cortex (series 18, image 43), 5 mm lesion involving the cortical gray matter of the anterior left frontal lobe (series 18, image 42), 7 mm lesion along the cortex of the anterior right frontal lobe (series 18, image 34), 11 mm dural-based lesion at the right occipital lobe (series 18, image 28), adjacent punctate 4 mm lesion (series 18, image 25), 14 mm lesion at the anterior left temporal lobe (series 18, image 25), 20 x 19 mm dural-based lesion at the left occipital lobe (series 18, image 23). Additional possible punctate lesion at the anterior temporal left temporal pole (series 18, image 18), not entirely certain given small size. Relative sparing of the brainstem and cerebellum. No evidence for acute infarct. No extra-axial fluid collection. No hydrocephalus or ventricular trapping. No other acute intracranial abnormality. Vascular: Major intracranial vascular flow voids are maintained. Skull and upper cervical spine: Craniocervical junction within normal limits. Degenerative spondylolysis noted at C2-3 without significant stenosis. No focal marrow replacing lesion. Postsurgical changes noted at the left frontal scalp. Sinuses/Orbits: Patient status post bilateral ocular lens replacement. Globes and orbital soft tissues demonstrate no acute finding. Scattered mucosal thickening noted within the ethmoidal air cells and maxillary sinuses with superimposed small maxillary sinus retention cyst. No mastoid effusion. Inner ear structures grossly normal. Other: None. IMPRESSION: 1. Multiple scattered intracranial hemorrhagic metastases as detailed above. One of these lesions at the parieto-occipital region has bled with associated 34 cc intraparenchymal hematoma. Associated regional mass effect with trace right-to-left midline shift. 2. Underlying age-related cerebral  atrophy with mild chronic small vessel ischemic disease. Electronically Signed   By: Jeannine Boga M.D.   On: 03/26/2019 03:13   US Abdomen Complete  Result Date: 03/25/2019 CLINICAL DATA:  Abnormal LFTs. EXAM: ABDOMEN ULTRASOUND COMPLETE COMPARISON:  None. FINDINGS: Gallbladder: The gallbladder surgically absent. Common bile duct: Diameter: 3 mm Liver: The liver echogenicity appears increased. The hepatic echotexture is coarsened and heterogeneous. Portal vein is patent on color Doppler imaging with normal direction of blood flow towards the liver. IVC: No abnormality visualized. Pancreas: The pancreas is poorly evaluated secondary to overlying bowel gas. Spleen: Size and appearance within normal limits. Right Kidney: Length: 9.3 cm. Echogenicity within normal limits. No mass or hydronephrosis visualized. Left Kidney: Length: 11 cm. There is a hypoechoic 3.3 x 2.9 by 3 cm mass in the interpolar region of the left kidney. This mass appears to demonstrate some internal color Doppler flow. There is no hydronephrosis. Abdominal aorta: No aneurysm visualized. Other findings: None. IMPRESSION: 1. Limited study secondary to overlying bowel gas and patient body habitus. 2. Status post cholecystectomy. 3. Coarsened heterogeneous appearance of the liver is nonspecific and may be secondary to underlying hepatocellular disease or hepatic steatosis. 4. Indeterminate 3 cm mass involving the interpolar region of the left kidney. Follow-up with a nonemergent outpatient contrast enhanced CT or  MRI is recommended for further evaluation of this finding. 5. Pancreas not well evaluated secondary to overlying bowel gas. Electronically Signed   By: Constance Holster M.D.   On: 03/25/2019 23:10   Dg Chest Portable 1 View  Result Date: 03/25/2019 CLINICAL DATA:  Chest pain and shortness of breath. EXAM: PORTABLE CHEST 1 VIEW COMPARISON:  Chest x-ray dated June 13, 2011. FINDINGS: The heart size and mediastinal contours  are within normal limits. Normal pulmonary vascularity. No focal consolidation, pleural effusion, or pneumothorax. Unchanged mild elevation of the left hemidiaphragm. No acute osseous abnormality. IMPRESSION: No active disease. Electronically Signed   By: Titus Dubin M.D.   On: 03/25/2019 16:43   Pathology:  Biopsy from [facility] dated 04/24/2016  ACCESSION NUMBER: [id number] RECEIVED: 04/24/2016 ORDERING PHYSICIAN:KONSTANTINOS I Johney Maine , MD PATIENT NAME:Sulkowski, Seaver SURGICAL PATHOLOGY REPORT  FINAL PATHOLOGIC DIAGNOSIS MICROSCOPIC EXAMINATION AND DIAGNOSIS  CONSULT SLIDES, SKIN, LEFT PARIETAL SCALP ADJACENT TO SCAR [phone], 03/31/2016): Malignant melanoma, Breslow depth at least 1.1 mm.  pkv COMMENT: Malignant melanoma, Breslow depth at least 1.1 mm Ulceration: Absent Mitotic index per mm2: 2 Peripheral margins: Involved Deep margin: Involved Regression: Not identified Microsatellitosis: Not identified Lymphovascular invasion: Not identified Perineural invasion: Absent AJCC/TNM Classification: pT2a NX MX   Report Prepared OH:[id number] ONAJIN , MD  I have personally reviewed the slides and/or other related materials referenced, and have edited the report as part of my pathologic assessment and final interpretation.  Electronically Signed Out By: Rolly Pancake , MD 04/25/2016 16:40:41   Assessment and Plan:  This is a pleasant 78 year old male with the following issues:  1.  Brain metastases 2.  History of melanoma of the scalp status post Mohs procedure followed 5 years later by surgical excision with negative margins for local recurrence 3.  Diabetes 4.  Hypertension 5.  Hyperlipidemia  -Discussed the findings to date with the patient.  We discussed that he has brain metastases from an unknown primary.  This could represent metastatic melanoma versus other primary.  A CT of the chest, abdomen, pelvis with contrast to complete his  staging work-up.  Will need biopsy once CT scan is complete.  Further recommendations will be made pending the results of the CT scan and biopsy. -Continue dexamethasone 4 mg every 6 hours. -Consult for radiation oncology has been placed for consideration of SRS versus whole brain radiation for brain metastases. -Monitor blood sugars closely while on dexamethasone -Management of hypertension hyperlipidemia per hospitalist.  Thank you for this referral.   Mikey Bussing, DNP, AGPCNP-BC, AOCNP  ADDENDUM: Hematology/Oncology Attending: I had a face-to-face encounter with the patient and also performed a physical exam on him.  I agree with the above note.  I recommended his care plan.  This is a very pleasant 78 years old white male with history of scalp melanoma status post wide excision followed by Mohs surgery at [facility] in 2017.  The patient mentioned that he did not have any staging work-up at that time.  He was followed by observation. Over the last few days he has been complaining of increasing fatigue and weakness as well as headache and mental status change with confusion and memory issues.  He presented to the emergency department at [facility] for evaluation CT scan of the brain followed by MRI showed multiple areas of acute hemorrhage in the bilateral occipital and left temporal lobes with the greatest area in the right posterior occipital lobe measuring 5.0 x 3.0 cm with some  mass-effect on the right lateral ventricle.  MRI of the brain with and without contrast confirmed the multiple scattered intracranial hemorrhagic metastasis with interbronchial hematoma in the parieto-occipital region.  The findings were concerning for hemorrhagic metastasis from melanoma.  I was consulted to evaluate the patient and give recommendation regarding his condition. I recommended for the patient to start treatment with Decadron 4 mg IV every 6 hours and this will be switched to oral and  tapered slowly after discharge. I also recommended for the patient to have CT scan of the chest, abdomen pelvis for evaluation of his disease and to rule out any other systemic metastasis. The scan was performed by the time I saw the patient and I personally reviewed the images and it showed right lung mass in addition to multiple mediastinal lymphadenopathy but the final report is still pending. I recommended for the patient to proceed with CT-guided core biopsy of the right lower lobe lung mass for confirmation of tissue diagnosis. For the multiple brain metastasis, will consult radiation oncology for evaluation and treatment of his condition. For the intracranial hematoma the patient may benefit from evaluation by neurosurgery. I will arrange for the patient to have a follow-up appointment with me at the [facility] within 1-2 weeks after discharge for more detailed discussion of his treatment options.  The patient may require treatment with immunotherapy in the future and this will be discussed in more details with him after confirmation of his diagnosis and initiation of the brain radiation. The patient agreed to the current plan.  I gave him my contact information and he was advised to call after discharge for the follow-up appointment. He was also advised to call if he has any concerning symptoms in the interval.  Disclaimer: This note was dictated with voice recognition software. Similar sounding words can inadvertently be transcribed and may be missed upon review. Eilleen Kempf, MD

## 2019-03-26 NOTE — Progress Notes (Signed)
Complain of chest pain. Appears to have anxiety and worries about wife.  Wanted to take EKG and give xanax. Pt doesn't want to take med. Want the med package open. Been talking to son, Delfino Lovett. Pt states no chest after a while and talking with son.  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  Will monitor.

## 2019-03-26 NOTE — Progress Notes (Signed)
Patient with history of scalp melanoma now with 1-2 week history of gait imbalance, stumbling.  CT Head showed multiple areas of acute hemorrhage with a right posterior occipital lobe mass.  Additional scans performed.  CT Chest Abdomen Pelvis showed: 1. 3.4 cm right lower lobe subpleural/peri diaphragmatic soft tissue pulmonary mass, highly suspicious for primary pulmonary malignancy or metastatic lesion. 2. Marked bilateral mediastinal and right hilar/peribronchial lymphadenopathy. 3. The mid esophagus is compressed or contiguous with the left paratracheal lymphadenopathy/mass. Esophageal involvement cannot be excluded. 4. 1.4 cm indeterminate right adrenal mass. 5. Left colonic diverticulosis without evidence of diverticulitis. 6. Bilateral fat containing inguinal hernias. 7. Enlarged prostate gland. Please correlate to serum PSA values.  IR consulted for lung mass biopsy.   Case reviewed by Dr. Pascal Lux who recommends consult to pulmonology vs. Cardiothoracic surgery for bronch-assisted biopsy.  If percutaneous biopsy to be pursued, patient will need a PET scan prior to being performed.   Brynda Greathouse, MS RD PA-C 5:19 PM

## 2019-03-26 NOTE — ED Notes (Signed)
Alternating between hot and cold for right hip pain. Pt states improvement of pain

## 2019-03-26 NOTE — Consult Note (Signed)
Radiation Oncology         [phone] ________________________________  Initial inpatient Consultation- conducted via telephone in light of current COVID-19 pandemic and recommendations to limit patient exposure in the healthcare setting.  Name: Randall Mcgee MRN: [id number]  Date of Service: 03/25/2019 DOB: October 23, 1940  [id number] [name], MD  No ref. provider found   REFERRING PHYSICIAN: Dr. Curt Bears  DIAGNOSIS: 78 y/o male with multiple new brain metastases, suspected metastatic melanoma but unknown primary- awaiting tissue biopsy    ICD-10-CM   1. Acute cerebral hemorrhage (HCC)  I61.9   2. Confusion  R41.0   3. Dehydration  E86.0   4. ARF (acute renal failure) (HCC)  N17.9 US Abdomen Complete    US Abdomen Complete  5. Abnormal liver function  R94.5 US Abdomen Complete    US Abdomen Complete  6. Right hip pain  M25.551 CANCELED: DG HIP UNILAT WITH PELVIS 2-3 VIEWS RIGHT    CANCELED: DG HIP UNILAT WITH PELVIS 2-3 VIEWS RIGHT  7. Right lower lobe lung mass  R91.8 CT BIOPSY    CT BIOPSY    HISTORY OF PRESENT ILLNESS: Randall Mcgee is a 78 y.o. male seen at the request of Dr. Julien Nordmann.  He has a prior history of Malignant melanoma of the scalp initially diagnosed and treated and treated at [facility] with Mohs surgery in 2012 and more recently had disease recurrence with a new lesion at the incisional scar from his previous procedure in 06/2016.  This was treated with surgical excision initially on 06/28/2016 and reexcision on 07/13/2016 for positive margins and again on 07/26/2016 for positive margins.  Pathology from the excision on 07/26/2016 showed no residual melanoma.  He was recently admitted to the hospital on 03/25/2019 due to complaints of a 1 to 2-week history of imbalance/stumbling, headaches, memory deficit, difficulty reading and generalized malaise.  CT head obtained at the time of admission showed multiple areas of acute hemorrhage in the bilateral  occipital and left temporal lobes with the largest area in the right posterior occipital lobe measuring 5 x 3 cm with mass-effect on the right lateral ventricle.  An MRI brain was performed on 03/26/2019 for further evaluation showing the previously identified intraparenchymal hematoma in the right parieto-occipital region with an estimated volume of 34 cc and surrounding vasogenic edema edema throughout the right parieto-occipital region with trace right to left midline shift.  An underlying metastatic lesion is presumably present.  There are multiple additional scattered enhancing masses along bilateral cerebral hemispheres, all of which demonstrate associated susceptibility artifact, consistent with hemorrhagic metastases.  A CT chest/abdomen/pelvis was obtained for disease staging and demonstrates a 3.4 cm right lower lobe subpleural/peri-diaphragmatic soft tissue pulmonary mass, highly suspicious for primary pulmonary malignancy versus metastatic lesion.  There is marketed bilateral mediastinal and right hilar/peribronchial lymphadenopathy.  Esophageal involvement cannot be excluded as the mid esophagus appears compressed or contiguous with the left paratracheal lymphadenopathy/mass.  Additionally, there is a 1.4 cm indeterminate right adrenal mass.  The patient was started on IV dexamethasone 4 mg every 6 hours with reported improvement in his headache and mental clarity.  Radiation oncology has been consulted for consideration of potential radiotherapy for management of the newly identified brain lesions.  PREVIOUS RADIATION THERAPY: No  PAST MEDICAL HISTORY:  Past Medical History:  Diagnosis Date   Anxiety    Cancer ([place]) 2017   melanoma on scalp   History of chicken pox    Hyperlipidemia  Hypertension    Right inguinal hernia       PAST SURGICAL HISTORY: Past Surgical History:  Procedure Laterality Date   APPLICATION OF A-CELL OF EXTREMITY N/A 07/13/2016   Procedure:  APPLICATION OF A-CELL;  Surgeon: Wallace Going, DO;  Location: [place];  Service: Plastics;  Laterality: N/A;   APPLICATION OF A-CELL OF HEAD/NECK N/A 06/28/2016   Procedure: APPLICATION OF A-CELL OF HEAD/NECK;  Surgeon: Wallace Going, DO;  Location: [place];  Service: Plastics;  Laterality: N/A;   APPLICATION OF A-CELL OF HEAD/NECK N/A 07/26/2016   Procedure: APPLICATION OF ACELL OF HEAD;  Surgeon: Wallace Going, DO;  Location: [place];  Service: Plastics;  Laterality: N/A;   APPLICATION OF A-CELL OF HEAD/NECK N/A 08/31/2016   Procedure: APPLICATION OF A-CELL OF HEAD/NECK;  Surgeon: Wallace Going, DO;  Location: [place];  Service: Plastics;  Laterality: N/A;   CATARACT EXTRACTION, BILATERAL     CHOLECYSTECTOMY     ENDARTERECTOMY  06/20/11   ENDARTERECTOMY  06/20/2011   Procedure: ENDARTERECTOMY CAROTID;  Surgeon: Elam Dutch, MD;  Location: [facility] OR;  Service: Vascular;  Laterality: Right;  Right Carotid endarterectomy with dacron patch angioplasty    INGUINAL HERNIA REPAIR Right 08/16/2018   Procedure: RIGHT INGUINAL HERNIA REPAIR WITH MESH;  Surgeon: Georganna Skeans, MD;  Location: [place];  Service: General;  Laterality: Right;   MASS EXCISION N/A 06/28/2016   Procedure: EXCISION SCALP MELANOMA;  Surgeon: Wallace Going, DO;  Location: [place];  Service: Plastics;  Laterality: N/A;   MASS EXCISION N/A 07/13/2016   Procedure: RE- EXCISION OF SCALP MELANOMA;  Surgeon: Wallace Going, DO;  Location: [place];  Service: Plastics;  Laterality: N/A;   MASS EXCISION N/A 07/26/2016   Procedure: RE-EXCISION OF SCALP MELANOMA FOR POSITIVE MARGAIN;  Surgeon: Wallace Going, DO;  Location: [place];  Service: Plastics;  Laterality: N/A;   SKIN SPLIT GRAFT Left 08/31/2016   Procedure: SKIN GRAFT SPLIT THICKNESS TO HIS SCALP  FROM HIS THIGH;  Surgeon: Wallace Going, DO;  Location: [place];  Service: Plastics;  Laterality: Left;   TONSILLECTOMY     TONSILLECTOMY      FAMILY HISTORY:  Family History  Problem Relation Age of Onset   Heart disease Mother     SOCIAL HISTORY:  Social History   Socioeconomic History   Marital status: Married    Spouse name: Not on file   Number of children: Not on file   Years of education: Not on file   Highest education level: Not on file  Occupational History   Not on file  Social Needs   Financial resource strain: Not on file   Food insecurity    Worry: Not on file    Inability: Not on file   Transportation needs    Medical: Not on file    Non-medical: Not on file  Tobacco Use   Smoking status: Never Smoker   Smokeless tobacco: Never Used  Substance and Sexual Activity   Alcohol use: No   Drug use: No    Types: Other-see comments   Sexual activity: Yes  Lifestyle   Physical activity    Days per week: Not on file    Minutes per session: Not on file   Stress: Not on file  Relationships   Social connections    Talks on phone: Not  on file    Gets together: Not on file    Attends religious service: Not on file    Active member of club or organization: Not on file    Attends meetings of clubs or organizations: Not on file    Relationship status: Not on file   Intimate partner violence    Fear of current or ex partner: Not on file    Emotionally abused: Not on file    Physically abused: Not on file    Forced sexual activity: Not on file  Other Topics Concern   Not on file  Social History Narrative   Not on file    ALLERGIES: Patient has no known allergies.  MEDICATIONS:  Current Facility-Administered Medications  Medication Dose Route Frequency Provider Last Rate Last Dose   acetaminophen (TYLENOL) tablet 650 mg  650 mg Oral Q6H PRN Barb Merino, MD   650 mg at 03/26/19 1007   ALPRAZolam (XANAX)  tablet 0.5 mg  0.5 mg Oral QHS Barb Merino, MD       ALPRAZolam Duanne Moron) tablet 0.5 mg  0.5 mg Oral Daily PRN Barb Merino, MD       amLODipine (NORVASC) tablet 5 mg  5 mg Oral Daily Barb Merino, MD   5 mg at 03/26/19 1007   atorvastatin (LIPITOR) tablet 20 mg  20 mg Oral q1800 Barb Merino, MD       dexamethasone (DECADRON) injection 4 mg  4 mg Intravenous Q6H Barb Merino, MD   4 mg at 03/26/19 1011   lisinopril (ZESTRIL) tablet 40 mg  40 mg Oral Daily Jani Gravel, MD   40 mg at 03/26/19 1007   loratadine (CLARITIN) tablet 10 mg  10 mg Oral Daily Jani Gravel, MD   10 mg at 03/26/19 1007   propranolol (INDERAL) tablet 40 mg  40 mg Oral BID Jani Gravel, MD   40 mg at 03/26/19 1108   sodium chloride flush (NS) 0.9 % injection 3 mL  3 mL Intravenous Once Charlesetta Shanks, MD       traMADol Veatrice Bourbon) tablet 50 mg  50 mg Oral Q6H PRN Jani Gravel, MD   50 mg at 03/26/19 0111    REVIEW OF SYSTEMS:  On review of systems, the patient reports that he is doing well overall.  He reports improvement in his headache and mental clarity since the time of admission, likely secondary to steroids.  He denies any focal weakness or paresthesias in the upper or lower extremities.  He continues with generalized fatigue/malaise but denies any chest pain, shortness of breath, cough, fevers, chills, night sweats, or unintended weight changes.  He denies any bowel or bladder disturbances, and denies abdominal pain, nausea or vomiting.  He denies any new musculoskeletal or joint aches or pains. A complete review of systems is obtained and is otherwise negative.   PHYSICAL EXAM:  Wt Readings from Last 3 Encounters:  02/19/19 207 lb 9.6 oz (94.2 kg)  09/19/18 210 lb 9.6 oz (95.5 kg)  08/16/18 209 lb 10.5 oz (95.1 kg)   Temp Readings from Last 3 Encounters:  03/26/19 98 F (36.7 C) (Axillary)  02/19/19 98.1 F (36.7 C) (Oral)  12/17/18 97.7 F (36.5 C)   BP Readings from Last 3 Encounters:  03/26/19  (!) 149/81  02/19/19 140/80  09/19/18 (!) 150/80   Pulse Readings from Last 3 Encounters:  03/26/19 65  02/19/19 76  09/19/18 93   Pain Assessment Pain Score: 0-No pain/10  As per medical oncology  documentation earlier today: General:  well-nourished in no acute distress.   Head: Has a well-healed skin graft to the top of his head.  No overlying nodularity or lesions noted. Eyes:  no scleral icterus.  EOMI.  PERRL. ENT:  There were no oropharyngeal lesions.   Neck was without thyromegaly.   Lymphatics:  Negative cervical, supraclavicular or axillary adenopathy.   Respiratory: lungs were clear bilaterally without wheezing or crackles.   Cardiovascular:  Regular rate and rhythm, S1/S2, without murmur, rub or gallop.  There was no pedal edema.   GI:  abdomen was soft, flat, nontender, nondistended, without organomegaly.  Muscoloskeletal:  no spinal tenderness of palpation of vertebral spine.   Skin exam was without echymosis, petichae.  Bruising noted to his left toes. Neuro exam: Patient was alert and oriented.  Attention was good.   Language was appropriate.  Mood was normal without depression.  Speech was not pressured.  Thought content was not tangential.  Left homonymous hemianopia noted.  KPS = 70  100 - Normal; no complaints; no evidence of disease. 90   - Able to carry on normal activity; minor signs or symptoms of disease. 80   - Normal activity with effort; some signs or symptoms of disease. 39   - Cares for self; unable to carry on normal activity or to do active work. 60   - Requires occasional assistance, but is able to care for most of his personal needs. 50   - Requires considerable assistance and frequent medical care. 71   - Disabled; requires special care and assistance. 46   - Severely disabled; hospital admission is indicated although death not imminent. 23   - Very sick; hospital admission necessary; active supportive treatment necessary. 10   - Moribund; fatal  processes progressing rapidly. 0     - Dead  Karnofsky DA, Abelmann Winifred, Craver LS and Burchenal JH [phone]) The use of the nitrogen mustards in the palliative treatment of carcinoma: with particular reference to bronchogenic carcinoma Cancer 1 [phone]  LABORATORY DATA:  Lab Results  Component Value Date   WBC 7.0 03/25/2019   HGB 13.4 03/25/2019   HCT 39.6 03/25/2019   MCV 100.0 03/25/2019   PLT 201 03/25/2019   Lab Results  Component Value Date   NA 141 03/25/2019   K 4.3 03/25/2019   CL 106 03/25/2019   CO2 26 03/25/2019   Lab Results  Component Value Date   ALT 24 03/25/2019   AST 48 (H) 03/25/2019   ALKPHOS 41 03/25/2019   BILITOT 0.6 03/25/2019     RADIOGRAPHY: Ct Head Wo Contrast  Result Date: 03/25/2019 CLINICAL DATA:  Ct head wo, Pt's wife reports that Sunday morning he woke her up and was extremely disoriented and confused. Pt reported to wife he had a bad headache and temperature. EXAM: CT HEAD WITHOUT CONTRAST TECHNIQUE: Contiguous axial images were obtained from the base of the skull through the vertex without intravenous contrast. COMPARISON:  None. FINDINGS: Brain: There are 3 areas of acute masslike hemorrhage in the bilateral occipital and left temporal lobes. The largest area in the right occipital lobe measures 5.0 x 3.2 cm. A small area in the posterior left occipital lobe measures 1.6 by 1.8 cm. And another area in the left temporal lobe measures 1.4 by 1.1 cm. There is mass effect on the occipital hormone of the right lateral ventricle secondary to edema surrounding the hemorrhage in the posterior right occipital lobe. No midline shift. Mild enlargement of  the ventricles is likely secondary to ex vacuo dilation from brain atrophy. Vascular: No hyperdense vessel or unexpected calcification. Skull: Normal. Negative for fracture or focal lesion. Sinuses/Orbits: Mucosal thickening in the bilateral maxillary and ethmoid sinuses. Other: None. IMPRESSION: Multiple areas of  acute hemorrhage in the bilateral occipital and left temporal lobes. The largest area in the right posterior occipital lobe measures 5 x 3 cm with some mass effect on the right lateral ventricle. Differential considerations include hemorrhagic metastasis and emboli. Consider further evaluation with brain MRI. These results were called by telephone at the time of interpretation on 03/25/2019 at 6:55 pm to Dr. Janeece Fitting , who verbally acknowledged these results. Electronically Signed   By: Audie Pinto M.D.   On: 03/25/2019 19:05   Ct Chest W Contrast  Result Date: 03/26/2019 CLINICAL DATA:  Liver/biliary neoplasm. EXAM: CT CHEST, ABDOMEN, AND PELVIS WITH CONTRAST TECHNIQUE: Multidetector CT imaging of the chest, abdomen and pelvis was performed following the standard protocol during bolus administration of intravenous contrast. CONTRAST:  158mL ISOVUE-300 IOPAMIDOL (ISOVUE-300) INJECTION 61% COMPARISON:  Abdominal ultrasound dated 03/25/2019 FINDINGS: CT CHEST FINDINGS Cardiovascular: Normal heart size. No pericardial effusion. Calcific atherosclerotic disease of the coronary arteries and to mild degree the aorta. Mediastinum/Nodes: Marked bilateral mediastinal lymphadenopathy with abnormal lymph nodes in left paratracheal, left prevascular, left precarinal, central subcarinal stations, as well as right hilar and peribronchial stations. Additional abnormal lymph node in left supra clavicular location. The largest conglomerate of abnormal appearing lymph nodes in the subcarinal region measures 2.9 x 5.4 cm. The trachea is patent. Lungs/Pleura: There is a large subpleural/peri diaphragmatic soft tissue mass in the right lower lobe measuring 3.4 by 4.1 by 3.5 cm. Tiny 2-3 mm perifissural soft tissue nodule is seen in the right middle lobe, image 103/142, sequence 4. A second soft tissue nodule in the more inferior right middle lobe measures 3 mm, image 109/142, sequence 4. There are areas of linear atelectasis  in the bilateral lung bases and lingula. Musculoskeletal: No chest wall mass or suspicious bone lesions identified. CT ABDOMEN PELVIS FINDINGS Hepatobiliary: No focal liver abnormality is seen. Status post cholecystectomy. No biliary dilatation. Pancreas: Unremarkable. No pancreatic ductal dilatation or surrounding inflammatory changes. Spleen: Normal in size without focal abnormality. Adrenals/Urinary Tract: 1.4 cm indeterminate right adrenal mass. The left adrenal gland is normal. The kidneys are normal. No left renal masses appreciated. No hydronephrosis or nephrolithiasis. There is a right peritrigonal bladder diverticulum. Stomach/Bowel: Stomach is within normal limits. Appendix appears normal. No evidence of bowel wall thickening, distention, or inflammatory changes. Left colonic diverticulosis without evidence of diverticulitis. Vascular/Lymphatic: Aortic atherosclerosis. No enlarged abdominal or pelvic lymph nodes. Reproductive: Enlarged prostate gland. Other: Bilateral fat containing inguinal hernias. Musculoskeletal: No acute osseous findings. Spondylosis of the spine. IMPRESSION: 1. 3.4 cm right lower lobe subpleural/peri diaphragmatic soft tissue pulmonary mass, highly suspicious for primary pulmonary malignancy or metastatic lesion. 2. Marked bilateral mediastinal and right hilar/peribronchial lymphadenopathy. 3. The mid esophagus is compressed or contiguous with the left paratracheal lymphadenopathy/mass. Esophageal involvement cannot be excluded. 4. 1.4 cm indeterminate right adrenal mass. 5. Left colonic diverticulosis without evidence of diverticulitis. 6. Bilateral fat containing inguinal hernias. 7. Enlarged prostate gland. Please correlate to serum PSA values. Aortic Atherosclerosis (ICD10-I70.0). These results will be called to the ordering clinician or representative by the Radiologist Assistant, and communication documented in the PACS or zVision Dashboard. Electronically Signed   By: Fidela Salisbury M.D.   On: 03/26/2019 14:00   Mr  Brain W And Wo Contrast  Result Date: 03/26/2019 CLINICAL DATA:  Follow-up examination for acute intracranial hemorrhage. Altered mental status. EXAM: MRI HEAD WITHOUT AND WITH CONTRAST TECHNIQUE: Multiplanar, multiecho pulse sequences of the brain and surrounding structures were obtained without and with intravenous contrast. CONTRAST:  10 cc of Gadavist. COMPARISON:  Prior CT from 03/25/2019. FINDINGS: Brain: Mild age-related cerebral atrophy with chronic microvascular ischemic disease. Few scattered superimposed remote lacunar infarcts noted within the right basal ganglia and left thalamus. Previously identified intraparenchymal hematoma centered at the right parieto-occipital region again seen, measuring approximately 4.1 x 4.3 x 3.9 cm (estimated volume 34 cc). Underlying metastatic lesion is presumably present. Surrounding vasogenic edema throughout the right parieto-occipital region with mild mass effect on the adjacent atrium of the right lateral ventricle. No definite intraventricular extension of hemorrhage. Associated trace right-to-left midline shift of the septum pellucidum. Basilar cisterns remain patent. Multiple additional scattered enhancing masses seen involving the bilateral cerebral hemispheres, all of which demonstrate associated susceptibility artifact, consistent with hemorrhagic metastases. These are seen as follows; punctate enhancing lesion at the posterior left centrum semi ovale (series 18, image 45), 8 mm enhancing lesion at the right parietal cortex (series 18, image 43), 5 mm lesion involving the cortical gray matter of the anterior left frontal lobe (series 18, image 42), 7 mm lesion along the cortex of the anterior right frontal lobe (series 18, image 34), 11 mm dural-based lesion at the right occipital lobe (series 18, image 28), adjacent punctate 4 mm lesion (series 18, image 25), 14 mm lesion at the anterior left temporal lobe (series  18, image 25), 20 x 19 mm dural-based lesion at the left occipital lobe (series 18, image 23). Additional possible punctate lesion at the anterior temporal left temporal pole (series 18, image 18), not entirely certain given small size. Relative sparing of the brainstem and cerebellum. No evidence for acute infarct. No extra-axial fluid collection. No hydrocephalus or ventricular trapping. No other acute intracranial abnormality. Vascular: Major intracranial vascular flow voids are maintained. Skull and upper cervical spine: Craniocervical junction within normal limits. Degenerative spondylolysis noted at C2-3 without significant stenosis. No focal marrow replacing lesion. Postsurgical changes noted at the left frontal scalp. Sinuses/Orbits: Patient status post bilateral ocular lens replacement. Globes and orbital soft tissues demonstrate no acute finding. Scattered mucosal thickening noted within the ethmoidal air cells and maxillary sinuses with superimposed small maxillary sinus retention cyst. No mastoid effusion. Inner ear structures grossly normal. Other: None. IMPRESSION: 1. Multiple scattered intracranial hemorrhagic metastases as detailed above. One of these lesions at the parieto-occipital region has bled with associated 34 cc intraparenchymal hematoma. Associated regional mass effect with trace right-to-left midline shift. 2. Underlying age-related cerebral atrophy with mild chronic small vessel ischemic disease. Electronically Signed   By: Jeannine Boga M.D.   On: 03/26/2019 03:13   US Abdomen Complete  Result Date: 03/25/2019 CLINICAL DATA:  Abnormal LFTs. EXAM: ABDOMEN ULTRASOUND COMPLETE COMPARISON:  None. FINDINGS: Gallbladder: The gallbladder surgically absent. Common bile duct: Diameter: 3 mm Liver: The liver echogenicity appears increased. The hepatic echotexture is coarsened and heterogeneous. Portal vein is patent on color Doppler imaging with normal direction of blood flow towards the  liver. IVC: No abnormality visualized. Pancreas: The pancreas is poorly evaluated secondary to overlying bowel gas. Spleen: Size and appearance within normal limits. Right Kidney: Length: 9.3 cm. Echogenicity within normal limits. No mass or hydronephrosis visualized. Left Kidney: Length: 11 cm. There is a hypoechoic 3.3 x 2.9 by 3  cm mass in the interpolar region of the left kidney. This mass appears to demonstrate some internal color Doppler flow. There is no hydronephrosis. Abdominal aorta: No aneurysm visualized. Other findings: None. IMPRESSION: 1. Limited study secondary to overlying bowel gas and patient body habitus. 2. Status post cholecystectomy. 3. Coarsened heterogeneous appearance of the liver is nonspecific and may be secondary to underlying hepatocellular disease or hepatic steatosis. 4. Indeterminate 3 cm mass involving the interpolar region of the left kidney. Follow-up with a nonemergent outpatient contrast enhanced CT or MRI is recommended for further evaluation of this finding. 5. Pancreas not well evaluated secondary to overlying bowel gas. Electronically Signed   By: Constance Holster M.D.   On: 03/25/2019 23:10   Ct Abdomen Pelvis W Contrast  Result Date: 03/26/2019 CLINICAL DATA:  Liver/biliary neoplasm. EXAM: CT CHEST, ABDOMEN, AND PELVIS WITH CONTRAST TECHNIQUE: Multidetector CT imaging of the chest, abdomen and pelvis was performed following the standard protocol during bolus administration of intravenous contrast. CONTRAST:  139mL ISOVUE-300 IOPAMIDOL (ISOVUE-300) INJECTION 61% COMPARISON:  Abdominal ultrasound dated 03/25/2019 FINDINGS: CT CHEST FINDINGS Cardiovascular: Normal heart size. No pericardial effusion. Calcific atherosclerotic disease of the coronary arteries and to mild degree the aorta. Mediastinum/Nodes: Marked bilateral mediastinal lymphadenopathy with abnormal lymph nodes in left paratracheal, left prevascular, left precarinal, central subcarinal stations, as well as  right hilar and peribronchial stations. Additional abnormal lymph node in left supra clavicular location. The largest conglomerate of abnormal appearing lymph nodes in the subcarinal region measures 2.9 x 5.4 cm. The trachea is patent. Lungs/Pleura: There is a large subpleural/peri diaphragmatic soft tissue mass in the right lower lobe measuring 3.4 by 4.1 by 3.5 cm. Tiny 2-3 mm perifissural soft tissue nodule is seen in the right middle lobe, image 103/142, sequence 4. A second soft tissue nodule in the more inferior right middle lobe measures 3 mm, image 109/142, sequence 4. There are areas of linear atelectasis in the bilateral lung bases and lingula. Musculoskeletal: No chest wall mass or suspicious bone lesions identified. CT ABDOMEN PELVIS FINDINGS Hepatobiliary: No focal liver abnormality is seen. Status post cholecystectomy. No biliary dilatation. Pancreas: Unremarkable. No pancreatic ductal dilatation or surrounding inflammatory changes. Spleen: Normal in size without focal abnormality. Adrenals/Urinary Tract: 1.4 cm indeterminate right adrenal mass. The left adrenal gland is normal. The kidneys are normal. No left renal masses appreciated. No hydronephrosis or nephrolithiasis. There is a right peritrigonal bladder diverticulum. Stomach/Bowel: Stomach is within normal limits. Appendix appears normal. No evidence of bowel wall thickening, distention, or inflammatory changes. Left colonic diverticulosis without evidence of diverticulitis. Vascular/Lymphatic: Aortic atherosclerosis. No enlarged abdominal or pelvic lymph nodes. Reproductive: Enlarged prostate gland. Other: Bilateral fat containing inguinal hernias. Musculoskeletal: No acute osseous findings. Spondylosis of the spine. IMPRESSION: 1. 3.4 cm right lower lobe subpleural/peri diaphragmatic soft tissue pulmonary mass, highly suspicious for primary pulmonary malignancy or metastatic lesion. 2. Marked bilateral mediastinal and right hilar/peribronchial  lymphadenopathy. 3. The mid esophagus is compressed or contiguous with the left paratracheal lymphadenopathy/mass. Esophageal involvement cannot be excluded. 4. 1.4 cm indeterminate right adrenal mass. 5. Left colonic diverticulosis without evidence of diverticulitis. 6. Bilateral fat containing inguinal hernias. 7. Enlarged prostate gland. Please correlate to serum PSA values. Aortic Atherosclerosis (ICD10-I70.0). These results will be called to the ordering clinician or representative by the Radiologist Assistant, and communication documented in the PACS or zVision Dashboard. Electronically Signed   By: Fidela Salisbury M.D.   On: 03/26/2019 14:00   Dg Chest Portable 1 View  Result Date: 03/25/2019 CLINICAL DATA:  Chest pain and shortness of breath. EXAM: PORTABLE CHEST 1 VIEW COMPARISON:  Chest x-ray dated June 13, 2011. FINDINGS: The heart size and mediastinal contours are within normal limits. Normal pulmonary vascularity. No focal consolidation, pleural effusion, or pneumothorax. Unchanged mild elevation of the left hemidiaphragm. No acute osseous abnormality. IMPRESSION: No active disease. Electronically Signed   By: Titus Dubin M.D.   On: 03/25/2019 16:43      IMPRESSION/PLAN: 1. 78 y.o. male with multiple new brain metastases, suspected metastatic melanoma but unknown primary- awaiting tissue biopsy. Today, I talked to the patient about the findings and workup thus far. We discussed the natural history of metastatic carcinoma and general treatment, highlighting the role of radiotherapy in the management. At this point, the patient would potentially benefit from radiotherapy. The options include whole brain irradiation versus stereotactic radiosurgery [facility]). There are pros and cons associated with each of these potential treatment options. Whole brain radiotherapy would treat the known metastatic deposits and help provide some reduction of risk for future brain metastases. However, whole  brain radiotherapy carries potential risks including hair loss, subacute somnolence, and neurocognitive changes including a possible reduction in short-term memory. Whole brain radiotherapy also may carry a lower likelihood of tumor control at the treatment sites because of the low-dose used. Stereotactic radiosurgery carries a higher likelihood for local tumor control at the targeted sites with lower associated risk for neurocognitive changes such as memory loss. However, the use of stereotactic radiosurgery in this setting may leave the patient at increased risk for new brain metastases elsewhere in the brain as high as 50-60%. Accordingly, patients who receive stereotactic radiosurgery in this setting should undergo ongoing surveillance imaging with brain MRI more frequently in order to identify and treat new small brain metastases before they become symptomatic. Stereotactic radiosurgery does carry some different risks, including a risk of radionecrosis. We discussed the need for biopsy for tissue confirmation as this will further direct our formal treatment recommendations. We will also need additional brain imaging with a 3T MRI for SRS treatment planning purposes if he is deemed a candidate for [facility] pending final pathology. Interventional radiology was consulted and has recommended a pulmonology consult for consideration of bronchoscopy assisted biopsy.  We will continue to follow the patient peripherally and await final pathology results. Once pathology is available, we will plan to connect back with the patient, either in-patient or out-patient, to further discuss radiation treatment options. Recommend continuing steroids to help manage neurologic symptoms associated with edema secondary to brain lesions, until his brain disease has been treated, at which time we will plan to taper him off the steroids. The patient was encouraged to ask questions that were answered to his stated satisfaction and he is  comfortable with the stated plan.  In a visit lasting 70 minutes, greater than 50% of that time was spent in telephone conversation, discussing his case and coordinating his care.   Nicholos Johns, PA-C    Tyler Pita, MD  [facility] Direct Dial: [phone]   Fax: [phone] [email or web address]   Skype   [email or web address]

## 2019-03-26 NOTE — Progress Notes (Signed)
PROGRESS NOTE    Randall Mcgee  [id number] DOB: Jun 24, 1941 DOA: 03/25/2019 PCP: Isaac Bliss, Rayford Halsted, MD    Brief Narrative:  78 year old gentleman with history of hypertension, hyperlipidemia, anxiety and remote history of a scalp melanoma status post excision in 2017 came to the emergency room with about 1 or 2 weeks of stumbling and losing balance, not feeling well with low-grade fever since last few days, loss of concentration and memory lack.  Hemodynamically stable in the ER.  CT head showed multiple areas of acute hemorrhage with right posterior occipital lobe mass.  COVID-19 test was negative.  Chest x-ray was normal.  Admitted with metastatic brain cancer with unknown primary.   Assessment & Plan:   Principal Problem:   ICH (intracerebral hemorrhage) (HCC) Active Problems:   Hypertension   Hypercholesterolemia   Impaired glucose tolerance  Intracerebral hemorrhage/metastatic brain cancer with hemorrhage and vasogenic edema: Neurologically stable now. Avoid all NSAID's and aspirin. Start dexamethasone 4 mg every 6 hours IV.  Tylenol and tramadol for pain relief. CT scan of the chest, abdomen pelvis for screening.  Ultrasound of the kidney shows left kidney mass 3 cm, will confirm with contrasted CT scan. Called and discussed case with Dr. Julien Nordmann with oncology.  Consult placed.  They will evaluate the patient and possibly consult radiation oncology.  Accelerated hypertension: We will control his blood pressure better.  Resume lisinopril and propranolol.  Will add amlodipine.  Hyperlipidemia: On a statin at home.  Continue.  Abnormal blood sugar: A1c 6.1.  No indication for treatment.  Anticipate elevated blood sugars with steroid treatment, will hold off on initiating any treatment.   DVT prophylaxis: SCDs Code Status: Full code Family Communication: None Disposition Plan: Pending.    Consultants:   Oncology  Neurology  Procedures:    None  Antimicrobials:   None   Subjective: Patient seen and examined.  Denies any overnight events.  Currently having some headache on the right posterior head.  He also has some heel pain on the right side and wanting some Tylenol.  Patient thinks he might have bumped his hip when walking erratically since last 2 weeks.   Objective: Vitals:   03/26/19 0600 03/26/19 0700 03/26/19 0715 03/26/19 0730  BP: 139/77 (!) 150/80  (!) 178/88  Pulse: 66  74 77  Resp: 17  14 18   Temp:    98.7 F (37.1 C)  TempSrc:    Oral  SpO2: 96%  94% 95%   No intake or output data in the 24 hours ending 03/26/19 1114 There were no vitals filed for this visit.  Examination:  General exam: Appears calm and comfortable, anxious Respiratory system: Clear to auscultation. Respiratory effort normal. Cardiovascular system: S1 & S2 heard, RRR. No JVD, murmurs, rubs, gallops or clicks. No pedal edema. Gastrointestinal system: Abdomen is nondistended, soft and nontender. No organomegaly or masses felt. Normal bowel sounds heard. Central nervous system: Alert and oriented.  Left homonymous hemianopia.   Extremities: Symmetric 5 x 5 power. Skin: No rashes, lesions or ulcers Psychiatry: Judgement and insight appear normal. Mood & affect appropriate.     Data Reviewed: I have personally reviewed following labs and imaging studies  CBC: Recent Labs  Lab 03/25/19 1354  WBC 7.0  HGB 13.4  HCT 39.6  MCV 100.0  PLT 123456   Basic Metabolic Panel: Recent Labs  Lab 03/25/19 1354  NA 141  K 4.3  CL 106  CO2 26  GLUCOSE 183*  BUN 15  CREATININE 1.26*  CALCIUM 9.7   GFR: CrCl cannot be calculated (Unknown ideal weight.). Liver Function Tests: Recent Labs  Lab 03/25/19 1354  AST 48*  ALT 24  ALKPHOS 41  BILITOT 0.6  PROT 6.8  ALBUMIN 3.8   No results for input(s): LIPASE, AMYLASE in the last 168 hours. No results for input(s): AMMONIA in the last 168 hours. Coagulation Profile: Recent  Labs  Lab 03/25/19 2110  INR 1.1   Cardiac Enzymes: Recent Labs  Lab 03/25/19 2110  CKTOTAL 164  CKMB 2.7   BNP (last 3 results) No results for input(s): PROBNP in the last 8760 hours. HbA1C: Recent Labs    03/25/19 1354  HGBA1C 6.1*   CBG: No results for input(s): GLUCAP in the last 168 hours. Lipid Profile: No results for input(s): CHOL, HDL, LDLCALC, TRIG, CHOLHDL, LDLDIRECT in the last 72 hours. Thyroid Function Tests: No results for input(s): TSH, T4TOTAL, FREET4, T3FREE, THYROIDAB in the last 72 hours. Anemia Panel: No results for input(s): VITAMINB12, FOLATE, FERRITIN, TIBC, IRON, RETICCTPCT in the last 72 hours. Sepsis Labs: No results for input(s): PROCALCITON, LATICACIDVEN in the last 168 hours.  Recent Results (from the past 240 hour(s))  SARS CORONAVIRUS 2 (TAT 6-24 HRS) Nasopharyngeal Nasopharyngeal Swab     Status: None   Collection Time: 03/25/19  7:41 PM   Specimen: Nasopharyngeal Swab  Result Value Ref Range Status   SARS Coronavirus 2 NEGATIVE NEGATIVE Final    Comment: (NOTE) SARS-CoV-2 target nucleic acids are NOT DETECTED. The SARS-CoV-2 RNA is generally detectable in upper and lower respiratory specimens during the acute phase of infection. Negative results do not preclude SARS-CoV-2 infection, do not rule out co-infections with other pathogens, and should not be used as the sole basis for treatment or other patient management decisions. Negative results must be combined with clinical observations, patient history, and epidemiological information. The expected result is Negative. Fact Sheet for Patients: SugarRoll.be Fact Sheet for Healthcare Providers: https://www.woods-mathews.com/ This test is not yet approved or cleared by the [place] FDA and  has been authorized for detection and/or diagnosis of SARS-CoV-2 by FDA under an Emergency Use Authorization (EUA). This EUA will remain  in effect  (meaning this test can be used) for the duration of the COVID-19 declaration under Section 56 4(b)(1) of the Act, 21 U.S.C. section 360bbb-3(b)(1), unless the authorization is terminated or revoked sooner. Performed at [facility], [place] [address] [place], [place] [address]          Radiology Studies: Ct Head Wo Contrast  Result Date: 03/25/2019 CLINICAL DATA:  Ct head wo, Pt's wife reports that Sunday morning he woke her up and was extremely disoriented and confused. Pt reported to wife he had a bad headache and temperature. EXAM: CT HEAD WITHOUT CONTRAST TECHNIQUE: Contiguous axial images were obtained from the base of the skull through the vertex without intravenous contrast. COMPARISON:  None. FINDINGS: Brain: There are 3 areas of acute masslike hemorrhage in the bilateral occipital and left temporal lobes. The largest area in the right occipital lobe measures 5.0 x 3.2 cm. A small area in the posterior left occipital lobe measures 1.6 by 1.8 cm. And another area in the left temporal lobe measures 1.4 by 1.1 cm. There is mass effect on the occipital hormone of the right lateral ventricle secondary to edema surrounding the hemorrhage in the posterior right occipital lobe. No midline shift. Mild enlargement of the ventricles is likely secondary to ex vacuo dilation from  brain atrophy. Vascular: No hyperdense vessel or unexpected calcification. Skull: Normal. Negative for fracture or focal lesion. Sinuses/Orbits: Mucosal thickening in the bilateral maxillary and ethmoid sinuses. Other: None. IMPRESSION: Multiple areas of acute hemorrhage in the bilateral occipital and left temporal lobes. The largest area in the right posterior occipital lobe measures 5 x 3 cm with some mass effect on the right lateral ventricle. Differential considerations include hemorrhagic metastasis and emboli. Consider further evaluation with brain MRI. These results were called by telephone at the time of  interpretation on 03/25/2019 at 6:55 pm to Dr. Janeece Fitting , who verbally acknowledged these results. Electronically Signed   By: Audie Pinto M.D.   On: 03/25/2019 19:05   Mr Brain W And Wo Contrast  Result Date: 03/26/2019 CLINICAL DATA:  Follow-up examination for acute intracranial hemorrhage. Altered mental status. EXAM: MRI HEAD WITHOUT AND WITH CONTRAST TECHNIQUE: Multiplanar, multiecho pulse sequences of the brain and surrounding structures were obtained without and with intravenous contrast. CONTRAST:  10 cc of Gadavist. COMPARISON:  Prior CT from 03/25/2019. FINDINGS: Brain: Mild age-related cerebral atrophy with chronic microvascular ischemic disease. Few scattered superimposed remote lacunar infarcts noted within the right basal ganglia and left thalamus. Previously identified intraparenchymal hematoma centered at the right parieto-occipital region again seen, measuring approximately 4.1 x 4.3 x 3.9 cm (estimated volume 34 cc). Underlying metastatic lesion is presumably present. Surrounding vasogenic edema throughout the right parieto-occipital region with mild mass effect on the adjacent atrium of the right lateral ventricle. No definite intraventricular extension of hemorrhage. Associated trace right-to-left midline shift of the septum pellucidum. Basilar cisterns remain patent. Multiple additional scattered enhancing masses seen involving the bilateral cerebral hemispheres, all of which demonstrate associated susceptibility artifact, consistent with hemorrhagic metastases. These are seen as follows; punctate enhancing lesion at the posterior left centrum semi ovale (series 18, image 45), 8 mm enhancing lesion at the right parietal cortex (series 18, image 43), 5 mm lesion involving the cortical gray matter of the anterior left frontal lobe (series 18, image 42), 7 mm lesion along the cortex of the anterior right frontal lobe (series 18, image 34), 11 mm dural-based lesion at the right occipital  lobe (series 18, image 28), adjacent punctate 4 mm lesion (series 18, image 25), 14 mm lesion at the anterior left temporal lobe (series 18, image 25), 20 x 19 mm dural-based lesion at the left occipital lobe (series 18, image 23). Additional possible punctate lesion at the anterior temporal left temporal pole (series 18, image 18), not entirely certain given small size. Relative sparing of the brainstem and cerebellum. No evidence for acute infarct. No extra-axial fluid collection. No hydrocephalus or ventricular trapping. No other acute intracranial abnormality. Vascular: Major intracranial vascular flow voids are maintained. Skull and upper cervical spine: Craniocervical junction within normal limits. Degenerative spondylolysis noted at C2-3 without significant stenosis. No focal marrow replacing lesion. Postsurgical changes noted at the left frontal scalp. Sinuses/Orbits: Patient status post bilateral ocular lens replacement. Globes and orbital soft tissues demonstrate no acute finding. Scattered mucosal thickening noted within the ethmoidal air cells and maxillary sinuses with superimposed small maxillary sinus retention cyst. No mastoid effusion. Inner ear structures grossly normal. Other: None. IMPRESSION: 1. Multiple scattered intracranial hemorrhagic metastases as detailed above. One of these lesions at the parieto-occipital region has bled with associated 34 cc intraparenchymal hematoma. Associated regional mass effect with trace right-to-left midline shift. 2. Underlying age-related cerebral atrophy with mild chronic small vessel ischemic disease. Electronically Signed   By:  Jeannine Boga M.D.   On: 03/26/2019 03:13   US Abdomen Complete  Result Date: 03/25/2019 CLINICAL DATA:  Abnormal LFTs. EXAM: ABDOMEN ULTRASOUND COMPLETE COMPARISON:  None. FINDINGS: Gallbladder: The gallbladder surgically absent. Common bile duct: Diameter: 3 mm Liver: The liver echogenicity appears increased. The hepatic  echotexture is coarsened and heterogeneous. Portal vein is patent on color Doppler imaging with normal direction of blood flow towards the liver. IVC: No abnormality visualized. Pancreas: The pancreas is poorly evaluated secondary to overlying bowel gas. Spleen: Size and appearance within normal limits. Right Kidney: Length: 9.3 cm. Echogenicity within normal limits. No mass or hydronephrosis visualized. Left Kidney: Length: 11 cm. There is a hypoechoic 3.3 x 2.9 by 3 cm mass in the interpolar region of the left kidney. This mass appears to demonstrate some internal color Doppler flow. There is no hydronephrosis. Abdominal aorta: No aneurysm visualized. Other findings: None. IMPRESSION: 1. Limited study secondary to overlying bowel gas and patient body habitus. 2. Status post cholecystectomy. 3. Coarsened heterogeneous appearance of the liver is nonspecific and may be secondary to underlying hepatocellular disease or hepatic steatosis. 4. Indeterminate 3 cm mass involving the interpolar region of the left kidney. Follow-up with a nonemergent outpatient contrast enhanced CT or MRI is recommended for further evaluation of this finding. 5. Pancreas not well evaluated secondary to overlying bowel gas. Electronically Signed   By: Constance Holster M.D.   On: 03/25/2019 23:10   Dg Chest Portable 1 View  Result Date: 03/25/2019 CLINICAL DATA:  Chest pain and shortness of breath. EXAM: PORTABLE CHEST 1 VIEW COMPARISON:  Chest x-ray dated June 13, 2011. FINDINGS: The heart size and mediastinal contours are within normal limits. Normal pulmonary vascularity. No focal consolidation, pleural effusion, or pneumothorax. Unchanged mild elevation of the left hemidiaphragm. No acute osseous abnormality. IMPRESSION: No active disease. Electronically Signed   By: Titus Dubin M.D.   On: 03/25/2019 16:43        Scheduled Meds:  ALPRAZolam  0.5 mg Oral QHS   amLODipine  5 mg Oral Daily   atorvastatin  20 mg Oral  q1800   dexamethasone (DECADRON) injection  4 mg Intravenous Q6H   lisinopril  40 mg Oral Daily   loratadine  10 mg Oral Daily   propranolol  40 mg Oral BID   sodium chloride flush  3 mL Intravenous Once   Continuous Infusions:   LOS: 1 day    Time spent: 35 minutes    Barb Merino, MD [facility] Pager [phone]  If 7PM-7AM, please contact night-coverage [email or web address] Password [email or web address] 03/26/2019, 11:14 AM

## 2019-03-26 NOTE — Progress Notes (Signed)
Obtained report from [facility], Patient coming to room 3w-35

## 2019-03-27 ENCOUNTER — Inpatient Hospital Stay (HOSPITAL_COMMUNITY): Payer: Medicare Other

## 2019-03-27 LAB — BASIC METABOLIC PANEL
Anion gap: 14 (ref 5–15)
BUN: 25 mg/dL — ABNORMAL HIGH (ref 8–23)
CO2: 22 mmol/L (ref 22–32)
Calcium: 9.7 mg/dL (ref 8.9–10.3)
Chloride: 103 mmol/L (ref 98–111)
Creatinine, Ser: 1.18 mg/dL (ref 0.61–1.24)
GFR calc Af Amer: >60 mL/min (ref >60)
GFR calc non Af Amer: 58 mL/min — ABNORMAL LOW (ref >60)
Glucose, Bld: 170 mg/dL — ABNORMAL HIGH (ref 70–99)
Potassium: 4.1 mmol/L (ref 3.5–5.1)
Sodium: 139 mmol/L (ref 135–145)

## 2019-03-27 LAB — CBC WITH DIFFERENTIAL/PLATELET
Abs Immature Granulocytes: 0.03 10*3/uL (ref 0.00–0.07)
Basophils Absolute: 0 10*3/uL (ref 0.0–0.1)
Basophils Relative: 0 %
Eosinophils Absolute: 0 10*3/uL (ref 0.0–0.5)
Eosinophils Relative: 0 %
HCT: 40.8 % (ref 39.0–52.0)
Hemoglobin: 14.5 g/dL (ref 13.0–17.0)
Immature Granulocytes: 0 %
Lymphocytes Relative: 15 %
Lymphs Abs: 1.2 10*3/uL (ref 0.7–4.0)
MCH: 33.4 pg (ref 26.0–34.0)
MCHC: 35.5 g/dL (ref 30.0–36.0)
MCV: 94 fL (ref 80.0–100.0)
Monocytes Absolute: 0.4 10*3/uL (ref 0.1–1.0)
Monocytes Relative: 4 %
Neutro Abs: 6.8 10*3/uL (ref 1.7–7.7)
Neutrophils Relative %: 81 %
Platelets: 219 10*3/uL (ref 150–400)
RBC: 4.34 MIL/uL (ref 4.22–5.81)
RDW: 12.1 % (ref 11.5–15.5)
WBC: 8.4 10*3/uL (ref 4.0–10.5)
nRBC: 0 % (ref 0.0–0.2)

## 2019-03-27 LAB — HEPATITIS PANEL, ACUTE
HCV Ab: <0.1 s/co ratio (ref 0.0–0.9)
Hep A IgM: NEGATIVE
Hep B C IgM: NEGATIVE
Hepatitis B Surface Ag: NEGATIVE

## 2019-03-27 LAB — MAGNESIUM: Magnesium: 2 mg/dL (ref 1.7–2.4)

## 2019-03-27 LAB — PHOSPHORUS: Phosphorus: 3.7 mg/dL (ref 2.5–4.6)

## 2019-03-27 MED ORDER — FENTANYL CITRATE (PF) 100 MCG/2ML IJ SOLN
INTRAMUSCULAR | Status: AC
  Filled 2019-03-27: qty 2

## 2019-03-27 MED ORDER — MIDAZOLAM HCL 2 MG/2ML IJ SOLN
INTRAMUSCULAR | Status: AC
  Filled 2019-03-27: qty 2

## 2019-03-27 MED ORDER — MIDAZOLAM HCL 2 MG/2ML IJ SOLN
INTRAMUSCULAR | Status: AC | PRN
  Administered 2019-03-27: 1 mg via INTRAVENOUS

## 2019-03-27 MED ORDER — FENTANYL CITRATE (PF) 100 MCG/2ML IJ SOLN
INTRAMUSCULAR | Status: AC | PRN
  Administered 2019-03-27: 25 ug via INTRAVENOUS

## 2019-03-27 MED ORDER — LIDOCAINE-EPINEPHRINE 1 %-1:100000 IJ SOLN
INTRAMUSCULAR | Status: AC
  Filled 2019-03-27: qty 1

## 2019-03-27 MED ORDER — LORAZEPAM 2 MG/ML IJ SOLN
0.5000 mg | Freq: Once | INTRAMUSCULAR | Status: AC
  Administered 2019-03-27: 0.5 mg via INTRAVENOUS
  Filled 2019-03-27: qty 1

## 2019-03-27 NOTE — Progress Notes (Signed)
Subjective: Randall Mcgee is a 78 y.o. y.o. male who presents today with cc of painful, discolored, thick toenails, callus and corns which interfere with daily activities. Pain is aggravated when wearing enclosed shoe gear and relieved with periodic professional debridement.  He states his right 3rd digit corn is most tender today.   Isaac Bliss, Rayford Halsted, MD is his PCP.   No Known Allergies  Objective:  Vascular Examination: Capillary refill time immediate x 10 digits.  Dorsalis pedis pulses 1/4 b/l.  Posterior tibial pulses absent b/l.  Digital hair absent x 10 digits.  Skin temperature gradient WNL b/l.  Dermatological Examination: Skin with normal turgor, texture and tone b/l.  Toenails 1-5 b/l discolored, thick, dystrophic with subungual debris and pain with palpation to nailbeds due to thickness of nails.  Hyperkeratotic lesions distal tip b/l 3rd digits and submet head 5 right foot. Right 3rd digit with tenderness to palpation.  No erythema, no edema, no drainage, no flocculence noted.   Musculoskeletal: Muscle strength 5/5 to all LE muscle groups.  Neurological: Sensation intact 5/5 b/l with 10 gram monofilament.  Vibratory sensation intact b/l.  Assessment: 1. Painful onychomycosis toenails 1-5 b/l 2.  Callus submet head 5 right foot 3.  Corns b/l 3rd digits 4.  PAD  Plan: 1. Toenails 1-5 b/l were debrided in length and girth without iatrogenic bleeding. 2. Hyperkeratotic lesion(s) distal tip b/l 3rd digits and submet head 5 right foot pared with sterile scalpel blade without incident. 3. Patient to continue soft, supportive shoe gear daily. 4. Patient to report any pedal injuries to medical professional immediately. 5. Follow up 3 months.  6. Patient/POA to call should there be a concern in the interim.

## 2019-03-27 NOTE — Progress Notes (Signed)
PROGRESS NOTE    Randall Mcgee  [id number] DOB: August 25, 1940 DOA: 03/25/2019 PCP: Isaac Bliss, Rayford Halsted, MD    Brief Narrative:  78 year old gentleman with history of hypertension, hyperlipidemia, anxiety and remote history of a scalp melanoma status post excision in 2017 came to the emergency room with about 1 to 2 weeks of stumbling and losing balance, not feeling well with low-grade fever since last few days, loss of concentration and memory lack.  Hemodynamically stable in the ER.  CT head showed multiple areas of acute hemorrhage with right posterior occipital lobe mass.  COVID-19 test was negative.  Chest x-ray was normal.  Admitted with metastatic brain cancer and hemorrhagic brain lesion. 03/26/2019: Overnight became paranoid, difficult to control behavior.   Assessment & Plan:   Principal Problem:   ICH (intracerebral hemorrhage) (HCC) Active Problems:   Hypertension   Hypercholesterolemia   Impaired glucose tolerance  Intracerebral hemorrhage/metastatic brain cancer with hemorrhage and vasogenic edema: Suspect malignant melanoma versus lung cancer. Neurologically stable now.  With increasing paranoia and restlessness, will recheck a stat head CT today to ensure stabilization of hemorrhage. Avoid all NSAID's and aspirin. Continue dexamethasone 4 mg every 6 hours IV.  Tylenol and tramadol for pain relief. CT scan of the chest and abdomen showed lesion near the bronchus with pressure on the bronchus, intervention radiology was consulted and recommended pulmonary consult. Called and discussed case with pulmonology for bronch and biopsy. Followed by radiation oncology and hematology oncology.  Accelerated hypertension: Blood pressures better now.  On his lisinopril and propanolol as well as added dose of amlodipine.   Hyperlipidemia: On a statin at home.  Continue.  Abnormal blood sugar: A1c 6.1.  No indication for treatment.  Anticipate elevated blood sugars with steroid  treatment, will hold off on initiating any treatment.  Paranoia/abnormal behavior: Suspect due to hospitalization, IV steroid use, brain lesion.  Continue same watching.  All-time fall precautions.  Will avoid narcotics.  He does occasionally takes small dose of Xanax at home that he should continue. We will recheck CT head given significant bleeding in the initial CT scan to ensure that it is not worsening.  Patient thinks his wife is controlled by [employer] and trying to control him.  Called and discussed with patient's son who lives in [place], updated.  We will continue to update.  DVT prophylaxis: SCDs Code Status: Full code Family Communication: Son, Geophysical data processor. Disposition Plan: Pending.    Consultants:   Oncology  Neurology  Radiation oncology  Pulmonary  Procedures:   None  Antimicrobials:   None   Subjective:  Seen and examined.  Overnight events noted.  Severe paranoia and agitation last night.  Difficult to control and stay in the bed. He is fixated on the idea that we are conspiring with [employer] and poisoning him. After much counseling, somehow able to follow commands.   Objective: Vitals:   03/26/19 1700 03/26/19 2102 03/27/19 0243 03/27/19 0747  BP: (!) 125/111 (!) 172/83 129/76 (!) 152/84  Pulse: 71 86 70 82  Resp: 17 18 18 18   Temp: 98.6 F (37 C) 98.9 F (37.2 C) 98.1 F (36.7 C) 98.1 F (36.7 C)  TempSrc: Oral Oral Oral Oral  SpO2: 95% 95% 92% 95%   No intake or output data in the 24 hours ending 03/27/19 1124 There were no vitals filed for this visit.  Examination:  General exam: Confused, disoriented, paranoid. Respiratory system: Clear to auscultation. Respiratory effort normal. Cardiovascular system: S1 &  S2 heard, RRR. No JVD, murmurs, rubs, gallops or clicks. No pedal edema. Gastrointestinal system: Abdomen is nondistended, soft and nontender. No organomegaly or masses felt. Normal bowel sounds heard. Central nervous  system: Alert but paranoid and confused.  Left homonymous hemianopia.   Extremities: Symmetric 5 x 5 power. Skin: No rashes, lesions or ulcers Psychiatry: Judgement and insight appear compromised.  Mood & affect anxious and paranoid.    Data Reviewed: I have personally reviewed following labs and imaging studies  CBC: Recent Labs  Lab 03/25/19 1354 03/27/19 0825  WBC 7.0 8.4  NEUTROABS  --  6.8  HGB 13.4 14.5  HCT 39.6 40.8  MCV 100.0 94.0  PLT 201 [id number]   Basic Metabolic Panel: Recent Labs  Lab 03/25/19 1354 03/27/19 0825  NA 141 139  K 4.3 4.1  CL 106 103  CO2 26 22  GLUCOSE 183* 170*  BUN 15 25*  CREATININE 1.26* 1.18  CALCIUM 9.7 9.7  MG  --  2.0  PHOS  --  3.7   GFR: CrCl cannot be calculated (Unknown ideal weight.). Liver Function Tests: Recent Labs  Lab 03/25/19 1354  AST 48*  ALT 24  ALKPHOS 41  BILITOT 0.6  PROT 6.8  ALBUMIN 3.8   No results for input(s): LIPASE, AMYLASE in the last 168 hours. No results for input(s): AMMONIA in the last 168 hours. Coagulation Profile: Recent Labs  Lab 03/25/19 2110  INR 1.1   Cardiac Enzymes: Recent Labs  Lab 03/25/19 2110  CKTOTAL 164  CKMB 2.7   BNP (last 3 results) No results for input(s): PROBNP in the last 8760 hours. HbA1C: Recent Labs    03/25/19 1354  HGBA1C 6.1*   CBG: No results for input(s): GLUCAP in the last 168 hours. Lipid Profile: No results for input(s): CHOL, HDL, LDLCALC, TRIG, CHOLHDL, LDLDIRECT in the last 72 hours. Thyroid Function Tests: No results for input(s): TSH, T4TOTAL, FREET4, T3FREE, THYROIDAB in the last 72 hours. Anemia Panel: No results for input(s): VITAMINB12, FOLATE, FERRITIN, TIBC, IRON, RETICCTPCT in the last 72 hours. Sepsis Labs: No results for input(s): PROCALCITON, LATICACIDVEN in the last 168 hours.  Recent Results (from the past 240 hour(s))  Novel Coronavirus, NAA ([facility])     Status: None   Collection Time: 03/25/19 12:00 AM   Specimen:  Oropharyngeal(OP) collection in vial transport medium   OROPHARYNGEA  TESTING  Result Value Ref Range Status   SARS-CoV-2, NAA Not Detected Not Detected Final    Comment: This nucleic acid amplification test was developed and its perfomance characteristics determined by [employer]. Nucleic acid amplification tests include PCR and TMA. This test has not been FDA cleared or approved. This test has been authorized by FDA under an Emergency Use Authorization (EUA). This test is only authorized for the duration of time the declaration that circumstances exist justifying the authorization of the emergency use of in vitro diagnostic tests for detection of SARS-CoV-2 virus and/or diagnosis of COVID-19 infection under section 564(b)(1) of the Act, 21 U.S.C. [id number]) (1), unless the authorization is terminated or revoked sooner. When diagnostic testing is negative, the possibility of a false negative result should be considered in the context of a patient's recent exposures and the presence of clinical signs and symptoms consistent with COVID-19. An individual without symptoms of COVID-19 and who is not shedding SARS-CoV-2 virus would  expect to have a negative (not detected) result in this assay.   SARS CORONAVIRUS 2 (TAT 6-24 HRS) Nasopharyngeal Nasopharyngeal Swab  Status: None   Collection Time: 03/25/19  7:41 PM   Specimen: Nasopharyngeal Swab  Result Value Ref Range Status   SARS Coronavirus 2 NEGATIVE NEGATIVE Final    Comment: (NOTE) SARS-CoV-2 target nucleic acids are NOT DETECTED. The SARS-CoV-2 RNA is generally detectable in upper and lower respiratory specimens during the acute phase of infection. Negative results do not preclude SARS-CoV-2 infection, do not rule out co-infections with other pathogens, and should not be used as the sole basis for treatment or other patient management decisions. Negative results must be combined with clinical observations, patient  history, and epidemiological information. The expected result is Negative. Fact Sheet for Patients: SugarRoll.be Fact Sheet for Healthcare Providers: https://www.woods-mathews.com/ This test is not yet approved or cleared by the [place] FDA and  has been authorized for detection and/or diagnosis of SARS-CoV-2 by FDA under an Emergency Use Authorization (EUA). This EUA will remain  in effect (meaning this test can be used) for the duration of the COVID-19 declaration under Section 56 4(b)(1) of the Act, 21 U.S.C. section 360bbb-3(b)(1), unless the authorization is terminated or revoked sooner. Performed at [facility], [place] [address] [place], [place] [address]   Culture, blood (routine x 2)     Status: None (Preliminary result)   Collection Time: 03/25/19  9:10 PM   Specimen: BLOOD RIGHT HAND  Result Value Ref Range Status   Specimen Description BLOOD RIGHT HAND  Final   Special Requests   Final    BOTTLES DRAWN AEROBIC AND ANAEROBIC Blood Culture results may not be optimal due to an inadequate volume of blood received in culture bottles   Culture   Final    NO GROWTH < 24 HOURS Performed at [facility], [place] [address] [place],  [address]    Report Status PENDING  Incomplete         Radiology Studies: Ct Head Wo Contrast  Result Date: 03/25/2019 CLINICAL DATA:  Ct head wo, Pt's wife reports that Sunday morning he woke her up and was extremely disoriented and confused. Pt reported to wife he had a bad headache and temperature. EXAM: CT HEAD WITHOUT CONTRAST TECHNIQUE: Contiguous axial images were obtained from the base of the skull through the vertex without intravenous contrast. COMPARISON:  None. FINDINGS: Brain: There are 3 areas of acute masslike hemorrhage in the bilateral occipital and left temporal lobes. The largest area in the right occipital lobe measures 5.0 x 3.2 cm. A small area in the posterior left  occipital lobe measures 1.6 by 1.8 cm. And another area in the left temporal lobe measures 1.4 by 1.1 cm. There is mass effect on the occipital hormone of the right lateral ventricle secondary to edema surrounding the hemorrhage in the posterior right occipital lobe. No midline shift. Mild enlargement of the ventricles is likely secondary to ex vacuo dilation from brain atrophy. Vascular: No hyperdense vessel or unexpected calcification. Skull: Normal. Negative for fracture or focal lesion. Sinuses/Orbits: Mucosal thickening in the bilateral maxillary and ethmoid sinuses. Other: None. IMPRESSION: Multiple areas of acute hemorrhage in the bilateral occipital and left temporal lobes. The largest area in the right posterior occipital lobe measures 5 x 3 cm with some mass effect on the right lateral ventricle. Differential considerations include hemorrhagic metastasis and emboli. Consider further evaluation with brain MRI. These results were called by telephone at the time of interpretation on 03/25/2019 at 6:55 pm to Dr. Janeece Fitting , who verbally acknowledged these results. Electronically Signed  By: Audie Pinto M.D.   On: 03/25/2019 19:05   Ct Chest W Contrast  Result Date: 03/26/2019 CLINICAL DATA:  Liver/biliary neoplasm. EXAM: CT CHEST, ABDOMEN, AND PELVIS WITH CONTRAST TECHNIQUE: Multidetector CT imaging of the chest, abdomen and pelvis was performed following the standard protocol during bolus administration of intravenous contrast. CONTRAST:  167mL ISOVUE-300 IOPAMIDOL (ISOVUE-300) INJECTION 61% COMPARISON:  Abdominal ultrasound dated 03/25/2019 FINDINGS: CT CHEST FINDINGS Cardiovascular: Normal heart size. No pericardial effusion. Calcific atherosclerotic disease of the coronary arteries and to mild degree the aorta. Mediastinum/Nodes: Marked bilateral mediastinal lymphadenopathy with abnormal lymph nodes in left paratracheal, left prevascular, left precarinal, central subcarinal stations, as well as  right hilar and peribronchial stations. Additional abnormal lymph node in left supra clavicular location. The largest conglomerate of abnormal appearing lymph nodes in the subcarinal region measures 2.9 x 5.4 cm. The trachea is patent. Lungs/Pleura: There is a large subpleural/peri diaphragmatic soft tissue mass in the right lower lobe measuring 3.4 by 4.1 by 3.5 cm. Tiny 2-3 mm perifissural soft tissue nodule is seen in the right middle lobe, image 103/142, sequence 4. A second soft tissue nodule in the more inferior right middle lobe measures 3 mm, image 109/142, sequence 4. There are areas of linear atelectasis in the bilateral lung bases and lingula. Musculoskeletal: No chest wall mass or suspicious bone lesions identified. CT ABDOMEN PELVIS FINDINGS Hepatobiliary: No focal liver abnormality is seen. Status post cholecystectomy. No biliary dilatation. Pancreas: Unremarkable. No pancreatic ductal dilatation or surrounding inflammatory changes. Spleen: Normal in size without focal abnormality. Adrenals/Urinary Tract: 1.4 cm indeterminate right adrenal mass. The left adrenal gland is normal. The kidneys are normal. No left renal masses appreciated. No hydronephrosis or nephrolithiasis. There is a right peritrigonal bladder diverticulum. Stomach/Bowel: Stomach is within normal limits. Appendix appears normal. No evidence of bowel wall thickening, distention, or inflammatory changes. Left colonic diverticulosis without evidence of diverticulitis. Vascular/Lymphatic: Aortic atherosclerosis. No enlarged abdominal or pelvic lymph nodes. Reproductive: Enlarged prostate gland. Other: Bilateral fat containing inguinal hernias. Musculoskeletal: No acute osseous findings. Spondylosis of the spine. IMPRESSION: 1. 3.4 cm right lower lobe subpleural/peri diaphragmatic soft tissue pulmonary mass, highly suspicious for primary pulmonary malignancy or metastatic lesion. 2. Marked bilateral mediastinal and right hilar/peribronchial  lymphadenopathy. 3. The mid esophagus is compressed or contiguous with the left paratracheal lymphadenopathy/mass. Esophageal involvement cannot be excluded. 4. 1.4 cm indeterminate right adrenal mass. 5. Left colonic diverticulosis without evidence of diverticulitis. 6. Bilateral fat containing inguinal hernias. 7. Enlarged prostate gland. Please correlate to serum PSA values. Aortic Atherosclerosis (ICD10-I70.0). These results will be called to the ordering clinician or representative by the Radiologist Assistant, and communication documented in the PACS or zVision Dashboard. Electronically Signed   By: Fidela Salisbury M.D.   On: 03/26/2019 14:00   Mr Brain W And Wo Contrast  Result Date: 03/26/2019 CLINICAL DATA:  Follow-up examination for acute intracranial hemorrhage. Altered mental status. EXAM: MRI HEAD WITHOUT AND WITH CONTRAST TECHNIQUE: Multiplanar, multiecho pulse sequences of the brain and surrounding structures were obtained without and with intravenous contrast. CONTRAST:  10 cc of Gadavist. COMPARISON:  Prior CT from 03/25/2019. FINDINGS: Brain: Mild age-related cerebral atrophy with chronic microvascular ischemic disease. Few scattered superimposed remote lacunar infarcts noted within the right basal ganglia and left thalamus. Previously identified intraparenchymal hematoma centered at the right parieto-occipital region again seen, measuring approximately 4.1 x 4.3 x 3.9 cm (estimated volume 34 cc). Underlying metastatic lesion is presumably present. Surrounding vasogenic edema throughout the right  parieto-occipital region with mild mass effect on the adjacent atrium of the right lateral ventricle. No definite intraventricular extension of hemorrhage. Associated trace right-to-left midline shift of the septum pellucidum. Basilar cisterns remain patent. Multiple additional scattered enhancing masses seen involving the bilateral cerebral hemispheres, all of which demonstrate associated  susceptibility artifact, consistent with hemorrhagic metastases. These are seen as follows; punctate enhancing lesion at the posterior left centrum semi ovale (series 18, image 45), 8 mm enhancing lesion at the right parietal cortex (series 18, image 43), 5 mm lesion involving the cortical gray matter of the anterior left frontal lobe (series 18, image 42), 7 mm lesion along the cortex of the anterior right frontal lobe (series 18, image 34), 11 mm dural-based lesion at the right occipital lobe (series 18, image 28), adjacent punctate 4 mm lesion (series 18, image 25), 14 mm lesion at the anterior left temporal lobe (series 18, image 25), 20 x 19 mm dural-based lesion at the left occipital lobe (series 18, image 23). Additional possible punctate lesion at the anterior temporal left temporal pole (series 18, image 18), not entirely certain given small size. Relative sparing of the brainstem and cerebellum. No evidence for acute infarct. No extra-axial fluid collection. No hydrocephalus or ventricular trapping. No other acute intracranial abnormality. Vascular: Major intracranial vascular flow voids are maintained. Skull and upper cervical spine: Craniocervical junction within normal limits. Degenerative spondylolysis noted at C2-3 without significant stenosis. No focal marrow replacing lesion. Postsurgical changes noted at the left frontal scalp. Sinuses/Orbits: Patient status post bilateral ocular lens replacement. Globes and orbital soft tissues demonstrate no acute finding. Scattered mucosal thickening noted within the ethmoidal air cells and maxillary sinuses with superimposed small maxillary sinus retention cyst. No mastoid effusion. Inner ear structures grossly normal. Other: None. IMPRESSION: 1. Multiple scattered intracranial hemorrhagic metastases as detailed above. One of these lesions at the parieto-occipital region has bled with associated 34 cc intraparenchymal hematoma. Associated regional mass effect  with trace right-to-left midline shift. 2. Underlying age-related cerebral atrophy with mild chronic small vessel ischemic disease. Electronically Signed   By: Jeannine Boga M.D.   On: 03/26/2019 03:13   US Abdomen Complete  Result Date: 03/25/2019 CLINICAL DATA:  Abnormal LFTs. EXAM: ABDOMEN ULTRASOUND COMPLETE COMPARISON:  None. FINDINGS: Gallbladder: The gallbladder surgically absent. Common bile duct: Diameter: 3 mm Liver: The liver echogenicity appears increased. The hepatic echotexture is coarsened and heterogeneous. Portal vein is patent on color Doppler imaging with normal direction of blood flow towards the liver. IVC: No abnormality visualized. Pancreas: The pancreas is poorly evaluated secondary to overlying bowel gas. Spleen: Size and appearance within normal limits. Right Kidney: Length: 9.3 cm. Echogenicity within normal limits. No mass or hydronephrosis visualized. Left Kidney: Length: 11 cm. There is a hypoechoic 3.3 x 2.9 by 3 cm mass in the interpolar region of the left kidney. This mass appears to demonstrate some internal color Doppler flow. There is no hydronephrosis. Abdominal aorta: No aneurysm visualized. Other findings: None. IMPRESSION: 1. Limited study secondary to overlying bowel gas and patient body habitus. 2. Status post cholecystectomy. 3. Coarsened heterogeneous appearance of the liver is nonspecific and may be secondary to underlying hepatocellular disease or hepatic steatosis. 4. Indeterminate 3 cm mass involving the interpolar region of the left kidney. Follow-up with a nonemergent outpatient contrast enhanced CT or MRI is recommended for further evaluation of this finding. 5. Pancreas not well evaluated secondary to overlying bowel gas. Electronically Signed   By: Jamie Kato.D.  On: 03/25/2019 23:10   Ct Abdomen Pelvis W Contrast  Result Date: 03/26/2019 CLINICAL DATA:  Liver/biliary neoplasm. EXAM: CT CHEST, ABDOMEN, AND PELVIS WITH CONTRAST TECHNIQUE:  Multidetector CT imaging of the chest, abdomen and pelvis was performed following the standard protocol during bolus administration of intravenous contrast. CONTRAST:  179mL ISOVUE-300 IOPAMIDOL (ISOVUE-300) INJECTION 61% COMPARISON:  Abdominal ultrasound dated 03/25/2019 FINDINGS: CT CHEST FINDINGS Cardiovascular: Normal heart size. No pericardial effusion. Calcific atherosclerotic disease of the coronary arteries and to mild degree the aorta. Mediastinum/Nodes: Marked bilateral mediastinal lymphadenopathy with abnormal lymph nodes in left paratracheal, left prevascular, left precarinal, central subcarinal stations, as well as right hilar and peribronchial stations. Additional abnormal lymph node in left supra clavicular location. The largest conglomerate of abnormal appearing lymph nodes in the subcarinal region measures 2.9 x 5.4 cm. The trachea is patent. Lungs/Pleura: There is a large subpleural/peri diaphragmatic soft tissue mass in the right lower lobe measuring 3.4 by 4.1 by 3.5 cm. Tiny 2-3 mm perifissural soft tissue nodule is seen in the right middle lobe, image 103/142, sequence 4. A second soft tissue nodule in the more inferior right middle lobe measures 3 mm, image 109/142, sequence 4. There are areas of linear atelectasis in the bilateral lung bases and lingula. Musculoskeletal: No chest wall mass or suspicious bone lesions identified. CT ABDOMEN PELVIS FINDINGS Hepatobiliary: No focal liver abnormality is seen. Status post cholecystectomy. No biliary dilatation. Pancreas: Unremarkable. No pancreatic ductal dilatation or surrounding inflammatory changes. Spleen: Normal in size without focal abnormality. Adrenals/Urinary Tract: 1.4 cm indeterminate right adrenal mass. The left adrenal gland is normal. The kidneys are normal. No left renal masses appreciated. No hydronephrosis or nephrolithiasis. There is a right peritrigonal bladder diverticulum. Stomach/Bowel: Stomach is within normal limits.  Appendix appears normal. No evidence of bowel wall thickening, distention, or inflammatory changes. Left colonic diverticulosis without evidence of diverticulitis. Vascular/Lymphatic: Aortic atherosclerosis. No enlarged abdominal or pelvic lymph nodes. Reproductive: Enlarged prostate gland. Other: Bilateral fat containing inguinal hernias. Musculoskeletal: No acute osseous findings. Spondylosis of the spine. IMPRESSION: 1. 3.4 cm right lower lobe subpleural/peri diaphragmatic soft tissue pulmonary mass, highly suspicious for primary pulmonary malignancy or metastatic lesion. 2. Marked bilateral mediastinal and right hilar/peribronchial lymphadenopathy. 3. The mid esophagus is compressed or contiguous with the left paratracheal lymphadenopathy/mass. Esophageal involvement cannot be excluded. 4. 1.4 cm indeterminate right adrenal mass. 5. Left colonic diverticulosis without evidence of diverticulitis. 6. Bilateral fat containing inguinal hernias. 7. Enlarged prostate gland. Please correlate to serum PSA values. Aortic Atherosclerosis (ICD10-I70.0). These results will be called to the ordering clinician or representative by the Radiologist Assistant, and communication documented in the PACS or zVision Dashboard. Electronically Signed   By: Fidela Salisbury M.D.   On: 03/26/2019 14:00   Dg Chest Portable 1 View  Result Date: 03/25/2019 CLINICAL DATA:  Chest pain and shortness of breath. EXAM: PORTABLE CHEST 1 VIEW COMPARISON:  Chest x-ray dated June 13, 2011. FINDINGS: The heart size and mediastinal contours are within normal limits. Normal pulmonary vascularity. No focal consolidation, pleural effusion, or pneumothorax. Unchanged mild elevation of the left hemidiaphragm. No acute osseous abnormality. IMPRESSION: No active disease. Electronically Signed   By: Titus Dubin M.D.   On: 03/25/2019 16:43        Scheduled Meds:  ALPRAZolam  0.5 mg Oral QHS   amLODipine  5 mg Oral Daily   atorvastatin   20 mg Oral q1800   dexamethasone (DECADRON) injection  4 mg Intravenous Q6H   lisinopril  40 mg Oral Daily   loratadine  10 mg Oral Daily   propranolol  40 mg Oral BID   sodium chloride flush  3 mL Intravenous Once   Continuous Infusions:   LOS: 2 days    Time spent: 35 minutes    Barb Merino, MD [facility] Pager [phone]  If 7PM-7AM, please contact night-coverage [email or web address] Password [email or web address] 03/27/2019, 11:24 AM

## 2019-03-27 NOTE — Consult Note (Signed)
NAME:  Randall Mcgee, MRN:  [id number], DOB:  [birth year], LOS: 2 ADMISSION DATE:  03/25/2019, CONSULTATION DATE:  03/27/19 REFERRING MD:  Dr. Sloan Leiter, CHIEF COMPLAINT:  Headache   Brief History   78 y.o. M with PMH of scalp melanoma s/p MOHS surgery 5 yrs ago and then excision in 2017 who presented with around one week of headache and was admitted with [place] on CT.  MRI showed multiple, scattered,  hemorrhagic metastases with parieto-occipital hematoma measuring 34cc with regional mass effect and trace mid-line shift.   Subsequent chest CT showed 3.4 cm RLL sub-pleural, diaphragmatic mass.   IR was consulted for bx, but recommended Pulmonology consult or PET scan prior to  Percutaneous bx.   History of present illness   78 y.o. F with PMH PMH of HTN, Anxiety, HL, scalp melanoma s/p MOHS surgery 5 yrs ago and then excision in 2017 who began experiencing occipital HA approximately one week ago with fatigue, weight loss and low grade fever.  He was tested for Covid-19, but developed confusion and worsening occipital HA several days ago, is on [place] and had been taking NSAIDS.  He presented to the ED and was found to have acute bilateral occipital and L temporal ICH on CT.  Pt was admitted and MRI showed multiple, scattered  intracranial hemorrhagic mets with regional mass effect in the parieto-occipital region. Neuro consulted and started Dexamethasone.   CT chest with 3.4cc RLL sub-pleural diaphragmatic mass, Pulmonology consulted for possible bronchoscopy.    Pt is currently tearful and stating that the hospital is being run by communists who have recruited his wife.  He has been on his home Xanax along with steroids.  He denies any shortness of breath or pain.   Per Epic, pt has a remote history of smoking.   Past Medical History  Scalp melanoma, HTN, HL, anxiety  Significant Hospital Events   9/1 Admission to medicine service  Consults:  Neurology Radiation Oncology Medical Oncology PCCM   Procedures:    Significant Diagnostic Tests:  9/1 CT head >> acute bilateral occipital and L temporal ICH  9/2 MRI Head>> multiple, scattered,  hemorrhagic metastases with parieto-occipital hematoma measuring 34cc with regional mass effect and trace mid-line shift 9/2 CT Chest>>3.4 cm right lower lobe subpleural/peri diaphragmatic soft tissue pulmonary mass, highly suspicious for primary pulmonary malignancy or metastatic lesion, marked bilateral mediastinal lyphadenopathy  Micro Data:  9/1 BCx2>> 9/1 Sars-CoV-2>>neg  Antimicrobials:    Interim history/subjective:  Pt. More confused overnight and this morning, has been getting Xanax.  He denies pain, feels that the hospital is being run by communists  Objective   Blood pressure (!) 152/84, pulse 82, temperature 98.1 F (36.7 C), temperature source Oral, resp. rate 18, SpO2 95 %.        Intake/Output Summary (Last 24 hours) at 03/27/2019 [id number] Last data filed at 03/26/2019 1100 Gross per 24 hour  Intake -  Output 300 ml  Net -300 ml   General:  Elderly male, sitting on the side of the bed HEENT: MM pink/moist Neuro: Moving all extremities, ambulatory, speech clear, no focal deficits CV: s1s2, rrr no m/r/g PULM:  CTAB, no increased WOB on room air GI: soft, bsx4 active  Extremities: warm/dry, no edema  Skin: no rashes or lesions Psych: tearful, paranoid, oriented to person, does seem to understand his diagnosis   Resolved Hospital Problem list     Assessment & Plan:   RLL mass -large 3.1x4.1x3.5cm subpleural, peridiaphragmatic -IR would want  PET scan before percutatneous bx and recommended pulm consult P: -plan for Super D imaging and navigation Bronch with biopsy    Hemorrhagic metastatic brain lesions -continue Dexamethasone -Radiation and Neuro oncology following, awaiting tissue bx for confirmation of primary malignancy -holding NSAIDS and anti-platelet therapy    Delirium -suspect this is secondary to  steroids, benzo's, hospital stay in the setting of mild edema on MRI -Minimize benzo's as able, continue sitter and reassurance and    Best practice:  Diet: NPO Pain/Anxiety/Delirium protocol (if indicated): tramadol VAP protocol (if indicated): n/a DVT prophylaxis: SCD's GI prophylaxis: n/a Glucose control: n/a Mobility: as tolerated Code Status: Full  Family Communication:  Disposition:   Labs   CBC: Recent Labs  Lab 03/25/19 1354  WBC 7.0  HGB 13.4  HCT 39.6  MCV 100.0  PLT 123456    Basic Metabolic Panel: Recent Labs  Lab 03/25/19 1354  NA 141  K 4.3  CL 106  CO2 26  GLUCOSE 183*  BUN 15  CREATININE 1.26*  CALCIUM 9.7   GFR: CrCl cannot be calculated (Unknown ideal weight.). Recent Labs  Lab 03/25/19 1354  WBC 7.0    Liver Function Tests: Recent Labs  Lab 03/25/19 1354  AST 48*  ALT 24  ALKPHOS 41  BILITOT 0.6  PROT 6.8  ALBUMIN 3.8   No results for input(s): LIPASE, AMYLASE in the last 168 hours. No results for input(s): AMMONIA in the last 168 hours.  ABG No results found for: PHART, PCO2ART, PO2ART, HCO3, TCO2, ACIDBASEDEF, O2SAT   Coagulation Profile: Recent Labs  Lab 03/25/19 2110  INR 1.1    Cardiac Enzymes: Recent Labs  Lab 03/25/19 2110  CKTOTAL 164  CKMB 2.7    HbA1C: Hgb A1c MFr Bld  Date/Time Value Ref Range Status  03/25/2019 01:54 PM 6.1 (H) 4.8 - 5.6 % Final    Comment:    (NOTE) Pre diabetes:          5.7%-6.4% Diabetes:              >6.4% Glycemic control for   <7.0% adults with diabetes   01/28/2016 08:50 AM 6.1 4.6 - 6.5 % Final    Comment:    Glycemic Control Guidelines for People with Diabetes:Non Diabetic:  <6%Goal of Therapy: <7%Additional Action Suggested:  >8%     CBG: No results for input(s): GLUCAP in the last 168 hours.  Review of Systems:   Unable to obtain due to confusion  Past Medical History  He,  has a past medical history of Anxiety, Cancer ([place]) (2017), History of chicken pox,  Hyperlipidemia, Hypertension, and Right inguinal hernia.   Surgical History    Past Surgical History:  Procedure Laterality Date  . APPLICATION OF A-CELL OF EXTREMITY N/A 07/13/2016   Procedure: APPLICATION OF A-CELL;  Surgeon: Wallace Going, DO;  Location: [facility];  Service: Plastics;  Laterality: N/A;  . APPLICATION OF A-CELL OF HEAD/NECK N/A 06/28/2016   Procedure: APPLICATION OF A-CELL OF HEAD/NECK;  Surgeon: Wallace Going, DO;  Location: [facility];  Service: Plastics;  Laterality: N/A;  . APPLICATION OF A-CELL OF HEAD/NECK N/A 07/26/2016   Procedure: APPLICATION OF ACELL OF HEAD;  Surgeon: Wallace Going, DO;  Location: [facility];  Service: Plastics;  Laterality: N/A;  . APPLICATION OF A-CELL OF HEAD/NECK N/A 08/31/2016   Procedure: APPLICATION OF A-CELL OF HEAD/NECK;  Surgeon: Wallace Going, DO;  Location: [facility];  Service: Clinical cytogeneticist;  Laterality: N/A;  . CATARACT EXTRACTION, BILATERAL    . CHOLECYSTECTOMY    . ENDARTERECTOMY  06/20/11  . ENDARTERECTOMY  06/20/2011   Procedure: ENDARTERECTOMY CAROTID;  Surgeon: Elam Dutch, MD;  [facility];  Service: Vascular;  Laterality: Right;  Right Carotid endarterectomy with dacron patch angioplasty   . INGUINAL HERNIA REPAIR Right 08/16/2018   Procedure: RIGHT INGUINAL HERNIA REPAIR WITH MESH;  Surgeon: Georganna Skeans, MD;  Location: [facility];  Service: General;  Laterality: Right;  . MASS EXCISION N/A 06/28/2016   Procedure: EXCISION SCALP MELANOMA;  Surgeon: Wallace Going, DO;  Location: [facility];  Service: Plastics;  Laterality: N/A;  . MASS EXCISION N/A 07/13/2016   Procedure: RE- EXCISION OF SCALP MELANOMA;  Surgeon: Wallace Going, DO;  Location: [facility];  Service: Plastics;  Laterality: N/A;  . MASS EXCISION N/A 07/26/2016   Procedure: RE-EXCISION OF SCALP MELANOMA FOR POSITIVE  MARGAIN;  Surgeon: Wallace Going, DO;  Location: [facility];  Service: Plastics;  Laterality: N/A;  . SKIN SPLIT GRAFT Left 08/31/2016   Procedure: SKIN GRAFT SPLIT THICKNESS TO HIS SCALP FROM HIS THIGH;  Surgeon: Wallace Going, DO;  Location: [facility];  Service: Plastics;  Laterality: Left;  . TONSILLECTOMY    . TONSILLECTOMY       Social History   reports that he has never smoked. He has never used smokeless tobacco. He reports that he does not drink alcohol or use drugs.   Family History   His family history includes Heart disease in his mother.   Allergies No Known Allergies   Home Medications  Prior to Admission medications   Medication Sig Start Date End Date Taking? Authorizing Provider  ALPRAZolam (XANAX) 0.5 MG tablet TAKE 1 TABLET BY MOUTH TWICE DAILY AS NEEDED FOR SLEEP Patient taking differently: Take 0.5 mg by mouth See admin instructions. Take 0.5 mg by mouth at bedtime and an additional 0.5 mg once a day as needed for anxiety 02/04/19  Yes Isaac Bliss, Rayford Halsted, MD  aspirin 81 MG tablet Take 81 mg by mouth daily.   Yes [provider]  cetirizine (ZYRTEC) 10 MG tablet Take 1 tablet (10 mg total) by mouth daily. Patient taking differently: Take 10 mg by mouth at bedtime.  06/19/18  Yes Isaac Bliss, Rayford Halsted, MD  Cyanocobalamin (VITAMIN B-12 PO) Take 1,000 mg by mouth daily.   Yes [provider]  ibuprofen (ADVIL) 200 MG tablet Take 200 mg by mouth every 6 (six) hours as needed for headache or mild pain.   Yes [provider]  indomethacin (INDOCIN) 25 MG capsule TAKE 1 CAPSULE(25 MG) BY MOUTH THREE TIMES DAILY AS NEEDED Patient taking differently: Take 25 mg by mouth See admin instructions. Take 25 mg by mouth three times a week as needed for gout flares 02/20/19  Yes Isaac Bliss, Rayford Halsted, MD  lisinopril (PRINIVIL,ZESTRIL) 40 MG tablet Take 1 tablet (40 mg total) by mouth daily. 09/19/18  Yes  Isaac Bliss, Rayford Halsted, MD  Multiple Vitamins-Minerals (CENTRUM SILVER PO) Take 1 tablet by mouth daily.    Yes [provider]  propranolol (INDERAL) 20 MG tablet Take 40 mg by mouth 2 (two) times daily.   Yes [provider]  simvastatin (ZOCOR) 40 MG tablet Take 1 tablet (40 mg total) by mouth every evening. Patient taking differently: Take 40 mg by mouth at bedtime.  09/17/18  Yes Isaac Bliss, Rayford Halsted, MD  fluorouracil (EFUDEX) 5 % cream Apply 1 application topically.  03/12/19   [provider]  propranolol (INDERAL) 40 MG tablet Take 1 tablet (40 mg total) by mouth 2 (two) times daily. TAKE 1 TABLET BY MOUTH TWICE DAILY( REPLACES NADOLOL) Patient not taking: Reported on 03/25/2019 02/19/19   Isaac Bliss, Rayford Halsted, MD  sildenafil (REVATIO) 20 MG tablet TAKE 2 TABLETS BY MOUTH DAILY AS DIRECTED Patient not taking: Reported on 03/25/2019 06/19/18   Isaac Bliss, Rayford Halsted, MD     Otilio Carpen Rye Dorado, PA-C [facility]  Pager# [phone], if no answer [phone]

## 2019-03-27 NOTE — Progress Notes (Signed)
In speaking with Dr. Tammi Klippel after further review of the patient's case and imaging, based on the patient's history and imaging characteristics of the brain lesions, it is felt that his brain disease is most likely secondary to metastatic melanoma.  Given the limited number of brain lesions and limited extracranial disease it is felt that his prognosis could be good and he is likely a candidate for [facility] for management of the brain disease.  The recommendation is to proceed with an outpatient 3T SRS protocol MRI brain scan for treatment planning purposes once the patient has been discharged from the hospital.  Once we have confirmed that the patient remains a good candidate for stereotactic radiosurgery we will proceed with treatment planning accordingly and we will coordinate this treatment with neurosurgery.  We will plan to see the patient for an outpatient follow-up visit in the clinic following his SRS protocol MRI to review the results and further discuss treatment recommendations at that time, prior to proceeding with CT simulation in anticipation of [place] treatment in the near future as long as the patient remains in agreement.  Nicholos Johns, MMS, PA-C [facility] [place]: [phone]  Fax: [phone]

## 2019-03-27 NOTE — Progress Notes (Signed)
Pt got up and refused to say anything. Redirected to bed but won't go back. Offer go to restroom but refused. Assisted to ambulate in hallway and wanted to go to different room.  Back in own room but continues to get up from bed. Will monitor.

## 2019-03-27 NOTE — Progress Notes (Signed)
Patient still refusing to wear telemetry box.  I asked at 8pm and educated; tele called and asked me to check again and patient is still refusing.

## 2019-03-27 NOTE — Progress Notes (Signed)
Pt talking about the prior RN "admitting to being the antichrist." Pt convinced that "this whole experience is a covert operation. " Pt states to his family that "he is trying to expose [place] for what we are." Pt refusing telemetry. MD aware.  Trying to establish trust with pt. Providing a calm, comforting, listening environment and giving patient some control of his environment. Pt on phone with family. Will continue to monitor patient and provided compassionate care.  Lucius Conn, RN

## 2019-03-27 NOTE — Consult Note (Signed)
Chief Complaint: Patient was seen in consultation today for lymphadenopathy  Referring Physician(s): Dr. Julien Nordmann  Supervising Physician: Sandi Mariscal  Patient Status: [facility] - In-pt  History of Present Illness: Randall Mcgee is a 78 y.o. male with past medical history of anxiety, HLD, HTN, and scalp melanoma s/p excision in 2017 now admitted with 1-2 week history of gait imbalance, stumbling, and confusion. CT Head showed multiple areas of acute hemorrhage with a right posterior occipital lobe mass.    CT Chest Abdomen Pelvis showed: 1. 3.4 cm right lower lobe subpleural/peri diaphragmatic soft tissue pulmonary mass, highly suspicious for primary pulmonary malignancy or metastatic lesion. 2. Marked bilateral mediastinal and right hilar/peribronchial lymphadenopathy. 3. The mid esophagus is compressed or contiguous with the left paratracheal lymphadenopathy/mass. Esophageal involvement cannot be excluded. 4. 1.4 cm indeterminate right adrenal mass. 5. Left colonic diverticulosis without evidence of diverticulitis. 6. Bilateral fat containing inguinal hernias. 7. Enlarged prostate gland. Please correlate to serum PSA values.  IR consulted for lung mass biopsy, however PET requested by Dr. Pascal Lux prior to procedure. Additional review of CT by Dr. Pascal Lux again today shows cervical lymphadenopathy.  Plan is to proceed with cervical lymph node biopsy.   Patient is NPO. INR 9/1 1.1 Not on blood thinners.    Past Medical History:  Diagnosis Date   Anxiety    Cancer ([place]) 2017   melanoma on scalp   History of chicken pox    Hyperlipidemia    Hypertension    Right inguinal hernia     Past Surgical History:  Procedure Laterality Date   APPLICATION OF A-CELL OF EXTREMITY N/A 07/13/2016   Procedure: APPLICATION OF A-CELL;  Surgeon: Wallace Going, DO;  Location: [place];  Service: Plastics;  Laterality: N/A;   APPLICATION OF A-CELL OF HEAD/NECK N/A  06/28/2016   Procedure: APPLICATION OF A-CELL OF HEAD/NECK;  Surgeon: Wallace Going, DO;  Location: [place];  Service: Plastics;  Laterality: N/A;   APPLICATION OF A-CELL OF HEAD/NECK N/A 07/26/2016   Procedure: APPLICATION OF ACELL OF HEAD;  Surgeon: Wallace Going, DO;  Location: [place];  Service: Plastics;  Laterality: N/A;   APPLICATION OF A-CELL OF HEAD/NECK N/A 08/31/2016   Procedure: APPLICATION OF A-CELL OF HEAD/NECK;  Surgeon: Wallace Going, DO;  Location: [place];  Service: Plastics;  Laterality: N/A;   CATARACT EXTRACTION, BILATERAL     CHOLECYSTECTOMY     ENDARTERECTOMY  06/20/11   ENDARTERECTOMY  06/20/2011   Procedure: ENDARTERECTOMY CAROTID;  Surgeon: Elam Dutch, MD;  Location[facility] OR;  Service: Vascular;  Laterality: Right;  Right Carotid endarterectomy with dacron patch angioplasty    INGUINAL HERNIA REPAIR Right 08/16/2018   Procedure: RIGHT INGUINAL HERNIA REPAIR WITH MESH;  Surgeon: Georganna Skeans, MD;  Location: [place];  Service: General;  Laterality: Right;   MASS EXCISION N/A 06/28/2016   Procedure: EXCISION SCALP MELANOMA;  Surgeon: Wallace Going, DO;  Location: [place];  Service: Plastics;  Laterality: N/A;   MASS EXCISION N/A 07/13/2016   Procedure: RE- EXCISION OF SCALP MELANOMA;  Surgeon: Wallace Going, DO;  Location: [place];  Service: Plastics;  Laterality: N/A;   MASS EXCISION N/A 07/26/2016   Procedure: RE-EXCISION OF SCALP MELANOMA FOR POSITIVE MARGAIN;  Surgeon: Wallace Going, DO;  Location: [place];  Service: Plastics;  Laterality: N/A;   SKIN SPLIT GRAFT Left 08/31/2016  Procedure: SKIN GRAFT SPLIT THICKNESS TO HIS SCALP FROM HIS THIGH;  Surgeon: Wallace Going, DO;  Location: [place];  Service: Plastics;  Laterality: Left;   TONSILLECTOMY     TONSILLECTOMY       Allergies: Patient has no known allergies.  Medications: Prior to Admission medications   Medication Sig Start Date End Date Taking? Authorizing Provider  ALPRAZolam (XANAX) 0.5 MG tablet TAKE 1 TABLET BY MOUTH TWICE DAILY AS NEEDED FOR SLEEP Patient taking differently: Take 0.5 mg by mouth See admin instructions. Take 0.5 mg by mouth at bedtime and an additional 0.5 mg once a day as needed for anxiety 02/04/19  Yes Isaac Bliss, Rayford Halsted, MD  aspirin 81 MG tablet Take 81 mg by mouth daily.   Yes [provider]  cetirizine (ZYRTEC) 10 MG tablet Take 1 tablet (10 mg total) by mouth daily. Patient taking differently: Take 10 mg by mouth at bedtime.  06/19/18  Yes Isaac Bliss, Rayford Halsted, MD  Cyanocobalamin (VITAMIN B-12 PO) Take 1,000 mg by mouth daily.   Yes [provider]  ibuprofen (ADVIL) 200 MG tablet Take 200 mg by mouth every 6 (six) hours as needed for headache or mild pain.   Yes [provider]  indomethacin (INDOCIN) 25 MG capsule TAKE 1 CAPSULE(25 MG) BY MOUTH THREE TIMES DAILY AS NEEDED Patient taking differently: Take 25 mg by mouth See admin instructions. Take 25 mg by mouth three times a week as needed for gout flares 02/20/19  Yes Isaac Bliss, Rayford Halsted, MD  lisinopril (PRINIVIL,ZESTRIL) 40 MG tablet Take 1 tablet (40 mg total) by mouth daily. 09/19/18  Yes Isaac Bliss, Rayford Halsted, MD  Multiple Vitamins-Minerals (CENTRUM SILVER PO) Take 1 tablet by mouth daily.    Yes [provider]  propranolol (INDERAL) 20 MG tablet Take 40 mg by mouth 2 (two) times daily.   Yes [provider]  simvastatin (ZOCOR) 40 MG tablet Take 1 tablet (40 mg total) by mouth every evening. Patient taking differently: Take 40 mg by mouth at bedtime.  09/17/18  Yes Isaac Bliss, Rayford Halsted, MD  fluorouracil (EFUDEX) 5 % cream Apply 1 application topically.  03/12/19   [provider]  propranolol (INDERAL) 40 MG tablet Take 1 tablet  (40 mg total) by mouth 2 (two) times daily. TAKE 1 TABLET BY MOUTH TWICE DAILY( REPLACES NADOLOL) Patient not taking: Reported on 03/25/2019 02/19/19   Isaac Bliss, Rayford Halsted, MD  sildenafil (REVATIO) 20 MG tablet TAKE 2 TABLETS BY MOUTH DAILY AS DIRECTED Patient not taking: Reported on 03/25/2019 06/19/18   Isaac Bliss, Rayford Halsted, MD     Family History  Problem Relation Age of Onset   Heart disease Mother     Social History   Socioeconomic History   Marital status: Married    Spouse name: Not on file   Number of children: Not on file   Years of education: Not on file   Highest education level: Not on file  Occupational History   Not on file  Social Needs   Financial resource strain: Not on file   Food insecurity    Worry: Not on file    Inability: Not on file   Transportation needs    Medical: Not on file    Non-medical: Not on file  Tobacco Use   Smoking status: Never Smoker   Smokeless tobacco: Never Used  Substance and Sexual Activity   Alcohol use: No   Drug use:  No    Types: Other-see comments   Sexual activity: Yes  Lifestyle   Physical activity    Days per week: Not on file    Minutes per session: Not on file   Stress: Not on file  Relationships   Social connections    Talks on phone: Not on file    Gets together: Not on file    Attends religious service: Not on file    Active member of club or organization: Not on file    Attends meetings of clubs or organizations: Not on file    Relationship status: Not on file  Other Topics Concern   Not on file  Social History Narrative   Not on file     Review of Systems: A 12 point ROS discussed and pertinent positives are indicated in the HPI above.  All other systems are negative.  Review of Systems  Unable to perform ROS: Mental status change    Vital Signs: BP 132/74 (BP Location: Right Arm)    Pulse 77    Temp 98.2 F (36.8 C) (Oral)    Resp 20    SpO2 96%   Physical  Exam Vitals signs and nursing note reviewed.  Constitutional:      Appearance: Normal appearance.  HENT:     Mouth/Throat:     Mouth: Mucous membranes are moist.     Pharynx: Oropharynx is clear.  Neck:     Musculoskeletal: Normal range of motion and neck supple.  Cardiovascular:     Rate and Rhythm: Normal rate and regular rhythm.     Heart sounds: No murmur. No friction rub. No gallop.   Pulmonary:     Effort: Pulmonary effort is normal. No respiratory distress.     Breath sounds: Normal breath sounds.  Lymphadenopathy:     Cervical: No cervical adenopathy (none palpable).  Skin:    General: Skin is warm and dry.  Neurological:     General: No focal deficit present.     Mental Status: He is alert and oriented to person, place, and time.  Psychiatric:        Mood and Affect: Mood normal.        Behavior: Behavior normal.        Thought Content: Thought content normal.        Judgment: Judgment normal.      MD Evaluation Airway: WNL Heart: WNL Abdomen: WNL Chest/ Lungs: WNL ASA  Classification: 3 Mallampati/Airway Score: One   Imaging: Ct Head Wo Contrast  Result Date: 03/27/2019 CLINICAL DATA:  57-year-old male with follow-up intracranial hemorrhagic metastasis. EXAM: CT HEAD WITHOUT CONTRAST TECHNIQUE: Contiguous axial images were obtained from the base of the skull through the vertex without intravenous contrast. COMPARISON:  Head CT dated 03/25/2019 FINDINGS: Brain: No significant interval change in the size of the right occipital hemorrhagic mass measuring approximately 5 x 3 cm in axial dimensions. Additional smaller hemorrhagic lesions involving the left occipital lobe, and left temporal lobe appear unchanged. There is associated edema with mild mass effect 5 on the surrounding tissue. No new hemorrhage identified. No midline shift. There is mild age-related atrophy and chronic microvascular ischemic changes. Vascular: No hyperdense vessel or unexpected calcification.  Skull: Normal. Negative for fracture or focal lesion. Sinuses/Orbits: Mild mucoperiosteal thickening paranasal sinuses. No air-fluid level. The mastoid air cells are clear. Other: None IMPRESSION: No significant interval change in the size of the known hemorrhagic metastases or associated edema. No new hemorrhage or midline  shift. Continued follow-up recommended. Electronically Signed   By: Anner Crete M.D.   On: 03/27/2019 13:30   Ct Head Wo Contrast  Result Date: 03/25/2019 CLINICAL DATA:  Ct head wo, Pt's wife reports that Sunday morning he woke her up and was extremely disoriented and confused. Pt reported to wife he had a bad headache and temperature. EXAM: CT HEAD WITHOUT CONTRAST TECHNIQUE: Contiguous axial images were obtained from the base of the skull through the vertex without intravenous contrast. COMPARISON:  None. FINDINGS: Brain: There are 3 areas of acute masslike hemorrhage in the bilateral occipital and left temporal lobes. The largest area in the right occipital lobe measures 5.0 x 3.2 cm. A small area in the posterior left occipital lobe measures 1.6 by 1.8 cm. And another area in the left temporal lobe measures 1.4 by 1.1 cm. There is mass effect on the occipital hormone of the right lateral ventricle secondary to edema surrounding the hemorrhage in the posterior right occipital lobe. No midline shift. Mild enlargement of the ventricles is likely secondary to ex vacuo dilation from brain atrophy. Vascular: No hyperdense vessel or unexpected calcification. Skull: Normal. Negative for fracture or focal lesion. Sinuses/Orbits: Mucosal thickening in the bilateral maxillary and ethmoid sinuses. Other: None. IMPRESSION: Multiple areas of acute hemorrhage in the bilateral occipital and left temporal lobes. The largest area in the right posterior occipital lobe measures 5 x 3 cm with some mass effect on the right lateral ventricle. Differential considerations include hemorrhagic metastasis and  emboli. Consider further evaluation with brain MRI. These results were called by telephone at the time of interpretation on 03/25/2019 at 6:55 pm to Dr. Janeece Fitting , who verbally acknowledged these results. Electronically Signed   By: Audie Pinto M.D.   On: 03/25/2019 19:05   Ct Chest W Contrast  Result Date: 03/26/2019 CLINICAL DATA:  Liver/biliary neoplasm. EXAM: CT CHEST, ABDOMEN, AND PELVIS WITH CONTRAST TECHNIQUE: Multidetector CT imaging of the chest, abdomen and pelvis was performed following the standard protocol during bolus administration of intravenous contrast. CONTRAST:  123mL ISOVUE-300 IOPAMIDOL (ISOVUE-300) INJECTION 61% COMPARISON:  Abdominal ultrasound dated 03/25/2019 FINDINGS: CT CHEST FINDINGS Cardiovascular: Normal heart size. No pericardial effusion. Calcific atherosclerotic disease of the coronary arteries and to mild degree the aorta. Mediastinum/Nodes: Marked bilateral mediastinal lymphadenopathy with abnormal lymph nodes in left paratracheal, left prevascular, left precarinal, central subcarinal stations, as well as right hilar and peribronchial stations. Additional abnormal lymph node in left supra clavicular location. The largest conglomerate of abnormal appearing lymph nodes in the subcarinal region measures 2.9 x 5.4 cm. The trachea is patent. Lungs/Pleura: There is a large subpleural/peri diaphragmatic soft tissue mass in the right lower lobe measuring 3.4 by 4.1 by 3.5 cm. Tiny 2-3 mm perifissural soft tissue nodule is seen in the right middle lobe, image 103/142, sequence 4. A second soft tissue nodule in the more inferior right middle lobe measures 3 mm, image 109/142, sequence 4. There are areas of linear atelectasis in the bilateral lung bases and lingula. Musculoskeletal: No chest wall mass or suspicious bone lesions identified. CT ABDOMEN PELVIS FINDINGS Hepatobiliary: No focal liver abnormality is seen. Status post cholecystectomy. No biliary dilatation. Pancreas:  Unremarkable. No pancreatic ductal dilatation or surrounding inflammatory changes. Spleen: Normal in size without focal abnormality. Adrenals/Urinary Tract: 1.4 cm indeterminate right adrenal mass. The left adrenal gland is normal. The kidneys are normal. No left renal masses appreciated. No hydronephrosis or nephrolithiasis. There is a right peritrigonal bladder diverticulum. Stomach/Bowel: Stomach is  within normal limits. Appendix appears normal. No evidence of bowel wall thickening, distention, or inflammatory changes. Left colonic diverticulosis without evidence of diverticulitis. Vascular/Lymphatic: Aortic atherosclerosis. No enlarged abdominal or pelvic lymph nodes. Reproductive: Enlarged prostate gland. Other: Bilateral fat containing inguinal hernias. Musculoskeletal: No acute osseous findings. Spondylosis of the spine. IMPRESSION: 1. 3.4 cm right lower lobe subpleural/peri diaphragmatic soft tissue pulmonary mass, highly suspicious for primary pulmonary malignancy or metastatic lesion. 2. Marked bilateral mediastinal and right hilar/peribronchial lymphadenopathy. 3. The mid esophagus is compressed or contiguous with the left paratracheal lymphadenopathy/mass. Esophageal involvement cannot be excluded. 4. 1.4 cm indeterminate right adrenal mass. 5. Left colonic diverticulosis without evidence of diverticulitis. 6. Bilateral fat containing inguinal hernias. 7. Enlarged prostate gland. Please correlate to serum PSA values. Aortic Atherosclerosis (ICD10-I70.0). These results will be called to the ordering clinician or representative by the Radiologist Assistant, and communication documented in the PACS or zVision Dashboard. Electronically Signed   By: Fidela Salisbury M.D.   On: 03/26/2019 14:00   Mr Brain W And Wo Contrast  Result Date: 03/26/2019 CLINICAL DATA:  Follow-up examination for acute intracranial hemorrhage. Altered mental status. EXAM: MRI HEAD WITHOUT AND WITH CONTRAST TECHNIQUE:  Multiplanar, multiecho pulse sequences of the brain and surrounding structures were obtained without and with intravenous contrast. CONTRAST:  10 cc of Gadavist. COMPARISON:  Prior CT from 03/25/2019. FINDINGS: Brain: Mild age-related cerebral atrophy with chronic microvascular ischemic disease. Few scattered superimposed remote lacunar infarcts noted within the right basal ganglia and left thalamus. Previously identified intraparenchymal hematoma centered at the right parieto-occipital region again seen, measuring approximately 4.1 x 4.3 x 3.9 cm (estimated volume 34 cc). Underlying metastatic lesion is presumably present. Surrounding vasogenic edema throughout the right parieto-occipital region with mild mass effect on the adjacent atrium of the right lateral ventricle. No definite intraventricular extension of hemorrhage. Associated trace right-to-left midline shift of the septum pellucidum. Basilar cisterns remain patent. Multiple additional scattered enhancing masses seen involving the bilateral cerebral hemispheres, all of which demonstrate associated susceptibility artifact, consistent with hemorrhagic metastases. These are seen as follows; punctate enhancing lesion at the posterior left centrum semi ovale (series 18, image 45), 8 mm enhancing lesion at the right parietal cortex (series 18, image 43), 5 mm lesion involving the cortical gray matter of the anterior left frontal lobe (series 18, image 42), 7 mm lesion along the cortex of the anterior right frontal lobe (series 18, image 34), 11 mm dural-based lesion at the right occipital lobe (series 18, image 28), adjacent punctate 4 mm lesion (series 18, image 25), 14 mm lesion at the anterior left temporal lobe (series 18, image 25), 20 x 19 mm dural-based lesion at the left occipital lobe (series 18, image 23). Additional possible punctate lesion at the anterior temporal left temporal pole (series 18, image 18), not entirely certain given small size.  Relative sparing of the brainstem and cerebellum. No evidence for acute infarct. No extra-axial fluid collection. No hydrocephalus or ventricular trapping. No other acute intracranial abnormality. Vascular: Major intracranial vascular flow voids are maintained. Skull and upper cervical spine: Craniocervical junction within normal limits. Degenerative spondylolysis noted at C2-3 without significant stenosis. No focal marrow replacing lesion. Postsurgical changes noted at the left frontal scalp. Sinuses/Orbits: Patient status post bilateral ocular lens replacement. Globes and orbital soft tissues demonstrate no acute finding. Scattered mucosal thickening noted within the ethmoidal air cells and maxillary sinuses with superimposed small maxillary sinus retention cyst. No mastoid effusion. Inner ear structures grossly normal. Other:  None. IMPRESSION: 1. Multiple scattered intracranial hemorrhagic metastases as detailed above. One of these lesions at the parieto-occipital region has bled with associated 34 cc intraparenchymal hematoma. Associated regional mass effect with trace right-to-left midline shift. 2. Underlying age-related cerebral atrophy with mild chronic small vessel ischemic disease. Electronically Signed   By: Jeannine Boga M.D.   On: 03/26/2019 03:13   US Abdomen Complete  Result Date: 03/25/2019 CLINICAL DATA:  Abnormal LFTs. EXAM: ABDOMEN ULTRASOUND COMPLETE COMPARISON:  None. FINDINGS: Gallbladder: The gallbladder surgically absent. Common bile duct: Diameter: 3 mm Liver: The liver echogenicity appears increased. The hepatic echotexture is coarsened and heterogeneous. Portal vein is patent on color Doppler imaging with normal direction of blood flow towards the liver. IVC: No abnormality visualized. Pancreas: The pancreas is poorly evaluated secondary to overlying bowel gas. Spleen: Size and appearance within normal limits. Right Kidney: Length: 9.3 cm. Echogenicity within normal limits. No  mass or hydronephrosis visualized. Left Kidney: Length: 11 cm. There is a hypoechoic 3.3 x 2.9 by 3 cm mass in the interpolar region of the left kidney. This mass appears to demonstrate some internal color Doppler flow. There is no hydronephrosis. Abdominal aorta: No aneurysm visualized. Other findings: None. IMPRESSION: 1. Limited study secondary to overlying bowel gas and patient body habitus. 2. Status post cholecystectomy. 3. Coarsened heterogeneous appearance of the liver is nonspecific and may be secondary to underlying hepatocellular disease or hepatic steatosis. 4. Indeterminate 3 cm mass involving the interpolar region of the left kidney. Follow-up with a nonemergent outpatient contrast enhanced CT or MRI is recommended for further evaluation of this finding. 5. Pancreas not well evaluated secondary to overlying bowel gas. Electronically Signed   By: Constance Holster M.D.   On: 03/25/2019 23:10   Ct Abdomen Pelvis W Contrast  Result Date: 03/26/2019 CLINICAL DATA:  Liver/biliary neoplasm. EXAM: CT CHEST, ABDOMEN, AND PELVIS WITH CONTRAST TECHNIQUE: Multidetector CT imaging of the chest, abdomen and pelvis was performed following the standard protocol during bolus administration of intravenous contrast. CONTRAST:  130mL ISOVUE-300 IOPAMIDOL (ISOVUE-300) INJECTION 61% COMPARISON:  Abdominal ultrasound dated 03/25/2019 FINDINGS: CT CHEST FINDINGS Cardiovascular: Normal heart size. No pericardial effusion. Calcific atherosclerotic disease of the coronary arteries and to mild degree the aorta. Mediastinum/Nodes: Marked bilateral mediastinal lymphadenopathy with abnormal lymph nodes in left paratracheal, left prevascular, left precarinal, central subcarinal stations, as well as right hilar and peribronchial stations. Additional abnormal lymph node in left supra clavicular location. The largest conglomerate of abnormal appearing lymph nodes in the subcarinal region measures 2.9 x 5.4 cm. The trachea is  patent. Lungs/Pleura: There is a large subpleural/peri diaphragmatic soft tissue mass in the right lower lobe measuring 3.4 by 4.1 by 3.5 cm. Tiny 2-3 mm perifissural soft tissue nodule is seen in the right middle lobe, image 103/142, sequence 4. A second soft tissue nodule in the more inferior right middle lobe measures 3 mm, image 109/142, sequence 4. There are areas of linear atelectasis in the bilateral lung bases and lingula. Musculoskeletal: No chest wall mass or suspicious bone lesions identified. CT ABDOMEN PELVIS FINDINGS Hepatobiliary: No focal liver abnormality is seen. Status post cholecystectomy. No biliary dilatation. Pancreas: Unremarkable. No pancreatic ductal dilatation or surrounding inflammatory changes. Spleen: Normal in size without focal abnormality. Adrenals/Urinary Tract: 1.4 cm indeterminate right adrenal mass. The left adrenal gland is normal. The kidneys are normal. No left renal masses appreciated. No hydronephrosis or nephrolithiasis. There is a right peritrigonal bladder diverticulum. Stomach/Bowel: Stomach is within normal limits. Appendix appears  normal. No evidence of bowel wall thickening, distention, or inflammatory changes. Left colonic diverticulosis without evidence of diverticulitis. Vascular/Lymphatic: Aortic atherosclerosis. No enlarged abdominal or pelvic lymph nodes. Reproductive: Enlarged prostate gland. Other: Bilateral fat containing inguinal hernias. Musculoskeletal: No acute osseous findings. Spondylosis of the spine. IMPRESSION: 1. 3.4 cm right lower lobe subpleural/peri diaphragmatic soft tissue pulmonary mass, highly suspicious for primary pulmonary malignancy or metastatic lesion. 2. Marked bilateral mediastinal and right hilar/peribronchial lymphadenopathy. 3. The mid esophagus is compressed or contiguous with the left paratracheal lymphadenopathy/mass. Esophageal involvement cannot be excluded. 4. 1.4 cm indeterminate right adrenal mass. 5. Left colonic  diverticulosis without evidence of diverticulitis. 6. Bilateral fat containing inguinal hernias. 7. Enlarged prostate gland. Please correlate to serum PSA values. Aortic Atherosclerosis (ICD10-I70.0). These results will be called to the ordering clinician or representative by the Radiologist Assistant, and communication documented in the PACS or zVision Dashboard. Electronically Signed   By: Fidela Salisbury M.D.   On: 03/26/2019 14:00   Dg Chest Portable 1 View  Result Date: 03/25/2019 CLINICAL DATA:  Chest pain and shortness of breath. EXAM: PORTABLE CHEST 1 VIEW COMPARISON:  Chest x-ray dated June 13, 2011. FINDINGS: The heart size and mediastinal contours are within normal limits. Normal pulmonary vascularity. No focal consolidation, pleural effusion, or pneumothorax. Unchanged mild elevation of the left hemidiaphragm. No acute osseous abnormality. IMPRESSION: No active disease. Electronically Signed   By: Titus Dubin M.D.   On: 03/25/2019 16:43    Labs:  CBC: Recent Labs    03/25/19 1354 03/27/19 0825  WBC 7.0 8.4  HGB 13.4 14.5  HCT 39.6 40.8  PLT 201 219    COAGS: Recent Labs    03/25/19 2110  INR 1.1  APTT 30    BMP: Recent Labs    03/25/19 1354 03/27/19 0825  NA 141 139  K 4.3 4.1  CL 106 103  CO2 26 22  GLUCOSE 183* 170*  BUN 15 25*  CALCIUM 9.7 9.7  CREATININE 1.26* 1.18  GFRNONAA 54* 58*  GFRAA >60 >60    LIVER FUNCTION TESTS: Recent Labs    03/25/19 1354  BILITOT 0.6  AST 48*  ALT 24  ALKPHOS 41  PROT 6.8  ALBUMIN 3.8    TUMOR MARKERS: No results for input(s): AFPTM, CEA, CA199, CHROMGRNA in the last 8760 hours.  Assessment and Plan: Intracranial masses History of melanoma.   CT Chest/Abdomen/Pelvis shows lung mass as well as lymphadenopathy.   IR consulted for tissue biopsy.  Dr. Pascal Lux has reviewed and approves patient for cervical lymph node biopsy. Discussed with Dr. Tamala Julian of [facility]. Wife available for consent.   Risks and  benefits of biopsy was discussed with the patient and/or patient's family including, but not limited to bleeding, infection, damage to adjacent structures or low yield requiring additional tests.  All of the questions were answered and there is agreement to proceed.  Consent signed and in chart.  Thank you for this interesting consult.  I greatly enjoyed meeting Randall Mcgee and look forward to participating in their care.  A copy of this report was sent to the requesting provider on this date.  Electronically Signed: Docia Barrier, PA 03/27/2019, 1:57 PM   I spent a total of 40 Minutes    in face to face in clinical consultation, greater than 50% of which was counseling/coordinating care for lymphadenopathy.

## 2019-03-27 NOTE — Progress Notes (Addendum)
Up and walk in hallway, doesn't want to go to room, can get combative.  Confused. Security call. Doesn't want to state his name. When asked where are you are he states "[place]." Ramble on about we're following him. NP notified with ativan.

## 2019-03-27 NOTE — Procedures (Signed)
Pre Procedure Dx: Concern for lung cancer, now with left supraclavicular lymphadenopathy Post Procedural Dx: Same  Technically successful US guided biopsy of  left supraclavicular lymph node.  EBL: Trace No immediate complications.   Ronny Bacon, MD Pager #: [phone]

## 2019-03-28 MED ORDER — CALCIUM CARBONATE ANTACID 500 MG PO CHEW
1.0000 | CHEWABLE_TABLET | Freq: Once | ORAL | Status: AC
  Administered 2019-03-28: 200 mg via ORAL
  Filled 2019-03-28: qty 1

## 2019-03-28 NOTE — Care Management Important Message (Signed)
Important Message  Patient Details  Name: Randall Mcgee MRN: [id number] Date of Birth: [birth year]   Medicare Important Message Given:  Yes     Orbie Pyo 03/28/2019, 3:27 PM

## 2019-03-28 NOTE — Evaluation (Signed)
Physical Therapy Evaluation Patient Details Name: Randall Mcgee MRN: [id number] DOB: [birth year] Today's Date: 03/28/2019   History of Present Illness  Randall Mcgee  is a 78 y.o. male, w hypertension, hyperlipidemia, anxiety, remote history of melanoma on scalp 2017, admitted with gait instability since Sunday am prior to admission.  Also with a severe headache and memory issues.  Pt/wife report pt was in his usual state of health and Independence prior to Sunday.  Imaging shows multiple scattered intracranial hemorrhagic metastases thought to originate in the lungs.  Clinical Impression  Pt admitted with/for gait instability, HA.  Imaging showing multiple hemorrhagic brain metastases.  Pt needing min assist to occ min guard for general mobility with balance issues in stance and gait.Marland Kitchen  Pt currently limited functionally due to the problems listed. ( See problems list.)   Pt will benefit from PT to maximize function and safety in order to get ready for next venue listed below.     Follow Up Recommendations SNF;Supervision/Assistance - 24 hour;Other (comment)(pt's wife unable to care for patient)    Equipment Recommendations  None recommended by PT;Other (comment)(TBA next venue)    Recommendations for Other Services       Precautions / Restrictions Precautions Precautions: Fall      Mobility  Bed Mobility               General bed mobility comments: NT  Transfers Overall transfer level: Needs assistance   Transfers: Sit to/from Stand Sit to Stand: Min assist         General transfer comment: Stood with use of UE's, needed assist for balance once in upright stance.  Ambulation/Gait Ambulation/Gait assistance: Min assist(to occasional min guard.) Gait Distance (Feet): 150 Feet(x2 with standing rest to regroup.) Assistive device: None Gait Pattern/deviations: Step-through pattern Gait velocity: slower.  Could increase speed though not significantly Gait velocity  interpretation: 1.31 - 2.62 ft/sec, indicative of limited community ambulator General Gait Details: pt with episodes of mild instability, listing posteriorly and to the right.  Scanning produced need for slow down.  Pt occasionally lost his direction when scanning.  Abrupt turns, backing up and scanning produced deviations.  Cannot be sure of a visual deficit given how pt interacted with his environment.  Stairs            Wheelchair Mobility    Modified Rankin (Stroke Patients Only)       Balance Overall balance assessment: Needs assistance Sitting-balance support: No upper extremity supported;Single extremity supported Sitting balance-Leahy Scale: Good Sitting balance - Comments: could accept challenge to balance.  Fair righting reactions.     Standing balance-Leahy Scale: Poor(poor initially with improvement ) Standing balance comment: generally tendency to list posteriorly                             Pertinent Vitals/Pain Pain Assessment: No/denies pain Pain Intervention(s): Monitored during session    [place] expects to be discharged to:: Private residence Living Arrangements: Spouse/significant other Available Help at Discharge: Family;Available 24 hours/day;Available PRN/intermittently;Other (Comment)(but wife, who is there 24/7 cannot assist patient ) Type of Home: House Home Access: Stairs to enter   [employer] of Steps: 1 Home Layout: One level Home Equipment: Grab bars - tub/shower      Prior Function Level of Independence: Independent         Comments: drove, Independent without device, ran all errands     Hand Dominance  Extremity/Trunk Assessment   Upper Extremity Assessment Upper Extremity Assessment: Overall WFL for tasks assessed    Lower Extremity Assessment Lower Extremity Assessment: Overall WFL for tasks assessed(proximal weaknesses, L>R, but functional)       Communication    Communication: No difficulties  Cognition Arousal/Alertness: Awake/alert Behavior During Therapy: WFL for tasks assessed/performed Overall Cognitive Status: Within Functional Limits for tasks assessed(though NT formally)                                        General Comments      Exercises     Assessment/Plan    PT Assessment Patient needs continued PT services  PT Problem List Decreased strength;Decreased balance;Decreased coordination;Decreased mobility;Decreased knowledge of use of DME       PT Treatment Interventions DME instruction;Gait training;Functional mobility training;Therapeutic activities;Balance training;Patient/family education    PT Goals (Current goals can be found in the Care Plan section)  Acute Rehab PT Goals Patient Stated Goal: Get out of here to anywhere but here PT Goal Formulation: With patient Time For Goal Achievement: 04/04/19 Potential to Achieve Goals: Good    Frequency Min 3X/week   Barriers to discharge        Co-evaluation               AM-PAC PT "6 Clicks" Mobility  Outcome Measure Help needed turning from your back to your side while in a flat bed without using bedrails?: A Little Help needed moving from lying on your back to sitting on the side of a flat bed without using bedrails?: A Little Help needed moving to and from a bed to a chair (including a wheelchair)?: A Little Help needed standing up from a chair using your arms (e.g., wheelchair or bedside chair)?: A Little Help needed to walk in hospital room?: A Little Help needed climbing 3-5 steps with a railing? : A Little 6 Click Score: 18    End of Session   Activity Tolerance: Patient tolerated treatment well Patient left: in chair;with call bell/phone within reach;with chair alarm set;with family/visitor present Nurse Communication: Mobility status PT Visit Diagnosis: Unsteadiness on feet (R26.81);Other abnormalities of gait and mobility  (R26.89);Other symptoms and signs involving the nervous system (R29.898)    Time: [id number] PT Time Calculation (min) (ACUTE ONLY): 23 min   Charges:   PT Evaluation $PT Eval Low Complexity: 1 Low PT Treatments $Gait Training: 8-22 mins        03/28/2019  Donnella Sham, PT Acute Rehabilitation Services [phone]  (pager) [phone]  (office)  Tessie Fass Laron Boorman 03/28/2019, 6:19 PM

## 2019-03-28 NOTE — TOC Initial Note (Signed)
Transition of Care [facility]) - Initial/Assessment Note    Patient Details  Name: Randall Mcgee MRN: [id number] Date of Birth: [birth year]  [facility] CM/SW Contact:    Pollie Friar, RN Phone Number: 03/28/2019, 12:42 PM  Clinical Narrative:                 Wife asked to meet with TOC to determine the plan at d/c for her spouse. She currently is having treatments for cancer and physically can not take care of him at home in his current condition. She is asking for some rehab prior to home. She is in agreement with him being faxed out in the [place] area.  CM also went over plans after rehab. She hopes to be able to get him home after rehab. CM encouraged her to look into aides for home. She is not sure she can afford these services. CM also encouraged her to talk with the rehab once he is discharged there and see about long term placement if needed. She voiced understanding.  TOC awaiting therapy evals.   Expected Discharge Plan: Skilled Nursing Facility Barriers to Discharge: Continued Medical Work up   Patient Goals and CMS Choice   CMS Medicare.gov Compare Post Acute Care list provided to:: Patient Represenative (must comment) Choice offered to / list presented to : Spouse  Expected Discharge Plan and Services Expected Discharge Plan: Skilled Nursing Facility In-house Referral: Clinical Social Work Discharge Planning Services: CM Consult Post Acute Care Choice: [place] Living arrangements for the past 2 months: [place]                                      Prior Living Arrangements/Services Living arrangements for the past 2 months: Single Family Home Lives with:: Spouse Patient language and need for interpreter reviewed:: Yes(no needs) Do you feel safe going back to the place where you live?: Yes      Need for Family Participation in Patient Care: Yes (Comment)(pt will required 24 hour supervision) Care giver support system  in place?: No (comment)(wife is not physically able to provide support pt currently needs.)   Criminal Activity/Legal Involvement Pertinent to Current Situation/Hospitalization: No - Comment as needed  Activities of Daily Living      Permission Sought/Granted                  Emotional Assessment Appearance:: Appears stated age Attitude/Demeanor/Rapport: Angry Affect (typically observed): Defensive, Anxious Orientation: : Oriented to Self, Oriented to Place   Psych Involvement: No (comment)  Admission diagnosis:  Dehydration [E86.0] Confusion [R41.0] ARF (acute renal failure) ([place]) [N17.9] Abnormal liver function [R94.5] Right hip pain [M25.551] Acute cerebral hemorrhage ([place]) [I61.9] Patient Active Problem List   Diagnosis Date Noted  . Brain metastases ([place]) 03/26/2019  . ICH (intracerebral hemorrhage) ([place]) 03/25/2019  . Erectile dysfunction 06/19/2018  . Anxiety state 06/19/2018  . Status post split thickness skin graft 09/08/2016  . Melanoma of skin ([place]) 07/13/2016  . Malignant melanoma of scalp ([place]) 05/29/2016  . Elevated TSH 07/27/2015  . Impaired glucose tolerance 07/27/2015  . Carotid artery stenosis 11/07/2011  . Right inguinal hernia 11/07/2011  . Hypertension 10/03/2010  . Hypercholesterolemia 10/03/2010   PCP:  Isaac Bliss, Rayford Halsted, MD Pharmacy:   [facility] [place], [address] AT [address]  [place] [address] Phone: [phone] Fax: [phone]     Social Determinants of Health (SDOH) Interventions    Readmission Risk Interventions No flowsheet data found.

## 2019-03-28 NOTE — Progress Notes (Signed)
PROGRESS NOTE    Randall Mcgee  [id number] DOB: 09/21/1940 DOA: 03/25/2019 PCP: Isaac Bliss, Rayford Halsted, MD    Brief Narrative:  78 year old gentleman with history of hypertension, hyperlipidemia, anxiety and remote history of scalp melanoma status post excision in 2017 came to the emergency room with about 1 to 2 weeks of stumbling and losing balance, not feeling well with low-grade fever since last few days, loss of concentration and memory lack.  Hemodynamically stable in the ER.  CT head showed multiple areas of acute hemorrhage with right posterior occipital lobe mass.  COVID-19 test was negative.  Chest x-ray was normal.  Admitted with metastatic brain cancer and hemorrhagic brain lesion. 03/26/2019: Overnight became paranoid, difficult to control behavior. 03/27/2019: Underwent percutaneous biopsy left supraclavicular gland, results pending.   Assessment & Plan:   Principal Problem:   ICH (intracerebral hemorrhage) (HCC) Active Problems:   Hypertension   Hypercholesterolemia   Impaired glucose tolerance  Intracerebral hemorrhage/metastatic brain cancer with hemorrhage and vasogenic edema: Suspect malignant melanoma versus lung cancer. Neurologically stable now.  Repeat CT head was without worsening bleeding. Avoid all NSAID's and aspirin. Continue dexamethasone 4 mg every 6 hours IV.  Tylenol for pain relief. CT scan of the chest and abdomen showed lesion near the bronchus with pressure on the bronchus, intervention radiology was consulted and recommended pulmonary consult. Underwent lymph node biopsy from the left neck. Followed by radiation oncology and hematology oncology.  Accelerated hypertension: Blood pressures better now.  On his lisinopril and propanolol as well as added dose of amlodipine.   Hyperlipidemia: On statin at home.  Continue.  Abnormal blood sugar: A1c 6.1.  No indication for treatment.  Anticipate elevated blood sugars with steroid treatment, will hold  off on initiating any treatment.  Paranoia/abnormal behavior: Suspect due to hospitalization, IV steroid use, brain lesion.   All-time fall precautions.  Will avoid narcotics.  He does occasionally takes small dose of Xanax at home that he should continue.  Work with PT OT.   DVT prophylaxis: SCDs Code Status: Full code Family Communication: Wife.   Disposition Plan: Pending.  SNF versus home with home health.   Consultants:   Oncology  Neurology  Radiation oncology  Pulmonary  Procedures:   None  Antimicrobials:   None   Subjective:  Seen and examined.  Still paranoia but better than the night before.  No other overnight events.  He was very angry that somebody took his dentures without his permission's.  Objective: Vitals:   03/28/19 0006 03/28/19 0521 03/28/19 0845 03/28/19 1100  BP: 123/64 131/74 (!) 160/79 127/63  Pulse: 64 (!) 59 70 63  Resp: 18 18 18 18   Temp: 97.8 F (36.6 C) (!) 97.4 F (36.3 C) (!) 97.4 F (36.3 C) 98.2 F (36.8 C)  TempSrc: Oral Oral Axillary Axillary  SpO2: 95% 91% 96% 96%    Intake/Output Summary (Last 24 hours) at 03/28/2019 1340 Last data filed at 03/28/2019 0900 Gross per 24 hour  Intake 440 ml  Output 400 ml  Net 40 ml   There were no vitals filed for this visit.  Examination:  General exam: Quiet and calm, still somehow paranoid.   Respiratory system: Clear to auscultation. Respiratory effort normal. Cardiovascular system: S1 & S2 heard, RRR. No JVD, murmurs, rubs, gallops or clicks. No pedal edema. Gastrointestinal system: Abdomen is nondistended, soft and nontender. No organomegaly or masses felt. Normal bowel sounds heard. Central nervous system: Alert but paranoid and confused.  Left homonymous  hemianopia.   Extremities: Symmetric 5 x 5 power. Skin: No rashes, lesions or ulcers Psychiatry: Judgement and insight appear compromised.  Mood & affect anxious and paranoid.    Data Reviewed: I have personally  reviewed following labs and imaging studies  CBC: Recent Labs  Lab 03/25/19 1354 03/27/19 0825  WBC 7.0 8.4  NEUTROABS  --  6.8  HGB 13.4 14.5  HCT 39.6 40.8  MCV 100.0 94.0  PLT 201 [id number]   Basic Metabolic Panel: Recent Labs  Lab 03/25/19 1354 03/27/19 0825  NA 141 139  K 4.3 4.1  CL 106 103  CO2 26 22  GLUCOSE 183* 170*  BUN 15 25*  CREATININE 1.26* 1.18  CALCIUM 9.7 9.7  MG  --  2.0  PHOS  --  3.7   GFR: CrCl cannot be calculated (Unknown ideal weight.). Liver Function Tests: Recent Labs  Lab 03/25/19 1354  AST 48*  ALT 24  ALKPHOS 41  BILITOT 0.6  PROT 6.8  ALBUMIN 3.8   No results for input(s): LIPASE, AMYLASE in the last 168 hours. No results for input(s): AMMONIA in the last 168 hours. Coagulation Profile: Recent Labs  Lab 03/25/19 2110  INR 1.1   Cardiac Enzymes: Recent Labs  Lab 03/25/19 2110  CKTOTAL 164  CKMB 2.7   BNP (last 3 results) No results for input(s): PROBNP in the last 8760 hours. HbA1C: Recent Labs    03/25/19 1354  HGBA1C 6.1*   CBG: No results for input(s): GLUCAP in the last 168 hours. Lipid Profile: No results for input(s): CHOL, HDL, LDLCALC, TRIG, CHOLHDL, LDLDIRECT in the last 72 hours. Thyroid Function Tests: No results for input(s): TSH, T4TOTAL, FREET4, T3FREE, THYROIDAB in the last 72 hours. Anemia Panel: No results for input(s): VITAMINB12, FOLATE, FERRITIN, TIBC, IRON, RETICCTPCT in the last 72 hours. Sepsis Labs: No results for input(s): PROCALCITON, LATICACIDVEN in the last 168 hours.  Recent Results (from the past 240 hour(s))  Novel Coronavirus, NAA ([facility])     Status: None   Collection Time: 03/25/19 12:00 AM   Specimen: Oropharyngeal(OP) collection in vial transport medium   OROPHARYNGEA  TESTING  Result Value Ref Range Status   SARS-CoV-2, NAA Not Detected Not Detected Final    Comment: This nucleic acid amplification test was developed and its perfomance characteristics determined by  [employer]. Nucleic acid amplification tests include PCR and TMA. This test has not been FDA cleared or approved. This test has been authorized by FDA under an Emergency Use Authorization (EUA). This test is only authorized for the duration of time the declaration that circumstances exist justifying the authorization of the emergency use of in vitro diagnostic tests for detection of SARS-CoV-2 virus and/or diagnosis of COVID-19 infection under section 564(b)(1) of the Act, 21 U.S.C. [id number]) (1), unless the authorization is terminated or revoked sooner. When diagnostic testing is negative, the possibility of a false negative result should be considered in the context of a patient's recent exposures and the presence of clinical signs and symptoms consistent with COVID-19. An individual without symptoms of COVID-19 and who is not shedding SARS-CoV-2 virus would  expect to have a negative (not detected) result in this assay.   SARS CORONAVIRUS 2 (TAT 6-24 HRS) Nasopharyngeal Nasopharyngeal Swab     Status: None   Collection Time: 03/25/19  7:41 PM   Specimen: Nasopharyngeal Swab  Result Value Ref Range Status   SARS Coronavirus 2 NEGATIVE NEGATIVE Final    Comment: (NOTE) SARS-CoV-2 target nucleic acids  are NOT DETECTED. The SARS-CoV-2 RNA is generally detectable in upper and lower respiratory specimens during the acute phase of infection. Negative results do not preclude SARS-CoV-2 infection, do not rule out co-infections with other pathogens, and should not be used as the sole basis for treatment or other patient management decisions. Negative results must be combined with clinical observations, patient history, and epidemiological information. The expected result is Negative. Fact Sheet for Patients: SugarRoll.be Fact Sheet for Healthcare Providers: https://www.woods-mathews.com/ This test is not yet approved or cleared by the  [place] FDA and  has been authorized for detection and/or diagnosis of SARS-CoV-2 by FDA under an Emergency Use Authorization (EUA). This EUA will remain  in effect (meaning this test can be used) for the duration of the COVID-19 declaration under Section 56 4(b)(1) of the Act, 21 U.S.C. section 360bbb-3(b)(1), unless the authorization is terminated or revoked sooner. Performed at [facility], [place] [address], [place], [place] [address]   Culture, blood (routine x 2)     Status: None (Preliminary result)   Collection Time: 03/25/19  9:10 PM   Specimen: BLOOD RIGHT HAND  Result Value Ref Range Status   Specimen Description BLOOD RIGHT HAND  Final   Special Requests   Final    BOTTLES DRAWN AEROBIC AND ANAEROBIC Blood Culture results may not be optimal due to an inadequate volume of blood received in culture bottles   Culture   Final    NO GROWTH 3 DAYS Performed at [facility], [place] [address], [place], [place] [address]    Report Status PENDING  Incomplete  Culture, blood (routine x 2)     Status: None (Preliminary result)   Collection Time: 03/26/19  9:13 AM   Specimen: BLOOD LEFT HAND  Result Value Ref Range Status   Specimen Description BLOOD LEFT HAND  Final   Special Requests   Final    BOTTLES DRAWN AEROBIC ONLY Blood Culture adequate volume   Culture   Final    NO GROWTH 2 DAYS Performed at [facility], [place] [address] [place],  [address]    Report Status PENDING  Incomplete         Radiology Studies: Ct Head Wo Contrast  Result Date: 03/27/2019 CLINICAL DATA:  58-year-old male with follow-up intracranial hemorrhagic metastasis. EXAM: CT HEAD WITHOUT CONTRAST TECHNIQUE: Contiguous axial images were obtained from the base of the skull through the vertex without intravenous contrast. COMPARISON:  Head CT dated 03/25/2019 FINDINGS: Brain: No significant interval change in the size of the right occipital hemorrhagic mass measuring  approximately 5 x 3 cm in axial dimensions. Additional smaller hemorrhagic lesions involving the left occipital lobe, and left temporal lobe appear unchanged. There is associated edema with mild mass effect 5 on the surrounding tissue. No new hemorrhage identified. No midline shift. There is mild age-related atrophy and chronic microvascular ischemic changes. Vascular: No hyperdense vessel or unexpected calcification. Skull: Normal. Negative for fracture or focal lesion. Sinuses/Orbits: Mild mucoperiosteal thickening paranasal sinuses. No air-fluid level. The mastoid air cells are clear. Other: None IMPRESSION: No significant interval change in the size of the known hemorrhagic metastases or associated edema. No new hemorrhage or midline shift. Continued follow-up recommended. Electronically Signed   By: Anner Crete M.D.   On: 03/27/2019 13:30   [place] Fna Soft Tissue  Result Date: 03/27/2019 INDICATION: No known primary, now with concern for metastatic lung cancer. Please perform ultrasound-guided biopsy of indeterminate left supraclavicular lymph  node for tissue diagnostic purposes. EXAM: ULTRASOUND-GUIDED LEFT SUPRACLAVICULAR LYMPH NODE BIOPSY COMPARISON:  CT the chest, abdomen and pelvis-03/26/2019 MEDICATIONS: None ANESTHESIA/SEDATION: Moderate (conscious) sedation was employed during this procedure. A total of Versed 1 mg and Fentanyl 25 mcg was administered intravenously. Moderate Sedation Time: 10 minutes. The patient's level of consciousness and vital signs were monitored continuously by radiology nursing throughout the procedure under my direct supervision. COMPLICATIONS: None immediate. TECHNIQUE: Informed written consent was obtained from the patient after a discussion of the risks, benefits and alternatives to treatment. Questions regarding the procedure were encouraged and answered. Initial ultrasound scanning demonstrated an approximately 1.8 x 1.4 cm left supraclavicular lymph node (image 4)  correlating with the lymph node seen on preceding CT scan image 14, series 3). An ultrasound image was saved for documentation purposes. The procedure was planned. A timeout was performed prior to the initiation of the procedure. The operative was prepped and draped in the usual sterile fashion, and a sterile drape was applied covering the operative field. A timeout was performed prior to the initiation of the procedure. Local anesthesia was provided with 1% lidocaine with epinephrine. Under direct ultrasound guidance, an 18 gauge core needle device was utilized to obtain to obtain 3 core needle biopsies of the indeterminate left supraclavicular lymph node. The samples were placed in saline and submitted to pathology. The needle was removed and hemostasis was achieved with manual compression. Post procedure scan was negative for significant hematoma. A dressing was placed. The patient tolerated the procedure well without immediate postprocedural complication. IMPRESSION: Technically successful ultrasound guided biopsy of dominant left supraclavicular lymph node. Electronically Signed   By: Sandi Mariscal M.D.   On: 03/27/2019 16:19        Scheduled Meds:  ALPRAZolam  0.5 mg Oral QHS   amLODipine  5 mg Oral Daily   dexamethasone (DECADRON) injection  4 mg Intravenous Q6H   lisinopril  40 mg Oral Daily   loratadine  10 mg Oral Daily   propranolol  40 mg Oral BID   sodium chloride flush  3 mL Intravenous Once   Continuous Infusions:   LOS: 3 days    Time spent: 25 minutes    Barb Merino, MD [facility] Pager [phone]  If 7PM-7AM, please contact night-coverage [email or web address] Password [email or web address] 03/28/2019, 1:40 PM

## 2019-03-29 MED ORDER — DEXAMETHASONE 4 MG PO TABS
4.0000 mg | ORAL_TABLET | Freq: Four times a day (QID) | ORAL | Status: DC
  Administered 2019-03-29 – 2019-03-30 (×4): 4 mg via ORAL
  Filled 2019-03-29 (×4): qty 1

## 2019-03-29 NOTE — Evaluation (Signed)
Occupational Therapy Evaluation Patient Details Name: Randall Mcgee MRN: [id number] DOB: 1941/01/05 Today's Date: 03/29/2019    History of Present Illness Randall Mcgee  is a 78 y.o. male, w hypertension, hyperlipidemia, anxiety, remote history of melanoma on scalp 2017, admitted with gait instability since Sunday am prior to admission.  Also with a severe headache and memory issues.  Pt/wife report pt was in his usual state of health and Independence prior to Sunday.  Imaging shows multiple scattered intracranial hemorrhagic metastases thought to originate in the lungs.   Clinical Impression   Pt admitted with above dx. Pt currently with functional limitiations due to the deficits in L visual field cut in L peripheral vision causing him to bump into things on L side, weakness, decreased mobility and decreased ability to care for self. Pt requires minA overall for ADL and mobility with RW due to L sided drift. Pt will benefit from skilled OT to increase their independence and safety with adls and balance to allow discharge to SNF. OT following acutely.     Follow Up Recommendations  SNF;Supervision/Assistance - 24 hour    Equipment Recommendations  None recommended by OT    Recommendations for Other Services       Precautions / Restrictions Precautions Precautions: Fall Restrictions Weight Bearing Restrictions: No      Mobility Bed Mobility               General bed mobility comments: in recliner  Transfers Overall transfer level: Needs assistance   Transfers: Sit to/from Stand Sit to Stand: Min assist         General transfer comment: Stood with use of UE's, needed assist for balance once in upright stance.    Balance Overall balance assessment: Needs assistance Sitting-balance support: No upper extremity supported;Single extremity supported Sitting balance-Leahy Scale: Good       Standing balance-Leahy Scale: Poor                              ADL either performed or assessed with clinical judgement   ADL Overall ADL's : Needs assistance/impaired Eating/Feeding: Modified independent   Grooming: Min guard;Wash/dry hands;Brushing hair   Upper Body Bathing: Set up;Sitting   Lower Body Bathing: Minimal assistance;Cueing for safety;Cueing for sequencing;Sitting/lateral leans;Sit to/from stand   Upper Body Dressing : Set up;Sitting   Lower Body Dressing: Minimal assistance;Sitting/lateral leans;Sit to/from stand   Toilet Transfer: Min guard;Ambulation;Regular Toilet   Toileting- Clothing Manipulation and Hygiene: Minimal assistance;Sitting/lateral lean;Sit to/from stand       Functional mobility during ADLs: Minimal assistance;Rolling walker;Cueing for safety General ADL Comments: Pt requires minA overall for ADL; L sided drift with ambulation and visual field cut.     Vision Baseline Vision/History: No visual deficits Patient Visual Report: Peripheral vision impairment Vision Assessment?: Yes;Vision impaired- to be further tested in functional context Eye Alignment: Within Functional Limits Ocular Range of Motion: Restricted on the left Tracking/Visual Pursuits: Left eye does not track laterally     Perception     Praxis      Pertinent Vitals/Pain Pain Assessment: No/denies pain Pain Intervention(s): Monitored during session     Hand Dominance Right   Extremity/Trunk Assessment Upper Extremity Assessment Upper Extremity Assessment: Overall WFL for tasks assessed   Lower Extremity Assessment Lower Extremity Assessment: Overall WFL for tasks assessed   Cervical / Trunk Assessment Cervical / Trunk Assessment: Normal   Communication Communication Communication: No difficulties  Cognition Arousal/Alertness: Awake/alert Behavior During Therapy: WFL for tasks assessed/performed Overall Cognitive Status: Within Functional Limits for tasks assessed                                      General Comments  Spouse is unable to care for pt. Pt with moderate visual impairments on L side.    Exercises     Shoulder Instructions      Home Living Family/patient expects to be discharged to:: Private residence Living Arrangements: Spouse/significant other Available Help at Discharge: Family;Available 24 hours/day;Available PRN/intermittently;Other (Comment) Type of Home: House Home Access: Stairs to enter Entrance Stairs-Number of Steps: 1   Home Layout: One level     Bathroom Shower/Tub: Walk-in shower;Tub/shower unit   [employer]: Standard     Home Equipment: Grab bars - tub/shower          Prior Functioning/Environment Level of Independence: Independent        Comments: drove, Independent without device, ran all errands        OT Problem List: Decreased strength;Decreased activity tolerance;Impaired balance (sitting and/or standing);Decreased coordination;Decreased safety awareness;Impaired vision/perception      OT Treatment/Interventions: Self-care/ADL training;Therapeutic exercise;Neuromuscular education;Energy conservation;Therapeutic activities;Patient/family education;Balance training    OT Goals(Current goals can be found in the care plan section) Acute Rehab OT Goals Patient Stated Goal: to leave here OT Goal Formulation: With patient Time For Goal Achievement: 04/12/19 Potential to Achieve Goals: Good ADL Goals Pt Will Perform Lower Body Dressing: with modified independence;sit to/from stand Pt Will Perform Toileting - Clothing Manipulation and hygiene: with modified independence;sit to/from stand Additional ADL Goal #1: Pt to perform OOB ADL with modified independence and 1-2 safety cues for L sided drift  OT Frequency: Min 2X/week   Barriers to D/C:            Co-evaluation              AM-PAC OT "6 Clicks" Daily Activity     Outcome Measure Help from another person eating meals?: None Help from another person taking  care of personal grooming?: A Little Help from another person toileting, which includes using toliet, bedpan, or urinal?: A Little Help from another person bathing (including washing, rinsing, drying)?: A Little Help from another person to put on and taking off regular upper body clothing?: A Little Help from another person to put on and taking off regular lower body clothing?: A Little 6 Click Score: 19   End of Session Equipment Utilized During Treatment: Gait belt;Rolling walker Nurse Communication: Mobility status  Activity Tolerance: Patient tolerated treatment well Patient left: in chair;with call bell/phone within reach;with chair alarm set  OT Visit Diagnosis: Unsteadiness on feet (R26.81);Muscle weakness (generalized) (M62.81)                Time: [id number] OT Time Calculation (min): 25 min Charges:  OT General Charges $OT Visit: 1 Visit OT Evaluation $OT Eval Moderate Complexity: 1 Mod OT Treatments $Self Care/Home Management : 8-22 mins  Ebony Hail Harold Hedge) Marsa Aris OTR/L Acute Rehabilitation Services Pager: [phone] Office: [phone] 03/29/2019, 1:39 PM

## 2019-03-29 NOTE — Progress Notes (Signed)
PROGRESS NOTE    Randall Mcgee  [id number] DOB: 10-15-1940 DOA: 03/25/2019 PCP: Isaac Bliss, Rayford Halsted, MD    Brief Narrative:  78 year old gentleman with history of hypertension, hyperlipidemia, anxiety and remote history of scalp melanoma status post excision in 2017 came to the emergency room with about 1 to 2 weeks of stumbling and losing balance, not feeling well with low-grade fever since last few days, loss of concentration and memory lack.  Hemodynamically stable in the ER.  CT head showed multiple areas of acute hemorrhage with right posterior occipital lobe mass.  COVID-19 test was negative.  Chest x-ray was normal.  Admitted with metastatic brain cancer and hemorrhagic brain lesion. 03/26/2019: Overnight became paranoid, difficult to control behavior. 03/27/2019: Underwent percutaneous biopsy left supraclavicular gland, results pending.   Assessment & Plan:   Principal Problem:   ICH (intracerebral hemorrhage) (HCC) Active Problems:   Hypertension   Hypercholesterolemia   Impaired glucose tolerance  Intracerebral hemorrhage/metastatic brain cancer with hemorrhage and vasogenic edema: Suspect malignant melanoma versus lung cancer. Neurologically stable now.  Repeat CT head was without worsening bleeding. Avoid all NSAID's and aspirin. Continue dexamethasone 4 mg every 6 hours IV.  Tylenol for pain relief. CT scan of the chest and abdomen showed lesion near the bronchus with pressure on the bronchus, intervention radiology was consulted and recommended pulmonary consult. Underwent lymph node biopsy from the left neck. Followed by radiation oncology and hematology oncology.  Accelerated hypertension: Blood pressures better now.  On his lisinopril and propanolol as well as added dose of amlodipine.  Will discharge on lisinopril, propranolol and amlodipine.  Hyperlipidemia: On statin at home.  Continue.  Abnormal blood sugar: A1c 6.1.  No indication for treatment.   Anticipate elevated blood sugars with steroid treatment, will hold off on initiating any treatment.    Paranoia/abnormal behavior: Suspect due to hospitalization, IV steroid use, brain lesion.   All-time fall precautions.  Will avoid narcotics.  He does occasionally takes small dose of Xanax at home that he should continue. Much improved.   DVT prophylaxis: SCDs Code Status: Full code Family Communication: Wife.   Disposition Plan: Skilled nursing facility.  Pending bed availability.   Consultants:   Oncology  Neurology  Radiation oncology  Pulmonary  Procedures:   None  Antimicrobials:   None   Subjective:  Seen and examined.  He is much more composed and coherent.  Most of paranoia has gone.  Objective: Vitals:   03/28/19 2340 03/29/19 0404 03/29/19 0900 03/29/19 1100  BP: 134/79 125/72 139/90 (!) 172/83  Pulse: 70 (!) 57 62 90  Resp: 18 15 17 18   Temp: 98.1 F (36.7 C) 98 F (36.7 C) 97.8 F (36.6 C) 97.6 F (36.4 C)  TempSrc: Oral Oral Oral Axillary  SpO2: 96% 92% 96% 100%    Intake/Output Summary (Last 24 hours) at 03/29/2019 1143 Last data filed at 03/29/2019 0600 Gross per 24 hour  Intake 250 ml  Output 701 ml  Net -451 ml   There were no vitals filed for this visit.  Examination:  General exam: Quiet and calm, alert oriented x3. Respiratory system: Clear to auscultation. Respiratory effort normal. Cardiovascular system: S1 & S2 heard, RRR. No JVD, murmurs, rubs, gallops or clicks. No pedal edema. Gastrointestinal system: Abdomen is nondistended, soft and nontender. No organomegaly or masses felt. Normal bowel sounds heard. Central nervous system: Alert and oriented. Left homonymous hemianopia.   Extremities: Symmetric 5 x 5 power. Skin: No rashes, lesions or ulcers  Psychiatry: Judgement and insight appear normal.  Mood and affect anxious.    Data Reviewed: I have personally reviewed following labs and imaging studies  CBC: Recent Labs    Lab 04/07/2019 1354 03/27/19 0825  WBC 7.0 8.4  NEUTROABS  --  6.8  HGB 13.4 14.5  HCT 39.6 40.8  MCV 100.0 94.0  PLT 201 [id number]   Basic Metabolic Panel: Recent Labs  Lab April 07, 2019 1354 03/27/19 0825  NA 141 139  K 4.3 4.1  CL 106 103  CO2 26 22  GLUCOSE 183* 170*  BUN 15 25*  CREATININE 1.26* 1.18  CALCIUM 9.7 9.7  MG  --  2.0  PHOS  --  3.7   GFR: CrCl cannot be calculated (Unknown ideal weight.). Liver Function Tests: Recent Labs  Lab 04/07/19 1354  AST 48*  ALT 24  ALKPHOS 41  BILITOT 0.6  PROT 6.8  ALBUMIN 3.8   No results for input(s): LIPASE, AMYLASE in the last 168 hours. No results for input(s): AMMONIA in the last 168 hours. Coagulation Profile: Recent Labs  Lab 04/07/19 2110  INR 1.1   Cardiac Enzymes: Recent Labs  Lab April 07, 2019 2110  CKTOTAL 164  CKMB 2.7   BNP (last 3 results) No results for input(s): PROBNP in the last 8760 hours. HbA1C: No results for input(s): HGBA1C in the last 72 hours. CBG: No results for input(s): GLUCAP in the last 168 hours. Lipid Profile: No results for input(s): CHOL, HDL, LDLCALC, TRIG, CHOLHDL, LDLDIRECT in the last 72 hours. Thyroid Function Tests: No results for input(s): TSH, T4TOTAL, FREET4, T3FREE, THYROIDAB in the last 72 hours. Anemia Panel: No results for input(s): VITAMINB12, FOLATE, FERRITIN, TIBC, IRON, RETICCTPCT in the last 72 hours. Sepsis Labs: No results for input(s): PROCALCITON, LATICACIDVEN in the last 168 hours.  Recent Results (from the past 240 hour(s))  Novel Coronavirus, NAA ([facility])     Status: None   Collection Time: 04/07/19 12:00 AM   Specimen: Oropharyngeal(OP) collection in vial transport medium   OROPHARYNGEA  TESTING  Result Value Ref Range Status   SARS-CoV-2, NAA Not Detected Not Detected Final    Comment: This nucleic acid amplification test was developed and its perfomance characteristics determined by [employer]. Nucleic acid amplification tests include  PCR and TMA. This test has not been FDA cleared or approved. This test has been authorized by FDA under an Emergency Use Authorization (EUA). This test is only authorized for the duration of time the declaration that circumstances exist justifying the authorization of the emergency use of in vitro diagnostic tests for detection of SARS-CoV-2 virus and/or diagnosis of COVID-19 infection under section 564(b)(1) of the Act, 21 U.S.C. [id number]) (1), unless the authorization is terminated or revoked sooner. When diagnostic testing is negative, the possibility of a false negative result should be considered in the context of a patient's recent exposures and the presence of clinical signs and symptoms consistent with COVID-19. An individual without symptoms of COVID-19 and who is not shedding SARS-CoV-2 virus would  expect to have a negative (not detected) result in this assay.   SARS CORONAVIRUS 2 (TAT 6-24 HRS) Nasopharyngeal Nasopharyngeal Swab     Status: None   Collection Time: 2019/04/07  7:41 PM   Specimen: Nasopharyngeal Swab  Result Value Ref Range Status   SARS Coronavirus 2 NEGATIVE NEGATIVE Final    Comment: (NOTE) SARS-CoV-2 target nucleic acids are NOT DETECTED. The SARS-CoV-2 RNA is generally detectable in upper and lower respiratory specimens during the acute  phase of infection. Negative results do not preclude SARS-CoV-2 infection, do not rule out co-infections with other pathogens, and should not be used as the sole basis for treatment or other patient management decisions. Negative results must be combined with clinical observations, patient history, and epidemiological information. The expected result is Negative. Fact Sheet for Patients: SugarRoll.be Fact Sheet for Healthcare Providers: https://www.woods-mathews.com/ This test is not yet approved or cleared by the [place] FDA and  has been authorized for detection and/or  diagnosis of SARS-CoV-2 by FDA under an Emergency Use Authorization (EUA). This EUA will remain  in effect (meaning this test can be used) for the duration of the COVID-19 declaration under Section 56 4(b)(1) of the Act, 21 U.S.C. section 360bbb-3(b)(1), unless the authorization is terminated or revoked sooner. Performed at [facility], [place] [address], [place], [place] [address]   Culture, blood (routine x 2)     Status: None (Preliminary result)   Collection Time: 03/25/19  9:10 PM   Specimen: BLOOD RIGHT HAND  Result Value Ref Range Status   Specimen Description BLOOD RIGHT HAND  Final   Special Requests   Final    BOTTLES DRAWN AEROBIC AND ANAEROBIC Blood Culture results may not be optimal due to an inadequate volume of blood received in culture bottles   Culture   Final    NO GROWTH 3 DAYS Performed at [facility], [place] [address], [place], [place] [address]    Report Status PENDING  Incomplete  Culture, blood (routine x 2)     Status: None (Preliminary result)   Collection Time: 03/26/19  9:13 AM   Specimen: BLOOD LEFT HAND  Result Value Ref Range Status   Specimen Description BLOOD LEFT HAND  Final   Special Requests   Final    BOTTLES DRAWN AEROBIC ONLY Blood Culture adequate volume   Culture   Final    NO GROWTH 2 DAYS Performed at [facility], [place] [address] [place], [place] [address]    Report Status PENDING  Incomplete         Radiology Studies: Ct Head Wo Contrast  Result Date: 03/27/2019 CLINICAL DATA:  62-year-old male with follow-up intracranial hemorrhagic metastasis. EXAM: CT HEAD WITHOUT CONTRAST TECHNIQUE: Contiguous axial images were obtained from the base of the skull through the vertex without intravenous contrast. COMPARISON:  Head CT dated 03/25/2019 FINDINGS: Brain: No significant interval change in the size of the right occipital hemorrhagic mass measuring approximately 5 x 3 cm in axial dimensions. Additional smaller hemorrhagic  lesions involving the left occipital lobe, and left temporal lobe appear unchanged. There is associated edema with mild mass effect 5 on the surrounding tissue. No new hemorrhage identified. No midline shift. There is mild age-related atrophy and chronic microvascular ischemic changes. Vascular: No hyperdense vessel or unexpected calcification. Skull: Normal. Negative for fracture or focal lesion. Sinuses/Orbits: Mild mucoperiosteal thickening paranasal sinuses. No air-fluid level. The mastoid air cells are clear. Other: None IMPRESSION: No significant interval change in the size of the known hemorrhagic metastases or associated edema. No new hemorrhage or midline shift. Continued follow-up recommended. Electronically Signed   By: Anner Crete M.D.   On: 03/27/2019 13:30   [place] Fna Soft Tissue  Result Date: 03/27/2019 INDICATION: No known primary, now with concern for metastatic lung cancer. Please perform ultrasound-guided biopsy of indeterminate left supraclavicular lymph node for tissue diagnostic purposes. EXAM: ULTRASOUND-GUIDED LEFT SUPRACLAVICULAR LYMPH NODE BIOPSY COMPARISON:  CT the chest, abdomen  and pelvis-03/26/2019 MEDICATIONS: None ANESTHESIA/SEDATION: Moderate (conscious) sedation was employed during this procedure. A total of Versed 1 mg and Fentanyl 25 mcg was administered intravenously. Moderate Sedation Time: 10 minutes. The patient's level of consciousness and vital signs were monitored continuously by radiology nursing throughout the procedure under my direct supervision. COMPLICATIONS: None immediate. TECHNIQUE: Informed written consent was obtained from the patient after a discussion of the risks, benefits and alternatives to treatment. Questions regarding the procedure were encouraged and answered. Initial ultrasound scanning demonstrated an approximately 1.8 x 1.4 cm left supraclavicular lymph node (image 4) correlating with the lymph node seen on preceding CT scan image 14, series 3).  An ultrasound image was saved for documentation purposes. The procedure was planned. A timeout was performed prior to the initiation of the procedure. The operative was prepped and draped in the usual sterile fashion, and a sterile drape was applied covering the operative field. A timeout was performed prior to the initiation of the procedure. Local anesthesia was provided with 1% lidocaine with epinephrine. Under direct ultrasound guidance, an 18 gauge core needle device was utilized to obtain to obtain 3 core needle biopsies of the indeterminate left supraclavicular lymph node. The samples were placed in saline and submitted to pathology. The needle was removed and hemostasis was achieved with manual compression. Post procedure scan was negative for significant hematoma. A dressing was placed. The patient tolerated the procedure well without immediate postprocedural complication. IMPRESSION: Technically successful ultrasound guided biopsy of dominant left supraclavicular lymph node. Electronically Signed   By: Sandi Mariscal M.D.   On: 03/27/2019 16:19        Scheduled Meds:  ALPRAZolam  0.5 mg Oral QHS   amLODipine  5 mg Oral Daily   dexamethasone  4 mg Oral Q6H   lisinopril  40 mg Oral Daily   loratadine  10 mg Oral Daily   propranolol  40 mg Oral BID   sodium chloride flush  3 mL Intravenous Once   Continuous Infusions:   LOS: 4 days    Time spent: 25 minutes    Barb Merino, MD [facility] Pager [phone]  If 7PM-7AM, please contact night-coverage [email or web address] Password [email or web address] 03/29/2019, 11:43 AM

## 2019-03-30 LAB — CULTURE, BLOOD (ROUTINE X 2): Culture: NO GROWTH

## 2019-03-30 MED ORDER — DEXAMETHASONE 4 MG PO TABS
4.0000 mg | ORAL_TABLET | Freq: Three times a day (TID) | ORAL | Status: DC
  Administered 2019-03-30 – 2019-04-05 (×19): 4 mg via ORAL
  Filled 2019-03-30 (×19): qty 1

## 2019-03-30 NOTE — Progress Notes (Signed)
PROGRESS NOTE    Randall Mcgee  [id number] DOB: 21-Jul-1941 DOA: 03/25/2019 PCP: Isaac Bliss, Rayford Halsted, MD    Brief Narrative:  78 year old gentleman with history of hypertension, hyperlipidemia, anxiety and remote history of scalp melanoma status post excision in 2017 came to the emergency room with about 1 to 2 weeks of stumbling and losing balance, not feeling well with low-grade fever since last few days, loss of concentration and memory lack.  Hemodynamically stable in the ER.  CT head showed multiple areas of acute hemorrhage with right posterior occipital lobe mass.  COVID-19 test was negative.  Chest x-ray was normal.  Admitted with metastatic brain cancer and hemorrhagic brain lesion. 03/26/2019: Overnight became paranoid, difficult to control behavior.  Improved now.  03/27/2019: Underwent percutaneous biopsy left supraclavicular gland, results pending.   Assessment & Plan:   Principal Problem:   ICH (intracerebral hemorrhage) (HCC) Active Problems:   Hypertension   Hypercholesterolemia   Impaired glucose tolerance  Intracerebral hemorrhage/metastatic brain cancer with hemorrhage and vasogenic edema: Suspect malignant melanoma versus lung cancer. Neurologically stable now.  Repeat CT head was without worsening bleeding. Avoid all NSAID's and aspirin. Treated with dexamethasone IV.  Changing to oral and decrease dose today.  Will treat with prolonged taper.   CT scan of the chest and abdomen showed lesion near the bronchus with pressure on the bronchus, intervention radiology was consulted and recommended pulmonary consult. Underwent lymph node biopsy from the left neck. Followed by radiation oncology and hematology oncology.  Accelerated hypertension: Blood pressures better now.  On his lisinopril and propanolol as well as added dose of amlodipine.  Will discharge on lisinopril, propranolol and amlodipine.  Hyperlipidemia: On statin at home.  Continue.  Abnormal blood  sugar: A1c 6.1.  No indication for treatment.  Anticipate elevated blood sugars with steroid treatment, will hold off on initiating any treatment.    Paranoia/abnormal behavior: Suspect due to hospitalization, IV steroid use, brain lesion.   All-time fall precautions.  Will avoid narcotics.  He does occasionally takes small dose of Xanax at home that he should continue. Mostly improved.  No more paranoid.   DVT prophylaxis: SCDs Code Status: Full code Family Communication: Wife.   Disposition Plan: Skilled nursing facility.  Pending bed availability.  Medically stable.   Consultants:   Oncology  Neurology  Radiation oncology  Pulmonary  Procedures:   None  Antimicrobials:   None   Subjective:  Seen and examined.  No overnight events.  No more paranoid.  Patient does remember being paranoid and now he feels better.   Objective: Vitals:   03/29/19 2031 03/29/19 2350 03/30/19 0420 03/30/19 0836  BP: 133/71 (!) 114/53 127/68 (!) 152/75  Pulse: 65 (!) 51 62 62  Resp: 16 16 16 18   Temp: 98.7 F (37.1 C) 98 F (36.7 C) 98 F (36.7 C) 97.9 F (36.6 C)  TempSrc: Oral Oral Axillary Oral  SpO2: 94% 94% 99% 95%    Intake/Output Summary (Last 24 hours) at 03/30/2019 1136 Last data filed at 03/30/2019 0800 Gross per 24 hour  Intake -  Output 550 ml  Net -550 ml   There were no vitals filed for this visit.  Examination:  General exam: Quiet and calm, alert oriented x3.  He is very well composed. Respiratory system: Clear to auscultation. Respiratory effort normal. Cardiovascular system: S1 & S2 heard, RRR. No JVD, murmurs, rubs, gallops or clicks. No pedal edema. Gastrointestinal system: Abdomen is nondistended, soft and nontender. No organomegaly  or masses felt. Normal bowel sounds heard. Central nervous system: Alert and oriented.  Loss of left outer quadrant visual field. Extremities: Symmetric 5 x 5 power. Skin: No rashes, lesions or ulcers Psychiatry: Judgement  and insight appear normal.  Mood and affect anxious.    Data Reviewed: I have personally reviewed following labs and imaging studies  CBC: Recent Labs  Lab 03/25/19 1354 03/27/19 0825  WBC 7.0 8.4  NEUTROABS  --  6.8  HGB 13.4 14.5  HCT 39.6 40.8  MCV 100.0 94.0  PLT 201 [id number]   Basic Metabolic Panel: Recent Labs  Lab 03/25/19 1354 03/27/19 0825  NA 141 139  K 4.3 4.1  CL 106 103  CO2 26 22  GLUCOSE 183* 170*  BUN 15 25*  CREATININE 1.26* 1.18  CALCIUM 9.7 9.7  MG  --  2.0  PHOS  --  3.7   GFR: CrCl cannot be calculated (Unknown ideal weight.). Liver Function Tests: Recent Labs  Lab 03/25/19 1354  AST 48*  ALT 24  ALKPHOS 41  BILITOT 0.6  PROT 6.8  ALBUMIN 3.8   No results for input(s): LIPASE, AMYLASE in the last 168 hours. No results for input(s): AMMONIA in the last 168 hours. Coagulation Profile: Recent Labs  Lab 03/25/19 2110  INR 1.1   Cardiac Enzymes: Recent Labs  Lab 03/25/19 2110  CKTOTAL 164  CKMB 2.7   BNP (last 3 results) No results for input(s): PROBNP in the last 8760 hours. HbA1C: No results for input(s): HGBA1C in the last 72 hours. CBG: No results for input(s): GLUCAP in the last 168 hours. Lipid Profile: No results for input(s): CHOL, HDL, LDLCALC, TRIG, CHOLHDL, LDLDIRECT in the last 72 hours. Thyroid Function Tests: No results for input(s): TSH, T4TOTAL, FREET4, T3FREE, THYROIDAB in the last 72 hours. Anemia Panel: No results for input(s): VITAMINB12, FOLATE, FERRITIN, TIBC, IRON, RETICCTPCT in the last 72 hours. Sepsis Labs: No results for input(s): PROCALCITON, LATICACIDVEN in the last 168 hours.  Recent Results (from the past 240 hour(s))  Novel Coronavirus, NAA ([facility])     Status: None   Collection Time: 03/25/19 12:00 AM   Specimen: Oropharyngeal(OP) collection in vial transport medium   OROPHARYNGEA  TESTING  Result Value Ref Range Status   SARS-CoV-2, NAA Not Detected Not Detected Final    Comment: This  nucleic acid amplification test was developed and its perfomance characteristics determined by [employer] and [employer]. Nucleic acid amplification tests include PCR and TMA. This test has not been FDA cleared or approved. This test has been authorized by FDA under an Emergency Use Authorization (EUA). This test is only authorized for the duration of time the declaration that circumstances exist justifying the authorization of the emergency use of in vitro diagnostic tests for detection of SARS-CoV-2 virus and/or diagnosis of COVID-19 infection under section 564(b)(1) of the Act, 21 U.S.C. [id number]) (1), unless the authorization is terminated or revoked sooner. When diagnostic testing is negative, the possibility of a false negative result should be considered in the context of a patient's recent exposures and the presence of clinical signs and symptoms consistent with COVID-19. An individual without symptoms of COVID-19 and who is not shedding SARS-CoV-2 virus would  expect to have a negative (not detected) result in this assay.   SARS CORONAVIRUS 2 (TAT 6-24 HRS) Nasopharyngeal Nasopharyngeal Swab     Status: None   Collection Time: 03/25/19  7:41 PM   Specimen: Nasopharyngeal Swab  Result Value Ref Range Status   SARS  Coronavirus 2 NEGATIVE NEGATIVE Final    Comment: (NOTE) SARS-CoV-2 target nucleic acids are NOT DETECTED. The SARS-CoV-2 RNA is generally detectable in upper and lower respiratory specimens during the acute phase of infection. Negative results do not preclude SARS-CoV-2 infection, do not rule out co-infections with other pathogens, and should not be used as the sole basis for treatment or other patient management decisions. Negative results must be combined with clinical observations, patient history, and epidemiological information. The expected result is Negative. Fact Sheet for Patients: SugarRoll.be Fact Sheet for Healthcare  Providers: https://www.woods-mathews.com/ This test is not yet approved or cleared by the [place] FDA and  has been authorized for detection and/or diagnosis of SARS-CoV-2 by FDA under an Emergency Use Authorization (EUA). This EUA will remain  in effect (meaning this test can be used) for the duration of the COVID-19 declaration under Section 56 4(b)(1) of the Act, 21 U.S.C. section 360bbb-3(b)(1), unless the authorization is terminated or revoked sooner. Performed at [facility], [place] [address] [place], [place] [address]   Culture, blood (routine x 2)     Status: None   Collection Time: 03/25/19  9:10 PM   Specimen: BLOOD RIGHT HAND  Result Value Ref Range Status   Specimen Description BLOOD RIGHT HAND  Final   Special Requests   Final    BOTTLES DRAWN AEROBIC AND ANAEROBIC Blood Culture results may not be optimal due to an inadequate volume of blood received in culture bottles   Culture   Final    NO GROWTH 5 DAYS Performed at [facility], [place] [address], [place], [place] [address]    Report Status 03/30/2019 FINAL  Final  Culture, blood (routine x 2)     Status: None (Preliminary result)   Collection Time: 03/26/19  9:13 AM   Specimen: BLOOD LEFT HAND  Result Value Ref Range Status   Specimen Description BLOOD LEFT HAND  Final   Special Requests   Final    BOTTLES DRAWN AEROBIC ONLY Blood Culture adequate volume   Culture   Final    NO GROWTH 4 DAYS Performed at [facility], [place] [address] [place], [place] [address]    Report Status PENDING  Incomplete         Radiology Studies: No results found.      Scheduled Meds: . ALPRAZolam  0.5 mg Oral QHS  . amLODipine  5 mg Oral Daily  . dexamethasone  4 mg Oral Q8H  . lisinopril  40 mg Oral Daily  . loratadine  10 mg Oral Daily  . propranolol  40 mg Oral BID  . sodium chloride flush  3 mL Intravenous Once   Continuous Infusions:   LOS: 5 days    Time spent: 25  minutes    Barb Merino, MD [facility] Pager [phone]  If 7PM-7AM, please contact night-coverage [email or web address] Password [email or web address] 03/30/2019, 11:36 AM

## 2019-03-30 NOTE — NC FL2 (Signed)
[place] LEVEL OF CARE SCREENING TOOL     IDENTIFICATION  Patient Name: Randall Mcgee Birthdate: Nov 13, 1940 Sex: male Admission Date (Current Location): 03/25/2019  [facility] and [place] Number:  Herbalist and Address:  [facility], [place] [address], [place], [place] [address]      Provider Number: [id number]  Attending Physician Name and Address:  Barb Merino, MD  Relative Name and Phone Number:  Har[place]os, Demirjian, [phone] & Keanthony, Rupp, [phone]    Current Level of Care: Hospital Recommended Level of Care: [place] Prior Approval Number:    Date Approved/Denied:   PASRR Number: Under Manual Review  Discharge Plan: SNF    Current Diagnoses: Patient Active Problem List   Diagnosis Date Noted  . Brain metastases ([place]) 03/26/2019  . ICH (intracerebral hemorrhage) ([place]) 03/25/2019  . Erectile dysfunction 06/19/2018  . Anxiety state 06/19/2018  . Status post split thickness skin graft 09/08/2016  . Melanoma of skin ([place]) 07/13/2016  . Malignant melanoma of scalp ([place]) 05/29/2016  . Elevated TSH 07/27/2015  . Impaired glucose tolerance 07/27/2015  . Carotid artery stenosis 11/07/2011  . Right inguinal hernia 11/07/2011  . Hypertension 10/03/2010  . Hypercholesterolemia 10/03/2010    Orientation RESPIRATION BLADDER Height & Weight     Self, Time, Situation, Place  Normal Continent Weight:   Height:     BEHAVIORAL SYMPTOMS/MOOD NEUROLOGICAL BOWEL NUTRITION STATUS      Continent Diet(Regular diet, thin liquids)  AMBULATORY STATUS COMMUNICATION OF NEEDS Skin   Limited Assist Verbally Surgical wounds(Closed neck incision (Dressing in place))                       Personal Care Assistance Level of Assistance  Bathing, Feeding, Dressing, Total care Bathing Assistance: Limited assistance Feeding assistance: Limited assistance Dressing Assistance: Limited assistance Total  Care Assistance: Limited assistance   Functional Limitations Info  Sight, Hearing, Speech Sight Info: Adequate Hearing Info: Adequate Speech Info: Adequate    SPECIAL CARE FACTORS FREQUENCY  PT (By licensed PT), OT (By licensed OT)     PT Frequency: 5x/wk OT Frequency: 5x/wk            Contractures Contractures Info: Not present    Additional Factors Info  Code Status, Psychotropic, Allergies Code Status Info: Full Code Allergies Info: No Known Allergies Psychotropic Info: xanax tablet .05mg  daily at bedtime         Current Medications (03/30/2019):  This is the current hospital active medication list Current Facility-Administered Medications  Medication Dose Route Frequency Provider Last Rate Last Dose  . acetaminophen (TYLENOL) tablet 650 mg  650 mg Oral Q6H PRN Barb Merino, MD   650 mg at 03/27/19 1740  . ALPRAZolam Duanne Moron) tablet 0.5 mg  0.5 mg Oral QHS Barb Merino, MD   0.5 mg at 03/29/19 2110  . ALPRAZolam Duanne Moron) tablet 0.5 mg  0.5 mg Oral Daily PRN Barb Merino, MD   0.5 mg at 03/27/19 0228  . amLODipine (NORVASC) tablet 5 mg  5 mg Oral Daily Barb Merino, MD   5 mg at 03/30/19 0858  . dexamethasone (DECADRON) tablet 4 mg  4 mg Oral Q8H Barb Merino, MD   4 mg at 03/30/19 1214  . lisinopril (ZESTRIL) tablet 40 mg  40 mg Oral Daily Jani Gravel, MD   40 mg at 03/30/19 0858  . loratadine (CLARITIN) tablet 10 mg  10 mg Oral Daily Maudie Mercury,  Jeneen Rinks, MD   10 mg at 03/30/19 0859  . propranolol (INDERAL) tablet 40 mg  40 mg Oral BID Jani Gravel, MD   40 mg at 03/30/19 0858  . sodium chloride flush (NS) 0.9 % injection 3 mL  3 mL Intravenous Once Charlesetta Shanks, MD         Discharge Medications: Please see discharge summary for a list of discharge medications.  Relevant Imaging Results:  Relevant Lab Results:   Additional Information SSN: [id number]  Philippa Chester Azlee [place], LCSWA

## 2019-03-30 NOTE — TOC Progression Note (Signed)
Transition of Care [facility]) - Progression Note    Patient Details  Name: Randall Mcgee MRN: [id number] Date of Birth: 09/28/1940  Transition of Care [facility]) CM/SW [place], [place] Phone Number: 03/30/2019, 4:32 PM  Clinical Narrative:     CSW met with the patient and his wife at bedside. They are interested in CIR as well as SNF. They wanted time to decide on SNF facilities.   CSW paged MD about putting in an order for CIR. CSW will follow up with the patient and his wife for SNF facility choices.   Expected Discharge Plan: [place] Barriers to Discharge: Continued Medical Work up  Expected Discharge Plan and Services Expected Discharge Plan: [place] In-house Referral: Clinical Social Work Discharge Planning Services: CM Consult Post Acute Care Choice: [place] arrangements for the past 2 months: Single Family Home                                       Social Determinants of Health (SDOH) Interventions    Readmission Risk Interventions No flowsheet data found.

## 2019-03-30 NOTE — Progress Notes (Signed)
CSW faxed clinicals to [place] [facility].   CSW will continue to follow and assist with disposition.   Domenic Schwab, MSW, [place] Worker [facility]  [phone]

## 2019-03-31 LAB — CULTURE, BLOOD (ROUTINE X 2)
Culture: NO GROWTH
Special Requests: ADEQUATE

## 2019-03-31 NOTE — Progress Notes (Signed)
PROGRESS NOTE    Randall Mcgee  [id number] DOB: 02-27-1941 DOA: 03/25/2019 PCP: Isaac Bliss, Rayford Halsted, MD    Brief Narrative:  78 year old gentleman with history of hypertension, hyperlipidemia, anxiety and remote history of scalp melanoma status post excision in 2017 came to the emergency room with about 1 to 2 weeks of stumbling and losing balance, not feeling well with low-grade fever since last few days, loss of concentration and memory lack.  Hemodynamically stable in the ER.  CT head showed multiple areas of acute hemorrhage with right posterior occipital lobe mass.  COVID-19 test was negative.  Chest x-ray was normal.  Admitted with metastatic brain cancer and hemorrhagic brain lesion. 03/26/2019: Overnight became paranoid, difficult to control behavior.  Improved now.  03/27/2019: Underwent percutaneous biopsy left supraclavicular gland, results pending.   Assessment & Plan:   Principal Problem:   ICH (intracerebral hemorrhage) (HCC) Active Problems:   Hypertension   Hypercholesterolemia   Impaired glucose tolerance  Intracerebral hemorrhage/metastatic brain cancer with hemorrhage and vasogenic edema: Suspect malignant melanoma versus lung cancer. Neurologically stable now.  Repeat CT head was without worsening bleeding. Avoid all NSAID's and aspirin. Treated with dexamethasone IV.  Changing to oral and decrease dose today.  Will treat with prolonged taper.   CT scan of the chest and abdomen showed lesion near the bronchus with pressure on the bronchus, intervention radiology was consulted and recommended pulmonary consult. Underwent lymph node biopsy from the left neck. Followed by radiation oncology and hematology oncology.  Accelerated hypertension: Blood pressures better now.  On his lisinopril and propanolol as well as added dose of amlodipine.  Will discharge on lisinopril, propranolol and amlodipine.  Hyperlipidemia: On statin at home.  Continue.  Abnormal blood  sugar: A1c 6.1.  No indication for treatment.  Anticipate elevated blood sugars with steroid treatment, will hold off on initiating any treatment.    Paranoia/abnormal behavior: Suspect due to hospitalization, IV steroid use, brain lesion.   All-time fall precautions.  Will avoid narcotics.  He does occasionally takes small dose of Xanax at home that he should continue. Mostly improved.  No more paranoid.   DVT prophylaxis: SCDs Code Status: Full code Family Communication: Wife.   Disposition Plan: Skilled nursing facility.  Pending bed availability.  Medically stable.   Consultants:   Oncology  Neurology  Radiation oncology  Pulmonary  Procedures:   None  Antimicrobials:   None   Subjective: No overnight events.  He is walking a lot, still has trouble on the left side vision and he is trying to overcompensate.  Objective: Vitals:   03/31/19 0444 03/31/19 0700 03/31/19 0959 03/31/19 1100  BP: 131/75 132/70  126/68  Pulse: (!) 49 63 72 (!) 50  Resp: 16 16  17   Temp: 98 F (36.7 C) 98.7 F (37.1 C)  98.1 F (36.7 C)  TempSrc: Oral Oral  Oral  SpO2: 95% 97%  98%    Intake/Output Summary (Last 24 hours) at 03/31/2019 1553 Last data filed at 03/31/2019 0900 Gross per 24 hour  Intake 320 ml  Output -  Net 320 ml   There were no vitals filed for this visit.  Examination:  General exam: Quiet and calm, alert oriented x3.  He is very well composed. Respiratory system: Clear to auscultation. Respiratory effort normal. Cardiovascular system: S1 & S2 heard, RRR. No JVD, murmurs, rubs, gallops or clicks. No pedal edema. Gastrointestinal system: Abdomen is nondistended, soft and nontender. No organomegaly or masses felt. Normal bowel  sounds heard. Central nervous system: Alert and oriented.  Loss of left outer quadrant visual field. Extremities: Symmetric 5 x 5 power. Skin: No rashes, lesions or ulcers Psychiatry: Judgement and insight appear normal.  Mood and affect  anxious.    Data Reviewed: I have personally reviewed following labs and imaging studies  CBC: Recent Labs  Lab 03/25/19 1354 03/27/19 0825  WBC 7.0 8.4  NEUTROABS  --  6.8  HGB 13.4 14.5  HCT 39.6 40.8  MCV 100.0 94.0  PLT 201 [id number]   Basic Metabolic Panel: Recent Labs  Lab 03/25/19 1354 03/27/19 0825  NA 141 139  K 4.3 4.1  CL 106 103  CO2 26 22  GLUCOSE 183* 170*  BUN 15 25*  CREATININE 1.26* 1.18  CALCIUM 9.7 9.7  MG  --  2.0  PHOS  --  3.7   GFR: CrCl cannot be calculated (Unknown ideal weight.). Liver Function Tests: Recent Labs  Lab 03/25/19 1354  AST 48*  ALT 24  ALKPHOS 41  BILITOT 0.6  PROT 6.8  ALBUMIN 3.8   No results for input(s): LIPASE, AMYLASE in the last 168 hours. No results for input(s): AMMONIA in the last 168 hours. Coagulation Profile: Recent Labs  Lab 03/25/19 2110  INR 1.1   Cardiac Enzymes: Recent Labs  Lab 03/25/19 2110  CKTOTAL 164  CKMB 2.7   BNP (last 3 results) No results for input(s): PROBNP in the last 8760 hours. HbA1C: No results for input(s): HGBA1C in the last 72 hours. CBG: No results for input(s): GLUCAP in the last 168 hours. Lipid Profile: No results for input(s): CHOL, HDL, LDLCALC, TRIG, CHOLHDL, LDLDIRECT in the last 72 hours. Thyroid Function Tests: No results for input(s): TSH, T4TOTAL, FREET4, T3FREE, THYROIDAB in the last 72 hours. Anemia Panel: No results for input(s): VITAMINB12, FOLATE, FERRITIN, TIBC, IRON, RETICCTPCT in the last 72 hours. Sepsis Labs: No results for input(s): PROCALCITON, LATICACIDVEN in the last 168 hours.  Recent Results (from the past 240 hour(s))  Novel Coronavirus, NAA ([facility])     Status: None   Collection Time: 03/25/19 12:00 AM   Specimen: Oropharyngeal(OP) collection in vial transport medium   OROPHARYNGEA  TESTING  Result Value Ref Range Status   SARS-CoV-2, NAA Not Detected Not Detected Final    Comment: This nucleic acid amplification test was developed  and its perfomance characteristics determined by [employer]. Nucleic acid amplification tests include PCR and TMA. This test has not been FDA cleared or approved. This test has been authorized by FDA under an Emergency Use Authorization (EUA). This test is only authorized for the duration of time the declaration that circumstances exist justifying the authorization of the emergency use of in vitro diagnostic tests for detection of SARS-CoV-2 virus and/or diagnosis of COVID-19 infection under section 564(b)(1) of the Act, 21 U.S.C. GF:7541899) (1), unless the authorization is terminated or revoked sooner. When diagnostic testing is negative, the possibility of a false negative result should be considered in the context of a patient's recent exposures and the presence of clinical signs and symptoms consistent with COVID-19. An individual without symptoms of COVID-19 and who is not shedding SARS-CoV-2 virus would  expect to have a negative (not detected) result in this assay.   SARS CORONAVIRUS 2 (TAT 6-24 HRS) Nasopharyngeal Nasopharyngeal Swab     Status: None   Collection Time: 03/25/19  7:41 PM   Specimen: Nasopharyngeal Swab  Result Value Ref Range Status   SARS Coronavirus 2 NEGATIVE NEGATIVE Final  Comment: (NOTE) SARS-CoV-2 target nucleic acids are NOT DETECTED. The SARS-CoV-2 RNA is generally detectable in upper and lower respiratory specimens during the acute phase of infection. Negative results do not preclude SARS-CoV-2 infection, do not rule out co-infections with other pathogens, and should not be used as the sole basis for treatment or other patient management decisions. Negative results must be combined with clinical observations, patient history, and epidemiological information. The expected result is Negative. Fact Sheet for Patients: SugarRoll.be Fact Sheet for Healthcare Providers: https://www.woods-mathews.com/  This test is not yet approved or cleared by the [place] FDA and  has been authorized for detection and/or diagnosis of SARS-CoV-2 by FDA under an Emergency Use Authorization (EUA). This EUA will remain  in effect (meaning this test can be used) for the duration of the COVID-19 declaration under Section 56 4(b)(1) of the Act, 21 U.S.C. section 360bbb-3(b)(1), unless the authorization is terminated or revoked sooner. Performed at [facility], [place] [address] [place], [place] [address]   Culture, blood (routine x 2)     Status: None   Collection Time: 03/25/19  9:10 PM   Specimen: BLOOD RIGHT HAND  Result Value Ref Range Status   Specimen Description BLOOD RIGHT HAND  Final   Special Requests   Final    BOTTLES DRAWN AEROBIC AND ANAEROBIC Blood Culture results may not be optimal due to an inadequate volume of blood received in culture bottles   Culture   Final    NO GROWTH 5 DAYS Performed at [facility], [place] [address], [place], [place] [address]    Report Status 03/30/2019 FINAL  Final  Culture, blood (routine x 2)     Status: None   Collection Time: 03/26/19  9:13 AM   Specimen: BLOOD LEFT HAND  Result Value Ref Range Status   Specimen Description BLOOD LEFT HAND  Final   Special Requests   Final    BOTTLES DRAWN AEROBIC ONLY Blood Culture adequate volume   Culture   Final    NO GROWTH 5 DAYS Performed at [facility], [place] [address], [place], [place] [address]    Report Status 03/31/2019 FINAL  Final         Radiology Studies: No results found.      Scheduled Meds: . ALPRAZolam  0.5 mg Oral QHS  . amLODipine  5 mg Oral Daily  . dexamethasone  4 mg Oral Q8H  . lisinopril  40 mg Oral Daily  . loratadine  10 mg Oral Daily  . propranolol  40 mg Oral BID  . sodium chloride flush  3 mL Intravenous Once   Continuous Infusions:   LOS: 6 days    Time spent: 25 minutes    Barb Merino, MD [facility] Pager [phone]   If 7PM-7AM, please contact night-coverage [email or web address] Password [facility] 03/31/2019, 3:53 PM

## 2019-03-31 NOTE — Progress Notes (Signed)
Inpatient Rehab Admissions:  Inpatient Rehab Consult received.  I met with pt and his wife at the bedside for rehabilitation assessment. Based on current PT and OT notes today, AC would recommend SNF setting for rehab. Pt is Supervision for transfers, Min G 40 feet without AD. Pt is progressing quickly, and does not require an IP Rehab stay. However pt's wife does not feel comfortable with pt returning home at this time. AC reviewed rehab options and pt's wife very vocal about preferring SNF at this time.   AC will sign off. I have communicated recommendation to CM/SW.   Please call if questions.   Jhonnie Garner, OTR/L  Rehab Admissions Coordinator  [phone] 03/31/2019 4:03 PM

## 2019-03-31 NOTE — Progress Notes (Signed)
CM spoke to pt and wife over the phone about d/c planning and SNF choices. CM provided the patients wife with SNF that have offered a bed. She is going to get with her son who is a retired Audiological scientist and determine where they would like pt to go for rehab. Wife Randall Mcgee) gave permission for me to speak to son(Richard) in am on the decision for rehab.  TOC following.

## 2019-03-31 NOTE — Progress Notes (Signed)
Physical Therapy Treatment Patient Details Name: Randall Mcgee MRN: [id number] DOB: 1941/05/14 Today's Date: 03/31/2019    History of Present Illness Randall Mcgee  is a 78 y.o. male, w hypertension, hyperlipidemia, anxiety, remote history of melanoma on scalp 2017, admitted with gait instability since Sunday am prior to admission.  Also with a severe headache and memory issues.  Pt/wife report pt was in his usual state of health and [place] prior to Sunday.  Imaging shows multiple scattered intracranial hemorrhagic metastases thought to originate in the lungs.    PT Comments    Pt had just ambulated long distance with nursing with RW so worked on shorter distance ambulation without AD as well as higher level balance in standing. Also worked on compensating for L visual field cut and increased scanning of L side. Pt can verbalize need for this but does not do it functionally. PT will continue to follow.    Follow Up Recommendations  Supervision/Assistance - 24 hour;Other (comment);CIR(pt's wife unable to care for patient)     Equipment Recommendations  (TBD next venue)    Recommendations for Other Services       Precautions / Restrictions Precautions Precautions: Fall Restrictions Weight Bearing Restrictions: No    Mobility  Bed Mobility               General bed mobility comments: in recliner  Transfers Overall transfer level: Needs assistance   Transfers: Sit to/from Stand Sit to Stand: Supervision         General transfer comment: pt stood without assist , no LOB with static standing  Ambulation/Gait Ambulation/Gait assistance: Min guard Gait Distance (Feet): 40 Feet Assistive device: None Gait Pattern/deviations: Step-through pattern Gait velocity: decreased unless cued Gait velocity interpretation: 1.31 - 2.62 ft/sec, indicative of limited community ambulator General Gait Details: pt had just ambulated 500' with nursing with RW so worked on shorter  distances, no AD. worked on increasing attention to L side and changing gait speeds as well as practicing bkwds walking   Marine scientist [place] (Stroke Patients Only)       Balance Overall balance assessment: Needs assistance Sitting-balance support: No upper extremity supported;Single extremity supported Sitting balance-Leahy Scale: Good     Standing balance support: No upper extremity supported Standing balance-Leahy Scale: Fair Standing balance comment: worked on dynamic standing balance with catching and throwing in different planes and retrieving from floor. Also worked on [employer], tandem, and unilateral stance. Needed UE support for these                            Cognition Arousal/Alertness: Awake/alert Behavior During Therapy: [facility] for tasks assessed/performed Overall Cognitive Status: Within Functional Limits for tasks assessed                                        Exercises      General Comments General comments (skin integrity, edema, etc.): spouse very nervous about just the 2 of them being home      Pertinent Vitals/Pain Pain Assessment: No/denies pain    Home Living                      Prior Function  PT Goals (current goals can now be found in the care plan section) Acute Rehab PT Goals Patient Stated Goal: to leave here PT Goal Formulation: With patient Time For Goal Achievement: 04/04/19 Potential to Achieve Goals: Good Progress towards PT goals: Progressing toward goals    Frequency    Min 3X/week      PT Plan Discharge plan needs to be updated    Co-evaluation              AM-PAC PT "6 Clicks" Mobility   Outcome Measure  Help needed turning from your back to your side while in a flat bed without using bedrails?: A Little Help needed moving from lying on your back to sitting on the side of a flat bed without using bedrails?: A  Little Help needed moving to and from a bed to a chair (including a wheelchair)?: A Little Help needed standing up from a chair using your arms (e.g., wheelchair or bedside chair)?: A Little Help needed to walk in hospital room?: A Little Help needed climbing 3-5 steps with a railing? : A Little 6 Click Score: 18    End of Session Equipment Utilized During Treatment: Gait belt Activity Tolerance: Patient tolerated treatment well Patient left: in chair;with call bell/phone within reach;with family/visitor present Nurse Communication: Mobility status PT Visit Diagnosis: Unsteadiness on feet (R26.81);Other abnormalities of gait and mobility (R26.89);Other symptoms and signs involving the nervous system (R29.898)     Time: 1100-1135 PT Time Calculation (min) (ACUTE ONLY): 35 min  Charges:  $Gait Training: 8-22 mins $Therapeutic Exercise: 8-22 mins                     Leighton Roach, PT  Acute Rehab Services  Pager [phone] Office [place] 03/31/2019, 12:45 PM

## 2019-03-31 NOTE — Progress Notes (Signed)
Occupational Therapy Treatment Patient Details Name: Randall Mcgee MRN: [id number] DOB: 12-29-1940 Today's Date: 03/31/2019    History of present illness Randall Mcgee  is a 78 y.o. male, w hypertension, hyperlipidemia, anxiety, remote history of melanoma on scalp 2017, admitted with gait instability since Sunday am prior to admission.  Also with a severe headache and memory issues.  Pt/wife report pt was in his usual state of health and [place] prior to Sunday.  Imaging shows multiple scattered intracranial hemorrhagic metastases thought to originate in the lungs.   OT comments  Pt seated in recliner chair with breakfast tray in front of him. Pt reporting vision to be "getting better". Pt demonstrated B coordination tasks of cutting food on tray without difficulty. Food items placed on far L side of tray with pt needing increased time to locate but able to do so. Pt declines mobility at this time and wishes to continue eating. Pt continues to benefit from OT intervention to address functional deficits.   Follow Up Recommendations  SNF;Supervision/Assistance - 24 hour    Equipment Recommendations  None recommended by OT       Precautions / Restrictions Precautions Precautions: Fall       Mobility Bed Mobility      General bed mobility comments: in recliner       Balance Overall balance assessment: Needs assistance Sitting-balance support: No upper extremity supported;Single extremity supported Sitting balance-Leahy Scale: Good           ADL either performed or assessed with clinical judgement   ADL   Eating/Feeding: Supervision/ safety         Vision   Vision Assessment?: Yes;Vision impaired- to be further tested in functional context          Cognition Arousal/Alertness: Awake/alert Behavior During Therapy: WFL for tasks assessed/performed Overall Cognitive Status: Within Functional Limits for tasks assessed                Frequency  Min 2X/week         Progress Toward Goals  OT Goals(current goals can now be found in the care plan section)  Progress towards OT goals: Progressing toward goals  Acute Rehab OT Goals Patient Stated Goal: to leave here OT Goal Formulation: With patient Time For Goal Achievement: 04/12/19 Potential to Achieve Goals: Good  Plan Discharge plan remains appropriate       AM-PAC OT "6 Clicks" Daily Activity     Outcome Measure   Help from another person eating meals?: A Little Help from another person taking care of personal grooming?: A Little Help from another person toileting, which includes using toliet, bedpan, or urinal?: A Little Help from another person bathing (including washing, rinsing, drying)?: A Little Help from another person to put on and taking off regular upper body clothing?: A Little Help from another person to put on and taking off regular lower body clothing?: A Little 6 Click Score: 18    End of Session    OT Visit Diagnosis: Unsteadiness on feet (R26.81);Muscle weakness (generalized) (M62.81)   Activity Tolerance Patient tolerated treatment well   Patient Left in chair;with call bell/phone within reach;with chair alarm set   Nurse Communication Precautions        Time: [id number] OT Time Calculation (min): 15 min  Charges: OT General Charges $OT Visit: 1 Visit OT Treatments $Self Care/Home Management : 8-22 mins   Randall Mcgee P, MS, OTr/L 03/31/2019, 9:59 AM

## 2019-04-01 MED ORDER — ALPRAZOLAM 0.5 MG PO TABS: 0.5000 mg | ORAL_TABLET | Freq: Two times a day (BID) | ORAL | 0 refills | Status: AC | PRN

## 2019-04-01 NOTE — Progress Notes (Signed)
PROGRESS NOTE    Randall Mcgee  [id number] DOB: [birth year] DOA: 03/25/2019 PCP: Isaac Bliss, Rayford Halsted, MD    Brief Narrative:  78 year old gentleman with history of hypertension, hyperlipidemia, anxiety and remote history of scalp melanoma status post excision in 2017 came to the emergency room with about 1 to 2 weeks of stumbling and losing balance, not feeling well with low-grade fever since last few days, loss of concentration and memory lack.  Hemodynamically stable in the ER.  CT head showed multiple areas of acute hemorrhage with right posterior occipital lobe mass.  COVID-19 test was negative.  Chest x-ray was normal.  Admitted with metastatic brain cancer and hemorrhagic brain lesion. 03/26/2019: Overnight became paranoid, difficult to control behavior.  Improved now.  03/27/2019: Underwent percutaneous biopsy left supraclavicular gland, results pending.   Assessment & Plan:   Principal Problem:   ICH (intracerebral hemorrhage) (HCC) Active Problems:   Hypertension   Hypercholesterolemia   Impaired glucose tolerance  Intracerebral hemorrhage/metastatic brain cancer with hemorrhage and vasogenic edema: Suspect malignant melanoma versus lung cancer. Neurologically stable now.  Repeat CT head was without worsening bleeding. Avoid all NSAID's and aspirin. Treated with dexamethasone IV.  Currently on oral dexamethasone. Will treat with prolonged taper.   CT scan of the chest and abdomen showed lesion near the bronchus with pressure on the bronchus, intervention radiology was consulted and recommended pulmonary consult. Underwent lymph node biopsy from the left neck. Followed by radiation oncology and hematology oncology.  Accelerated hypertension: Blood pressures better now.  On his lisinopril and propanolol as well as added dose of amlodipine.  Will discharge on lisinopril, propranolol and amlodipine.  Hyperlipidemia: On statin at home.  Continue.  Abnormal blood sugar: A1c  6.1.  No indication for treatment.  Anticipate elevated blood sugars with steroid treatment, will hold off on initiating any treatment.    Paranoia/abnormal behavior: Suspect due to hospitalization, IV steroid use, brain lesion.   All-time fall precautions.  Will avoid narcotics.  He does occasionally takes small dose of Xanax at home that he should continue. Mostly improved.  No more paranoid.  COVID-19 test in anticipation of discharge to skilled nursing facility.   DVT prophylaxis: SCDs Code Status: Full code Family Communication: Wife.   Disposition Plan: Skilled nursing facility.  Pending bed availability.  Medically stable.   Consultants:   Oncology  Neurology  Radiation oncology  Pulmonary  Procedures:   None  Antimicrobials:   None   Subjective: No overnight events.   Objective: Vitals:   03/31/19 2112 04/01/19 0020 04/01/19 0409 04/01/19 0700  BP: 127/83 124/78 119/67 132/72  Pulse: (!) 57 62 (!) 54 64  Resp: 18 18 17 18   Temp: 98.7 F (37.1 C) 98.5 F (36.9 C) 98.2 F (36.8 C) 98.4 F (36.9 C)  TempSrc: Oral Oral Oral Oral  SpO2: 96% 95% 94% 96%    Intake/Output Summary (Last 24 hours) at 04/01/2019 1033 Last data filed at 04/01/2019 0900 Gross per 24 hour  Intake 422 ml  Output 200 ml  Net 222 ml   There were no vitals filed for this visit.  Examination:  General exam: Quiet and calm, alert oriented x3.  He is very well composed. Respiratory system: Clear to auscultation. Respiratory effort normal. Cardiovascular system: S1 & S2 heard, RRR. No JVD, murmurs, rubs, gallops or clicks. No pedal edema. Gastrointestinal system: Abdomen is nondistended, soft and nontender. No organomegaly or masses felt. Normal bowel sounds heard. Central nervous system: Alert and oriented.  Loss of left outer quadrant visual field. Extremities: Symmetric 5 x 5 power. Skin: No rashes, lesions or ulcers Psychiatry: Judgement and insight appear normal.  Mood and  affect anxious.    Data Reviewed: I have personally reviewed following labs and imaging studies  CBC: Recent Labs  Lab 03/25/19 1354 03/27/19 0825  WBC 7.0 8.4  NEUTROABS  --  6.8  HGB 13.4 14.5  HCT 39.6 40.8  MCV 100.0 94.0  PLT 201 [id number]   Basic Metabolic Panel: Recent Labs  Lab 03/25/19 1354 03/27/19 0825  NA 141 139  K 4.3 4.1  CL 106 103  CO2 26 22  GLUCOSE 183* 170*  BUN 15 25*  CREATININE 1.26* 1.18  CALCIUM 9.7 9.7  MG  --  2.0  PHOS  --  3.7   GFR: CrCl cannot be calculated (Unknown ideal weight.). Liver Function Tests: Recent Labs  Lab 03/25/19 1354  AST 48*  ALT 24  ALKPHOS 41  BILITOT 0.6  PROT 6.8  ALBUMIN 3.8   No results for input(s): LIPASE, AMYLASE in the last 168 hours. No results for input(s): AMMONIA in the last 168 hours. Coagulation Profile: Recent Labs  Lab 03/25/19 2110  INR 1.1   Cardiac Enzymes: Recent Labs  Lab 03/25/19 2110  CKTOTAL 164  CKMB 2.7   BNP (last 3 results) No results for input(s): PROBNP in the last 8760 hours. HbA1C: No results for input(s): HGBA1C in the last 72 hours. CBG: No results for input(s): GLUCAP in the last 168 hours. Lipid Profile: No results for input(s): CHOL, HDL, LDLCALC, TRIG, CHOLHDL, LDLDIRECT in the last 72 hours. Thyroid Function Tests: No results for input(s): TSH, T4TOTAL, FREET4, T3FREE, THYROIDAB in the last 72 hours. Anemia Panel: No results for input(s): VITAMINB12, FOLATE, FERRITIN, TIBC, IRON, RETICCTPCT in the last 72 hours. Sepsis Labs: No results for input(s): PROCALCITON, LATICACIDVEN in the last 168 hours.  Recent Results (from the past 240 hour(s))  Novel Coronavirus, NAA ([facility])     Status: None   Collection Time: 03/25/19 12:00 AM   Specimen: Oropharyngeal(OP) collection in vial transport medium   OROPHARYNGEA  TESTING  Result Value Ref Range Status   SARS-CoV-2, NAA Not Detected Not Detected Final    Comment: This nucleic acid amplification test was  developed and its perfomance characteristics determined by [employer]. Nucleic acid amplification tests include PCR and TMA. This test has not been FDA cleared or approved. This test has been authorized by FDA under an Emergency Use Authorization (EUA). This test is only authorized for the duration of time the declaration that circumstances exist justifying the authorization of the emergency use of in vitro diagnostic tests for detection of SARS-CoV-2 virus and/or diagnosis of COVID-19 infection under section 564(b)(1) of the Act, 21 U.S.C. [id number]) (1), unless the authorization is terminated or revoked sooner. When diagnostic testing is negative, the possibility of a false negative result should be considered in the context of a patient's recent exposures and the presence of clinical signs and symptoms consistent with COVID-19. An individual without symptoms of COVID-19 and who is not shedding SARS-CoV-2 virus would  expect to have a negative (not detected) result in this assay.   SARS CORONAVIRUS 2 (TAT 6-24 HRS) Nasopharyngeal Nasopharyngeal Swab     Status: None   Collection Time: 03/25/19  7:41 PM   Specimen: Nasopharyngeal Swab  Result Value Ref Range Status   SARS Coronavirus 2 NEGATIVE NEGATIVE Final    Comment: (NOTE) SARS-CoV-2 target nucleic acids  are NOT DETECTED. The SARS-CoV-2 RNA is generally detectable in upper and lower respiratory specimens during the acute phase of infection. Negative results do not preclude SARS-CoV-2 infection, do not rule out co-infections with other pathogens, and should not be used as the sole basis for treatment or other patient management decisions. Negative results must be combined with clinical observations, patient history, and epidemiological information. The expected result is Negative. Fact Sheet for Patients: SugarRoll.be Fact Sheet for Healthcare Providers:  https://www.woods-mathews.com/ This test is not yet approved or cleared by the [place] FDA and  has been authorized for detection and/or diagnosis of SARS-CoV-2 by FDA under an Emergency Use Authorization (EUA). This EUA will remain  in effect (meaning this test can be used) for the duration of the COVID-19 declaration under Section 56 4(b)(1) of the Act, 21 U.S.C. section 360bbb-3(b)(1), unless the authorization is terminated or revoked sooner. Performed at [facility], [place] [address], [place], [place] [address]   Culture, blood (routine x 2)     Status: None   Collection Time: 03/25/19  9:10 PM   Specimen: BLOOD RIGHT HAND  Result Value Ref Range Status   Specimen Description BLOOD RIGHT HAND  Final   Special Requests   Final    BOTTLES DRAWN AEROBIC AND ANAEROBIC Blood Culture results may not be optimal due to an inadequate volume of blood received in culture bottles   Culture   Final    NO GROWTH 5 DAYS Performed at [facility], [place] [address], [place], [place] [address]    Report Status 03/30/2019 FINAL  Final  Culture, blood (routine x 2)     Status: None   Collection Time: 03/26/19  9:13 AM   Specimen: BLOOD LEFT HAND  Result Value Ref Range Status   Specimen Description BLOOD LEFT HAND  Final   Special Requests   Final    BOTTLES DRAWN AEROBIC ONLY Blood Culture adequate volume   Culture   Final    NO GROWTH 5 DAYS Performed at [facility], [place] [address], [place], [place] [address]    Report Status 03/31/2019 FINAL  Final         Radiology Studies: No results found.      Scheduled Meds: . ALPRAZolam  0.5 mg Oral QHS  . amLODipine  5 mg Oral Daily  . dexamethasone  4 mg Oral Q8H  . lisinopril  40 mg Oral Daily  . loratadine  10 mg Oral Daily  . propranolol  40 mg Oral BID  . sodium chloride flush  3 mL Intravenous Once   Continuous Infusions:   LOS: 7 days    Time spent: 25 minutes    Barb Merino, MD  [facility] Pager [phone]  If 7PM-7AM, please contact night-coverage [email or web address] Password [facility] 04/01/2019, 10:33 AM

## 2019-04-01 NOTE — Progress Notes (Signed)
Spoke with case manager Vida Roller to inform her that he is refusing to take the COVID test at this time because he is not going to take it until she gets the info that she needs from his insurance and a nursing home because he is not going to suffer through the test and find out that he is not going to be able to leave yet and have to take another.  This nurse explained that he would not be taking this test daily, as he said he would not.  He said he would just rather wait until he knows for sure.  This nurse explained that this would possibly delay him leaving but he said he did not care.  This nurse did just leave message for Dr Sloan Leiter.

## 2019-04-02 ENCOUNTER — Other Ambulatory Visit: Payer: Self-pay | Admitting: Urology

## 2019-04-02 DIAGNOSIS — C7931 Secondary malignant neoplasm of brain: Secondary | ICD-10-CM

## 2019-04-02 LAB — SARS CORONAVIRUS 2 (TAT 6-24 HRS): SARS Coronavirus 2: NEGATIVE

## 2019-04-02 MED ORDER — DEXAMETHASONE 4 MG PO TABS: 4.0000 mg | ORAL_TABLET | Freq: Three times a day (TID) | ORAL | Status: DC

## 2019-04-02 MED ORDER — AMLODIPINE BESYLATE 5 MG PO TABS: 5.0000 mg | ORAL_TABLET | Freq: Every day | ORAL | Status: DC

## 2019-04-02 MED ORDER — VITAMIN B-12 1000 MCG PO TABS: 1000.0000 ug | ORAL_TABLET | Freq: Every day | ORAL | Status: AC

## 2019-04-02 MED ORDER — ACETAMINOPHEN 325 MG PO TABS: 650.0000 mg | ORAL_TABLET | Freq: Four times a day (QID) | ORAL | Status: AC | PRN

## 2019-04-02 NOTE — Progress Notes (Signed)
Brief Oncology note:  Patient's biopsy of the L supraclavicular lymph node consistent with metastatic melanoma. I have set him up for follow-up with Medical Oncology and have notified Radiation Oncology of biopsy results and pending discharge. They will arrange for out patient follow-up. Continue current dose of Dexamethasone. Will taper as outpatient.   Mikey Bussing, DNP, AGPCNP-BC, AOCNP

## 2019-04-02 NOTE — Progress Notes (Signed)
Physical Therapy Treatment Patient Details Name: Randall Mcgee MRN: [id number] DOB: 09-Jun-1941 Today's Date: 04/02/2019    History of Present Illness Randall Mcgee  is a 78 y.o. male, w hypertension, hyperlipidemia, anxiety, remote history of melanoma on scalp 2017, admitted with gait instability since Sunday am prior to admission.  Also with a severe headache and memory issues.  Pt/wife report pt was in his usual state of health and [place] prior to Sunday.  Imaging shows multiple scattered intracranial hemorrhagic metastases thought to originate in the lungs.    PT Comments    Pt progressing well. Pt compensates well with L vision deficits. Pt cont to be at moderate falls risk as pt scored 16 on DGI. Pt amb with improved stability with RW at this time. Cont to recommend ST-SNF upon d/c to maximize functional recovery and decrease fall risk. Acute PT to cont to follow.   Follow Up Recommendations  SNF     Equipment Recommendations       Recommendations for Other Services       Precautions / Restrictions Precautions Precautions: Fall Precaution Comments: L visual field deficit however compenstates well Restrictions Weight Bearing Restrictions: No    Mobility  Bed Mobility               General bed mobility comments: in recliner  Transfers Overall transfer level: Needs assistance Equipment used: None Transfers: Sit to/from Stand Sit to Stand: Supervision         General transfer comment: pt used arm rests to push up from, steady, no difficulty  Ambulation/Gait Ambulation/Gait assistance: Min guard Gait Distance (Feet): 300 Feet Assistive device: None;Rolling walker (2 wheeled) Gait Pattern/deviations: Step-through pattern;Decreased stride length;Wide base of support   Gait velocity interpretation: 1.31 - 2.62 ft/sec, indicative of limited community ambulator General Gait Details: pt amb with RW and had increased step length and height with fast gait  speed. Without RW pt with wider base of support decreased step height, and length. Pt reaching for hallway rail when amb without AD   Stairs             Wheelchair Mobility    Modified Rankin (Stroke Patients Only) Modified Rankin (Stroke Patients Only) Pre-Morbid Rankin Score: No significant disability Modified Rankin: Slight disability     Balance Overall balance assessment: Needs assistance Sitting-balance support: No upper extremity supported;Single extremity supported Sitting balance-Leahy Scale: Good     Standing balance support: No upper extremity supported Standing balance-Leahy Scale: Fair Standing balance comment: unable to complete tandom stance                 Standardized Balance Assessment Standardized Balance Assessment : Dynamic Gait Index   Dynamic Gait Index Level Surface: Mild Impairment Change in Gait Speed: Mild Impairment Gait with Horizontal Head Turns: Mild Impairment Gait with Vertical Head Turns: Mild Impairment Gait and Pivot Turn: Mild Impairment Step Over Obstacle: Mild Impairment Step Around Obstacles: Mild Impairment Steps: Mild Impairment Total Score: 16      Cognition Arousal/Alertness: Awake/alert Behavior During Therapy: WFL for tasks assessed/performed Overall Cognitive Status: Within Functional Limits for tasks assessed                                        Exercises      General Comments General comments (skin integrity, edema, etc.): worked on tandem stance and ambulation to progress balance to minimize  falls risk      Pertinent Vitals/Pain Pain Assessment: No/denies pain    Home Living                      Prior Function            PT Goals (current goals can now be found in the care plan section) Progress towards PT goals: Progressing toward goals    Frequency    Min 3X/week      PT Plan Current plan remains appropriate    Co-evaluation               AM-PAC PT "6 Clicks" Mobility   Outcome Measure  Help needed turning from your back to your side while in a flat bed without using bedrails?: None Help needed moving from lying on your back to sitting on the side of a flat bed without using bedrails?: None Help needed moving to and from a bed to a chair (including a wheelchair)?: A Little Help needed standing up from a chair using your arms (e.g., wheelchair or bedside chair)?: A Little Help needed to walk in hospital room?: A Little Help needed climbing 3-5 steps with a railing? : A Little 6 Click Score: 20    End of Session Equipment Utilized During Treatment: Gait belt Activity Tolerance: Patient tolerated treatment well Patient left: in chair;with call bell/phone within reach;with family/visitor present Nurse Communication: Mobility status PT Visit Diagnosis: Unsteadiness on feet (R26.81);Other abnormalities of gait and mobility (R26.89);Other symptoms and signs involving the nervous system [id number])     Time: [id number] PT Time Calculation (min) (ACUTE ONLY): 21 min  Charges:  $Gait Training: 8-22 mins                     Kittie Plater, PT, DPT Acute Rehabilitation Services Pager #: [phone] Office #: [phone]    Randall Mcgee 04/02/2019, 9:32 AM

## 2019-04-02 NOTE — Discharge Summary (Addendum)
Physician Discharge Summary  Randall Mcgee [name] DOB: 12-Feb-1941 DOA: 03/25/2019  PCP: Isaac Bliss, Rayford Halsted, MD  Admit date: 03/25/2019 Discharge date: 04/02/2019  Admitted From: Home. Disposition: Skilled nursing home.  Recommendations for Outpatient Follow-up:  1. Follow-up will be scheduled by oncology team.  Home Health: Not applicable Equipment/Devices: Not applicable  Discharge Condition: Stable CODE STATUS: Full code Diet recommendation: Low-salt diet  Discharge summary: 78 year old gentleman with history of hypertension, hyperlipidemia, anxiety and remote history of scalp melanoma status post excision in 2017 came to the emergency room with about 1 to 2 weeks of stumbling and losing balance, not feeling well with low-grade fever since last few days, loss of concentration and memory lack.  Hemodynamically stable in the ER.  CT head showed multiple areas of acute hemorrhage with right posterior occipital lobe mass.  COVID-19 test was negative.  Chest x-ray was normal.  Admitted with metastatic brain cancer and hemorrhagic brain lesion.  Underwent biopsy of the left supraclavicular node consistent with metastatic melanoma.  Currently stable.  Intracerebral hemorrhage with metastatic melanoma, metastasis to lungs and brain with hemorrhage and vasogenic edema: Neurologically stable now.  Repeat CT head was without worsening bleeding. Avoid all NSAID's and aspirin. Treated with dexamethasone IV.  Currently on oral dexamethasone.  Continue dexamethasone. Followed by radiation oncology and hematology oncology. Patient needing short-term rehab, will transfer to skilled nursing rehab in the meantime is planned for treatment.   Essential hypertension: Blood pressures better now.  On his lisinopril and propanolol as well as added dose of amlodipine.  Will discharge on lisinopril, propranolol and amlodipine.  Hyperlipidemia: On statin at home.  We will continue to hold in view of  intracranial hemorrhage.  Abnormal blood sugar: A1c 6.1.  No indication for treatment.  Anticipate elevated blood sugars with steroid treatment, will hold off on initiating any treatment.    Patient had developed some paranoid behavior on initial hospitalization, however now he did very well.  He has no more paranoia.  Patient is stable to transfer to skilled level of care pending further treatment plans.   Discharge Diagnoses:  Principal Problem:   ICH (intracerebral hemorrhage) ([name]) Active Problems:   Hypertension   Hypercholesterolemia   Impaired glucose tolerance    Discharge Instructions  Discharge Instructions    Diet - low sodium heart healthy   Complete by: As directed    Increase activity slowly   Complete by: As directed      Allergies as of 04/02/2019   No Known Allergies     Medication List    STOP taking these medications   aspirin 81 MG tablet   fluorouracil 5 % cream Commonly known as: EFUDEX   ibuprofen 200 MG tablet Commonly known as: ADVIL   indomethacin 25 MG capsule Commonly known as: INDOCIN   sildenafil 20 MG tablet Commonly known as: REVATIO   simvastatin 40 MG tablet Commonly known as: ZOCOR     TAKE these medications   acetaminophen 325 MG tablet Commonly known as: TYLENOL Take 2 tablets (650 mg total) by mouth every 6 (six) hours as needed for mild pain or headache.   ALPRAZolam 0.5 MG tablet Commonly known as: XANAX Take 1 tablet (0.5 mg total) by mouth 2 (two) times daily as needed for up to 5 days for anxiety or sleep. Take 0.5 mg by mouth at bedtime and an additional 0.5 mg once a day as needed for anxiety What changed: See the new instructions.   amLODipine 5  MG tablet Commonly known as: NORVASC Take 1 tablet (5 mg total) by mouth daily. Start taking on: April 03, 2019   CENTRUM SILVER PO Take 1 tablet by mouth daily.   cetirizine 10 MG tablet Commonly known as: ZYRTEC Take 1 tablet (10 mg total) by mouth  daily. What changed: when to take this   dexamethasone 4 MG tablet Commonly known as: DECADRON Take 1 tablet (4 mg total) by mouth every 8 (eight) hours.   lisinopril 40 MG tablet Commonly known as: ZESTRIL Take 1 tablet (40 mg total) by mouth daily.   propranolol 20 MG tablet Commonly known as: INDERAL Take 40 mg by mouth 2 (two) times daily. What changed: Another medication with the same name was removed. Continue taking this medication, and follow the directions you see here.   vitamin B-12 1000 MCG tablet Commonly known as: CYANOCOBALAMIN Take 1 tablet (1,000 mcg total) by mouth daily. What changed:   medication strength  how much to take       No Known Allergies  Consultations:  Neurology  Oncology  Radiation oncology   Procedures/Studies: Ct Head Wo Contrast  Result Date: 03/27/2019 CLINICAL DATA:  7-year-old male with follow-up intracranial hemorrhagic metastasis. EXAM: CT HEAD WITHOUT CONTRAST TECHNIQUE: Contiguous axial images were obtained from the base of the skull through the vertex without intravenous contrast. COMPARISON:  Head CT dated 03/25/2019 FINDINGS: Brain: No significant interval change in the size of the right occipital hemorrhagic mass measuring approximately 5 x 3 cm in axial dimensions. Additional smaller hemorrhagic lesions involving the left occipital lobe, and left temporal lobe appear unchanged. There is associated edema with mild mass effect 5 on the surrounding tissue. No new hemorrhage identified. No midline shift. There is mild age-related atrophy and chronic microvascular ischemic changes. Vascular: No hyperdense vessel or unexpected calcification. Skull: Normal. Negative for fracture or focal lesion. Sinuses/Orbits: Mild mucoperiosteal thickening paranasal sinuses. No air-fluid level. The mastoid air cells are clear. Other: None IMPRESSION: No significant interval change in the size of the known hemorrhagic metastases or associated edema.  No new hemorrhage or midline shift. Continued follow-up recommended. Electronically Signed   By: Anner Crete M.D.   On: 03/27/2019 13:30   Ct Head Wo Contrast  Result Date: 03/25/2019 CLINICAL DATA:  Ct head wo, Pt's wife reports that Sunday morning he woke her up and was extremely disoriented and confused. Pt reported to wife he had a bad headache and temperature. EXAM: CT HEAD WITHOUT CONTRAST TECHNIQUE: Contiguous axial images were obtained from the base of the skull through the vertex without intravenous contrast. COMPARISON:  None. FINDINGS: Brain: There are 3 areas of acute masslike hemorrhage in the bilateral occipital and left temporal lobes. The largest area in the right occipital lobe measures 5.0 x 3.2 cm. A small area in the posterior left occipital lobe measures 1.6 by 1.8 cm. And another area in the left temporal lobe measures 1.4 by 1.1 cm. There is mass effect on the occipital hormone of the right lateral ventricle secondary to edema surrounding the hemorrhage in the posterior right occipital lobe. No midline shift. Mild enlargement of the ventricles is likely secondary to ex vacuo dilation from brain atrophy. Vascular: No hyperdense vessel or unexpected calcification. Skull: Normal. Negative for fracture or focal lesion. Sinuses/Orbits: Mucosal thickening in the bilateral maxillary and ethmoid sinuses. Other: None. IMPRESSION: Multiple areas of acute hemorrhage in the bilateral occipital and left temporal lobes. The largest area in the right posterior occipital lobe measures  5 x 3 cm with some mass effect on the right lateral ventricle. Differential considerations include hemorrhagic metastasis and emboli. Consider further evaluation with brain MRI. These results were called by telephone at the time of interpretation on 03/25/2019 at 6:55 pm to Dr. Janeece Fitting , who verbally acknowledged these results. Electronically Signed   By: Audie Pinto M.D.   On: 03/25/2019 19:05   Ct Chest W  Contrast  Result Date: 03/26/2019 CLINICAL DATA:  Liver/biliary neoplasm. EXAM: CT CHEST, ABDOMEN, AND PELVIS WITH CONTRAST TECHNIQUE: Multidetector CT imaging of the chest, abdomen and pelvis was performed following the standard protocol during bolus administration of intravenous contrast. CONTRAST:  122mL ISOVUE-300 IOPAMIDOL (ISOVUE-300) INJECTION 61% COMPARISON:  Abdominal ultrasound dated 03/25/2019 FINDINGS: CT CHEST FINDINGS Cardiovascular: Normal heart size. No pericardial effusion. Calcific atherosclerotic disease of the coronary arteries and to mild degree the aorta. Mediastinum/Nodes: Marked bilateral mediastinal lymphadenopathy with abnormal lymph nodes in left paratracheal, left prevascular, left precarinal, central subcarinal stations, as well as right hilar and peribronchial stations. Additional abnormal lymph node in left supra clavicular location. The largest conglomerate of abnormal appearing lymph nodes in the subcarinal region measures 2.9 x 5.4 cm. The trachea is patent. Lungs/Pleura: There is a large subpleural/peri diaphragmatic soft tissue mass in the right lower lobe measuring 3.4 by 4.1 by 3.5 cm. Tiny 2-3 mm perifissural soft tissue nodule is seen in the right middle lobe, image 103/142, sequence 4. A second soft tissue nodule in the more inferior right middle lobe measures 3 mm, image 109/142, sequence 4. There are areas of linear atelectasis in the bilateral lung bases and lingula. Musculoskeletal: No chest wall mass or suspicious bone lesions identified. CT ABDOMEN PELVIS FINDINGS Hepatobiliary: No focal liver abnormality is seen. Status post cholecystectomy. No biliary dilatation. Pancreas: Unremarkable. No pancreatic ductal dilatation or surrounding inflammatory changes. Spleen: Normal in size without focal abnormality. Adrenals/Urinary Tract: 1.4 cm indeterminate right adrenal mass. The left adrenal gland is normal. The kidneys are normal. No left renal masses appreciated. No  hydronephrosis or nephrolithiasis. There is a right peritrigonal bladder diverticulum. Stomach/Bowel: Stomach is within normal limits. Appendix appears normal. No evidence of bowel wall thickening, distention, or inflammatory changes. Left colonic diverticulosis without evidence of diverticulitis. Vascular/Lymphatic: Aortic atherosclerosis. No enlarged abdominal or pelvic lymph nodes. Reproductive: Enlarged prostate gland. Other: Bilateral fat containing inguinal hernias. Musculoskeletal: No acute osseous findings. Spondylosis of the spine. IMPRESSION: 1. 3.4 cm right lower lobe subpleural/peri diaphragmatic soft tissue pulmonary mass, highly suspicious for primary pulmonary malignancy or metastatic lesion. 2. Marked bilateral mediastinal and right hilar/peribronchial lymphadenopathy. 3. The mid esophagus is compressed or contiguous with the left paratracheal lymphadenopathy/mass. Esophageal involvement cannot be excluded. 4. 1.4 cm indeterminate right adrenal mass. 5. Left colonic diverticulosis without evidence of diverticulitis. 6. Bilateral fat containing inguinal hernias. 7. Enlarged prostate gland. Please correlate to serum PSA values. Aortic Atherosclerosis (ICD10-I70.0). These results will be called to the ordering clinician or representative by the Radiologist Assistant, and communication documented in the PACS or zVision Dashboard. Electronically Signed   By: Fidela Salisbury M.D.   On: 03/26/2019 14:00   Mr Brain W And Wo Contrast  Result Date: 03/26/2019 CLINICAL DATA:  Follow-up examination for acute intracranial hemorrhage. Altered mental status. EXAM: MRI HEAD WITHOUT AND WITH CONTRAST TECHNIQUE: Multiplanar, multiecho pulse sequences of the brain and surrounding structures were obtained without and with intravenous contrast. CONTRAST:  10 cc of Gadavist. COMPARISON:  Prior CT from 03/25/2019. FINDINGS: Brain: Mild age-related cerebral atrophy  with chronic microvascular ischemic disease. Few  scattered superimposed remote lacunar infarcts noted within the right basal ganglia and left thalamus. Previously identified intraparenchymal hematoma centered at the right parieto-occipital region again seen, measuring approximately 4.1 x 4.3 x 3.9 cm (estimated volume 34 cc). Underlying metastatic lesion is presumably present. Surrounding vasogenic edema throughout the right parieto-occipital region with mild mass effect on the adjacent atrium of the right lateral ventricle. No definite intraventricular extension of hemorrhage. Associated trace right-to-left midline shift of the septum pellucidum. Basilar cisterns remain patent. Multiple additional scattered enhancing masses seen involving the bilateral cerebral hemispheres, all of which demonstrate associated susceptibility artifact, consistent with hemorrhagic metastases. These are seen as follows; punctate enhancing lesion at the posterior left centrum semi ovale (series 18, image 45), 8 mm enhancing lesion at the right parietal cortex (series 18, image 43), 5 mm lesion involving the cortical gray matter of the anterior left frontal lobe (series 18, image 42), 7 mm lesion along the cortex of the anterior right frontal lobe (series 18, image 34), 11 mm dural-based lesion at the right occipital lobe (series 18, image 28), adjacent punctate 4 mm lesion (series 18, image 25), 14 mm lesion at the anterior left temporal lobe (series 18, image 25), 20 x 19 mm dural-based lesion at the left occipital lobe (series 18, image 23). Additional possible punctate lesion at the anterior temporal left temporal pole (series 18, image 18), not entirely certain given small size. Relative sparing of the brainstem and cerebellum. No evidence for acute infarct. No extra-axial fluid collection. No hydrocephalus or ventricular trapping. No other acute intracranial abnormality. Vascular: Major intracranial vascular flow voids are maintained. Skull and upper cervical spine:  Craniocervical junction within normal limits. Degenerative spondylolysis noted at C2-3 without significant stenosis. No focal marrow replacing lesion. Postsurgical changes noted at the left frontal scalp. Sinuses/Orbits: Patient status post bilateral ocular lens replacement. Globes and orbital soft tissues demonstrate no acute finding. Scattered mucosal thickening noted within the ethmoidal air cells and maxillary sinuses with superimposed small maxillary sinus retention cyst. No mastoid effusion. Inner ear structures grossly normal. Other: None. IMPRESSION: 1. Multiple scattered intracranial hemorrhagic metastases as detailed above. One of these lesions at the parieto-occipital region has bled with associated 34 cc intraparenchymal hematoma. Associated regional mass effect with trace right-to-left midline shift. 2. Underlying age-related cerebral atrophy with mild chronic small vessel ischemic disease. Electronically Signed   By: Jeannine Boga M.D.   On: 03/26/2019 03:13   US Abdomen Complete  Result Date: 03/25/2019 CLINICAL DATA:  Abnormal LFTs. EXAM: ABDOMEN ULTRASOUND COMPLETE COMPARISON:  None. FINDINGS: Gallbladder: The gallbladder surgically absent. Common bile duct: Diameter: 3 mm Liver: The liver echogenicity appears increased. The hepatic echotexture is coarsened and heterogeneous. Portal vein is patent on color Doppler imaging with normal direction of blood flow towards the liver. IVC: No abnormality visualized. Pancreas: The pancreas is poorly evaluated secondary to overlying bowel gas. Spleen: Size and appearance within normal limits. Right Kidney: Length: 9.3 cm. Echogenicity within normal limits. No mass or hydronephrosis visualized. Left Kidney: Length: 11 cm. There is a hypoechoic 3.3 x 2.9 by 3 cm mass in the interpolar region of the left kidney. This mass appears to demonstrate some internal color Doppler flow. There is no hydronephrosis. Abdominal aorta: No aneurysm visualized. Other  findings: None. IMPRESSION: 1. Limited study secondary to overlying bowel gas and patient body habitus. 2. Status post cholecystectomy. 3. Coarsened heterogeneous appearance of the liver is nonspecific and may be secondary to  underlying hepatocellular disease or hepatic steatosis. 4. Indeterminate 3 cm mass involving the interpolar region of the left kidney. Follow-up with a nonemergent outpatient contrast enhanced CT or MRI is recommended for further evaluation of this finding. 5. Pancreas not well evaluated secondary to overlying bowel gas. Electronically Signed   By: Constance Holster M.D.   On: 03/25/2019 23:10   Ct Abdomen Pelvis W Contrast  Result Date: 03/26/2019 CLINICAL DATA:  Liver/biliary neoplasm. EXAM: CT CHEST, ABDOMEN, AND PELVIS WITH CONTRAST TECHNIQUE: Multidetector CT imaging of the chest, abdomen and pelvis was performed following the standard protocol during bolus administration of intravenous contrast. CONTRAST:  145mL ISOVUE-300 IOPAMIDOL (ISOVUE-300) INJECTION 61% COMPARISON:  Abdominal ultrasound dated 03/25/2019 FINDINGS: CT CHEST FINDINGS Cardiovascular: Normal heart size. No pericardial effusion. Calcific atherosclerotic disease of the coronary arteries and to mild degree the aorta. Mediastinum/Nodes: Marked bilateral mediastinal lymphadenopathy with abnormal lymph nodes in left paratracheal, left prevascular, left precarinal, central subcarinal stations, as well as right hilar and peribronchial stations. Additional abnormal lymph node in left supra clavicular location. The largest conglomerate of abnormal appearing lymph nodes in the subcarinal region measures 2.9 x 5.4 cm. The trachea is patent. Lungs/Pleura: There is a large subpleural/peri diaphragmatic soft tissue mass in the right lower lobe measuring 3.4 by 4.1 by 3.5 cm. Tiny 2-3 mm perifissural soft tissue nodule is seen in the right middle lobe, image 103/142, sequence 4. A second soft tissue nodule in the more inferior right  middle lobe measures 3 mm, image 109/142, sequence 4. There are areas of linear atelectasis in the bilateral lung bases and lingula. Musculoskeletal: No chest wall mass or suspicious bone lesions identified. CT ABDOMEN PELVIS FINDINGS Hepatobiliary: No focal liver abnormality is seen. Status post cholecystectomy. No biliary dilatation. Pancreas: Unremarkable. No pancreatic ductal dilatation or surrounding inflammatory changes. Spleen: Normal in size without focal abnormality. Adrenals/Urinary Tract: 1.4 cm indeterminate right adrenal mass. The left adrenal gland is normal. The kidneys are normal. No left renal masses appreciated. No hydronephrosis or nephrolithiasis. There is a right peritrigonal bladder diverticulum. Stomach/Bowel: Stomach is within normal limits. Appendix appears normal. No evidence of bowel wall thickening, distention, or inflammatory changes. Left colonic diverticulosis without evidence of diverticulitis. Vascular/Lymphatic: Aortic atherosclerosis. No enlarged abdominal or pelvic lymph nodes. Reproductive: Enlarged prostate gland. Other: Bilateral fat containing inguinal hernias. Musculoskeletal: No acute osseous findings. Spondylosis of the spine. IMPRESSION: 1. 3.4 cm right lower lobe subpleural/peri diaphragmatic soft tissue pulmonary mass, highly suspicious for primary pulmonary malignancy or metastatic lesion. 2. Marked bilateral mediastinal and right hilar/peribronchial lymphadenopathy. 3. The mid esophagus is compressed or contiguous with the left paratracheal lymphadenopathy/mass. Esophageal involvement cannot be excluded. 4. 1.4 cm indeterminate right adrenal mass. 5. Left colonic diverticulosis without evidence of diverticulitis. 6. Bilateral fat containing inguinal hernias. 7. Enlarged prostate gland. Please correlate to serum PSA values. Aortic Atherosclerosis (ICD10-I70.0). These results will be called to the ordering clinician or representative by the Radiologist Assistant, and  communication documented in the PACS or zVision Dashboard. Electronically Signed   By: Fidela Salisbury M.D.   On: 03/26/2019 14:00   Dg Chest Portable 1 View  Result Date: 03/25/2019 CLINICAL DATA:  Chest pain and shortness of breath. EXAM: PORTABLE CHEST 1 VIEW COMPARISON:  Chest x-ray dated June 13, 2011. FINDINGS: The heart size and mediastinal contours are within normal limits. Normal pulmonary vascularity. No focal consolidation, pleural effusion, or pneumothorax. Unchanged mild elevation of the left hemidiaphragm. No acute osseous abnormality. IMPRESSION: No active disease. Electronically Signed  By: Titus Dubin M.D.   On: 03/25/2019 16:43   [place] Fna Soft Tissue  Result Date: 03/27/2019 INDICATION: No known primary, now with concern for metastatic lung cancer. Please perform ultrasound-guided biopsy of indeterminate left supraclavicular lymph node for tissue diagnostic purposes. EXAM: ULTRASOUND-GUIDED LEFT SUPRACLAVICULAR LYMPH NODE BIOPSY COMPARISON:  CT the chest, abdomen and pelvis-03/26/2019 MEDICATIONS: None ANESTHESIA/SEDATION: Moderate (conscious) sedation was employed during this procedure. A total of Versed 1 mg and Fentanyl 25 mcg was administered intravenously. Moderate Sedation Time: 10 minutes. The patient's level of consciousness and vital signs were monitored continuously by radiology nursing throughout the procedure under my direct supervision. COMPLICATIONS: None immediate. TECHNIQUE: Informed written consent was obtained from the patient after a discussion of the risks, benefits and alternatives to treatment. Questions regarding the procedure were encouraged and answered. Initial ultrasound scanning demonstrated an approximately 1.8 x 1.4 cm left supraclavicular lymph node (image 4) correlating with the lymph node seen on preceding CT scan image 14, series 3). An ultrasound image was saved for documentation purposes. The procedure was planned. A timeout was performed prior  to the initiation of the procedure. The operative was prepped and draped in the usual sterile fashion, and a sterile drape was applied covering the operative field. A timeout was performed prior to the initiation of the procedure. Local anesthesia was provided with 1% lidocaine with epinephrine. Under direct ultrasound guidance, an 18 gauge core needle device was utilized to obtain to obtain 3 core needle biopsies of the indeterminate left supraclavicular lymph node. The samples were placed in saline and submitted to pathology. The needle was removed and hemostasis was achieved with manual compression. Post procedure scan was negative for significant hematoma. A dressing was placed. The patient tolerated the procedure well without immediate postprocedural complication. IMPRESSION: Technically successful ultrasound guided biopsy of dominant left supraclavicular lymph node. Electronically Signed   By: Sandi Mariscal M.D.   On: 03/27/2019 16:19       Subjective: Patient seen and examined at the bedside.  No overnight events.  A lot of questions about whether to go to skilled nursing rehab or to go home.  Wife at the bedside, wanting to go to a skilled nursing rehab to get his strength and improve balance.   Discharge Exam: Vitals:   04/02/19 0735 04/02/19 0908  BP: 133/72   Pulse: (!) 57 (!) 57  Resp: 18   Temp: 98.2 F (36.8 C)   SpO2: 97%    Vitals:   04/01/19 2332 04/02/19 0404 04/02/19 0735 04/02/19 0908  BP: 109/64 120/60 133/72   Pulse: (!) 55 (!) 57 (!) 57 (!) 57  Resp: 17 17 18    Temp: (!) 97.5 F (36.4 C) 97.6 F (36.4 C) 98.2 F (36.8 C)   TempSrc: Oral Oral Oral   SpO2: 95% 95% 97%     General: Pt is alert, awake, not in acute distress, sitting in couch.  Eating breakfast.  On room air. Cardiovascular: RRR, S1/S2 +, no rubs, no gallops Respiratory: CTA bilaterally, no wheezing, no rhonchi Abdominal: Soft, NT, ND, bowel sounds + Extremities: no edema, no cyanosis    The  results of significant diagnostics from this hospitalization (including imaging, microbiology, ancillary and laboratory) are listed below for reference.     Microbiology: Recent Results (from the past 240 hour(s))  Novel Coronavirus, NAA ([facility])     Status: None   Collection Time: 03/25/19 12:00 AM   Specimen: Oropharyngeal(OP) collection in vial transport medium   OROPHARYNGEA  TESTING  Result Value Ref Range Status   SARS-CoV-2, NAA Not Detected Not Detected Final    Comment: This nucleic acid amplification test was developed and its perfomance characteristics determined by [employer]. Nucleic acid amplification tests include PCR and TMA. This test has not been FDA cleared or approved. This test has been authorized by FDA under an Emergency Use Authorization (EUA). This test is only authorized for the duration of time the declaration that circumstances exist justifying the authorization of the emergency use of in vitro diagnostic tests for detection of SARS-CoV-2 virus and/or diagnosis of COVID-19 infection under section 564(b)(1) of the Act, 21 U.S.C. [id number]) (1), unless the authorization is terminated or revoked sooner. When diagnostic testing is negative, the possibility of a false negative result should be considered in the context of a patient's recent exposures and the presence of clinical signs and symptoms consistent with COVID-19. An individual without symptoms of COVID-19 and who is not shedding SARS-CoV-2 virus would  expect to have a negative (not detected) result in this assay.   SARS CORONAVIRUS 2 (TAT 6-24 HRS) Nasopharyngeal Nasopharyngeal Swab     Status: None   Collection Time: 03/25/19  7:41 PM   Specimen: Nasopharyngeal Swab  Result Value Ref Range Status   SARS Coronavirus 2 NEGATIVE NEGATIVE Final    Comment: (NOTE) SARS-CoV-2 target nucleic acids are NOT DETECTED. The SARS-CoV-2 RNA is generally detectable in upper and lower respiratory  specimens during the acute phase of infection. Negative results do not preclude SARS-CoV-2 infection, do not rule out co-infections with other pathogens, and should not be used as the sole basis for treatment or other patient management decisions. Negative results must be combined with clinical observations, patient history, and epidemiological information. The expected result is Negative. Fact Sheet for Patients: SugarRoll.be Fact Sheet for Healthcare Providers: https://www.woods-mathews.com/ This test is not yet approved or cleared by the [place] FDA and  has been authorized for detection and/or diagnosis of SARS-CoV-2 by FDA under an Emergency Use Authorization (EUA). This EUA will remain  in effect (meaning this test can be used) for the duration of the COVID-19 declaration under Section 56 4(b)(1) of the Act, 21 U.S.C. section 360bbb-3(b)(1), unless the authorization is terminated or revoked sooner. Performed at [facility], [place] [address], [place], [place] [address]   Culture, blood (routine x 2)     Status: None   Collection Time: 03/25/19  9:10 PM   Specimen: BLOOD RIGHT HAND  Result Value Ref Range Status   Specimen Description BLOOD RIGHT HAND  Final   Special Requests   Final    BOTTLES DRAWN AEROBIC AND ANAEROBIC Blood Culture results may not be optimal due to an inadequate volume of blood received in culture bottles   Culture   Final    NO GROWTH 5 DAYS Performed at [facility], [place] [address] [place], [place] [address]    Report Status 03/30/2019 FINAL  Final  Culture, blood (routine x 2)     Status: None   Collection Time: 03/26/19  9:13 AM   Specimen: BLOOD LEFT HAND  Result Value Ref Range Status   Specimen Description BLOOD LEFT HAND  Final   Special Requests   Final    BOTTLES DRAWN AEROBIC ONLY Blood Culture adequate volume   Culture   Final    NO GROWTH 5 DAYS Performed at [facility], [place] [address], [place], [place] [address]    Report Status  03/31/2019 FINAL  Final     Labs: BNP (last 3 results) No results for input(s): BNP in the last 8760 hours. Basic Metabolic Panel: Recent Labs  Lab 03/27/19 0825  NA 139  K 4.1  CL 103  CO2 22  GLUCOSE 170*  BUN 25*  CREATININE 1.18  CALCIUM 9.7  MG 2.0  PHOS 3.7   Liver Function Tests: No results for input(s): AST, ALT, ALKPHOS, BILITOT, PROT, ALBUMIN in the last 168 hours. No results for input(s): LIPASE, AMYLASE in the last 168 hours. No results for input(s): AMMONIA in the last 168 hours. CBC: Recent Labs  Lab 03/27/19 0825  WBC 8.4  NEUTROABS 6.8  HGB 14.5  HCT 40.8  MCV 94.0  PLT 219   Cardiac Enzymes: No results for input(s): CKTOTAL, CKMB, CKMBINDEX, TROPONINI in the last 168 hours. BNP: Invalid input(s): POCBNP CBG: No results for input(s): GLUCAP in the last 168 hours. D-Dimer No results for input(s): DDIMER in the last 72 hours. Hgb A1c No results for input(s): HGBA1C in the last 72 hours. Lipid Profile No results for input(s): CHOL, HDL, LDLCALC, TRIG, CHOLHDL, LDLDIRECT in the last 72 hours. Thyroid function studies No results for input(s): TSH, T4TOTAL, T3FREE, THYROIDAB in the last 72 hours.  Invalid input(s): FREET3 Anemia work up No results for input(s): VITAMINB12, FOLATE, FERRITIN, TIBC, IRON, RETICCTPCT in the last 72 hours. Urinalysis    Component Value Date/Time   COLORURINE YELLOW 06/13/2011 [place] 06/13/2011 1201   LABSPEC 1.010 06/13/2011 1201   PHURINE 6.5 06/13/2011 1201   GLUCOSEU NEGATIVE 06/13/2011 1201   HGBUR NEGATIVE 06/13/2011 1201   BILIRUBINUR n 07/20/2015 0931   KETONESUR NEGATIVE 06/13/2011 1201   PROTEINUR n 07/20/2015 0931   PROTEINUR NEGATIVE 06/13/2011 1201   UROBILINOGEN 0.2 07/20/2015 0931   UROBILINOGEN 1.0 06/13/2011 1201   NITRITE n 07/20/2015 0931   NITRITE NEGATIVE 06/13/2011 1201   LEUKOCYTESUR Negative 07/20/2015  0931   Sepsis Labs Invalid input(s): PROCALCITONIN,  WBC,  LACTICIDVEN Microbiology Recent Results (from the past 240 hour(s))  Novel Coronavirus, NAA ([facility])     Status: None   Collection Time: 03/25/19 12:00 AM   Specimen: Oropharyngeal(OP) collection in vial transport medium   OROPHARYNGEA  TESTING  Result Value Ref Range Status   SARS-CoV-2, NAA Not Detected Not Detected Final    Comment: This nucleic acid amplification test was developed and its perfomance characteristics determined by [employer]. Nucleic acid amplification tests include PCR and TMA. This test has not been FDA cleared or approved. This test has been authorized by FDA under an Emergency Use Authorization (EUA). This test is only authorized for the duration of time the declaration that circumstances exist justifying the authorization of the emergency use of in vitro diagnostic tests for detection of SARS-CoV-2 virus and/or diagnosis of COVID-19 infection under section 564(b)(1) of the Act, 21 U.S.C. [id number]) (1), unless the authorization is terminated or revoked sooner. When diagnostic testing is negative, the possibility of a false negative result should be considered in the context of a patient's recent exposures and the presence of clinical signs and symptoms consistent with COVID-19. An individual without symptoms of COVID-19 and who is not shedding SARS-CoV-2 virus would  expect to have a negative (not detected) result in this assay.   SARS CORONAVIRUS 2 (TAT 6-24 HRS) Nasopharyngeal Nasopharyngeal Swab     Status: None   Collection Time: 03/25/19  7:41 PM   Specimen: Nasopharyngeal Swab  Result Value Ref  Range Status   SARS Coronavirus 2 NEGATIVE NEGATIVE Final    Comment: (NOTE) SARS-CoV-2 target nucleic acids are NOT DETECTED. The SARS-CoV-2 RNA is generally detectable in upper and lower respiratory specimens during the acute phase of infection. Negative results do not preclude  SARS-CoV-2 infection, do not rule out co-infections with other pathogens, and should not be used as the sole basis for treatment or other patient management decisions. Negative results must be combined with clinical observations, patient history, and epidemiological information. The expected result is Negative. Fact Sheet for Patients: SugarRoll.be Fact Sheet for Healthcare Providers: https://www.woods-mathews.com/ This test is not yet approved or cleared by the [place] FDA and  has been authorized for detection and/or diagnosis of SARS-CoV-2 by FDA under an Emergency Use Authorization (EUA). This EUA will remain  in effect (meaning this test can be used) for the duration of the COVID-19 declaration under Section 56 4(b)(1) of the Act, 21 U.S.C. section 360bbb-3(b)(1), unless the authorization is terminated or revoked sooner. Performed at [facility], [place] [address] [place], [place] [address]   Culture, blood (routine x 2)     Status: None   Collection Time: 03/25/19  9:10 PM   Specimen: BLOOD RIGHT HAND  Result Value Ref Range Status   Specimen Description BLOOD RIGHT HAND  Final   Special Requests   Final    BOTTLES DRAWN AEROBIC AND ANAEROBIC Blood Culture results may not be optimal due to an inadequate volume of blood received in culture bottles   Culture   Final    NO GROWTH 5 DAYS Performed at [facility], [place] [address], [place], [place] [address]    Report Status 03/30/2019 FINAL  Final  Culture, blood (routine x 2)     Status: None   Collection Time: 03/26/19  9:13 AM   Specimen: BLOOD LEFT HAND  Result Value Ref Range Status   Specimen Description BLOOD LEFT HAND  Final   Special Requests   Final    BOTTLES DRAWN AEROBIC ONLY Blood Culture adequate volume   Culture   Final    NO GROWTH 5 DAYS Performed at [facility], [place] [address], [place], [place] [address]    Report Status 03/31/2019 FINAL   Final     Time coordinating discharge:  35 minutes  SIGNED:   Barb Merino, MD  [facility] 04/02/2019, 11:24 AM   Addendum, 04/03/2019 10 AM: Patient seen and examined.  Appropriate for discharge with no change in above discharge summary and plan of care.

## 2019-04-02 NOTE — Progress Notes (Signed)
Spoke with pathologist, lymph node c/w metastatic melanoma Please call if we can be of further help.  Erskine Emery MD PCCM

## 2019-04-03 ENCOUNTER — Other Ambulatory Visit: Payer: Self-pay | Admitting: Internal Medicine

## 2019-04-03 MED ORDER — INFLUENZA VAC A&B SA ADJ QUAD 0.5 ML IM PRSY
0.5000 mL | PREFILLED_SYRINGE | INTRAMUSCULAR | Status: AC
  Administered 2019-04-04: 0.5 mL via INTRAMUSCULAR
  Filled 2019-04-03: qty 0.5

## 2019-04-03 NOTE — Plan of Care (Signed)
Patient stated that he needs to take all medications as prescribed and eat healthier meals.

## 2019-04-03 NOTE — Progress Notes (Signed)
Patient seen and examined.  He could not be transferred to a skilled nursing facility on 04/02/2019 because of unavailability of insurance prior authorization.  I have reviewed his documentation, discharge summary and plan of care and that remains unchanged.  He can be transferred to a skilled nursing facility when available.

## 2019-04-04 ENCOUNTER — Other Ambulatory Visit: Payer: Self-pay | Admitting: Radiation Therapy

## 2019-04-04 MED ORDER — STROKE: EARLY STAGES OF RECOVERY BOOK
Freq: Once | Status: AC
  Administered 2019-04-04: 20:00:00
  Filled 2019-04-04: qty 1

## 2019-04-04 NOTE — Progress Notes (Signed)
Physical Therapy Treatment Patient Details Name: Randall Mcgee MRN: [id number] DOB: Sep 05, 1940 Today's Date: 04/04/2019    History of Present Illness Randall Mcgee  is a 78 y.o. male, w hypertension, hyperlipidemia, anxiety, remote history of melanoma on scalp 2017, admitted with gait instability since Sunday am prior to admission.  Also with a severe headache and memory issues.  Pt/wife report pt was in his usual state of health and [place] prior to Sunday.  Imaging shows multiple scattered intracranial hemorrhagic metastases thought to originate in the lungs.    PT Comments    Patient received in bed, wife present and very frustrated about hospital stay, they have been waiting for insurance approval for 3 days to go to rehab. Wife is very concerned about bringing him home.  Had a very lengthy discussion with patient and wife about discharge and my recommendations at this point.  Patient requires no physical assist for bed mobility, transfers with supervision and is ambulating > 400 feet in hallway with RW and supervision. He has no LOB or difficulty ambulating this distance, talking and walking the whole session. Patient will benefit from HHPT to assess home environment and safety at home and to continue working on strength and balance.        Follow Up Recommendations  Home health PT     Equipment Recommendations  Rolling walker with 5" wheels    Recommendations for Other Services       Precautions / Restrictions Precautions Precautions: Fall Precaution Comments: moderate fall Restrictions Weight Bearing Restrictions: No    Mobility  Bed Mobility Overal bed mobility: Modified Independent             General bed mobility comments: use of bed rails  Transfers Overall transfer level: Needs assistance Equipment used: Rolling walker (2 wheeled) Transfers: Sit to/from Stand Sit to Stand: Supervision            Ambulation/Gait Ambulation/Gait assistance:  Supervision;Min guard Gait Distance (Feet): 400 Feet Assistive device: Rolling walker (2 wheeled) Gait Pattern/deviations: Step-through pattern     General Gait Details: Patient demonstrates safe, steady ambulation with rolling walker   Stairs             Wheelchair Mobility    Modified Rankin (Stroke Patients Only)       Balance Overall balance assessment: Modified Independent Sitting-balance support: Feet supported;No upper extremity supported Sitting balance-Leahy Scale: Good     Standing balance support: Bilateral upper extremity supported Standing balance-Leahy Scale: Good                              Cognition Arousal/Alertness: Awake/alert Behavior During Therapy: WFL for tasks assessed/performed Overall Cognitive Status: Within Functional Limits for tasks assessed                                        Exercises      General Comments        Pertinent Vitals/Pain Pain Assessment: No/denies pain    Home Living                      Prior Function            PT Goals (current goals can now be found in the care plan section) Acute Rehab PT Goals Patient Stated Goal: to figure out what the  next step is. He and wife report they have been waiting for insurance approval for 3 days. PT Goal Formulation: With patient/family Time For Goal Achievement: 04/11/19 Potential to Achieve Goals: Good Progress towards PT goals: Progressing toward goals    Frequency    Min 3X/week      PT Plan Discharge plan needs to be updated    Co-evaluation              AM-PAC PT "6 Clicks" Mobility   Outcome Measure  Help needed turning from your back to your side while in a flat bed without using bedrails?: None Help needed moving from lying on your back to sitting on the side of a flat bed without using bedrails?: None Help needed moving to and from a bed to a chair (including a wheelchair)?: A Little Help needed  standing up from a chair using your arms (e.g., wheelchair or bedside chair)?: A Little Help needed to walk in hospital room?: A Little Help needed climbing 3-5 steps with a railing? : A Little 6 Click Score: 20    End of Session Equipment Utilized During Treatment: Gait belt Activity Tolerance: Patient tolerated treatment well Patient left: in bed;with bed alarm set;with family/visitor present;with call bell/phone within reach Nurse Communication: Mobility status PT Visit Diagnosis: Unsteadiness on feet (R26.81);Other symptoms and signs involving the nervous system (R29.898);Other abnormalities of gait and mobility (R26.89)     Time: 1435-1511 PT Time Calculation (min) (ACUTE ONLY): 36 min  Charges:  $Gait Training: 23-37 mins                     [employer], PT, [facility] 04/04/19,3:26 PM

## 2019-04-04 NOTE — Progress Notes (Signed)
PROGRESS NOTE  Randall Mcgee  DOB: 11-13-1940  PCP: Isaac Bliss, Rayford Halsted, MD [id number]  DOA: 03/25/2019  LOS: 10 days   Brief narrative: 78 year old gentleman with history of hypertension, hyperlipidemia, anxiety and remote history of scalp melanoma status post excision in 2017 came to the emergency room with about 1 to 2 weeks of stumbling and losing balance, not feeling well with low-grade fever since last few days, loss of concentration and memory lack.  Hemodynamically stable in the ER.  CT head showed multiple areas of acute hemorrhage with right posterior occipital lobe mass.  COVID-19 test was negative.  Chest x-ray was normal.  Admitted with metastatic brain cancer and hemorrhagic brain lesion. 03/26/2019: Overnight became paranoid, difficult to control behavior.  Improved now.  03/27/2019: Underwent percutaneous biopsy left supraclavicular gland, result consistent with metastatic melanoma.  Subjective: Patient was seen and examined this morning.  Pleasant elderly Caucasian male.  Lying down in bed.  Not in distress.  Waiting for enzymes outpatient for SNF placement.  Assessment/Plan:  Intracerebral hemorrhage with metastatic melanoma, metastasis to lungs and brain with hemorrhage and vasogenic edema: Neurologically stable now. Repeat CT head was without worsening bleeding. Avoid all NSAIDs and aspirin. Treated with dexamethasone IV.Currently on oral dexamethasone.  Continue dexamethasone. Followed by radiation oncology and hematology oncology. Patient needing short-term rehab, will transfer to skilled nursing rehab in the meantime is planned for treatment.  Essential hypertension: Blood pressures better now. On his lisinopril and propanolol as well as added dose of amlodipine. Continue lisinopril, propranolol and amlodipine.  Hyperlipidemia:On statin at home.  We will continue to hold in view of intracranial hemorrhage.  Abnormal blood sugar:A1c 6.1. No  indication for treatment. Anticipate elevated blood sugars with steroid treatment, will hold off on initiating any treatment.   Patient had developed some paranoid behavior on initial hospitalization, however now he did very well.  He has no more paranoia.  Patient is stable to transfer to skilled level of care pending further treatment plans.   Body mass index is 28.96 kg/m. Mobility: PT eval obtained Diet: Regular diet DVT prophylaxis:  SCDs Code Status:   Code Status: Prior  Family Communication:  Expected Discharge:  Pending insurance approval for SNF.  Consultants:    Procedures:    Antimicrobials: Anti-infectives (From admission, onward)   None      Infusions:    Scheduled Meds: . ALPRAZolam  0.5 mg Oral QHS  . amLODipine  5 mg Oral Daily  . dexamethasone  4 mg Oral Q8H  . lisinopril  40 mg Oral Daily  . loratadine  10 mg Oral Daily  . propranolol  40 mg Oral BID  . sodium chloride flush  3 mL Intravenous Once    PRN meds: acetaminophen, ALPRAZolam   Objective: Vitals:   04/04/19 0911 04/04/19 1213  BP: 127/66 109/66  Pulse: 62 (!) 53  Resp:  16  Temp:  97.8 F (36.6 C)  SpO2:  96%    Intake/Output Summary (Last 24 hours) at 04/04/2019 1418 Last data filed at 04/03/2019 1500 Gross per 24 hour  Intake 340 ml  Output -  Net 340 ml   Filed Weights   04/02/19 2100  Weight: 94.2 kg   Weight change:  Body mass index is 28.96 kg/m.   Physical Exam: General exam: Appears calm and comfortable.  Skin: No rashes, lesions or ulcers. HEENT: Atraumatic, normocephalic, supple neck, no obvious bleeding Lungs: Clear to auscultation bilaterally CVS: Regular rate and rhythm, no murmur  GI/Abd soft, nontender, nondistended, bowel sound. CNS: Alert, awake and oriented x3 Psychiatry: Mood appropriate, judgment and insight intact Extremities: No pedal edema, no calf tenderness  Data Review: I have personally reviewed the laboratory data and studies  available.  No results for input(s): WBC, NEUTROABS, HGB, HCT, MCV, PLT in the last 168 hours.  No results for input(s): NA, K, CL, CO2, GLUCOSE, BUN, CREATININE, CALCIUM, MG, PHOS in the last 168 hours.  Terrilee Croak, MD  [facility] 04/04/2019

## 2019-04-05 LAB — CBC WITH DIFFERENTIAL/PLATELET
Abs Immature Granulocytes: 0.18 10*3/uL — ABNORMAL HIGH (ref 0.00–0.07)
Basophils Absolute: 0 10*3/uL (ref 0.0–0.1)
Basophils Relative: 0 %
Eosinophils Absolute: 0 10*3/uL (ref 0.0–0.5)
Eosinophils Relative: 0 %
HCT: 39.9 % (ref 39.0–52.0)
Hemoglobin: 13.7 g/dL (ref 13.0–17.0)
Immature Granulocytes: 1 %
Lymphocytes Relative: 9 %
Lymphs Abs: 1.2 10*3/uL (ref 0.7–4.0)
MCH: 33.4 pg (ref 26.0–34.0)
MCHC: 34.3 g/dL (ref 30.0–36.0)
MCV: 97.3 fL (ref 80.0–100.0)
Monocytes Absolute: 0.6 10*3/uL (ref 0.1–1.0)
Monocytes Relative: 5 %
Neutro Abs: 11.8 10*3/uL — ABNORMAL HIGH (ref 1.7–7.7)
Neutrophils Relative %: 85 %
Platelets: 183 10*3/uL (ref 150–400)
RBC: 4.1 MIL/uL — ABNORMAL LOW (ref 4.22–5.81)
RDW: 12.2 % (ref 11.5–15.5)
WBC: 13.8 10*3/uL — ABNORMAL HIGH (ref 4.0–10.5)
nRBC: 0 % (ref 0.0–0.2)

## 2019-04-05 LAB — BASIC METABOLIC PANEL
Anion gap: 8 (ref 5–15)
BUN: 32 mg/dL — ABNORMAL HIGH (ref 8–23)
CO2: 23 mmol/L (ref 22–32)
Calcium: 8.6 mg/dL — ABNORMAL LOW (ref 8.9–10.3)
Chloride: 104 mmol/L (ref 98–111)
Creatinine, Ser: 0.96 mg/dL (ref 0.61–1.24)
GFR calc Af Amer: >60 mL/min (ref >60)
GFR calc non Af Amer: >60 mL/min (ref >60)
Glucose, Bld: 125 mg/dL — ABNORMAL HIGH (ref 70–99)
Potassium: 4.6 mmol/L (ref 3.5–5.1)
Sodium: 135 mmol/L (ref 135–145)

## 2019-04-05 MED ORDER — AMLODIPINE BESYLATE 5 MG PO TABS: 5.0000 mg | ORAL_TABLET | Freq: Every day | ORAL | 0 refills | Status: AC

## 2019-04-05 MED ORDER — DEXAMETHASONE 4 MG PO TABS: 4.0000 mg | ORAL_TABLET | Freq: Three times a day (TID) | ORAL | 0 refills | Status: DC

## 2019-04-05 NOTE — Discharge Summary (Signed)
Physician Discharge Summary  Randall Mcgee [id number] DOB: Jan 04, 1941 DOA: 03/25/2019  PCP: Isaac Bliss, Rayford Halsted, MD  Admit date: 03/25/2019 Discharge date: 04/05/2019  Admitted From: Home Discharge disposition: Home with home health   Code Status: Prior   Recommendations for Outpatient Follow-Up:   1. Follow-up with oncology as an outpatient  Discharge Diagnosis:   Principal Problem:   ICH (intracerebral hemorrhage) ([place]) Active Problems:   Hypertension   Hypercholesterolemia   Impaired glucose tolerance    History of Present Illness / Brief narrative:  78 year old gentleman with history of hypertension, hyperlipidemia, anxiety and remote history of scalp melanoma status post excision in 2017 came to the emergency room with about 1 to 2 weeks of stumbling and losing balance, not feeling well with low-grade fever since last few days, loss of concentration and memory lack. Hemodynamically stable in the ER.  CT head showed multiple areas of acute hemorrhage with right posterior occipital lobe mass. COVID-19 test was negative. Chest x-ray was normal. Admitted with metastatic brain cancer and hemorrhagic brain lesion. 03/27/2019: Underwent percutaneous biopsy left supraclavicular gland, result consistent with metastatic melanoma.  Subjective:  Seen and examined this morning.  Pleasant elderly Caucasian male.  Sitting up in chair.  Not in distress.  He is frustrated because of the delay of insurance authorization for SNF.  He and his wife have changed their mind.  Patient wants to go home with home health.  Hospital Course:  Intracerebral hemorrhagewithmetastatic melanoma, metastasis to lungs and brain with hemorrhage and vasogenic edema: Neurologically stable now. Repeat CT head was without worsening bleeding. Avoid all NSAIDs and aspirin. Strict blood pressure control. Treated with dexamethasone IV.Currently on oral dexamethasone which we will continue at the same  dose at discharge.  Patient will follow-up with oncology as an outpatient for tapering.   Followed by radiation oncology and hematology oncology. Home with home health  Essentialhypertension:  Patient reports frequent source of blood pressure to higher level at home despite being on 2 prescribed medicine propanolol and lisinopril.  In the hospital, amlodipine was added as well.  With 3 medicines, patient's blood pressure is better controlled now.  Continue same at home.    Hyperlipidemia:On statin at home. Per literature, statin may increase the risk of recurrence of ICH on patientwith history of ICH.  Will continue to hold statin for now.  Abnormal blood sugar:A1c 6.1. No indication for treatment. Anticipate elevated blood sugars with steroid treatment, will hold off on initiating any treatment.   Stable for discharge home today.  Discharge Exam:   Vitals:   04/04/19 1957 04/04/19 2351 04/05/19 0417 04/05/19 0823  BP: 122/69 (!) 102/56 105/62 118/64  Pulse: 62 (!) 50 (!) 50 (!) 51  Resp: 17 16 16 17   Temp: 97.7 F (36.5 C) 98.5 F (36.9 C) 97.8 F (36.6 C) 98.6 F (37 C)  TempSrc: Oral Oral Oral Oral  SpO2: 94% 96% 96% 96%  Weight:      Height:        Body mass index is 28.96 kg/m.  General exam: Appears calm and comfortable.  Sitting up in chair.  Not in distress Skin: No rashes, lesions or ulcers. HEENT: Atraumatic, normocephalic, supple neck, no obvious bleeding Lungs: Clear to auscultation bilaterally CVS: Regular rate and rhythm, no murmur GI/Abd soft, nontender, nondistended, bowel sound present CNS: Alert, awake, oriented x3 Psychiatry: Mood appropriate, judgment and insight clear Extremities: No pedal edema, no calf tenderness  Discharge Instructions:  Wound care: None Discharge  Instructions    Diet - low sodium heart healthy   Complete by: As directed    Increase activity slowly   Complete by: As directed    Increase activity slowly   Complete  by: As directed    Increase activity slowly   Complete by: As directed      Follow-up Information    [facility] Follow up.   Specialty: Home Health Services Contact information: [place] [address] [phone]          Allergies as of 04/05/2019   No Known Allergies     Medication List    STOP taking these medications   aspirin 81 MG tablet   fluorouracil 5 % cream Commonly known as: EFUDEX   ibuprofen 200 MG tablet Commonly known as: ADVIL   indomethacin 25 MG capsule Commonly known as: INDOCIN   sildenafil 20 MG tablet Commonly known as: REVATIO   simvastatin 40 MG tablet Commonly known as: ZOCOR     TAKE these medications   acetaminophen 325 MG tablet Commonly known as: TYLENOL Take 2 tablets (650 mg total) by mouth every 6 (six) hours as needed for mild pain or headache.   ALPRAZolam 0.5 MG tablet Commonly known as: XANAX Take 1 tablet (0.5 mg total) by mouth 2 (two) times daily as needed for up to 5 days for anxiety or sleep. Take 0.5 mg by mouth at bedtime and an additional 0.5 mg once a day as needed for anxiety What changed: See the new instructions.   amLODipine 5 MG tablet Commonly known as: NORVASC Take 1 tablet (5 mg total) by mouth daily.   CENTRUM SILVER PO Take 1 tablet by mouth daily.   cetirizine 10 MG tablet Commonly known as: ZYRTEC Take 1 tablet (10 mg total) by mouth daily. What changed: when to take this   dexamethasone 4 MG tablet Commonly known as: DECADRON Take 1 tablet (4 mg total) by mouth every 8 (eight) hours for 14 days.   lisinopril 40 MG tablet Commonly known as: ZESTRIL Take 1 tablet (40 mg total) by mouth daily.   propranolol 20 MG tablet Commonly known as: INDERAL Take 40 mg by mouth 2 (two) times daily. What changed: Another medication with the same name was removed. Continue taking this medication, and follow the directions you see here.   vitamin B-12 1000 MCG  tablet Commonly known as: CYANOCOBALAMIN Take 1 tablet (1,000 mcg total) by mouth daily. What changed:   medication strength  how much to take            Durable Medical Equipment  (From admission, onward)         Start     Ordered   04/04/19 1615  For home use only DME Walker rolling  [facility])  Once    Question:  Patient needs a walker to treat with the following condition  Answer:  Loss of balance   04/04/19 1614          Time coordinating discharge: 35 minutes  The results of significant diagnostics from this hospitalization (including imaging, microbiology, ancillary and laboratory) are listed below for reference.    Procedures and Diagnostic Studies:   Ct Head Wo Contrast  Result Date: 03/27/2019 CLINICAL DATA:  78-year-old male with follow-up intracranial hemorrhagic metastasis. EXAM: CT HEAD WITHOUT CONTRAST TECHNIQUE: Contiguous axial images were obtained from the base of the skull through the vertex without intravenous contrast. COMPARISON:  Head CT dated  03/25/2019 FINDINGS: Brain: No significant interval change in the size of the right occipital hemorrhagic mass measuring approximately 5 x 3 cm in axial dimensions. Additional smaller hemorrhagic lesions involving the left occipital lobe, and left temporal lobe appear unchanged. There is associated edema with mild mass effect 5 on the surrounding tissue. No new hemorrhage identified. No midline shift. There is mild age-related atrophy and chronic microvascular ischemic changes. Vascular: No hyperdense vessel or unexpected calcification. Skull: Normal. Negative for fracture or focal lesion. Sinuses/Orbits: Mild mucoperiosteal thickening paranasal sinuses. No air-fluid level. The mastoid air cells are clear. Other: None IMPRESSION: No significant interval change in the size of the known hemorrhagic metastases or associated edema. No new hemorrhage or midline shift. Continued follow-up recommended. Electronically Signed    By: Anner Crete M.D.   On: 03/27/2019 13:30   Ct Chest W Contrast  Result Date: 03/26/2019 CLINICAL DATA:  Liver/biliary neoplasm. EXAM: CT CHEST, ABDOMEN, AND PELVIS WITH CONTRAST TECHNIQUE: Multidetector CT imaging of the chest, abdomen and pelvis was performed following the standard protocol during bolus administration of intravenous contrast. CONTRAST:  130mL ISOVUE-300 IOPAMIDOL (ISOVUE-300) INJECTION 61% COMPARISON:  Abdominal ultrasound dated 03/25/2019 FINDINGS: CT CHEST FINDINGS Cardiovascular: Normal heart size. No pericardial effusion. Calcific atherosclerotic disease of the coronary arteries and to mild degree the aorta. Mediastinum/Nodes: Marked bilateral mediastinal lymphadenopathy with abnormal lymph nodes in left paratracheal, left prevascular, left precarinal, central subcarinal stations, as well as right hilar and peribronchial stations. Additional abnormal lymph node in left supra clavicular location. The largest conglomerate of abnormal appearing lymph nodes in the subcarinal region measures 2.9 x 5.4 cm. The trachea is patent. Lungs/Pleura: There is a large subpleural/peri diaphragmatic soft tissue mass in the right lower lobe measuring 3.4 by 4.1 by 3.5 cm. Tiny 2-3 mm perifissural soft tissue nodule is seen in the right middle lobe, image 103/142, sequence 4. A second soft tissue nodule in the more inferior right middle lobe measures 3 mm, image 109/142, sequence 4. There are areas of linear atelectasis in the bilateral lung bases and lingula. Musculoskeletal: No chest wall mass or suspicious bone lesions identified. CT ABDOMEN PELVIS FINDINGS Hepatobiliary: No focal liver abnormality is seen. Status post cholecystectomy. No biliary dilatation. Pancreas: Unremarkable. No pancreatic ductal dilatation or surrounding inflammatory changes. Spleen: Normal in size without focal abnormality. Adrenals/Urinary Tract: 1.4 cm indeterminate right adrenal mass. The left adrenal gland is normal. The  kidneys are normal. No left renal masses appreciated. No hydronephrosis or nephrolithiasis. There is a right peritrigonal bladder diverticulum. Stomach/Bowel: Stomach is within normal limits. Appendix appears normal. No evidence of bowel wall thickening, distention, or inflammatory changes. Left colonic diverticulosis without evidence of diverticulitis. Vascular/Lymphatic: Aortic atherosclerosis. No enlarged abdominal or pelvic lymph nodes. Reproductive: Enlarged prostate gland. Other: Bilateral fat containing inguinal hernias. Musculoskeletal: No acute osseous findings. Spondylosis of the spine. IMPRESSION: 1. 3.4 cm right lower lobe subpleural/peri diaphragmatic soft tissue pulmonary mass, highly suspicious for primary pulmonary malignancy or metastatic lesion. 2. Marked bilateral mediastinal and right hilar/peribronchial lymphadenopathy. 3. The mid esophagus is compressed or contiguous with the left paratracheal lymphadenopathy/mass. Esophageal involvement cannot be excluded. 4. 1.4 cm indeterminate right adrenal mass. 5. Left colonic diverticulosis without evidence of diverticulitis. 6. Bilateral fat containing inguinal hernias. 7. Enlarged prostate gland. Please correlate to serum PSA values. Aortic Atherosclerosis (ICD10-I70.0). These results will be called to the ordering clinician or representative by the Radiologist Assistant, and communication documented in the PACS or zVision Dashboard. Electronically Signed   By: Thomas Hoff  Dimitrova M.D.   On: 03/26/2019 14:00   Mr Brain W And Wo Contrast  Result Date: 03/26/2019 CLINICAL DATA:  Follow-up examination for acute intracranial hemorrhage. Altered mental status. EXAM: MRI HEAD WITHOUT AND WITH CONTRAST TECHNIQUE: Multiplanar, multiecho pulse sequences of the brain and surrounding structures were obtained without and with intravenous contrast. CONTRAST:  10 cc of Gadavist. COMPARISON:  Prior CT from 03/25/2019. FINDINGS: Brain: Mild age-related cerebral  atrophy with chronic microvascular ischemic disease. Few scattered superimposed remote lacunar infarcts noted within the right basal ganglia and left thalamus. Previously identified intraparenchymal hematoma centered at the right parieto-occipital region again seen, measuring approximately 4.1 x 4.3 x 3.9 cm (estimated volume 34 cc). Underlying metastatic lesion is presumably present. Surrounding vasogenic edema throughout the right parieto-occipital region with mild mass effect on the adjacent atrium of the right lateral ventricle. No definite intraventricular extension of hemorrhage. Associated trace right-to-left midline shift of the septum pellucidum. Basilar cisterns remain patent. Multiple additional scattered enhancing masses seen involving the bilateral cerebral hemispheres, all of which demonstrate associated susceptibility artifact, consistent with hemorrhagic metastases. These are seen as follows; punctate enhancing lesion at the posterior left centrum semi ovale (series 18, image 45), 8 mm enhancing lesion at the right parietal cortex (series 18, image 43), 5 mm lesion involving the cortical gray matter of the anterior left frontal lobe (series 18, image 42), 7 mm lesion along the cortex of the anterior right frontal lobe (series 18, image 34), 11 mm dural-based lesion at the right occipital lobe (series 18, image 28), adjacent punctate 4 mm lesion (series 18, image 25), 14 mm lesion at the anterior left temporal lobe (series 18, image 25), 20 x 19 mm dural-based lesion at the left occipital lobe (series 18, image 23). Additional possible punctate lesion at the anterior temporal left temporal pole (series 18, image 18), not entirely certain given small size. Relative sparing of the brainstem and cerebellum. No evidence for acute infarct. No extra-axial fluid collection. No hydrocephalus or ventricular trapping. No other acute intracranial abnormality. Vascular: Major intracranial vascular flow voids are  maintained. Skull and upper cervical spine: Craniocervical junction within normal limits. Degenerative spondylolysis noted at C2-3 without significant stenosis. No focal marrow replacing lesion. Postsurgical changes noted at the left frontal scalp. Sinuses/Orbits: Patient status post bilateral ocular lens replacement. Globes and orbital soft tissues demonstrate no acute finding. Scattered mucosal thickening noted within the ethmoidal air cells and maxillary sinuses with superimposed small maxillary sinus retention cyst. No mastoid effusion. Inner ear structures grossly normal. Other: None. IMPRESSION: 1. Multiple scattered intracranial hemorrhagic metastases as detailed above. One of these lesions at the parieto-occipital region has bled with associated 34 cc intraparenchymal hematoma. Associated regional mass effect with trace right-to-left midline shift. 2. Underlying age-related cerebral atrophy with mild chronic small vessel ischemic disease. Electronically Signed   By: Jeannine Boga M.D.   On: 03/26/2019 03:13   Ct Abdomen Pelvis W Contrast  Result Date: 03/26/2019 CLINICAL DATA:  Liver/biliary neoplasm. EXAM: CT CHEST, ABDOMEN, AND PELVIS WITH CONTRAST TECHNIQUE: Multidetector CT imaging of the chest, abdomen and pelvis was performed following the standard protocol during bolus administration of intravenous contrast. CONTRAST:  15mL ISOVUE-300 IOPAMIDOL (ISOVUE-300) INJECTION 61% COMPARISON:  Abdominal ultrasound dated 03/25/2019 FINDINGS: CT CHEST FINDINGS Cardiovascular: Normal heart size. No pericardial effusion. Calcific atherosclerotic disease of the coronary arteries and to mild degree the aorta. Mediastinum/Nodes: Marked bilateral mediastinal lymphadenopathy with abnormal lymph nodes in left paratracheal, left prevascular, left precarinal, central subcarinal stations,  as well as right hilar and peribronchial stations. Additional abnormal lymph node in left supra clavicular location. The  largest conglomerate of abnormal appearing lymph nodes in the subcarinal region measures 2.9 x 5.4 cm. The trachea is patent. Lungs/Pleura: There is a large subpleural/peri diaphragmatic soft tissue mass in the right lower lobe measuring 3.4 by 4.1 by 3.5 cm. Tiny 2-3 mm perifissural soft tissue nodule is seen in the right middle lobe, image 103/142, sequence 4. A second soft tissue nodule in the more inferior right middle lobe measures 3 mm, image 109/142, sequence 4. There are areas of linear atelectasis in the bilateral lung bases and lingula. Musculoskeletal: No chest wall mass or suspicious bone lesions identified. CT ABDOMEN PELVIS FINDINGS Hepatobiliary: No focal liver abnormality is seen. Status post cholecystectomy. No biliary dilatation. Pancreas: Unremarkable. No pancreatic ductal dilatation or surrounding inflammatory changes. Spleen: Normal in size without focal abnormality. Adrenals/Urinary Tract: 1.4 cm indeterminate right adrenal mass. The left adrenal gland is normal. The kidneys are normal. No left renal masses appreciated. No hydronephrosis or nephrolithiasis. There is a right peritrigonal bladder diverticulum. Stomach/Bowel: Stomach is within normal limits. Appendix appears normal. No evidence of bowel wall thickening, distention, or inflammatory changes. Left colonic diverticulosis without evidence of diverticulitis. Vascular/Lymphatic: Aortic atherosclerosis. No enlarged abdominal or pelvic lymph nodes. Reproductive: Enlarged prostate gland. Other: Bilateral fat containing inguinal hernias. Musculoskeletal: No acute osseous findings. Spondylosis of the spine. IMPRESSION: 1. 3.4 cm right lower lobe subpleural/peri diaphragmatic soft tissue pulmonary mass, highly suspicious for primary pulmonary malignancy or metastatic lesion. 2. Marked bilateral mediastinal and right hilar/peribronchial lymphadenopathy. 3. The mid esophagus is compressed or contiguous with the left paratracheal  lymphadenopathy/mass. Esophageal involvement cannot be excluded. 4. 1.4 cm indeterminate right adrenal mass. 5. Left colonic diverticulosis without evidence of diverticulitis. 6. Bilateral fat containing inguinal hernias. 7. Enlarged prostate gland. Please correlate to serum PSA values. Aortic Atherosclerosis (ICD10-I70.0). These results will be called to the ordering clinician or representative by the Radiologist Assistant, and communication documented in the PACS or zVision Dashboard. Electronically Signed   By: Fidela Salisbury M.D.   On: 03/26/2019 14:00   [place] Fna Soft Tissue  Result Date: 03/27/2019 INDICATION: No known primary, now with concern for metastatic lung cancer. Please perform ultrasound-guided biopsy of indeterminate left supraclavicular lymph node for tissue diagnostic purposes. EXAM: ULTRASOUND-GUIDED LEFT SUPRACLAVICULAR LYMPH NODE BIOPSY COMPARISON:  CT the chest, abdomen and pelvis-03/26/2019 MEDICATIONS: None ANESTHESIA/SEDATION: Moderate (conscious) sedation was employed during this procedure. A total of Versed 1 mg and Fentanyl 25 mcg was administered intravenously. Moderate Sedation Time: 10 minutes. The patient's level of consciousness and vital signs were monitored continuously by radiology nursing throughout the procedure under my direct supervision. COMPLICATIONS: None immediate. TECHNIQUE: Informed written consent was obtained from the patient after a discussion of the risks, benefits and alternatives to treatment. Questions regarding the procedure were encouraged and answered. Initial ultrasound scanning demonstrated an approximately 1.8 x 1.4 cm left supraclavicular lymph node (image 4) correlating with the lymph node seen on preceding CT scan image 14, series 3). An ultrasound image was saved for documentation purposes. The procedure was planned. A timeout was performed prior to the initiation of the procedure. The operative was prepped and draped in the usual sterile fashion,  and a sterile drape was applied covering the operative field. A timeout was performed prior to the initiation of the procedure. Local anesthesia was provided with 1% lidocaine with epinephrine. Under direct ultrasound guidance, an 18 gauge core needle  device was utilized to obtain to obtain 3 core needle biopsies of the indeterminate left supraclavicular lymph node. The samples were placed in saline and submitted to pathology. The needle was removed and hemostasis was achieved with manual compression. Post procedure scan was negative for significant hematoma. A dressing was placed. The patient tolerated the procedure well without immediate postprocedural complication. IMPRESSION: Technically successful ultrasound guided biopsy of dominant left supraclavicular lymph node. Electronically Signed   By: Sandi Mariscal M.D.   On: 03/27/2019 16:19     Labs:   Basic Metabolic Panel: Recent Labs  Lab 04/05/19 0326  NA 135  K 4.6  CL 104  CO2 23  GLUCOSE 125*  BUN 32*  CREATININE 0.96  CALCIUM 8.6*   GFR Estimated Creatinine Clearance: 72 mL/min (by C-G formula based on SCr of 0.96 mg/dL). Liver Function Tests: No results for input(s): AST, ALT, ALKPHOS, BILITOT, PROT, ALBUMIN in the last 168 hours. No results for input(s): LIPASE, AMYLASE in the last 168 hours. No results for input(s): AMMONIA in the last 168 hours. Coagulation profile No results for input(s): INR, PROTIME in the last 168 hours.  CBC: Recent Labs  Lab 04/05/19 0326  WBC 13.8*  NEUTROABS 11.8*  HGB 13.7  HCT 39.9  MCV 97.3  PLT 183   Cardiac Enzymes: No results for input(s): CKTOTAL, CKMB, CKMBINDEX, TROPONINI in the last 168 hours. BNP: Invalid input(s): POCBNP CBG: No results for input(s): GLUCAP in the last 168 hours. D-Dimer No results for input(s): DDIMER in the last 72 hours. Hgb A1c No results for input(s): HGBA1C in the last 72 hours. Lipid Profile No results for input(s): CHOL, HDL, LDLCALC, TRIG,  CHOLHDL, LDLDIRECT in the last 72 hours. Thyroid function studies No results for input(s): TSH, T4TOTAL, T3FREE, THYROIDAB in the last 72 hours.  Invalid input(s): FREET3 Anemia work up No results for input(s): VITAMINB12, FOLATE, FERRITIN, TIBC, IRON, RETICCTPCT in the last 72 hours. Microbiology Recent Results (from the past 240 hour(s))  SARS CORONAVIRUS 2 (TAT 6-24 HRS) Nasopharyngeal Nasopharyngeal Swab     Status: None   Collection Time: 04/02/19  9:48 AM   Specimen: Nasopharyngeal Swab  Result Value Ref Range Status   SARS Coronavirus 2 NEGATIVE NEGATIVE Final    Comment: (NOTE) SARS-CoV-2 target nucleic acids are NOT DETECTED. The SARS-CoV-2 RNA is generally detectable in upper and lower respiratory specimens during the acute phase of infection. Negative results do not preclude SARS-CoV-2 infection, do not rule out co-infections with other pathogens, and should not be used as the sole basis for treatment or other patient management decisions. Negative results must be combined with clinical observations, patient history, and epidemiological information. The expected result is Negative. Fact Sheet for Patients: SugarRoll.be Fact Sheet for Healthcare Providers: https://www.woods-mathews.com/ This test is not yet approved or cleared by the [place] FDA and  has been authorized for detection and/or diagnosis of SARS-CoV-2 by FDA under an Emergency Use Authorization (EUA). This EUA will remain  in effect (meaning this test can be used) for the duration of the COVID-19 declaration under Section 56 4(b)(1) of the Act, 21 U.S.C. section 360bbb-3(b)(1), unless the authorization is terminated or revoked sooner. Performed at [facility], [place] [address], [place], [place] [address]     Signed: Terrilee Croak  [facility] 04/05/2019, 10:15 AM

## 2019-04-05 NOTE — TOC Progression Note (Signed)
Transition of Care [facility]) - Progression Note    Patient Details  Name: Randall Mcgee MRN: [id number] Date of Birth: 04/05/1941  Transition of Care [facility]) CM/SW [place], [place] Phone Number: 04/05/2019, 9:37 AM  Clinical Narrative:   CSW worked on patient discharge throughout the day today. CSW reached out to [place] to check on status of authorization, authorization is still pending. [place] called CSW later in the day to ask patient's family to also be calling to check on authorization status and push [employer] to process the request. Wife says she has called several times already.   CSW met with patient and wife later in the afternoon to discuss going home. Patient and wife both frustrated that the insurance request has taken this long, and patient says at this point he feels well enough to just go home and give up on the SNF request. Wife is still nervous about the patient going home, but also frustrated with waiting and just wants to feel like something is happening instead of just waiting in the hospital. CSW discussed with them about going home and home health services. Patient and wife asked if someone would come evaluate the home for safety before he was discharged home, and CSW indicated that home health has 48 hours upon the patient's return home to set up services. Wife indicated that she would have to rearrange furniture in the hallways and bedroom before the patient returned home so that there was enough room for his walker, requested discharge home tomorrow.   CSW discussed with MD, plan for discharge home tomorrow as [facility] authorization was never processed for SNF despite multiple calls and requests to expedite process. Patient walker ordered through [employer] and home health services set up with [facility].     Expected Discharge Plan: [place] Barriers to Discharge: Continued Medical Work up  Expected Discharge Plan and Services Expected Discharge Plan: [place] In-house Referral: Clinical Social Work Discharge Planning Services: CM Consult Post Acute Care Choice: [place] arrangements for the past 2 months: Single Family Home Expected Discharge Date: 04/02/19                                     Social Determinants of Health (SDOH) Interventions    Readmission Risk Interventions No flowsheet data found.

## 2019-04-05 NOTE — TOC Transition Note (Signed)
Transition of Care [facility]) - CM/SW Discharge Note   Patient Details  Name: Randall Mcgee MRN: [id number] Date of Birth: [birth year]  Transition [facility] Care [facility]) CM/SW Contact:  Pollie Friar, RN Phone Number: 04/05/2019, 11:39 AM   Clinical Narrative:    Pt is discharging home today. Cory with [facility] is aware of d/c. Pt has walker in the room.  Pt has transportation home.   Final next level of care: Home w Home Health Services Barriers to Discharge: No Barriers Identified   Patient Goals and CMS Choice   CMS Medicare.gov Compare Post Acute Care list provided to:: Patient Represenative (must comment) Choice offered to / list presented to : Spouse  Discharge Placement                       Discharge Plan and Services In-house Referral: Clinical Social Work Discharge Planning Services: CM Consult Post Acute Care Choice: Skilled Nursing Facility                    HH Arranged: OT, PT [facility] Agency: [place]        Social Determinants of Health (SDOH) Interventions     Readmission Risk Interventions No flowsheet data found.

## 2019-04-07 ENCOUNTER — Telehealth: Payer: Self-pay | Admitting: Internal Medicine

## 2019-04-07 ENCOUNTER — Other Ambulatory Visit: Payer: Self-pay | Admitting: *Deleted

## 2019-04-07 ENCOUNTER — Inpatient Hospital Stay: Payer: Medicare Other | Attending: Radiation Oncology

## 2019-04-07 DIAGNOSIS — I69198 Other sequelae of nontraumatic intracerebral hemorrhage: Secondary | ICD-10-CM | POA: Diagnosis not present

## 2019-04-07 DIAGNOSIS — C434 Malignant melanoma of scalp and neck: Secondary | ICD-10-CM | POA: Diagnosis not present

## 2019-04-07 DIAGNOSIS — C7931 Secondary malignant neoplasm of brain: Secondary | ICD-10-CM | POA: Diagnosis not present

## 2019-04-07 DIAGNOSIS — C7932 Secondary malignant neoplasm of cerebral meninges: Secondary | ICD-10-CM | POA: Diagnosis not present

## 2019-04-07 DIAGNOSIS — H5462 Unqualified visual loss, left eye, normal vision right eye: Secondary | ICD-10-CM | POA: Diagnosis not present

## 2019-04-07 NOTE — Telephone Encounter (Signed)
Patient wife stated that her husband was in the hospital for a stroke.  He needs a hospital follow-up, however the only thing is a virtual time slot on Friday.  Patient wants to be seen before Friday so I didn't know if he might could see another provider for this.

## 2019-04-08 ENCOUNTER — Other Ambulatory Visit: Payer: Self-pay | Admitting: *Deleted

## 2019-04-08 DIAGNOSIS — D4989 Neoplasm of unspecified behavior of other specified sites: Secondary | ICD-10-CM

## 2019-04-08 NOTE — Telephone Encounter (Signed)
Appointment scheduled.

## 2019-04-08 NOTE — Telephone Encounter (Signed)
Pt's spouse called back in to schedule hospital follow up with PCP.   Please assist with scheduling

## 2019-04-10 ENCOUNTER — Ambulatory Visit (INDEPENDENT_AMBULATORY_CARE_PROVIDER_SITE_OTHER): Payer: Medicare Other | Admitting: Internal Medicine

## 2019-04-10 ENCOUNTER — Telehealth: Payer: Self-pay | Admitting: Internal Medicine

## 2019-04-10 VITALS — BP 120/70 | HR 56 | Temp 97.9°F

## 2019-04-10 DIAGNOSIS — C7932 Secondary malignant neoplasm of cerebral meninges: Secondary | ICD-10-CM | POA: Diagnosis not present

## 2019-04-10 DIAGNOSIS — C434 Malignant melanoma of scalp and neck: Secondary | ICD-10-CM | POA: Diagnosis not present

## 2019-04-10 DIAGNOSIS — Z09 Encounter for follow-up examination after completed treatment for conditions other than malignant neoplasm: Secondary | ICD-10-CM

## 2019-04-10 DIAGNOSIS — I612 Nontraumatic intracerebral hemorrhage in hemisphere, unspecified: Secondary | ICD-10-CM | POA: Diagnosis not present

## 2019-04-10 DIAGNOSIS — I69198 Other sequelae of nontraumatic intracerebral hemorrhage: Secondary | ICD-10-CM | POA: Diagnosis not present

## 2019-04-10 DIAGNOSIS — R531 Weakness: Secondary | ICD-10-CM

## 2019-04-10 DIAGNOSIS — C7931 Secondary malignant neoplasm of brain: Secondary | ICD-10-CM

## 2019-04-10 DIAGNOSIS — H5462 Unqualified visual loss, left eye, normal vision right eye: Secondary | ICD-10-CM | POA: Diagnosis not present

## 2019-04-10 NOTE — Addendum Note (Signed)
Addended by: Westley Hummer B on: 04/10/2019 12:57 PM   Modules accepted: Orders

## 2019-04-10 NOTE — Telephone Encounter (Signed)
Yes

## 2019-04-10 NOTE — Telephone Encounter (Signed)
Home Health Verbal Orders - Caller/AgencyRod Mae Number: [phone], OK to leave a message Requesting OT/PT/Skilled Nursing/Social Work/Speech Therapy: Education officer, museum Frequency: evaluation  Requesting OT/PT/Skilled Nursing/Social Work/Speech Therapy: home health aide Frequency: once a week for three weeks starting next week.

## 2019-04-10 NOTE — Patient Instructions (Signed)
-  Nice seeing you today!!  -Come back to see [place] as needed.

## 2019-04-10 NOTE — Progress Notes (Signed)
Established Patient Office Visit     CC/Reason for Visit: Hospital follow-up  HPI: Randall Mcgee is a 78 y.o. male who is coming in today for the above mentioned reasons. Past Medical History is significant for:  Hypertension, hyperlipidemia, erectile dysfunction.  He states that with his wife's breast cancer diagnosis he has ongoing anxiety, has been prescribed Xanax for this.   Since I last saw him he was hospitalized from 03/25/2019 to 04/05/2019.  He went to the emergency department because he was stumbling and losing balance with loss of concentration and memory.  He was found to have hemorrhagic lesions of the right posterior occipital lobe.  Subsequently biopsy of a left supraclavicular lymph node gave the diagnosis of a metastatic melanoma.  He is seeing oncology and radiation oncology next week to come up with a treatment plan.  He was discharged on oral dexamethasone which he has been taking.  His wife comes in with him to the appointment today.  Their main complaint is severe weakness and deconditioning.  Apparently he had a fall at home while he was shaving he fell backwards onto the wife causing both of them to tumble to the floor.  Fortunately no injuries were sustained.  Reviewing hospital notes it appears that PT had recommended SNF for rehab, however patient and wife were frustrated because of delay of insurance authorization for SNF and they decided to go home with home health instead.  He had his assessment earlier this week.  Wife is upset about the delay between hospital discharge and first contact with home health.  They are very concerned about assistance around the house.  She is inquiring about private duty sitters and to see if they would qualify for any meal assistance as patient is the one who was used to cooking and is no longer able to.  Patient is not having headaches but has been having difficulty reading because "my eyes jump all over the place".  There is a clear  lack of concentration and lack of following directions per wife's report.   Past Medical/Surgical History: Past Medical History:  Diagnosis Date  . Anxiety   . Cancer ([place]) 2017   melanoma on scalp  . History of chicken pox   . Hyperlipidemia   . Hypertension   . Right inguinal hernia     Past Surgical History:  Procedure Laterality Date  . APPLICATION OF A-CELL OF EXTREMITY N/A 07/13/2016   Procedure: APPLICATION OF A-CELL;  Surgeon: Wallace Going, DO;  Location: [place];  Service: Plastics;  Laterality: N/A;  . APPLICATION OF A-CELL OF HEAD/NECK N/A 06/28/2016   Procedure: APPLICATION OF A-CELL OF HEAD/NECK;  Surgeon: Wallace Going, DO;  Location: [place];  Service: Plastics;  Laterality: N/A;  . APPLICATION OF A-CELL OF HEAD/NECK N/A 07/26/2016   Procedure: APPLICATION OF ACELL OF HEAD;  Surgeon: Wallace Going, DO;  Location: [place];  Service: Plastics;  Laterality: N/A;  . APPLICATION OF A-CELL OF HEAD/NECK N/A 08/31/2016   Procedure: APPLICATION OF A-CELL OF HEAD/NECK;  Surgeon: Wallace Going, DO;  Location: [place];  Service: Plastics;  Laterality: N/A;  . CATARACT EXTRACTION, BILATERAL    . CHOLECYSTECTOMY    . ENDARTERECTOMY  06/20/11  . ENDARTERECTOMY  06/20/2011   Procedure: ENDARTERECTOMY CAROTID;  Surgeon: Elam Dutch, MD;  Location: [facility] OR;  Service: Vascular;  Laterality: Right;  Right Carotid endarterectomy with dacron patch  angioplasty   . INGUINAL HERNIA REPAIR Right 08/16/2018   Procedure: RIGHT INGUINAL HERNIA REPAIR WITH MESH;  Surgeon: Georganna Skeans, MD;  Location: [place];  Service: General;  Laterality: Right;  . MASS EXCISION N/A 06/28/2016   Procedure: EXCISION SCALP MELANOMA;  Surgeon: Wallace Going, DO;  Location: [place];  Service: Plastics;  Laterality: N/A;  . MASS EXCISION N/A 07/13/2016   Procedure: RE- EXCISION OF  SCALP MELANOMA;  Surgeon: Wallace Going, DO;  Location: [place];  Service: Plastics;  Laterality: N/A;  . MASS EXCISION N/A 07/26/2016   Procedure: RE-EXCISION OF SCALP MELANOMA FOR POSITIVE MARGAIN;  Surgeon: Wallace Going, DO;  Location: [place];  Service: Plastics;  Laterality: N/A;  . SKIN SPLIT GRAFT Left 08/31/2016   Procedure: SKIN GRAFT SPLIT THICKNESS TO HIS SCALP FROM HIS THIGH;  Surgeon: Wallace Going, DO;  Location: [place];  Service: Plastics;  Laterality: Left;  . TONSILLECTOMY    . TONSILLECTOMY      Social History:  reports that he has never smoked. He has never used smokeless tobacco. He reports that he does not drink alcohol or use drugs.  Allergies: No Known Allergies  Family History:  Family History  Problem Relation Age of Onset  . Heart disease Mother      Current Outpatient Medications:  .  acetaminophen (TYLENOL) 325 MG tablet, Take 2 tablets (650 mg total) by mouth every 6 (six) hours as needed for mild pain or headache., Disp:  , Rfl:  .  amLODipine (NORVASC) 5 MG tablet, Take 1 tablet (5 mg total) by mouth daily., Disp: 30 tablet, Rfl: 0 .  cetirizine (ZYRTEC) 10 MG tablet, Take 1 tablet (10 mg total) by mouth daily. (Patient taking differently: Take 10 mg by mouth at bedtime. ), Disp: 30 tablet, Rfl: 11 .  dexamethasone (DECADRON) 4 MG tablet, Take 1 tablet (4 mg total) by mouth every 8 (eight) hours for 14 days., Disp: 42 tablet, Rfl: 0 .  lisinopril (PRINIVIL,ZESTRIL) 40 MG tablet, Take 1 tablet (40 mg total) by mouth daily., Disp: 90 tablet, Rfl: 3 .  Multiple Vitamins-Minerals (CENTRUM SILVER PO), Take 1 tablet by mouth daily. , Disp: , Rfl:  .  propranolol (INDERAL) 20 MG tablet, Take 40 mg by mouth 2 (two) times daily., Disp: , Rfl:  .  vitamin B-12 (CYANOCOBALAMIN) 1000 MCG tablet, Take 1 tablet (1,000 mcg total) by mouth daily., Disp: , Rfl:   Review of Systems:  Constitutional:  Denies fever, chills, diaphoresis, appetite change and fatigue.  HEENT: Denies photophobia, eye pain, redness, hearing loss, ear pain, congestion, sore throat, rhinorrhea, sneezing, mouth sores, trouble swallowing, neck pain, neck stiffness and tinnitus.   Respiratory: Denies SOB, DOE, cough, chest tightness,  and wheezing.   Cardiovascular: Denies chest pain, palpitations and leg swelling.  Gastrointestinal: Denies nausea, vomiting, abdominal pain, diarrhea, constipation, blood in stool and abdominal distention.  Genitourinary: Denies dysuria, urgency, frequency, hematuria, flank pain and difficulty urinating.  Endocrine: Denies: hot or cold intolerance, sweats, changes in hair or nails, polyuria, polydipsia. Musculoskeletal: Denies myalgias, back pain, joint swelling, arthralgias and gait problem.  Skin: Denies pallor, rash and wound.  Neurological: Denies dizziness, seizures, syncope,numbness and headaches.  Hematological: Denies adenopathy. Easy bruising, personal or family bleeding history  Psychiatric/Behavioral: Denies suicidal ideation, mood changes, confusion, nervousness, sleep disturbance and agitation    Physical Exam: Vitals:   04/10/19 1111  BP: 120/70  Pulse: (!) 56  Temp: 97.9 F (36.6 C)  TempSrc: Temporal  SpO2: 95%    Constitutional: NAD, calm, comfortable Eyes: PERRL, lids and conjunctivae normal ENMT: Mucous membranes are moist. Respiratory: clear to auscultation bilaterally, no wheezing, no crackles. Normal respiratory effort. No accessory muscle use.  Cardiovascular: Regular rate and rhythm, no murmurs / rubs / gallops. No extremity edema. 2+ pedal pulses. No carotid bruits.  Abdomen: no tenderness, no masses palpated. No hepatosplenomegaly. Bowel sounds positive.  Musculoskeletal: no clubbing / cyanosis. No joint deformity upper and lower extremities. Good ROM, no contractures. Normal muscle tone.  Skin: no rashes, lesions, ulcers. No induration Psychiatric:  Normal judgment and insight. Alert and oriented x 3. Normal mood.    Impression and Plan:  Hospital discharge follow-up Malignant melanoma of scalp ([place]) Nontraumatic hemorrhage of cerebral hemisphere, unspecified laterality ([place]) Brain metastases ([facility]) Weakness  -Continue dexamethasone. -Already has follow-up next week with oncology and radiation oncology to determine treatment plan.  Suspect this will contain a mix of radiation and chemo therapy/immunomodulators. -For weakness and deconditioning, have explained to wife and patient that since they refused SNF, only avenue we have is home health.  Will inquire with her home health agency if they have maximal services to include PT/OT/social work/aide.  They are requesting a list of private duty sitters.  We will also send in a [facility] referral.   Time spent: 30 minutes. Greater than 50% of this time was spent in direct contact with the patient and with patient's wife, coordinating care and discussing relevant ongoing clinical issues.   Patient Instructions  -Nice seeing you today!!  -Come back to see [place] as needed.     Lelon Frohlich, MD [facility]

## 2019-04-11 ENCOUNTER — Other Ambulatory Visit: Payer: Self-pay

## 2019-04-11 NOTE — Telephone Encounter (Signed)
Sreejesh calling to see if there is any update on this that he can have.  States that pt is getting frustrated this is taking so long.

## 2019-04-11 NOTE — Telephone Encounter (Signed)
Verbal given orders given

## 2019-04-11 NOTE — Patient Outreach (Signed)
[facility]) Care Management  04/11/2019  Randall Mcgee Jan 30, 1941 [id number]   Telephone Screen  Referral Date: 04/10/2019 Referral Source: MD Office Referral Reason: "needs home assistance, cancer with mets" Insurance: [facility] Medicare   Outreach attempt #1 to patient. Spoke with spouse(DPR on file). RN CM reviewed and dicussed referral source and reason with spouse.RN CM explained to spouse that [facility] does not provide hands on care and make home visits but staff could assist with getting these services in place. Spouse is very anxious and upset that no one has been able to come to their home to assist patient. RN CM  discussed with spouse what types of in home support she is looking for. She initially voiced she wanted someone to come out to bathe and shave patient. She then states she also wanted to see if someone could assist with preparing meals. RN  M discussed with patient [place] services vs [facility]CS along with what Medicare does and does not cover and pay for. Advised spouse that per notes from MD office [facility] services orders are in process and someone from [facility] agency should be contacting spouse soon. Spouse insistent that this needs to take place before the weekend. Advised spouse that RN CM was not affiliated with [facility] agency and could not guarantee to her that someone from agency would contact her by the end of the day. However, RN CM advised spouse of what [facility] agency can and can not do. Discussed possible referral to [place] for [facility] and/or paid caregivers in the home to provide further support. Advised spouse that this would be an out of pocket charge for her to have services in the home. Spouse requesting for SW to come to the home to discuss this with her. Advised spouse that due to COVID-19 [facility] staff not making home visits but SW could discuss this info via phone as well as mail her out info. Spouse adamant that she wanted to speak with a SW today. Advised spouse of [facility] SW referral process  and that RN CM could not guarantee that a SW could call her today in a few hours before office closes. Spouse became upset and voiced that she did not want to wait until next week to speak with someone and abruptly declined to continue conversation, refusing further [facility] assistance and ended call.     Plan: RN CM will close case due to spousal refusal. RN CM will send MD case closure letter.    Enzo Montgomery, RN,BSN,CCM [place] Management Telephonic Care Management Coordinator Direct Phone: [phone] Toll Free: [phone] Fax: [phone]

## 2019-04-14 DIAGNOSIS — C434 Malignant melanoma of scalp and neck: Secondary | ICD-10-CM | POA: Diagnosis not present

## 2019-04-14 DIAGNOSIS — H5462 Unqualified visual loss, left eye, normal vision right eye: Secondary | ICD-10-CM | POA: Diagnosis not present

## 2019-04-14 DIAGNOSIS — I69198 Other sequelae of nontraumatic intracerebral hemorrhage: Secondary | ICD-10-CM | POA: Diagnosis not present

## 2019-04-14 DIAGNOSIS — C7932 Secondary malignant neoplasm of cerebral meninges: Secondary | ICD-10-CM | POA: Diagnosis not present

## 2019-04-14 DIAGNOSIS — C7931 Secondary malignant neoplasm of brain: Secondary | ICD-10-CM | POA: Diagnosis not present

## 2019-04-15 ENCOUNTER — Other Ambulatory Visit: Payer: Self-pay | Admitting: *Deleted

## 2019-04-15 DIAGNOSIS — H5462 Unqualified visual loss, left eye, normal vision right eye: Secondary | ICD-10-CM | POA: Diagnosis not present

## 2019-04-15 DIAGNOSIS — C434 Malignant melanoma of scalp and neck: Secondary | ICD-10-CM | POA: Diagnosis not present

## 2019-04-15 DIAGNOSIS — C7932 Secondary malignant neoplasm of cerebral meninges: Secondary | ICD-10-CM | POA: Diagnosis not present

## 2019-04-15 DIAGNOSIS — C439 Malignant melanoma of skin, unspecified: Secondary | ICD-10-CM

## 2019-04-15 DIAGNOSIS — I69198 Other sequelae of nontraumatic intracerebral hemorrhage: Secondary | ICD-10-CM | POA: Diagnosis not present

## 2019-04-15 DIAGNOSIS — C7931 Secondary malignant neoplasm of brain: Secondary | ICD-10-CM | POA: Diagnosis not present

## 2019-04-16 ENCOUNTER — Telehealth: Payer: Self-pay | Admitting: Radiation Therapy

## 2019-04-16 ENCOUNTER — Telehealth: Payer: Self-pay | Admitting: Internal Medicine

## 2019-04-16 ENCOUNTER — Inpatient Hospital Stay: Payer: Medicare Other

## 2019-04-16 ENCOUNTER — Telehealth: Payer: Self-pay | Admitting: *Deleted

## 2019-04-16 ENCOUNTER — Inpatient Hospital Stay: Payer: Medicare Other | Admitting: Internal Medicine

## 2019-04-16 NOTE — Telephone Encounter (Signed)
R/s appt per 9/23 sch message - pt wife is aware of apt

## 2019-04-16 NOTE — Telephone Encounter (Signed)
Error

## 2019-04-16 NOTE — Telephone Encounter (Signed)
Call from [place] advised pt collapsed prior to getting into transportation, EMS called, pt refused to go to ED, pt unable to come in for his appt today. Message to scheduling to R/S lab/MD visit

## 2019-04-16 NOTE — Telephone Encounter (Signed)
While trying to walk to the car to come in for his Med Onc appointment, [name] legs gave out and he collapsed. He didn't fall or hurt himself, he just came down to sit on their front steps. The neighbor called EMS to evaluate and to help get back into the house. He did not want to go to the hospital and he is not interested in trying to come in for his appointment with Dr. Julien Nordmann today due to this event.   I called and let Dr. Worthy Flank nurse, Stanton Kidney, know that he is not going to make it in today and that the appointment needs to be rescheduled.   Mr Pessin is scheduled to have a brain MRI tomorrow, his son is coming into town to make sure he gets to this visit. I will send a message to our social work team to see if there are other options for transportation for him.  Mont Dutton R.T.(R)(T) Special Procedures Navigator

## 2019-04-17 ENCOUNTER — Other Ambulatory Visit: Payer: Self-pay

## 2019-04-17 ENCOUNTER — Ambulatory Visit
Admission: RE | Admit: 2019-04-17 | Discharge: 2019-04-17 | Disposition: A | Discharge status: Home / Self Care | Payer: Medicare Other | Source: Ambulatory Visit | Attending: Urology | Admitting: Urology

## 2019-04-17 ENCOUNTER — Telehealth: Payer: Self-pay | Admitting: Internal Medicine

## 2019-04-17 DIAGNOSIS — C7931 Secondary malignant neoplasm of brain: Secondary | ICD-10-CM

## 2019-04-17 DIAGNOSIS — C439 Malignant melanoma of skin, unspecified: Secondary | ICD-10-CM | POA: Diagnosis not present

## 2019-04-17 MED ORDER — GADOBENATE DIMEGLUMINE 529 MG/ML IV SOLN
20.0000 mL | Freq: Once | INTRAVENOUS | Status: AC | PRN
  Administered 2019-04-17: 20 mL via INTRAVENOUS

## 2019-04-17 NOTE — Progress Notes (Signed)
Radiation Oncology         [phone] ________________________________  Follow up New visit  Name: Randall Mcgee MRN: [id number]  Date: 04/18/2019  DOB: 09-30-1940  [id number] Randall Beals, MD  Randall Mcgee, Estel*   REFERRING PHYSICIAN: Isaac Mcgee, [place]*  DIAGNOSIS: 78 y.o. gentleman with metastatic melanoma with multiple brain metastases    ICD-10-CM   1. Brain metastases ([place])  C79.31     NARRATIVE: The patient presents today for further discussion regarding treatment of his metastatic brain disease s/p recent discharge home from the hospital. He has a prior history of Malignant melanoma of the scalp initially diagnosed and treated and treated at [facility] with Mohs surgery in 2012 and more recently had disease recurrence with a new lesion at the incisional scar from his previous procedure in 06/2016.  This was treated with surgical excision initially on 06/28/2016 and reexcision on 07/13/2016 for positive margins and again on 07/26/2016 for positive margins.  Pathology from the excision on 07/26/2016 showed no residual melanoma.  He was initially seen as an inpatient on 03/26/2019 following presentation to the ED on 03/25/2019 with mental changes, including imbalance, headaches, memory issues, and general weakness and was admitted. CT head obtained at the time of admission showed multiple areas of acute hemorrhage in the bilateral occipital and left temporal lobes with the largest area in the right posterior occipital lobe measuring 5 x 3 cm with mass-effect on the right lateral ventricle.  An MRI brain was performed on 03/26/2019 for further evaluation showing the previously identified intraparenchymal hematoma in the right parieto-occipital region with an estimated volume of 34 cc and surrounding vasogenic edema throughout the right parieto-occipital region with trace right to left midline shift and underlying metastatic lesion suspected.  There were multiple additional scattered  enhancing masses along bilateral cerebral hemispheres, all of which demonstrated associated susceptibility artifact, consistent with hemorrhagic metastases.  A CT chest/abdomen/pelvis was obtained for disease staging and demonstrated a 3.4 cm right lower lobe subpleural/peri-diaphragmatic soft tissue pulmonary mass, highly suspicious for primary pulmonary malignancy versus metastatic lesion, as well as marked bilateral mediastinal and right hilar/peribronchial lymphadenopathy. Esophageal involvement could not be excluded as the mid esophagus appeared compressed or contiguous with the left paratracheal lymphadenopathy/mass.  Additionally, there was a 1.4 cm indeterminate right adrenal mass.  U/S guided biopsy of a left supraclavicular lymph node was performed on 03/27/2019, with pathology confirming metastatic malignant melanoma. During admission, the patient was started on IV dexamethasone 4 mg every 6 hours with reported improvement in his headache and mental clarity. He was discharged home on Dexamethasone 4mg  TID which he has continued taking.  He reports swelling in bilateral lower extremities and hypersensitivity of his tongue and gums since starting the steroids. He also continues with visual deficiency in the left eye and difficulty reading and writing which was present for several weeks prior to his hospital admission. His headaches and balance are somewhat improved since discharge home and he continues working with OP PT for strength and conditioning.  He did have an episode of weakness in his LEs on 04/17/19 which caused him to collapse down on the stairs at his home but he denies actually falling or injuring himself and refused to return to the hospital for further evaluation.  He had a recent 3T SRS protocol MRI brain scan for treatment planning purposes on 04/17/19 and this scan confirmed at least 9 metastatic deposits in the brain, most of which show evidence of prior hemorrhage  or melanin deposition  due to metastatic melanoma with the largest lesion in the right occipital parietal lobe measuring 43 x 37 mm, similar to prior MRI, with stable surrounding edema and local mass-effect. There was a new punctate area of restricted diffusion in the right cerebellum without enhancement and therefore felt to be of indeterminate origin.  He presents today to review the recent SRS protocol MRI results and further discuss potential radiotherapy options for management of his metastatic brain disease.  PREVIOUS RADIATION THERAPY: No  PAST MEDICAL HISTORY:  Past Medical History:  Diagnosis Date   Anxiety    Cancer ([place]) 2017   melanoma on scalp   History of chicken pox    Hyperlipidemia    Hypertension    Right inguinal hernia       PAST SURGICAL HISTORY: Past Surgical History:  Procedure Laterality Date   APPLICATION OF A-CELL OF EXTREMITY N/A 07/13/2016   Procedure: APPLICATION OF A-CELL;  Surgeon: Randall Going, DO;  Location: [place];  Service: Plastics;  Laterality: N/A;   APPLICATION OF A-CELL OF HEAD/NECK N/A 06/28/2016   Procedure: APPLICATION OF A-CELL OF HEAD/NECK;  Surgeon: Randall Going, DO;  Location: [place];  Service: Plastics;  Laterality: N/A;   APPLICATION OF A-CELL OF HEAD/NECK N/A 07/26/2016   Procedure: APPLICATION OF ACELL OF HEAD;  Surgeon: Randall Going, DO;  Location: [place];  Service: Plastics;  Laterality: N/A;   APPLICATION OF A-CELL OF HEAD/NECK N/A 08/31/2016   Procedure: APPLICATION OF A-CELL OF HEAD/NECK;  Surgeon: Randall Going, DO;  Location: [place];  Service: Plastics;  Laterality: N/A;   CATARACT EXTRACTION, BILATERAL     CHOLECYSTECTOMY     ENDARTERECTOMY  06/20/11   ENDARTERECTOMY  06/20/2011   Procedure: ENDARTERECTOMY CAROTID;  Surgeon: Randall Dutch, MD;  Location: [facility] OR;  Service: Vascular;  Laterality: Right;  Right Carotid endarterectomy with  dacron patch angioplasty    INGUINAL HERNIA REPAIR Right 08/16/2018   Procedure: RIGHT INGUINAL HERNIA REPAIR WITH MESH;  Surgeon: Randall Skeans, MD;  Location: [place];  Service: General;  Laterality: Right;   MASS EXCISION N/A 06/28/2016   Procedure: EXCISION SCALP MELANOMA;  Surgeon: Randall Going, DO;  Location: [place];  Service: Plastics;  Laterality: N/A;   MASS EXCISION N/A 07/13/2016   Procedure: RE- EXCISION OF SCALP MELANOMA;  Surgeon: Randall Going, DO;  Location: [place];  Service: Plastics;  Laterality: N/A;   MASS EXCISION N/A 07/26/2016   Procedure: RE-EXCISION OF SCALP MELANOMA FOR POSITIVE MARGAIN;  Surgeon: Randall Going, DO;  Location: [place];  Service: Plastics;  Laterality: N/A;   SKIN SPLIT GRAFT Left 08/31/2016   Procedure: SKIN GRAFT SPLIT THICKNESS TO HIS SCALP FROM HIS THIGH;  Surgeon: Randall Going, DO;  Location: [place];  Service: Plastics;  Laterality: Left;   TONSILLECTOMY     TONSILLECTOMY      FAMILY HISTORY:  Family History  Problem Relation Age of Onset   Heart disease Mother     SOCIAL HISTORY:  Social History   Socioeconomic History   Marital status: Married    Spouse name: Not on file   Number of children: Not on file   Years of education: Not on file   Highest education level: Not on file  Occupational History   Not on file  Social Needs   Financial resource  strain: Not on file   Food insecurity    Worry: Not on file    Inability: Not on file   Transportation needs    Medical: Not on file    Non-medical: Not on file  Tobacco Use   Smoking status: Never Smoker   Smokeless tobacco: Never Used  Substance and Sexual Activity   Alcohol use: No   Drug use: No    Types: Other-see comments   Sexual activity: Yes  Lifestyle   Physical activity    Days per week: Not on file    Minutes per session: Not on  file   Stress: Not on file  Relationships   Social connections    Talks on phone: Not on file    Gets together: Not on file    Attends religious service: Not on file    Active member of club or organization: Not on file    Attends meetings of clubs or organizations: Not on file    Relationship status: Not on file   Intimate partner violence    Fear of current or ex partner: Not on file    Emotionally abused: Not on file    Physically abused: Not on file    Forced sexual activity: Not on file  Other Topics Concern   Not on file  Social History Narrative   Not on file    ALLERGIES: Patient has no known allergies.  MEDICATIONS:  Current Outpatient Medications  Medication Sig Dispense Refill   amLODipine (NORVASC) 5 MG tablet Take 1 tablet (5 mg total) by mouth daily. 30 tablet 0   lisinopril (PRINIVIL,ZESTRIL) 40 MG tablet Take 1 tablet (40 mg total) by mouth daily. 90 tablet 3   Multiple Vitamins-Minerals (CENTRUM SILVER PO) Take 1 tablet by mouth daily.      propranolol (INDERAL) 20 MG tablet Take 40 mg by mouth 2 (two) times daily.     vitamin B-12 (CYANOCOBALAMIN) 1000 MCG tablet Take 1 tablet (1,000 mcg total) by mouth daily.     acetaminophen (TYLENOL) 325 MG tablet Take 2 tablets (650 mg total) by mouth every 6 (six) hours as needed for mild pain or headache. (Patient not taking: Reported on 04/18/2019)     cetirizine (ZYRTEC) 10 MG tablet Take 1 tablet (10 mg total) by mouth daily. (Patient not taking: Reported on 04/18/2019) 30 tablet 11   dexamethasone (DECADRON) 4 MG tablet Take 1 tablet (4 mg total) by mouth every 8 (eight) hours for 14 days. 42 tablet 0   magic mouthwash w/lidocaine SOLN Take 5 mLs by mouth 4 (four) times daily as needed for mouth pain (swish, gargle and spit out or swallow, prior to meals and at bedtime). 240 mL 1   No current facility-administered medications for this encounter.     REVIEW OF SYSTEMS:  On review of systems, the patient  reports that he is doing well overall. His main complaints today are of increased sensitivity of his tongue and gums which is making it difficult to eat and drink comfortably.  He denies any chest pain, shortness of breath, cough, fevers, chills, night sweats, or recent unintended weight changes. He denies any bowel disturbances, and denies abdominal pain, nausea or vomiting. He denies any new musculoskeletal or joint aches or pains. He has noticed swelling in his feet and ankles since starting steroids. He continues with generalized weakness, decreased visual acuity in the left eye and difficulty reading/writing but specifically denies headaches, tremors, seizure activity or difficulty with speech.  A complete review of systems is obtained and is otherwise negative.  PHYSICAL EXAM:  Wt Readings from Last 3 Encounters:  04/18/19 201 lb (91.2 kg)  04/02/19 207 lb 10.8 oz (94.2 kg)  02/19/19 207 lb 9.6 oz (94.2 kg)   Temp Readings from Last 3 Encounters:  04/18/19 98.5 F (36.9 C) (Oral)  04/10/19 97.9 F (36.6 C) (Temporal)  04/05/19 97.9 F (36.6 C) (Oral)   BP Readings from Last 3 Encounters:  04/18/19 120/63  04/10/19 120/70  04/05/19 105/63   Pulse Readings from Last 3 Encounters:  04/18/19 63  04/10/19 (!) 56  04/05/19 (!) 56   Pain Assessment Pain Score: 0-No pain/10  In general this is a well appearing caucasian male in no acute distress. He's alert and oriented x4 and appropriate throughout the examination. He is [place]/AT and EOMs are intact bilaterally and PERRLA. There is no visible oral thrush, although suspected clinically based on history. Cardiopulmonary assessment is negative for acute distress and he exhibits normal effort.  He appears grossly neurologically intact.   There is 1+ pitting edema in the bilateral LEs.  Sensation is intact to light touch and strength is equal bilaterally.  KPS = 70  100 - Normal; no complaints; no evidence of disease. 90   - Able to carry on  normal activity; minor signs or symptoms of disease. 80   - Normal activity with effort; some signs or symptoms of disease. 22   - Cares for self; unable to carry on normal activity or to do active work. 60   - Requires occasional assistance, but is able to care for most of his personal needs. 50   - Requires considerable assistance and frequent medical care. 41   - Disabled; requires special care and assistance. 51   - Severely disabled; hospital admission is indicated although death not imminent. 41   - Very sick; hospital admission necessary; active supportive treatment necessary. 10   - Moribund; fatal processes progressing rapidly. 0     - Dead  Karnofsky DA, Abelmann [place], Craver LS and Burchenal JH [phone]) The use of the nitrogen mustards in the palliative treatment of carcinoma: with particular reference to bronchogenic carcinoma Cancer 1 [id number]  LABORATORY DATA:  Lab Results  Component Value Date   WBC 13.8 (H) 04/05/2019   HGB 13.7 04/05/2019   HCT 39.9 04/05/2019   MCV 97.3 04/05/2019   PLT 183 04/05/2019   Lab Results  Component Value Date   NA 135 04/05/2019   K 4.6 04/05/2019   CL 104 04/05/2019   CO2 23 04/05/2019   Lab Results  Component Value Date   ALT 24 03/25/2019   AST 48 (H) 03/25/2019   ALKPHOS 41 03/25/2019   BILITOT 0.6 03/25/2019     RADIOGRAPHY: Ct Head Wo Contrast  Result Date: 03/27/2019 CLINICAL DATA:  53-year-old male with follow-up intracranial hemorrhagic metastasis. EXAM: CT HEAD WITHOUT CONTRAST TECHNIQUE: Contiguous axial images were obtained from the base of the skull through the vertex without intravenous contrast. COMPARISON:  Head CT dated 03/25/2019 FINDINGS: Brain: No significant interval change in the size of the right occipital hemorrhagic mass measuring approximately 5 x 3 cm in axial dimensions. Additional smaller hemorrhagic lesions involving the left occipital lobe, and left temporal lobe appear unchanged. There is associated edema  with mild mass effect 5 on the surrounding tissue. No new hemorrhage identified. No midline shift. There is mild age-related atrophy and chronic microvascular ischemic changes. Vascular: No hyperdense vessel or  unexpected calcification. Skull: Normal. Negative for fracture or focal lesion. Sinuses/Orbits: Mild mucoperiosteal thickening paranasal sinuses. No air-fluid level. The mastoid air cells are clear. Other: None IMPRESSION: No significant interval change in the size of the known hemorrhagic metastases or associated edema. No new hemorrhage or midline shift. Continued follow-up recommended. Electronically Signed   By: Anner Crete M.D.   On: 03/27/2019 13:30   Ct Head Wo Contrast  Result Date: 03/25/2019 CLINICAL DATA:  Ct head wo, Pt's wife reports that Sunday morning he woke her up and was extremely disoriented and confused. Pt reported to wife he had a bad headache and temperature. EXAM: CT HEAD WITHOUT CONTRAST TECHNIQUE: Contiguous axial images were obtained from the base of the skull through the vertex without intravenous contrast. COMPARISON:  None. FINDINGS: Brain: There are 3 areas of acute masslike hemorrhage in the bilateral occipital and left temporal lobes. The largest area in the right occipital lobe measures 5.0 x 3.2 cm. A small area in the posterior left occipital lobe measures 1.6 by 1.8 cm. And another area in the left temporal lobe measures 1.4 by 1.1 cm. There is mass effect on the occipital hormone of the right lateral ventricle secondary to edema surrounding the hemorrhage in the posterior right occipital lobe. No midline shift. Mild enlargement of the ventricles is likely secondary to ex vacuo dilation from brain atrophy. Vascular: No hyperdense vessel or unexpected calcification. Skull: Normal. Negative for fracture or focal lesion. Sinuses/Orbits: Mucosal thickening in the bilateral maxillary and ethmoid sinuses. Other: None. IMPRESSION: Multiple areas of acute hemorrhage in the  bilateral occipital and left temporal lobes. The largest area in the right posterior occipital lobe measures 5 x 3 cm with some mass effect on the right lateral ventricle. Differential considerations include hemorrhagic metastasis and emboli. Consider further evaluation with brain MRI. These results were called by telephone at the time of interpretation on 03/25/2019 at 6:55 pm to Dr. Janeece Fitting , who verbally acknowledged these results. Electronically Signed   By: Audie Pinto M.D.   On: 03/25/2019 19:05   Ct Chest W Contrast  Result Date: 03/26/2019 CLINICAL DATA:  Liver/biliary neoplasm. EXAM: CT CHEST, ABDOMEN, AND PELVIS WITH CONTRAST TECHNIQUE: Multidetector CT imaging of the chest, abdomen and pelvis was performed following the standard protocol during bolus administration of intravenous contrast. CONTRAST:  132mL ISOVUE-300 IOPAMIDOL (ISOVUE-300) INJECTION 61% COMPARISON:  Abdominal ultrasound dated 03/25/2019 FINDINGS: CT CHEST FINDINGS Cardiovascular: Normal heart size. No pericardial effusion. Calcific atherosclerotic disease of the coronary arteries and to mild degree the aorta. Mediastinum/Nodes: Marked bilateral mediastinal lymphadenopathy with abnormal lymph nodes in left paratracheal, left prevascular, left precarinal, central subcarinal stations, as well as right hilar and peribronchial stations. Additional abnormal lymph node in left supra clavicular location. The largest conglomerate of abnormal appearing lymph nodes in the subcarinal region measures 2.9 x 5.4 cm. The trachea is patent. Lungs/Pleura: There is a large subpleural/peri diaphragmatic soft tissue mass in the right lower lobe measuring 3.4 by 4.1 by 3.5 cm. Tiny 2-3 mm perifissural soft tissue nodule is seen in the right middle lobe, image 103/142, sequence 4. A second soft tissue nodule in the more inferior right middle lobe measures 3 mm, image 109/142, sequence 4. There are areas of linear atelectasis in the bilateral lung  bases and lingula. Musculoskeletal: No chest wall mass or suspicious bone lesions identified. CT ABDOMEN PELVIS FINDINGS Hepatobiliary: No focal liver abnormality is seen. Status post cholecystectomy. No biliary dilatation. Pancreas: Unremarkable. No pancreatic ductal dilatation  or surrounding inflammatory changes. Spleen: Normal in size without focal abnormality. Adrenals/Urinary Tract: 1.4 cm indeterminate right adrenal mass. The left adrenal gland is normal. The kidneys are normal. No left renal masses appreciated. No hydronephrosis or nephrolithiasis. There is a right peritrigonal bladder diverticulum. Stomach/Bowel: Stomach is within normal limits. Appendix appears normal. No evidence of bowel wall thickening, distention, or inflammatory changes. Left colonic diverticulosis without evidence of diverticulitis. Vascular/Lymphatic: Aortic atherosclerosis. No enlarged abdominal or pelvic lymph nodes. Reproductive: Enlarged prostate gland. Other: Bilateral fat containing inguinal hernias. Musculoskeletal: No acute osseous findings. Spondylosis of the spine. IMPRESSION: 1. 3.4 cm right lower lobe subpleural/peri diaphragmatic soft tissue pulmonary mass, highly suspicious for primary pulmonary malignancy or metastatic lesion. 2. Marked bilateral mediastinal and right hilar/peribronchial lymphadenopathy. 3. The mid esophagus is compressed or contiguous with the left paratracheal lymphadenopathy/mass. Esophageal involvement cannot be excluded. 4. 1.4 cm indeterminate right adrenal mass. 5. Left colonic diverticulosis without evidence of diverticulitis. 6. Bilateral fat containing inguinal hernias. 7. Enlarged prostate gland. Please correlate to serum PSA values. Aortic Atherosclerosis (ICD10-I70.0). These results will be called to the ordering clinician or representative by the Radiologist Assistant, and communication documented in the PACS or zVision Dashboard. Electronically Signed   By: Fidela Salisbury M.D.   On:  03/26/2019 14:00   Mr Jeri Cos [id number] Contrast  Result Date: 04/17/2019 CLINICAL DATA:  Metastatic melanoma. Preoperative planning for stereotactic radio surgery. EXAM: MRI HEAD WITHOUT AND WITH CONTRAST TECHNIQUE: Multiplanar, multiecho pulse sequences of the brain and surrounding structures were obtained without and with intravenous contrast. CONTRAST:  81mL MULTIHANCE GADOBENATE DIMEGLUMINE 529 MG/ML IV SOLN COMPARISON:  MRI head 03/26/2019 FINDINGS: Brain: Multiple enhancing lesions the brain compatible with widespread metastatic disease. These lesions show susceptibility compatible with prior hemorrhage/melanin content. Lesions are marked on the axial postcontrast images in PACS. 2 mm area of restricted diffusion right cerebellum without enhancement. This was not seen previously and could represent a small acute infarct versus early metastatic disease. 10 mm right occipital lesion with surrounding edema Punctate 2 mm enhancing lesion right occipital lesion with surrounding edema unchanged 20 mm cystic mass left occipital lobe with surrounding edema Complex hemorrhagic lesion right occipital parietal lobe measuring 43 x 37 mm similar to prior. Surrounding edema and local mass-effect. 11 mm left lateral temporal lesion 39mm enhancing lesion right frontal lobe Punctate 1.5 mm lesion right parietal cortex axial image 133 Punctate 1.5 x 2 mm enhancing lesion left frontal cortex axial image 140 Ventricle size normal.  Mild atrophy.  No midline shift. Vascular: Normal arterial flow voids. Skull and upper cervical spine: No skull lesion. Left frontal scalp resection over the convexity. Sinuses/Orbits: Mild mucosal edema paranasal sinuses. Bilateral cataract surgery Other: None IMPRESSION: At least 9 metastatic deposits in the brain as described above. Most of these show evidence of prior hemorrhage or melanin deposition due to metastatic melanoma. Largest lesion right occipital parietal lobe with surrounding edema and  local mass-effect. New punctate area of restricted diffusion right cerebellum without enhancement. This could represent acute infarct due to hypercoagulability or possibly early metastatic disease. Electronically Signed   By: Franchot Gallo M.D.   On: 04/17/2019 16:08   Mr Brain W And Wo Contrast  Result Date: 03/26/2019 CLINICAL DATA:  Follow-up examination for acute intracranial hemorrhage. Altered mental status. EXAM: MRI HEAD WITHOUT AND WITH CONTRAST TECHNIQUE: Multiplanar, multiecho pulse sequences of the brain and surrounding structures were obtained without and with intravenous contrast. CONTRAST:  10 cc of Gadavist. COMPARISON:  Prior CT from 03/25/2019. FINDINGS: Brain: Mild age-related cerebral atrophy with chronic microvascular ischemic disease. Few scattered superimposed remote lacunar infarcts noted within the right basal ganglia and left thalamus. Previously identified intraparenchymal hematoma centered at the right parieto-occipital region again seen, measuring approximately 4.1 x 4.3 x 3.9 cm (estimated volume 34 cc). Underlying metastatic lesion is presumably present. Surrounding vasogenic edema throughout the right parieto-occipital region with mild mass effect on the adjacent atrium of the right lateral ventricle. No definite intraventricular extension of hemorrhage. Associated trace right-to-left midline shift of the septum pellucidum. Basilar cisterns remain patent. Multiple additional scattered enhancing masses seen involving the bilateral cerebral hemispheres, all of which demonstrate associated susceptibility artifact, consistent with hemorrhagic metastases. These are seen as follows; punctate enhancing lesion at the posterior left centrum semi ovale (series 18, image 45), 8 mm enhancing lesion at the right parietal cortex (series 18, image 43), 5 mm lesion involving the cortical gray matter of the anterior left frontal lobe (series 18, image 42), 7 mm lesion along the cortex of the  anterior right frontal lobe (series 18, image 34), 11 mm dural-based lesion at the right occipital lobe (series 18, image 28), adjacent punctate 4 mm lesion (series 18, image 25), 14 mm lesion at the anterior left temporal lobe (series 18, image 25), 20 x 19 mm dural-based lesion at the left occipital lobe (series 18, image 23). Additional possible punctate lesion at the anterior temporal left temporal pole (series 18, image 18), not entirely certain given small size. Relative sparing of the brainstem and cerebellum. No evidence for acute infarct. No extra-axial fluid collection. No hydrocephalus or ventricular trapping. No other acute intracranial abnormality. Vascular: Major intracranial vascular flow voids are maintained. Skull and upper cervical spine: Craniocervical junction within normal limits. Degenerative spondylolysis noted at C2-3 without significant stenosis. No focal marrow replacing lesion. Postsurgical changes noted at the left frontal scalp. Sinuses/Orbits: Patient status post bilateral ocular lens replacement. Globes and orbital soft tissues demonstrate no acute finding. Scattered mucosal thickening noted within the ethmoidal air cells and maxillary sinuses with superimposed small maxillary sinus retention cyst. No mastoid effusion. Inner ear structures grossly normal. Other: None. IMPRESSION: 1. Multiple scattered intracranial hemorrhagic metastases as detailed above. One of these lesions at the parieto-occipital region has bled with associated 34 cc intraparenchymal hematoma. Associated regional mass effect with trace right-to-left midline shift. 2. Underlying age-related cerebral atrophy with mild chronic small vessel ischemic disease. Electronically Signed   By: Jeannine Boga M.D.   On: 03/26/2019 03:13   US Abdomen Complete  Result Date: 03/25/2019 CLINICAL DATA:  Abnormal LFTs. EXAM: ABDOMEN ULTRASOUND COMPLETE COMPARISON:  None. FINDINGS: Gallbladder: The gallbladder surgically  absent. Common bile duct: Diameter: 3 mm Liver: The liver echogenicity appears increased. The hepatic echotexture is coarsened and heterogeneous. Portal vein is patent on color Doppler imaging with normal direction of blood flow towards the liver. IVC: No abnormality visualized. Pancreas: The pancreas is poorly evaluated secondary to overlying bowel gas. Spleen: Size and appearance within normal limits. Right Kidney: Length: 9.3 cm. Echogenicity within normal limits. No mass or hydronephrosis visualized. Left Kidney: Length: 11 cm. There is a hypoechoic 3.3 x 2.9 by 3 cm mass in the interpolar region of the left kidney. This mass appears to demonstrate some internal color Doppler flow. There is no hydronephrosis. Abdominal aorta: No aneurysm visualized. Other findings: None. IMPRESSION: 1. Limited study secondary to overlying bowel gas and patient body habitus. 2. Status post cholecystectomy. 3. Coarsened heterogeneous appearance of  the liver is nonspecific and may be secondary to underlying hepatocellular disease or hepatic steatosis. 4. Indeterminate 3 cm mass involving the interpolar region of the left kidney. Follow-up with a nonemergent outpatient contrast enhanced CT or MRI is recommended for further evaluation of this finding. 5. Pancreas not well evaluated secondary to overlying bowel gas. Electronically Signed   By: Constance Holster M.D.   On: 03/25/2019 23:10   Ct Abdomen Pelvis W Contrast  Result Date: 03/26/2019 CLINICAL DATA:  Liver/biliary neoplasm. EXAM: CT CHEST, ABDOMEN, AND PELVIS WITH CONTRAST TECHNIQUE: Multidetector CT imaging of the chest, abdomen and pelvis was performed following the standard protocol during bolus administration of intravenous contrast. CONTRAST:  190mL ISOVUE-300 IOPAMIDOL (ISOVUE-300) INJECTION 61% COMPARISON:  Abdominal ultrasound dated 03/25/2019 FINDINGS: CT CHEST FINDINGS Cardiovascular: Normal heart size. No pericardial effusion. Calcific atherosclerotic disease  of the coronary arteries and to mild degree the aorta. Mediastinum/Nodes: Marked bilateral mediastinal lymphadenopathy with abnormal lymph nodes in left paratracheal, left prevascular, left precarinal, central subcarinal stations, as well as right hilar and peribronchial stations. Additional abnormal lymph node in left supra clavicular location. The largest conglomerate of abnormal appearing lymph nodes in the subcarinal region measures 2.9 x 5.4 cm. The trachea is patent. Lungs/Pleura: There is a large subpleural/peri diaphragmatic soft tissue mass in the right lower lobe measuring 3.4 by 4.1 by 3.5 cm. Tiny 2-3 mm perifissural soft tissue nodule is seen in the right middle lobe, image 103/142, sequence 4. A second soft tissue nodule in the more inferior right middle lobe measures 3 mm, image 109/142, sequence 4. There are areas of linear atelectasis in the bilateral lung bases and lingula. Musculoskeletal: No chest wall mass or suspicious bone lesions identified. CT ABDOMEN PELVIS FINDINGS Hepatobiliary: No focal liver abnormality is seen. Status post cholecystectomy. No biliary dilatation. Pancreas: Unremarkable. No pancreatic ductal dilatation or surrounding inflammatory changes. Spleen: Normal in size without focal abnormality. Adrenals/Urinary Tract: 1.4 cm indeterminate right adrenal mass. The left adrenal gland is normal. The kidneys are normal. No left renal masses appreciated. No hydronephrosis or nephrolithiasis. There is a right peritrigonal bladder diverticulum. Stomach/Bowel: Stomach is within normal limits. Appendix appears normal. No evidence of bowel wall thickening, distention, or inflammatory changes. Left colonic diverticulosis without evidence of diverticulitis. Vascular/Lymphatic: Aortic atherosclerosis. No enlarged abdominal or pelvic lymph nodes. Reproductive: Enlarged prostate gland. Other: Bilateral fat containing inguinal hernias. Musculoskeletal: No acute osseous findings. Spondylosis of  the spine. IMPRESSION: 1. 3.4 cm right lower lobe subpleural/peri diaphragmatic soft tissue pulmonary mass, highly suspicious for primary pulmonary malignancy or metastatic lesion. 2. Marked bilateral mediastinal and right hilar/peribronchial lymphadenopathy. 3. The mid esophagus is compressed or contiguous with the left paratracheal lymphadenopathy/mass. Esophageal involvement cannot be excluded. 4. 1.4 cm indeterminate right adrenal mass. 5. Left colonic diverticulosis without evidence of diverticulitis. 6. Bilateral fat containing inguinal hernias. 7. Enlarged prostate gland. Please correlate to serum PSA values. Aortic Atherosclerosis (ICD10-I70.0). These results will be called to the ordering clinician or representative by the Radiologist Assistant, and communication documented in the PACS or zVision Dashboard. Electronically Signed   By: Fidela Salisbury M.D.   On: 03/26/2019 14:00   Dg Chest Portable 1 View  Result Date: 03/25/2019 CLINICAL DATA:  Chest pain and shortness of breath. EXAM: PORTABLE CHEST 1 VIEW COMPARISON:  Chest x-ray dated June 13, 2011. FINDINGS: The heart size and mediastinal contours are within normal limits. Normal pulmonary vascularity. No focal consolidation, pleural effusion, or pneumothorax. Unchanged mild elevation of the left hemidiaphragm. No  acute osseous abnormality. IMPRESSION: No active disease. Electronically Signed   By: Titus Dubin M.D.   On: 03/25/2019 16:43   [place] Fna Soft Tissue  Result Date: 03/27/2019 INDICATION: No known primary, now with concern for metastatic lung cancer. Please perform ultrasound-guided biopsy of indeterminate left supraclavicular lymph node for tissue diagnostic purposes. EXAM: ULTRASOUND-GUIDED LEFT SUPRACLAVICULAR LYMPH NODE BIOPSY COMPARISON:  CT the chest, abdomen and pelvis-03/26/2019 MEDICATIONS: None ANESTHESIA/SEDATION: Moderate (conscious) sedation was employed during this procedure. A total of Versed 1 mg and Fentanyl 25  mcg was administered intravenously. Moderate Sedation Time: 10 minutes. The patient's level of consciousness and vital signs were monitored continuously by radiology nursing throughout the procedure under my direct supervision. COMPLICATIONS: None immediate. TECHNIQUE: Informed written consent was obtained from the patient after a discussion of the risks, benefits and alternatives to treatment. Questions regarding the procedure were encouraged and answered. Initial ultrasound scanning demonstrated an approximately 1.8 x 1.4 cm left supraclavicular lymph node (image 4) correlating with the lymph node seen on preceding CT scan image 14, series 3). An ultrasound image was saved for documentation purposes. The procedure was planned. A timeout was performed prior to the initiation of the procedure. The operative was prepped and draped in the usual sterile fashion, and a sterile drape was applied covering the operative field. A timeout was performed prior to the initiation of the procedure. Local anesthesia was provided with 1% lidocaine with epinephrine. Under direct ultrasound guidance, an 18 gauge core needle device was utilized to obtain to obtain 3 core needle biopsies of the indeterminate left supraclavicular lymph node. The samples were placed in saline and submitted to pathology. The needle was removed and hemostasis was achieved with manual compression. Post procedure scan was negative for significant hematoma. A dressing was placed. The patient tolerated the procedure well without immediate postprocedural complication. IMPRESSION: Technically successful ultrasound guided biopsy of dominant left supraclavicular lymph node. Electronically Signed   By: Sandi Mariscal M.D.   On: 03/27/2019 16:19      IMPRESSION/PLAN: 1. 78 y.o. gentleman with newly diagnosed metastatic melanoma with multiple brain metastases  Today, we talked to the patient and family about the findings and workup thus far. We discussed the  natural history of metastatic melanoma and general treatment, highlighting the role of radiotherapy in the management of brain metastases. We reviewed the results from his recent 3T brain MRI which confirms at least 9 metastatic brain lesions, with the largest in the right occipital parietal lobe, measuring 43 x 37 mm.    At this point, the patient would potentially benefit from radiotherapy. The options include whole brain irradiation versus stereotactic radiosurgery [facility]). There are pros and cons associated with each of these potential treatment options. Whole brain radiotherapy would treat the known metastatic deposits and help provide some reduction of risk for future brain metastases. However, whole brain radiotherapy carries potential risks including hair loss, subacute somnolence, and neurocognitive changes including a possible reduction in short-term memory. Whole brain radiotherapy also may carry a lower likelihood of tumor control at the treatment sites because of the low-dose used. Stereotactic radiosurgery [facility]) carries a higher likelihood for local tumor control at the targeted sites with lower associated risk for neurocognitive changes such as memory loss. However, the use of stereotactic radiosurgery [facility]) in this setting may leave the patient at increased risk for new brain metastases elsewhere in the brain as high as 50-60%. Accordingly, patients who receive stereotactic radiosurgery [facility]) in this setting should undergo ongoing  surveillance imaging with brain MRI more frequently in order to identify and treat new small brain metastases before they become symptomatic. Stereotactic radiosurgery does carry some different risks, including a risk of radionecrosis.  We discussed the recommendation to proceed with SRS treatment directed at the 9 known brain metastases.  At present, the plan is to treat with a single fraction of SRS but we did discuss the potential need for additional fractions to be  determined at the time of treatment planning. He will continue taking Dexamethasone 4mg  po TID up to the day of treatment with plans to gradually taper him off thereafter.  I will also send a Rx for magic mouthwash to help manage his suspected early thrush.  For the LE edema, he was encouraged to elevate his legs as much as possible when he is resting around the house.  He and his wife were encouraged to ask questions that were answered to their stated satisfaction.  At the end of our conversation, the patient would like to proceed with stereotactic radiosurgery [facility]). He will proceed to CT simulation later this morning, and SRS treatment is tentatively scheduled for 04/23/2019.  He knows to call with any questions or concerns in the interim. We will share our discussion and treatment plans with Dr. Earlie Server and move forward with treatment planning accordingly. He has a follow up visit with Dr. Julien Nordmann for further discussion of systemic treatment options on 04/28/19.  We enjoyed meeting him and his wife today and look forward to participating in his care.   Nicholos Johns, PA-C    Tyler Pita, MD  [facility] Direct Dial: [phone]   Fax: [phone] [email or web address]   Skype   [email or web address]  This document serves as a record of services personally performed by Tyler Pita, MD and Freeman Caldron, PA-C. It was created on their behalf by Wilburn Mylar, a trained medical scribe. The creation of this record is based on the scribe's personal observations and the provider's statements to them. This document has been checked and approved by the attending provider.

## 2019-04-17 NOTE — Telephone Encounter (Signed)
Received call from Manuela Schwartz in [place] re patient wife wanting to reschedule 9/29 appointments due to conflict. Spoke with wife and changed appointment from 9/29 to 10/5. Message to transportation. Per wife [facility] has been providing transport.

## 2019-04-18 ENCOUNTER — Telehealth: Payer: Self-pay | Admitting: Radiation Oncology

## 2019-04-18 ENCOUNTER — Encounter: Payer: Self-pay | Admitting: Urology

## 2019-04-18 ENCOUNTER — Other Ambulatory Visit: Payer: Self-pay | Admitting: Urology

## 2019-04-18 ENCOUNTER — Ambulatory Visit
Admission: RE | Admit: 2019-04-18 | Discharge: 2019-04-18 | Disposition: A | Discharge status: Home / Self Care | Payer: Medicare Other | Source: Ambulatory Visit | Attending: Radiation Oncology | Admitting: Radiation Oncology

## 2019-04-18 ENCOUNTER — Other Ambulatory Visit: Payer: Self-pay

## 2019-04-18 ENCOUNTER — Ambulatory Visit
Admission: RE | Admit: 2019-04-18 | Discharge: 2019-04-18 | Disposition: A | Discharge status: Home / Self Care | Payer: Medicare Other | Source: Ambulatory Visit | Attending: Urology | Admitting: Urology

## 2019-04-18 VITALS — BP 120/63 | HR 63 | Temp 98.5°F | Resp 18 | Wt 201.0 lb

## 2019-04-18 DIAGNOSIS — C7931 Secondary malignant neoplasm of brain: Secondary | ICD-10-CM

## 2019-04-18 DIAGNOSIS — H5462 Unqualified visual loss, left eye, normal vision right eye: Secondary | ICD-10-CM | POA: Diagnosis not present

## 2019-04-18 DIAGNOSIS — C434 Malignant melanoma of scalp and neck: Secondary | ICD-10-CM | POA: Diagnosis not present

## 2019-04-18 DIAGNOSIS — Z85820 Personal history of malignant melanoma of skin: Secondary | ICD-10-CM | POA: Diagnosis not present

## 2019-04-18 DIAGNOSIS — Z510 Encounter for antineoplastic radiation therapy: Secondary | ICD-10-CM | POA: Diagnosis not present

## 2019-04-18 DIAGNOSIS — I69198 Other sequelae of nontraumatic intracerebral hemorrhage: Secondary | ICD-10-CM | POA: Diagnosis not present

## 2019-04-18 DIAGNOSIS — C7932 Secondary malignant neoplasm of cerebral meninges: Secondary | ICD-10-CM | POA: Diagnosis not present

## 2019-04-18 DIAGNOSIS — C439 Malignant melanoma of skin, unspecified: Secondary | ICD-10-CM | POA: Diagnosis not present

## 2019-04-18 MED ORDER — DEXAMETHASONE 4 MG PO TABS: 4.0000 mg | ORAL_TABLET | Freq: Three times a day (TID) | ORAL | 0 refills | Status: DC

## 2019-04-18 MED ORDER — SODIUM CHLORIDE 0.9% FLUSH
10.0000 mL | Freq: Once | INTRAVENOUS | Status: AC
  Administered 2019-04-18: 10 mL via INTRAVENOUS

## 2019-04-18 MED ORDER — MAGIC MOUTHWASH W/LIDOCAINE: 5.0000 mL | Freq: Four times a day (QID) | ORAL | 1 refills | Status: DC | PRN

## 2019-04-18 NOTE — Telephone Encounter (Signed)
-----   Message from Freeman Caldron, [place] sent at 04/18/2019 10:21 AM EDT ----- Regarding: Please call in Rx for Magic Mouthwash to patient's pharmacy I entered the order for magic mouthwash but this Rx has to be called to the pharmacy- cannot e-scribe.  Please phone this in to his pharmacy (instructions for mixture are listed in pharmacy notes in the order)- [facility] at [address]. Thank you!!!! -Ashlyn

## 2019-04-18 NOTE — Telephone Encounter (Signed)
As requested by Freeman Caldron, PA-C this RN called in [place] with Lidocaine to [place] at [address] and [address]. Left voicemail message on providers voicemail.

## 2019-04-18 NOTE — Progress Notes (Signed)
Has armband been applied?  yes  Does patient have an allergy to IV contrast dye?: no   Has patient ever received premedication for IV contrast dye?:   No  Does patient take metformin?: no  If patient does take metformin when was the last dose: no  Date of lab work 04-05-2019 BUN: 32 CR: 0.96  IV site:   Has IV site been added to flowsheet?  yes  Vitals:   04/18/19 0823  BP: 120/63  Pulse: 63  Resp: 18  Temp: 98.5 F (36.9 C)  TempSrc: Oral  SpO2: 98%  Weight: 201 lb (91.2 kg)

## 2019-04-20 NOTE — Progress Notes (Signed)
  Radiation Oncology         [phone]) [phone] ________________________________  Name: Randall Mcgee MRN: [id number]  Date: 04/18/2019  DOB: 05-May-1941  SIMULATION AND TREATMENT PLANNING NOTE    ICD-10-CM   1. Brain metastases ([place])  C79.31     DIAGNOSIS:  78 yo man with multiple brain metastases from melanoma  NARRATIVE:  The patient was brought to the [facility].  Identity was confirmed.  All relevant records and images related to the planned course of therapy were reviewed.  The patient freely provided informed written consent to proceed with treatment after reviewing the details related to the planned course of therapy. The consent form was witnessed and verified by the simulation staff. Intravenous access was established for contrast administration. Then, the patient was set-up in a stable reproducible supine position for radiation therapy.  A relocatable thermoplastic stereotactic head frame was fabricated for precise immobilization.  CT images were obtained.  Surface markings were placed.  The CT images were loaded into the planning software and fused with the patient's targeting MRI scan.  Then the target and avoidance structures were contoured.  Treatment planning then occurred.  The radiation prescription was entered and confirmed.  I have requested 3D planning  I have requested a DVH of the following structures: Brain stem, brain, left eye, right eye, lenses, optic chiasm, target volumes, uninvolved brain, and normal tissue.    SPECIAL TREATMENT PROCEDURE:  The planned course of therapy using radiation constitutes a special treatment procedure. Special care is required in the management of this patient for the following reasons. This treatment constitutes a Special Treatment Procedure for the following reason: High dose per fraction requiring special monitoring for increased toxicities of treatment including daily imaging.  The special nature of the planned course of  radiotherapy will require increased physician supervision and oversight to ensure patient's safety with optimal treatment outcomes.  PLAN:  The patient will receive 20 Gy in 1 fraction to the smaller metastases and 27 Gy in 3 fractions to the larger metastasis.  ________________________________  Sheral Apley Tammi Klippel, M.D.

## 2019-04-21 DIAGNOSIS — C7931 Secondary malignant neoplasm of brain: Secondary | ICD-10-CM | POA: Diagnosis not present

## 2019-04-21 DIAGNOSIS — I69198 Other sequelae of nontraumatic intracerebral hemorrhage: Secondary | ICD-10-CM | POA: Diagnosis not present

## 2019-04-21 DIAGNOSIS — C434 Malignant melanoma of scalp and neck: Secondary | ICD-10-CM | POA: Diagnosis not present

## 2019-04-21 DIAGNOSIS — C7932 Secondary malignant neoplasm of cerebral meninges: Secondary | ICD-10-CM | POA: Diagnosis not present

## 2019-04-21 DIAGNOSIS — H5462 Unqualified visual loss, left eye, normal vision right eye: Secondary | ICD-10-CM | POA: Diagnosis not present

## 2019-04-22 ENCOUNTER — Other Ambulatory Visit: Payer: Medicare Other

## 2019-04-22 ENCOUNTER — Ambulatory Visit: Payer: Self-pay

## 2019-04-22 ENCOUNTER — Ambulatory Visit: Payer: Medicare Other | Admitting: Physician Assistant

## 2019-04-22 ENCOUNTER — Ambulatory Visit (HOSPITAL_COMMUNITY): Payer: Medicare Other

## 2019-04-22 DIAGNOSIS — C7931 Secondary malignant neoplasm of brain: Secondary | ICD-10-CM | POA: Diagnosis not present

## 2019-04-22 DIAGNOSIS — C439 Malignant melanoma of skin, unspecified: Secondary | ICD-10-CM | POA: Diagnosis not present

## 2019-04-22 DIAGNOSIS — Z510 Encounter for antineoplastic radiation therapy: Secondary | ICD-10-CM | POA: Diagnosis not present

## 2019-04-22 NOTE — Telephone Encounter (Signed)
  Reason for Disposition . Caller has medication question only, adult not sick, and triager answers question  Answer Assessment - Initial Assessment Questions 1.   NAME of MEDICATION: "What medicine are you calling about?"   Inderal 2.   QUESTION: "What is your question?"   What is the proper dosage 3.   PRESCRIBING HCP: "Who prescribed it?" Reason: if prescribed by specialist, call should be referred to that group.    Dr.  Jerilee Hoh 4. SYMPTOMS: "Do you have any symptoms?"     denies 5. SEVERITY: If symptoms are present, ask "Are they mild, moderate or severe?"  na 6.  PREGNANCY:  "Is there any chance that you are pregnant?" "When was your last menstrual period?"     na  Protocols used: MEDICATION QUESTION CALL-A-AH

## 2019-04-22 NOTE — Telephone Encounter (Signed)
Incoming call from Patient wife Unsure of medication dosage . Reviewed Patient med list on chart.    Provided to Pt wife the correct dosage and frequency. Patient wife voiced understanding.

## 2019-04-23 ENCOUNTER — Ambulatory Visit: Payer: Medicare Other | Admitting: Radiation Oncology

## 2019-04-23 ENCOUNTER — Ambulatory Visit: Payer: Medicare Other

## 2019-04-24 ENCOUNTER — Ambulatory Visit: Payer: Medicare Other | Admitting: Radiation Oncology

## 2019-04-24 ENCOUNTER — Ambulatory Visit (INDEPENDENT_AMBULATORY_CARE_PROVIDER_SITE_OTHER): Payer: Medicare Other | Admitting: Internal Medicine

## 2019-04-24 ENCOUNTER — Telehealth: Payer: Self-pay | Admitting: Internal Medicine

## 2019-04-24 ENCOUNTER — Encounter: Payer: Self-pay | Admitting: Internal Medicine

## 2019-04-24 ENCOUNTER — Other Ambulatory Visit: Payer: Self-pay

## 2019-04-24 VITALS — BP 124/80 | HR 61 | Temp 97.7°F

## 2019-04-24 DIAGNOSIS — B356 Tinea cruris: Secondary | ICD-10-CM | POA: Diagnosis not present

## 2019-04-24 DIAGNOSIS — J029 Acute pharyngitis, unspecified: Secondary | ICD-10-CM | POA: Diagnosis not present

## 2019-04-24 MED ORDER — NYSTATIN 100000 UNIT/GM EX POWD: Freq: Two times a day (BID) | CUTANEOUS | 2 refills | Status: AC

## 2019-04-24 MED ORDER — MAGIC MOUTHWASH W/LIDOCAINE: 5.0000 mL | Freq: Four times a day (QID) | ORAL | 2 refills | Status: DC | PRN

## 2019-04-24 NOTE — Telephone Encounter (Signed)
Unable to reach [place].  Left detailed message on machine with Rx and directions.

## 2019-04-24 NOTE — Patient Instructions (Signed)
-  Nice seeing you today!!  -Nystatin powder to groin twice daily to clean, dry area.  -Magic mouthwash with lidocaine up to 4 times daily as needed for sore throat.   Jock Itch  Jock itch is an itchy rash in the groin and upper thigh area. It is a skin infection that is caused by a type of germ that lives in dark, damp places (fungus). The rash usually goes away in 2-3 weeks with treatment. Follow these instructions at home: Skin care  Use skin creams, ointments, or powders exactly as told by your doctor.  Wear loose-fitting clothes. Clothes should not rub against your groin area. Men should wear boxer shorts or loose-fitting underwear.  Keep your groin area clean and dry. ? Change your underwear every day. ? Change out of wet bathing suits as soon as you can. ? After bathing, use a separate towel to dry your groin area. Dry the area gently and completely.  Avoid hot baths and showers. Hot water can make itching worse.  Do not scratch the area. General instructions  Take and apply over-the-counter and prescription medicines only as told by your doctor.  Do not share towels or clothing with other people.  Wash your hands often with soap and water, especially after touching your groin area. If you do not have soap and water, use alcohol-based hand sanitizer. Contact a doctor if:  Your rash: ? Gets worse. ? Does not get better after 2 weeks of treatment. ? Spreads. ? Comes back after treatment is done.  You have any of the following: ? A fever. ? New or worsening redness, swelling, or pain around your rash. ? Fluid, blood, or pus coming from your rash. Summary  Jock itch is an itchy rash. It affects the groin and upper thigh area.  Jock itch usually goes away in 2-3 weeks with treatment.  Keep your groin area clean and dry. This information is not intended to replace advice given to you by your health care provider. Make sure you discuss any questions you have with your  health care provider. Document Released: 10/04/2009 Document Revised: 11/22/2016 Document Reviewed: 06/20/2017 Elsevier Patient Education  2020 [employer].

## 2019-04-24 NOTE — Telephone Encounter (Signed)
Randall Mcgee, with [facility], requesting call back for directions for Magic Mouthwash that was sent over today.

## 2019-04-24 NOTE — Progress Notes (Signed)
Acute Office Visit     CC/Reason for Visit: Rash in groin, sore throat  HPI: ICHAEL Mcgee is a 78 y.o. male who is coming in today for the above mentioned reasons.  Since I saw him last week, he has had simulation for his radiation therapy that starts tomorrow.  He comes in today because for the past 3 to 4 days he has been having a red, scaly pruritic rash on both groins.  He has been using over-the-counter antibiotic ointment with some relief but not completely.  He remains on high-dose steroids for his metastatic melanoma to the brain.  He has also developed some sore throat and was told he might have thrush and wanted me to look at it.   Past Medical/Surgical History: Past Medical History:  Diagnosis Date   Anxiety    Cancer ([place]) 2017   melanoma on scalp   History of chicken pox    Hyperlipidemia    Hypertension    Right inguinal hernia     Past Surgical History:  Procedure Laterality Date   APPLICATION OF A-CELL OF EXTREMITY N/A 07/13/2016   Procedure: APPLICATION OF A-CELL;  Surgeon: Wallace Going, DO;  Location: [place];  Service: Plastics;  Laterality: N/A;   APPLICATION OF A-CELL OF HEAD/NECK N/A 06/28/2016   Procedure: APPLICATION OF A-CELL OF HEAD/NECK;  Surgeon: Wallace Going, DO;  Location: [place];  Service: Plastics;  Laterality: N/A;   APPLICATION OF A-CELL OF HEAD/NECK N/A 07/26/2016   Procedure: APPLICATION OF ACELL OF HEAD;  Surgeon: Wallace Going, DO;  Location: [place];  Service: Plastics;  Laterality: N/A;   APPLICATION OF A-CELL OF HEAD/NECK N/A 08/31/2016   Procedure: APPLICATION OF A-CELL OF HEAD/NECK;  Surgeon: Wallace Going, DO;  Location: [place];  Service: Plastics;  Laterality: N/A;   CATARACT EXTRACTION, BILATERAL     CHOLECYSTECTOMY     ENDARTERECTOMY  06/20/11   ENDARTERECTOMY  06/20/2011   Procedure: ENDARTERECTOMY CAROTID;   Surgeon: Elam Dutch, MD;  Location: [facility] OR;  Service: Vascular;  Laterality: Right;  Right Carotid endarterectomy with dacron patch angioplasty    INGUINAL HERNIA REPAIR Right 08/16/2018   Procedure: RIGHT INGUINAL HERNIA REPAIR WITH MESH;  Surgeon: Georganna Skeans, MD;  Location: [place];  Service: General;  Laterality: Right;   MASS EXCISION N/A 06/28/2016   Procedure: EXCISION SCALP MELANOMA;  Surgeon: Wallace Going, DO;  Location: [place];  Service: Plastics;  Laterality: N/A;   MASS EXCISION N/A 07/13/2016   Procedure: RE- EXCISION OF SCALP MELANOMA;  Surgeon: Wallace Going, DO;  Location: [place];  Service: Plastics;  Laterality: N/A;   MASS EXCISION N/A 07/26/2016   Procedure: RE-EXCISION OF SCALP MELANOMA FOR POSITIVE MARGAIN;  Surgeon: Wallace Going, DO;  Location: [place];  Service: Plastics;  Laterality: N/A;   SKIN SPLIT GRAFT Left 08/31/2016   Procedure: SKIN GRAFT SPLIT THICKNESS TO HIS SCALP FROM HIS THIGH;  Surgeon: Wallace Going, DO;  Location: [place];  Service: Plastics;  Laterality: Left;   TONSILLECTOMY     TONSILLECTOMY      Social History:  reports that he has never smoked. He has never used smokeless tobacco. He reports that he does not drink alcohol or use drugs.  Allergies: No Known Allergies  Family History:  Family History  Problem Relation Age of Onset  Heart disease Mother      Current Outpatient Medications:    acetaminophen (TYLENOL) 325 MG tablet, Take 2 tablets (650 mg total) by mouth every 6 (six) hours as needed for mild pain or headache., Disp:  , Rfl:    amLODipine (NORVASC) 5 MG tablet, Take 1 tablet (5 mg total) by mouth daily., Disp: 30 tablet, Rfl: 0   cetirizine (ZYRTEC) 10 MG tablet, Take 1 tablet (10 mg total) by mouth daily., Disp: 30 tablet, Rfl: 11   dexamethasone (DECADRON) 4 MG tablet, Take 1 tablet (4 mg total)  by mouth every 8 (eight) hours for 14 days., Disp: 42 tablet, Rfl: 0   lisinopril (PRINIVIL,ZESTRIL) 40 MG tablet, Take 1 tablet (40 mg total) by mouth daily., Disp: 90 tablet, Rfl: 3   magic mouthwash w/lidocaine SOLN, Take 5 mLs by mouth 4 (four) times daily as needed for mouth pain (swish, gargle and spit out or swallow, prior to meals and at bedtime)., Disp: 240 mL, Rfl: 1   Multiple Vitamins-Minerals (CENTRUM SILVER PO), Take 1 tablet by mouth daily. , Disp: , Rfl:    propranolol (INDERAL) 20 MG tablet, Take 40 mg by mouth 2 (two) times daily., Disp: , Rfl:    vitamin B-12 (CYANOCOBALAMIN) 1000 MCG tablet, Take 1 tablet (1,000 mcg total) by mouth daily., Disp: , Rfl:    magic mouthwash w/lidocaine SOLN, Take 5 mLs by mouth 4 (four) times daily as needed for mouth pain., Disp: 500 mL, Rfl: 2   nystatin (MYCOSTATIN/NYSTOP) powder, Apply topically 2 (two) times daily. To groin, Disp: 15 g, Rfl: 2  Review of Systems:  Constitutional: Denies fever, chills, diaphoresis, appetite change and fatigue.  HEENT: Denies photophobia, eye pain, redness, hearing loss, ear pain, congestion,, rhinorrhea, sneezing, mouth sores,  neck pain, neck stiffness and tinnitus.   Respiratory: Denies SOB, DOE, cough, chest tightness,  and wheezing.   Cardiovascular: Denies chest pain, palpitations and leg swelling.  Gastrointestinal: Denies nausea, vomiting, abdominal pain, diarrhea, constipation, blood in stool and abdominal distention.  Genitourinary: Denies dysuria, urgency, frequency, hematuria, flank pain and difficulty urinating.  Endocrine: Denies: hot or cold intolerance, sweats, changes in hair or nails, polyuria, polydipsia. Musculoskeletal: Denies myalgias, back pain, joint swelling, arthralgias and gait problem.  Skin: Denies pallor, rash and wound.  Neurological: Denies dizziness, seizures, syncope, weakness, light-headedness, numbness and headaches.  Hematological: Denies adenopathy. Easy bruising,  personal or family bleeding history  Psychiatric/Behavioral: Denies suicidal ideation, mood changes, confusion, nervousness, sleep disturbance and agitation    Physical Exam: Vitals:   04/24/19 1118  BP: 124/80  Pulse: 61  Temp: 97.7 F (36.5 C)  TempSrc: Temporal  SpO2: 96%    There is no height or weight on file to calculate BMI.   Constitutional: NAD, calm, comfortable Eyes: PERRL, lids and conjunctivae normal ENMT: Mucous membranes are moist. Posterior pharynx clear of any exudate or lesions no evidence of thrush, pharyngeal erythema. Respiratory: clear to auscultation bilaterally, no wheezing, no crackles. Normal respiratory effort. No accessory muscle use.  Cardiovascular: Regular rate and rhythm, no murmurs / rubs / gallops. No extremity edema. 2+ pedal pulses. No carotid bruits.  Skin: Red scaly rash of both groins Neurologic: Generally weak but nonfocal.  Psychiatric: Normal judgment and insight. Alert and oriented x 3. Normal mood.    Impression and Plan:  Jock itch  -He appears to have a yeast infection of his groin, likely precipitated by high-dose steroid usage. -Nystatin powder prescribed.  Sore throat  -No thrush  evident on exam today. -Nonetheless, his throat is erythematous, wonder about acute URI versus steroid effect. -Magic mouthwash with lidocaine that he can use up to 4 times daily as needed.    Patient Instructions  -Nice seeing you today!!  -Nystatin powder to groin twice daily to clean, dry area.  -Magic mouthwash with lidocaine up to 4 times daily as needed for sore throat.   Jock Itch  Jock itch is an itchy rash in the groin and upper thigh area. It is a skin infection that is caused by a type of germ that lives in dark, damp places (fungus). The rash usually goes away in 2-3 weeks with treatment. Follow these instructions at home: Skin care  Use skin creams, ointments, or powders exactly as told by your doctor.  Wear loose-fitting  clothes. Clothes should not rub against your groin area. Men should wear boxer shorts or loose-fitting underwear.  Keep your groin area clean and dry. ? Change your underwear every day. ? Change out of wet bathing suits as soon as you can. ? After bathing, use a separate towel to dry your groin area. Dry the area gently and completely.  Avoid hot baths and showers. Hot water can make itching worse.  Do not scratch the area. General instructions  Take and apply over-the-counter and prescription medicines only as told by your doctor.  Do not share towels or clothing with other people.  Wash your hands often with soap and water, especially after touching your groin area. If you do not have soap and water, use alcohol-based hand sanitizer. Contact a doctor if:  Your rash: ? Gets worse. ? Does not get better after 2 weeks of treatment. ? Spreads. ? Comes back after treatment is done.  You have any of the following: ? A fever. ? New or worsening redness, swelling, or pain around your rash. ? Fluid, blood, or pus coming from your rash. Summary  Jock itch is an itchy rash. It affects the groin and upper thigh area.  Jock itch usually goes away in 2-3 weeks with treatment.  Keep your groin area clean and dry. This information is not intended to replace advice given to you by your health care provider. Make sure you discuss any questions you have with your health care provider. Document Released: 10/04/2009 Document Revised: 07-15-202018 Document Reviewed: 06/20/2017 Elsevier Patient Education  2020 Carl, MD [facility]

## 2019-04-25 ENCOUNTER — Other Ambulatory Visit: Payer: Self-pay

## 2019-04-25 ENCOUNTER — Ambulatory Visit
Admission: RE | Admit: 2019-04-25 | Discharge: 2019-04-25 | Disposition: A | Discharge status: Home / Self Care | Payer: Medicare Other | Source: Ambulatory Visit | Attending: Radiation Oncology | Admitting: Radiation Oncology

## 2019-04-25 ENCOUNTER — Telehealth: Payer: Self-pay

## 2019-04-25 DIAGNOSIS — Z510 Encounter for antineoplastic radiation therapy: Secondary | ICD-10-CM | POA: Insufficient documentation

## 2019-04-25 DIAGNOSIS — C7931 Secondary malignant neoplasm of brain: Secondary | ICD-10-CM | POA: Diagnosis not present

## 2019-04-25 DIAGNOSIS — C439 Malignant melanoma of skin, unspecified: Secondary | ICD-10-CM | POA: Insufficient documentation

## 2019-04-25 MED ORDER — MAGIC MOUTHWASH W/LIDOCAINE: ORAL | 0 refills | Status: DC

## 2019-04-25 NOTE — Telephone Encounter (Signed)
Copied from [place] [id number]. Topic: General - Other >> Apr 25, 2019 12:01 PM Yvette Rack wrote: Reason for CRM: [name] with [facility] stated the Rx for magic mouthwash w/lidocaine SOLN did not include the ingredients the provider requests to be in the mouthwash.

## 2019-04-25 NOTE — Telephone Encounter (Signed)
New Rx faxed and confirmed

## 2019-04-25 NOTE — Progress Notes (Signed)
Nurse monitoring following SRS treatment complete. Vitals stable. Denies pain. Denies any new neurologic symptoms. No distress noted. Instructed patient to avoid strenuous activity for the next 24 hours and contact [phone] with needs related to today's treatment. Wheeled patient out in wheelchair. Discharged home in no distress.

## 2019-04-28 ENCOUNTER — Inpatient Hospital Stay: Payer: Medicare Other

## 2019-04-28 ENCOUNTER — Ambulatory Visit: Payer: Medicare Other | Admitting: Radiation Oncology

## 2019-04-28 ENCOUNTER — Telehealth: Payer: Self-pay | Admitting: Radiation Therapy

## 2019-04-28 ENCOUNTER — Ambulatory Visit: Payer: Medicare Other | Admitting: Physician Assistant

## 2019-04-28 NOTE — Telephone Encounter (Signed)
Called to check on Mr. Norred when he did not arrive for his lab appointment today. There was an issue with the transportation that was set up due to his wife being admitted and unable to confirm the visits for this week.   The missed lab visit and treatment from today have been rescheduled and transportation confirmed for the rest of the week. I spoke with his son about the rescheduled visits because the patient was taking a nap when I called. He has these written down and was thankful for the call.   Mont Dutton R.T.(R)(T) Radiation Special Procedures Navigator

## 2019-04-29 ENCOUNTER — Other Ambulatory Visit: Payer: Medicare Other

## 2019-04-29 ENCOUNTER — Other Ambulatory Visit: Payer: Self-pay | Admitting: Oncology

## 2019-04-29 ENCOUNTER — Inpatient Hospital Stay: Payer: Medicare Other | Admitting: Physician Assistant

## 2019-04-29 ENCOUNTER — Telehealth: Payer: Self-pay | Admitting: Internal Medicine

## 2019-04-29 DIAGNOSIS — C434 Malignant melanoma of scalp and neck: Secondary | ICD-10-CM

## 2019-04-29 NOTE — Progress Notes (Signed)
Patient missed lab and visit today with Medical Oncology. Wife currently admitted to the hospital and unable to assist with getting him to appts. I spoke with his wife and she asked for me to reschedule appts for him. She is unable to assist in getting him to these appointments right now. I have placed a SW consult to help Mr. Tham and his family with coming to appointments, exploring transportation needs, and offering any other assistance that they may need.  Mikey Bussing, DNP, AGPCNP-BC, AOCNP

## 2019-04-29 NOTE — Telephone Encounter (Signed)
Scheduled appt per 10/6 sch message- pt son aware of appt date and time

## 2019-04-30 ENCOUNTER — Telehealth: Payer: Self-pay | Admitting: *Deleted

## 2019-04-30 ENCOUNTER — Ambulatory Visit
Admission: RE | Admit: 2019-04-30 | Discharge: 2019-04-30 | Disposition: A | Discharge status: Home / Self Care | Payer: Medicare Other | Source: Ambulatory Visit | Attending: Radiation Oncology | Admitting: Radiation Oncology

## 2019-04-30 ENCOUNTER — Other Ambulatory Visit: Payer: Self-pay

## 2019-04-30 DIAGNOSIS — C439 Malignant melanoma of skin, unspecified: Secondary | ICD-10-CM | POA: Diagnosis not present

## 2019-04-30 DIAGNOSIS — Z510 Encounter for antineoplastic radiation therapy: Secondary | ICD-10-CM | POA: Diagnosis not present

## 2019-04-30 DIAGNOSIS — C7931 Secondary malignant neoplasm of brain: Secondary | ICD-10-CM | POA: Diagnosis not present

## 2019-04-30 NOTE — Telephone Encounter (Signed)
[place] Work  Clinical Social Work was referred by Mikey Bussing, NP, for assessment of psychosocial needs.  Clinical Social Worker contacted patient by phone  to offer support and assess for needs.  Mr. Neustadt reported his spouse is in the hospital and his granddaughter is currently at home with him.    He indicated transportation is confirmed for today.  CSW will follow up with patient to continue to support through cancer experience.      Gwinda Maine, LCSW  Clinical Social Worker [facility]

## 2019-04-30 NOTE — Progress Notes (Signed)
Nurse monitoring following SRS treatment complete. Vitals stable. Denies pain. Denies any new neurologic symptoms. No distress noted. Instructed patient to avoid strenuous activity for the next 24 hours and contact [phone] with needs related to today's treatment. Wheeled patient out in wheelchair. Discharged home in no distress. Patient picked up today by [facility].   BP (!) 142/81   Pulse 68   Temp 98.7 F (37.1 C)   Resp 18   SpO2 98%

## 2019-05-01 DIAGNOSIS — C7932 Secondary malignant neoplasm of cerebral meninges: Secondary | ICD-10-CM | POA: Diagnosis not present

## 2019-05-01 DIAGNOSIS — I69198 Other sequelae of nontraumatic intracerebral hemorrhage: Secondary | ICD-10-CM | POA: Diagnosis not present

## 2019-05-01 DIAGNOSIS — C434 Malignant melanoma of scalp and neck: Secondary | ICD-10-CM | POA: Diagnosis not present

## 2019-05-01 DIAGNOSIS — H5462 Unqualified visual loss, left eye, normal vision right eye: Secondary | ICD-10-CM | POA: Diagnosis not present

## 2019-05-01 DIAGNOSIS — C7931 Secondary malignant neoplasm of brain: Secondary | ICD-10-CM | POA: Diagnosis not present

## 2019-05-02 ENCOUNTER — Ambulatory Visit
Admission: RE | Admit: 2019-05-02 | Discharge: 2019-05-02 | Disposition: A | Discharge status: Home / Self Care | Payer: Medicare Other | Source: Ambulatory Visit | Attending: Radiation Oncology | Admitting: Radiation Oncology

## 2019-05-02 ENCOUNTER — Encounter: Payer: Self-pay | Admitting: Radiation Oncology

## 2019-05-02 ENCOUNTER — Other Ambulatory Visit: Payer: Self-pay

## 2019-05-02 DIAGNOSIS — Z510 Encounter for antineoplastic radiation therapy: Secondary | ICD-10-CM | POA: Diagnosis not present

## 2019-05-02 DIAGNOSIS — C439 Malignant melanoma of skin, unspecified: Secondary | ICD-10-CM | POA: Diagnosis not present

## 2019-05-02 DIAGNOSIS — C7931 Secondary malignant neoplasm of brain: Secondary | ICD-10-CM

## 2019-05-02 MED ORDER — DEXAMETHASONE 4 MG PO TABS: 4.0000 mg | ORAL_TABLET | Freq: Two times a day (BID) | ORAL | 0 refills | Status: AC

## 2019-05-02 MED ORDER — MAGIC MOUTHWASH W/LIDOCAINE: ORAL | 0 refills | Status: DC

## 2019-05-05 ENCOUNTER — Inpatient Hospital Stay (HOSPITAL_BASED_OUTPATIENT_CLINIC_OR_DEPARTMENT_OTHER): Payer: Medicare Other | Admitting: Internal Medicine

## 2019-05-05 ENCOUNTER — Encounter: Payer: Self-pay | Admitting: Internal Medicine

## 2019-05-05 ENCOUNTER — Inpatient Hospital Stay: Payer: Medicare Other | Attending: Internal Medicine

## 2019-05-05 ENCOUNTER — Other Ambulatory Visit: Payer: Self-pay

## 2019-05-05 ENCOUNTER — Telehealth: Payer: Self-pay | Admitting: Internal Medicine

## 2019-05-05 VITALS — BP 123/74 | HR 69 | Temp 98.2°F | Resp 18 | Ht 71.0 in | Wt 198.2 lb

## 2019-05-05 DIAGNOSIS — C439 Malignant melanoma of skin, unspecified: Secondary | ICD-10-CM

## 2019-05-05 DIAGNOSIS — B370 Candidal stomatitis: Secondary | ICD-10-CM | POA: Diagnosis not present

## 2019-05-05 DIAGNOSIS — Z7189 Other specified counseling: Secondary | ICD-10-CM

## 2019-05-05 DIAGNOSIS — C7801 Secondary malignant neoplasm of right lung: Secondary | ICD-10-CM | POA: Insufficient documentation

## 2019-05-05 DIAGNOSIS — Z5112 Encounter for antineoplastic immunotherapy: Secondary | ICD-10-CM | POA: Diagnosis not present

## 2019-05-05 DIAGNOSIS — C7931 Secondary malignant neoplasm of brain: Secondary | ICD-10-CM | POA: Insufficient documentation

## 2019-05-05 DIAGNOSIS — C778 Secondary and unspecified malignant neoplasm of lymph nodes of multiple regions: Secondary | ICD-10-CM | POA: Insufficient documentation

## 2019-05-05 DIAGNOSIS — C434 Malignant melanoma of scalp and neck: Secondary | ICD-10-CM | POA: Insufficient documentation

## 2019-05-05 LAB — CMP (CANCER CENTER ONLY)
ALT: 73 U/L — ABNORMAL HIGH (ref 0–44)
AST: 38 U/L (ref 15–41)
Albumin: 2.5 g/dL — ABNORMAL LOW (ref 3.5–5.0)
Alkaline Phosphatase: 41 U/L (ref 38–126)
Anion gap: 7 (ref 5–15)
BUN: 26 mg/dL — ABNORMAL HIGH (ref 8–23)
CO2: 28 mmol/L (ref 22–32)
Calcium: 8.4 mg/dL — ABNORMAL LOW (ref 8.9–10.3)
Chloride: 103 mmol/L (ref 98–111)
Creatinine: 0.93 mg/dL (ref 0.61–1.24)
GFR, Est AFR Am: >60 mL/min (ref >60)
GFR, Estimated: >60 mL/min (ref >60)
Glucose, Bld: 333 mg/dL — ABNORMAL HIGH (ref 70–99)
Potassium: 4.5 mmol/L (ref 3.5–5.1)
Sodium: 138 mmol/L (ref 135–145)
Total Bilirubin: 0.4 mg/dL (ref 0.3–1.2)
Total Protein: 5 g/dL — ABNORMAL LOW (ref 6.5–8.1)

## 2019-05-05 LAB — CBC WITH DIFFERENTIAL (CANCER CENTER ONLY)
Abs Immature Granulocytes: 0.28 10*3/uL — ABNORMAL HIGH (ref 0.00–0.07)
Basophils Absolute: 0 10*3/uL (ref 0.0–0.1)
Basophils Relative: 0 %
Eosinophils Absolute: 0 10*3/uL (ref 0.0–0.5)
Eosinophils Relative: 0 %
HCT: 37.5 % — ABNORMAL LOW (ref 39.0–52.0)
Hemoglobin: 12.6 g/dL — ABNORMAL LOW (ref 13.0–17.0)
Immature Granulocytes: 4 %
Lymphocytes Relative: 11 %
Lymphs Abs: 0.7 10*3/uL (ref 0.7–4.0)
MCH: 32.8 pg (ref 26.0–34.0)
MCHC: 33.6 g/dL (ref 30.0–36.0)
MCV: 97.7 fL (ref 80.0–100.0)
Monocytes Absolute: 0.4 10*3/uL (ref 0.1–1.0)
Monocytes Relative: 5 %
Neutro Abs: 5.2 10*3/uL (ref 1.7–7.7)
Neutrophils Relative %: 80 %
Platelet Count: 102 10*3/uL — ABNORMAL LOW (ref 150–400)
RBC: 3.84 MIL/uL — ABNORMAL LOW (ref 4.22–5.81)
RDW: 13.2 % (ref 11.5–15.5)
WBC Count: 6.6 10*3/uL (ref 4.0–10.5)
nRBC: 0 % (ref 0.0–0.2)

## 2019-05-05 MED ORDER — FLUCONAZOLE 100 MG PO TABS: 100.0000 mg | ORAL_TABLET | Freq: Every day | ORAL | 0 refills | Status: AC

## 2019-05-05 NOTE — Progress Notes (Signed)
START ON PATHWAY REGIMEN - Melanoma and Other Skin Cancers   Nivolumab 1 mg/kg + Ipilimumab 3 mg/kg q21 Days x 4 Doses:   A cycle is every 21 days:     Nivolumab      Ipilimumab   **Always confirm dose/schedule in your pharmacy ordering system**  Nivolumab 240 mg q14 Days:   A cycle is every 14 days:     Nivolumab   **Always confirm dose/schedule in your pharmacy ordering system**  Patient Characteristics: Melanoma, Distant Metastases or Unresectable Local Recurrence, Unresectable, Brain Metastases, First Line, BRAF V600 Wild Type / BRAF V600 Results Pending or Unknown Disease Classification: Melanoma Disease Subtype: Cutaneous Therapeutic Status: Distant Metastases BRAF V600 Mutation Status: Did Not Order BRAF V600 Test Metastatic Disease Type: Brain Metastases Line of Therapy: First Line Intent of Therapy: Non-Curative / Palliative Intent, Discussed with Patient

## 2019-05-05 NOTE — Progress Notes (Signed)
[facility] [phone]   [phone]  OFFICE PROGRESS NOTE  Isaac Bliss, Rayford Halsted, MD [place] [id number]  DIAGNOSIS: Metastatic malignant melanoma initially started as a scalp melanoma status post wide excision followed by Mohs surgery at [facility] in 2017.  The patient presented in September 2020 with multiple brain metastases in addition to large right lower lobe subpleural/peridiaphragmatic soft tissue pulmonary mass as well as marked bilateral mediastinal and right hilar lymphadenopathy and suspicious 1.4 cm indeterminate right adrenal mass.  PRIOR THERAPY: Status post SBRT to the multiple brain metastasis under the care of Dr. Tammi Klippel  CURRENT THERAPY: Treatment with immunotherapy with ipilimumab 3 mg/KG and nivolumab 1 mg/KG every 3 weeks for 4 cycles followed by maintenance nivolumab starting from cycle #5.  First dose May 14, 2019.  INTERVAL HISTORY: Randall Mcgee 78 y.o. male returns to the clinic today for hospital follow-up visit.  The patient was seen during his hospitalization in early September 2020 after he presented with mental status changes and MRI of the brain showed multiple hemorrhagic brain metastasis suspicious for metastatic melanoma.  The patient had staging work-up at that time including CT scan of the chest, abdomen and pelvis that showed right lower lobe pleural-based mass in addition to right hilar and bilateral mediastinal and left supraclavicular lymphadenopathy as well as suspicious and indeterminate right adrenal mass.  He underwent ultrasound-guided left supraclavicular lymph node biopsy by interventional radiology and the final pathology [id number]) was consistent with metastatic malignant melanoma.  The patient received stereotactic radiotherapy to the metastatic brain lesions under the care of Dr. Tammi Klippel.  He was also started on physical therapy at home and has been doing  well but still require assistance with ambulation and walker.  He denied having any current chest pain, shortness breath, cough or hemoptysis.  He denied having any fever or chills.  He has no nausea, vomiting, diarrhea or constipation.  His wife was also recently admitted to the hospital with metastatic disease in both legs and needs help at home.  The patient is here today for evaluation and discussion of his treatment options for the metastatic melanoma.  MEDICAL HISTORY: Past Medical History:  Diagnosis Date   Anxiety    Cancer ([place]) 2017   melanoma on scalp   History of chicken pox    Hyperlipidemia    Hypertension    Right inguinal hernia     ALLERGIES:  has No Known Allergies.  MEDICATIONS:  Current Outpatient Medications  Medication Sig Dispense Refill   acetaminophen (TYLENOL) 325 MG tablet Take 2 tablets (650 mg total) by mouth every 6 (six) hours as needed for mild pain or headache.     amLODipine (NORVASC) 5 MG tablet Take 1 tablet (5 mg total) by mouth daily. 30 tablet 0   cetirizine (ZYRTEC) 10 MG tablet Take 1 tablet (10 mg total) by mouth daily. 30 tablet 11   dexamethasone (DECADRON) 4 MG tablet Take 1 tablet (4 mg total) by mouth 2 (two) times daily. For one month, then we will taper to 2 mg BID 60 tablet 0   lisinopril (PRINIVIL,ZESTRIL) 40 MG tablet Take 1 tablet (40 mg total) by mouth daily. 90 tablet 3   magic mouthwash w/lidocaine SOLN Take 5 mLs by mouth 4 (four) times daily as needed for mouth pain (swish, gargle and spit out or swallow, prior to meals and at bedtime). 240 mL 1   Multiple Vitamins-Minerals (  CENTRUM SILVER PO) Take 1 tablet by mouth daily.      propranolol (INDERAL) 20 MG tablet Take 40 mg by mouth 2 (two) times daily.     vitamin B-12 (CYANOCOBALAMIN) 1000 MCG tablet Take 1 tablet (1,000 mcg total) by mouth daily.     magic mouthwash w/lidocaine SOLN Take 5 mLs by mouth 4 (four) times daily as needed for mouth pain. (Patient not  taking: Reported on 05/05/2019) 500 mL 2   magic mouthwash w/lidocaine SOLN Swish and swallow 5 ml by mouth 4 times daily. Lidocaine, Maalox, Benadryl (Patient not taking: Reported on 05/05/2019) 120 mL 0   nystatin (MYCOSTATIN/NYSTOP) powder Apply topically 2 (two) times daily. To groin (Patient not taking: Reported on 05/05/2019) 15 g 2   No current facility-administered medications for this visit.     SURGICAL HISTORY:  Past Surgical History:  Procedure Laterality Date   APPLICATION OF A-CELL OF EXTREMITY N/A 07/13/2016   Procedure: APPLICATION OF A-CELL;  Surgeon: Wallace Going, DO;  Location: [place];  Service: Plastics;  Laterality: N/A;   APPLICATION OF A-CELL OF HEAD/NECK N/A 06/28/2016   Procedure: APPLICATION OF A-CELL OF HEAD/NECK;  Surgeon: Wallace Going, DO;  Location: [place];  Service: Plastics;  Laterality: N/A;   APPLICATION OF A-CELL OF HEAD/NECK N/A 07/26/2016   Procedure: APPLICATION OF ACELL OF HEAD;  Surgeon: Wallace Going, DO;  Location: [place];  Service: Plastics;  Laterality: N/A;   APPLICATION OF A-CELL OF HEAD/NECK N/A 08/31/2016   Procedure: APPLICATION OF A-CELL OF HEAD/NECK;  Surgeon: Wallace Going, DO;  Location: [place];  Service: Plastics;  Laterality: N/A;   CATARACT EXTRACTION, BILATERAL     CHOLECYSTECTOMY     ENDARTERECTOMY  06/20/11   ENDARTERECTOMY  06/20/2011   Procedure: ENDARTERECTOMY CAROTID;  Surgeon: Elam Dutch, MD;  Location: [facility] OR;  Service: Vascular;  Laterality: Right;  Right Carotid endarterectomy with dacron patch angioplasty    INGUINAL HERNIA REPAIR Right 08/16/2018   Procedure: RIGHT INGUINAL HERNIA REPAIR WITH MESH;  Surgeon: Georganna Skeans, MD;  Location: [place];  Service: General;  Laterality: Right;   MASS EXCISION N/A 06/28/2016   Procedure: EXCISION SCALP MELANOMA;  Surgeon: Wallace Going, DO;   Location: [place];  Service: Plastics;  Laterality: N/A;   MASS EXCISION N/A 07/13/2016   Procedure: RE- EXCISION OF SCALP MELANOMA;  Surgeon: Wallace Going, DO;  Location: [place];  Service: Plastics;  Laterality: N/A;   MASS EXCISION N/A 07/26/2016   Procedure: RE-EXCISION OF SCALP MELANOMA FOR POSITIVE MARGAIN;  Surgeon: Wallace Going, DO;  Location: [place];  Service: Plastics;  Laterality: N/A;   SKIN SPLIT GRAFT Left 08/31/2016   Procedure: SKIN GRAFT SPLIT THICKNESS TO HIS SCALP FROM HIS THIGH;  Surgeon: Wallace Going, DO;  Location: [place];  Service: Plastics;  Laterality: Left;   TONSILLECTOMY     TONSILLECTOMY      REVIEW OF SYSTEMS:  Constitutional: positive for fatigue Eyes: negative Ears, nose, mouth, throat, and face: negative Respiratory: negative Cardiovascular: negative Gastrointestinal: negative Genitourinary:negative Integument/breast: negative Hematologic/lymphatic: negative Musculoskeletal:positive for muscle weakness Neurological: positive for coordination problems and memory problems Behavioral/Psych: negative Endocrine: negative Allergic/Immunologic: negative   PHYSICAL EXAMINATION: General appearance: alert, cooperative, fatigued and no distress Head: Normocephalic, without obvious abnormality, atraumatic Neck: no adenopathy, no JVD, supple, symmetrical, trachea midline and thyroid not enlarged,  symmetric, no tenderness/mass/nodules Lymph nodes: Cervical, supraclavicular, and axillary nodes normal. Resp: clear to auscultation bilaterally Back: symmetric, no curvature. ROM normal. No CVA tenderness. Cardio: regular rate and rhythm, S1, S2 normal, no murmur, click, rub or gallop GI: soft, non-tender; bowel sounds normal; no masses,  no organomegaly Extremities: extremities normal, atraumatic, no cyanosis or edema Neurologic: Alert and oriented X 3, normal strength and  tone. Normal symmetric reflexes. Normal coordination and gait  ECOG PERFORMANCE STATUS: 1 - Symptomatic but completely ambulatory  Blood pressure 123/74, pulse 69, temperature 98.2 F (36.8 C), temperature source Temporal, resp. rate 18, height 5\' 11"  (1.803 m), weight 198 lb 3.2 oz (89.9 kg), SpO2 98 %.  LABORATORY DATA: Lab Results  Component Value Date   WBC 6.6 05/05/2019   HGB 12.6 (L) 05/05/2019   HCT 37.5 (L) 05/05/2019   MCV 97.7 05/05/2019   PLT 102 (L) 05/05/2019      Chemistry      Component Value Date/Time   NA 138 05/05/2019 1055   K 4.5 05/05/2019 1055   CL 103 05/05/2019 1055   CO2 28 05/05/2019 1055   BUN 26 (H) 05/05/2019 1055   CREATININE 0.93 05/05/2019 1055      Component Value Date/Time   CALCIUM 8.4 (L) 05/05/2019 1055   ALKPHOS 41 05/05/2019 1055   AST 38 05/05/2019 1055   ALT 73 (H) 05/05/2019 1055   BILITOT 0.4 05/05/2019 1055       RADIOGRAPHIC STUDIES: Mr Jeri Cos [id number] Contrast  Result Date: 04/17/2019 CLINICAL DATA:  Metastatic melanoma. Preoperative planning for stereotactic radio surgery. EXAM: MRI HEAD WITHOUT AND WITH CONTRAST TECHNIQUE: Multiplanar, multiecho pulse sequences of the brain and surrounding structures were obtained without and with intravenous contrast. CONTRAST:  70mL MULTIHANCE GADOBENATE DIMEGLUMINE 529 MG/ML IV SOLN COMPARISON:  MRI head 03/26/2019 FINDINGS: Brain: Multiple enhancing lesions the brain compatible with widespread metastatic disease. These lesions show susceptibility compatible with prior hemorrhage/melanin content. Lesions are marked on the axial postcontrast images in PACS. 2 mm area of restricted diffusion right cerebellum without enhancement. This was not seen previously and could represent a small acute infarct versus early metastatic disease. 10 mm right occipital lesion with surrounding edema Punctate 2 mm enhancing lesion right occipital lesion with surrounding edema unchanged 20 mm cystic mass left occipital  lobe with surrounding edema Complex hemorrhagic lesion right occipital parietal lobe measuring 43 x 37 mm similar to prior. Surrounding edema and local mass-effect. 11 mm left lateral temporal lesion 54mm enhancing lesion right frontal lobe Punctate 1.5 mm lesion right parietal cortex axial image 133 Punctate 1.5 x 2 mm enhancing lesion left frontal cortex axial image 140 Ventricle size normal.  Mild atrophy.  No midline shift. Vascular: Normal arterial flow voids. Skull and upper cervical spine: No skull lesion. Left frontal scalp resection over the convexity. Sinuses/Orbits: Mild mucosal edema paranasal sinuses. Bilateral cataract surgery Other: None IMPRESSION: At least 9 metastatic deposits in the brain as described above. Most of these show evidence of prior hemorrhage or melanin deposition due to metastatic melanoma. Largest lesion right occipital parietal lobe with surrounding edema and local mass-effect. New punctate area of restricted diffusion right cerebellum without enhancement. This could represent acute infarct due to hypercoagulability or possibly early metastatic disease. Electronically Signed   By: Franchot Gallo M.D.   On: 04/17/2019 16:08    ASSESSMENT AND PLAN: This is a very pleasant 78 years old white male recently diagnosed with metastatic malignant melanoma that was initially presented  as a scalp malignant melanoma status post wide excision followed by Mohs surgery in 2017 at [facility].  He was diagnosed in September 2020 with metastatic malignant melanoma involving the right lung, right hilar, mediastinal and left supraclavicular lymphadenopathy as well as multiple metastatic brain lesions. He is status post a stereotactic radiotherapy to the brain metastasis under the care of Dr. Tammi Klippel.  He is currently on a tapered dose of Decadron 4 mg p.o. twice daily. I had a lengthy discussion with the patient today about his current condition and treatment options. I recommended  for the patient to have a PET scan performed for complete evaluation of his disease. The patient understands that he has incurable condition and all the treatment will be of palliative nature.  He was giving the option of palliative care versus palliative systemic immunotherapy with ipilimumab 3 MG/KG in addition to nivolumab 1 mg/KG every 3 weeks for 4 cycles followed by maintenance nivolumab starting from cycle #5.  I discussed with the patient the adverse effect of this treatment including but not limited to immune mediated skin rash, diarrhea, inflammation of the lung, kidney, liver, thyroid or other endocrine dysfunction. The patient would like to proceed with the immunotherapy as planned and he is expected to start the first cycle of this treatment next week. I recommended for him to start tapering Decadron with 4 mg p.o. daily starting from today and we will decrease the dose to 2 mg p.o. daily with the start of his treatment next week. We will refer the patient to social worker for assistance with his transportation. The patient will have a chemotherapy education class before starting the first dose of his treatment. He will come back for follow-up visit next week for evaluation before starting the first cycle of his treatment. The patient was advised to call immediately if he has any concerning symptoms in the interval.  The patient voices understanding of current disease status and treatment options and is in agreement with the current care plan.  All questions were answered. The patient knows to call the clinic with any problems, questions or concerns. We can certainly see the patient much sooner if necessary.  I spent 20 minutes counseling the patient face to face. The total time spent in the appointment was 30 minutes.  Disclaimer: This note was dictated with voice recognition software. Similar sounding words can inadvertently be transcribed and may not be corrected upon review.

## 2019-05-05 NOTE — Telephone Encounter (Signed)
Scheduled per 10/12 los, patient received after visit summary and calender.

## 2019-05-06 DIAGNOSIS — I69198 Other sequelae of nontraumatic intracerebral hemorrhage: Secondary | ICD-10-CM | POA: Diagnosis not present

## 2019-05-06 DIAGNOSIS — C7932 Secondary malignant neoplasm of cerebral meninges: Secondary | ICD-10-CM | POA: Diagnosis not present

## 2019-05-06 DIAGNOSIS — C434 Malignant melanoma of scalp and neck: Secondary | ICD-10-CM | POA: Diagnosis not present

## 2019-05-06 DIAGNOSIS — H5462 Unqualified visual loss, left eye, normal vision right eye: Secondary | ICD-10-CM | POA: Diagnosis not present

## 2019-05-06 DIAGNOSIS — C7931 Secondary malignant neoplasm of brain: Secondary | ICD-10-CM | POA: Diagnosis not present

## 2019-05-08 ENCOUNTER — Other Ambulatory Visit: Payer: Self-pay | Admitting: Internal Medicine

## 2019-05-08 ENCOUNTER — Telehealth: Payer: Self-pay | Admitting: Internal Medicine

## 2019-05-08 DIAGNOSIS — B370 Candidal stomatitis: Secondary | ICD-10-CM

## 2019-05-08 MED ORDER — MAGIC MOUTHWASH W/LIDOCAINE: 5.0000 mL | Freq: Four times a day (QID) | ORAL | 1 refills | Status: DC | PRN

## 2019-05-08 NOTE — Telephone Encounter (Signed)
rx refill magic mouthwash w/lidocaine [facility] [id number] [address], [address] [phone] (Phone) [phone] (Fax)

## 2019-05-09 NOTE — Telephone Encounter (Signed)
Patient is calling to checking on the status of his magic mouthwash.  Preferred Pharmacy- [facility]. Patient states that he has been waiting 3 days. Faxs have been sent with no response.

## 2019-05-10 ENCOUNTER — Emergency Department (HOSPITAL_COMMUNITY)
Admission: EM | Admit: 2019-05-10 | Discharge: 2019-05-10 | Disposition: A | Discharge status: Home / Self Care | Payer: Medicare Other | Source: Home / Self Care | Attending: Emergency Medicine | Admitting: Emergency Medicine

## 2019-05-10 ENCOUNTER — Other Ambulatory Visit: Payer: Self-pay

## 2019-05-10 ENCOUNTER — Encounter (HOSPITAL_COMMUNITY): Payer: Self-pay | Admitting: Emergency Medicine

## 2019-05-10 DIAGNOSIS — I447 Left bundle-branch block, unspecified: Secondary | ICD-10-CM | POA: Diagnosis not present

## 2019-05-10 DIAGNOSIS — C434 Malignant melanoma of scalp and neck: Secondary | ICD-10-CM | POA: Diagnosis not present

## 2019-05-10 DIAGNOSIS — Z79899 Other long term (current) drug therapy: Secondary | ICD-10-CM | POA: Insufficient documentation

## 2019-05-10 DIAGNOSIS — R609 Edema, unspecified: Secondary | ICD-10-CM

## 2019-05-10 DIAGNOSIS — R944 Abnormal results of kidney function studies: Secondary | ICD-10-CM | POA: Diagnosis not present

## 2019-05-10 DIAGNOSIS — D6869 Other thrombophilia: Secondary | ICD-10-CM | POA: Diagnosis not present

## 2019-05-10 DIAGNOSIS — C7931 Secondary malignant neoplasm of brain: Secondary | ICD-10-CM | POA: Diagnosis not present

## 2019-05-10 DIAGNOSIS — R1310 Dysphagia, unspecified: Secondary | ICD-10-CM | POA: Diagnosis not present

## 2019-05-10 DIAGNOSIS — I2699 Other pulmonary embolism without acute cor pulmonale: Secondary | ICD-10-CM | POA: Diagnosis not present

## 2019-05-10 DIAGNOSIS — E785 Hyperlipidemia, unspecified: Secondary | ICD-10-CM | POA: Diagnosis not present

## 2019-05-10 DIAGNOSIS — D696 Thrombocytopenia, unspecified: Secondary | ICD-10-CM | POA: Diagnosis not present

## 2019-05-10 DIAGNOSIS — R739 Hyperglycemia, unspecified: Secondary | ICD-10-CM | POA: Insufficient documentation

## 2019-05-10 DIAGNOSIS — I82442 Acute embolism and thrombosis of left tibial vein: Secondary | ICD-10-CM | POA: Diagnosis not present

## 2019-05-10 DIAGNOSIS — Z7952 Long term (current) use of systemic steroids: Secondary | ICD-10-CM | POA: Diagnosis not present

## 2019-05-10 DIAGNOSIS — I10 Essential (primary) hypertension: Secondary | ICD-10-CM | POA: Insufficient documentation

## 2019-05-10 DIAGNOSIS — R7401 Elevation of levels of liver transaminase levels: Secondary | ICD-10-CM | POA: Diagnosis not present

## 2019-05-10 DIAGNOSIS — I82432 Acute embolism and thrombosis of left popliteal vein: Secondary | ICD-10-CM | POA: Diagnosis not present

## 2019-05-10 DIAGNOSIS — Z923 Personal history of irradiation: Secondary | ICD-10-CM | POA: Diagnosis not present

## 2019-05-10 DIAGNOSIS — R531 Weakness: Secondary | ICD-10-CM | POA: Diagnosis not present

## 2019-05-10 DIAGNOSIS — R600 Localized edema: Secondary | ICD-10-CM | POA: Diagnosis not present

## 2019-05-10 DIAGNOSIS — T380X5A Adverse effect of glucocorticoids and synthetic analogues, initial encounter: Secondary | ICD-10-CM | POA: Diagnosis not present

## 2019-05-10 DIAGNOSIS — Z8249 Family history of ischemic heart disease and other diseases of the circulatory system: Secondary | ICD-10-CM | POA: Diagnosis not present

## 2019-05-10 DIAGNOSIS — C7801 Secondary malignant neoplasm of right lung: Secondary | ICD-10-CM | POA: Diagnosis not present

## 2019-05-10 DIAGNOSIS — Z66 Do not resuscitate: Secondary | ICD-10-CM | POA: Diagnosis not present

## 2019-05-10 DIAGNOSIS — E1165 Type 2 diabetes mellitus with hyperglycemia: Secondary | ICD-10-CM | POA: Diagnosis not present

## 2019-05-10 DIAGNOSIS — I619 Nontraumatic intracerebral hemorrhage, unspecified: Secondary | ICD-10-CM | POA: Diagnosis not present

## 2019-05-10 DIAGNOSIS — J189 Pneumonia, unspecified organism: Secondary | ICD-10-CM | POA: Diagnosis not present

## 2019-05-10 DIAGNOSIS — M7989 Other specified soft tissue disorders: Secondary | ICD-10-CM | POA: Diagnosis not present

## 2019-05-10 LAB — COMPREHENSIVE METABOLIC PANEL
ALT: 83 U/L — ABNORMAL HIGH (ref 0–44)
AST: 41 U/L (ref 15–41)
Albumin: 3 g/dL — ABNORMAL LOW (ref 3.5–5.0)
Alkaline Phosphatase: 43 U/L (ref 38–126)
Anion gap: 9 (ref 5–15)
BUN: 38 mg/dL — ABNORMAL HIGH (ref 8–23)
CO2: 27 mmol/L (ref 22–32)
Calcium: 9 mg/dL (ref 8.9–10.3)
Chloride: 102 mmol/L (ref 98–111)
Creatinine, Ser: 0.98 mg/dL (ref 0.61–1.24)
GFR calc Af Amer: >60 mL/min (ref >60)
GFR calc non Af Amer: >60 mL/min (ref >60)
Glucose, Bld: 463 mg/dL — ABNORMAL HIGH (ref 70–99)
Potassium: 4.4 mmol/L (ref 3.5–5.1)
Sodium: 138 mmol/L (ref 135–145)
Total Bilirubin: 0.6 mg/dL (ref 0.3–1.2)
Total Protein: 5.5 g/dL — ABNORMAL LOW (ref 6.5–8.1)

## 2019-05-10 LAB — CBC WITH DIFFERENTIAL/PLATELET
Abs Immature Granulocytes: 0.59 10*3/uL — ABNORMAL HIGH (ref 0.00–0.07)
Basophils Absolute: 0.1 10*3/uL (ref 0.0–0.1)
Basophils Relative: 1 %
Eosinophils Absolute: 0 10*3/uL (ref 0.0–0.5)
Eosinophils Relative: 0 %
HCT: 39.9 % (ref 39.0–52.0)
Hemoglobin: 13.3 g/dL (ref 13.0–17.0)
Immature Granulocytes: 6 %
Lymphocytes Relative: 9 %
Lymphs Abs: 0.8 10*3/uL (ref 0.7–4.0)
MCH: 33.6 pg (ref 26.0–34.0)
MCHC: 33.3 g/dL (ref 30.0–36.0)
MCV: 100.8 fL — ABNORMAL HIGH (ref 80.0–100.0)
Monocytes Absolute: 0.4 10*3/uL (ref 0.1–1.0)
Monocytes Relative: 5 %
Neutro Abs: 7.5 10*3/uL (ref 1.7–7.7)
Neutrophils Relative %: 79 %
Platelets: 129 10*3/uL — ABNORMAL LOW (ref 150–400)
RBC: 3.96 MIL/uL — ABNORMAL LOW (ref 4.22–5.81)
RDW: 13.9 % (ref 11.5–15.5)
WBC: 9.4 10*3/uL (ref 4.0–10.5)
nRBC: 0 % (ref 0.0–0.2)

## 2019-05-10 LAB — URINALYSIS, ROUTINE W REFLEX MICROSCOPIC
Bacteria, UA: NONE SEEN
Bilirubin Urine: NEGATIVE
Glucose, UA: NEGATIVE mg/dL
Hgb urine dipstick: NEGATIVE
Ketones, ur: NEGATIVE mg/dL
Leukocytes,Ua: NEGATIVE
Nitrite: NEGATIVE
Protein, ur: 100 mg/dL — AB
Specific Gravity, Urine: 1.029 (ref 1.005–1.030)
pH: 6 (ref 5.0–8.0)

## 2019-05-10 LAB — CBG MONITORING, ED: Glucose-Capillary: 325 mg/dL — ABNORMAL HIGH (ref 70–99)

## 2019-05-10 LAB — BRAIN NATRIURETIC PEPTIDE: B Natriuretic Peptide: 128.4 pg/mL — ABNORMAL HIGH (ref 0.0–100.0)

## 2019-05-10 MED ORDER — POTASSIUM CHLORIDE ER 10 MEQ PO TBCR: 10.0000 meq | EXTENDED_RELEASE_TABLET | Freq: Every day | ORAL | 0 refills | Status: DC

## 2019-05-10 MED ORDER — INSULIN ASPART 100 UNIT/ML IV SOLN
10.0000 [IU] | Freq: Once | INTRAVENOUS | Status: AC
  Administered 2019-05-10: 10 [IU] via INTRAVENOUS
  Filled 2019-05-10: qty 0.1

## 2019-05-10 MED ORDER — FUROSEMIDE 10 MG/ML IJ SOLN
20.0000 mg | Freq: Once | INTRAMUSCULAR | Status: AC
  Administered 2019-05-10: 20:00:00 20 mg via INTRAVENOUS
  Filled 2019-05-10: qty 4

## 2019-05-10 MED ORDER — FUROSEMIDE 20 MG PO TABS: 20.0000 mg | ORAL_TABLET | Freq: Every day | ORAL | 0 refills | Status: DC

## 2019-05-10 NOTE — ED Provider Notes (Signed)
[facility] DEPT Provider Note   CSN: [id number] Arrival date & time: 05/10/19  1922     History   Chief Complaint Chief Complaint  Patient presents with  . Foot Swelling    HPI Randall Mcgee is a 78 y.o. male.     HPI Patient with history of metastatic melanoma to the brain with recent admission.  States that the last 4 days he has developed bilateral lower extremity swelling.  Denies shortness of breath or chest pain.  Patient does endorse generalized fatigue but no focal weakness or numbness.  No visual changes.  Patient was recently started on Decadron during his last admission. Past Medical History:  Diagnosis Date  . Anxiety   . Cancer ([place]) 2017   melanoma on scalp  . History of chicken pox   . Hyperlipidemia   . Hypertension   . Right inguinal hernia     Patient Active Problem List   Diagnosis Date Noted  . Encounter for antineoplastic immunotherapy 05/05/2019  . Goals of care, counseling/discussion 05/05/2019  . Brain metastases ([place]) 03/26/2019  . ICH (intracerebral hemorrhage) ([place]) 03/25/2019  . Erectile dysfunction 06/19/2018  . Anxiety state 06/19/2018  . Status post split thickness skin graft 09/08/2016  . Melanoma of skin ([place]) 07/13/2016  . Malignant melanoma of scalp ([place]) 05/29/2016  . Elevated TSH 07/27/2015  . Impaired glucose tolerance 07/27/2015  . Carotid artery stenosis 11/07/2011  . Right inguinal hernia 11/07/2011  . Hypertension 10/03/2010  . Hypercholesterolemia 10/03/2010    Past Surgical History:  Procedure Laterality Date  . APPLICATION OF A-CELL OF EXTREMITY N/A 07/13/2016   Procedure: APPLICATION OF A-CELL;  Surgeon: Wallace Going, DO;  Location: [facility];  Service: Plastics;  Laterality: N/A;  . APPLICATION OF A-CELL OF HEAD/NECK N/A 06/28/2016   Procedure: APPLICATION OF A-CELL OF HEAD/NECK;  Surgeon: Wallace Going, DO;  Location: [facility];   Service: Plastics;  Laterality: N/A;  . APPLICATION OF A-CELL OF HEAD/NECK N/A 07/26/2016   Procedure: APPLICATION OF ACELL OF HEAD;  Surgeon: Wallace Going, DO;  Location: [facility];  Service: Plastics;  Laterality: N/A;  . APPLICATION OF A-CELL OF HEAD/NECK N/A 08/31/2016   Procedure: APPLICATION OF A-CELL OF HEAD/NECK;  Surgeon: Wallace Going, DO;  Location: [facility];  Service: Plastics;  Laterality: N/A;  . CATARACT EXTRACTION, BILATERAL    . CHOLECYSTECTOMY    . ENDARTERECTOMY  06/20/11  . ENDARTERECTOMY  06/20/2011   Procedure: ENDARTERECTOMY CAROTID;  Surgeon: Elam Dutch, MD;  Location: [facility] OR;  Service: Vascular;  Laterality: Right;  Right Carotid endarterectomy with dacron patch angioplasty   . INGUINAL HERNIA REPAIR Right 08/16/2018   Procedure: RIGHT INGUINAL HERNIA REPAIR WITH MESH;  Surgeon: Georganna Skeans, MD;  Location: [facility];  Service: General;  Laterality: Right;  . MASS EXCISION N/A 06/28/2016   Procedure: EXCISION SCALP MELANOMA;  Surgeon: Wallace Going, DO;  Location: [facility];  Service: Plastics;  Laterality: N/A;  . MASS EXCISION N/A 07/13/2016   Procedure: RE- EXCISION OF SCALP MELANOMA;  Surgeon: Wallace Going, DO;  Location: [facility];  Service: Plastics;  Laterality: N/A;  . MASS EXCISION N/A 07/26/2016   Procedure: RE-EXCISION OF SCALP MELANOMA FOR POSITIVE MARGAIN;  Surgeon: Wallace Going, DO;  Location: [facility];  Service: Plastics;  Laterality: N/A;  . SKIN SPLIT GRAFT Left 08/31/2016   Procedure:  SKIN GRAFT SPLIT THICKNESS TO HIS SCALP FROM HIS THIGH;  Surgeon: Wallace Going, DO;  Location: [facility];  Service: Plastics;  Laterality: Left;  . TONSILLECTOMY    . TONSILLECTOMY          Home Medications    Prior to Admission medications   Medication Sig Start Date End Date Taking? Authorizing Provider   acetaminophen (TYLENOL) 325 MG tablet Take 2 tablets (650 mg total) by mouth every 6 (six) hours as needed for mild pain or headache. 04/02/19  Yes Barb Merino, MD  ALPRAZolam Duanne Moron) 0.5 MG tablet Take 0.5 mg by mouth 3 (three) times daily as needed for anxiety.   Yes [provider]  amLODipine (NORVASC) 5 MG tablet Take 1 tablet (5 mg total) by mouth daily. 04/05/19 05/10/19 Yes Dahal, Marlowe Aschoff, MD  cetirizine (ZYRTEC) 10 MG tablet Take 1 tablet (10 mg total) by mouth daily. 06/19/18  Yes Isaac Bliss, [place] Halsted, MD  dexamethasone (DECADRON) 4 MG tablet Take 1 tablet (4 mg total) by mouth 2 (two) times daily. For one month, then we will taper to 2 mg BID 05/02/19 06/01/19 Yes Tyler Pita, MD  fluconazole (DIFLUCAN) 100 MG tablet Take 1 tablet (100 mg total) by mouth daily. 05/05/19  Yes Heilingoetter, Cassandra L, PA-C  lisinopril (PRINIVIL,ZESTRIL) 40 MG tablet Take 1 tablet (40 mg total) by mouth daily. 09/19/18  Yes Isaac Bliss, [place] Halsted, MD  Multiple Vitamins-Minerals (CENTRUM SILVER PO) Take 1 tablet by mouth daily.    Yes [provider]  propranolol (INDERAL) 20 MG tablet Take 40 mg by mouth 2 (two) times daily.   Yes [provider]  triamcinolone (KENALOG) 0.1 % paste Use as directed 1 application in the mouth or throat every 3 (three) hours as needed (mouth sores).  05/07/19  Yes [provider]  vitamin B-12 (CYANOCOBALAMIN) 1000 MCG tablet Take 1 tablet (1,000 mcg total) by mouth daily. 04/02/19  Yes Barb Merino, MD  furosemide (LASIX) 20 MG tablet Take 1 tablet (20 mg total) by mouth daily for 5 days. 05/11/19 05/16/19  Julianne Rice, MD  magic mouthwash w/lidocaine SOLN Take 5 mLs by mouth 4 (four) times daily as needed for mouth pain. Patient not taking: Reported on 05/05/2019 04/24/19   Isaac Bliss, [place] Halsted, MD  magic mouthwash w/lidocaine SOLN Swish and swallow 5 ml by mouth 4 times daily. Lidocaine, Maalox, Benadryl Patient not  taking: Reported on 05/05/2019 05/02/19   Tyler Pita, MD  magic mouthwash w/lidocaine SOLN Take 5 mLs by mouth 4 (four) times daily as needed for mouth pain (swish, gargle and spit out or swallow, prior to meals and at bedtime). Patient not taking: Reported on 05/10/2019 05/08/19   Isaac Bliss, [place] Halsted, MD  nystatin (MYCOSTATIN/NYSTOP) powder Apply topically 2 (two) times daily. To groin Patient not taking: Reported on 05/05/2019 04/24/19   Isaac Bliss, [place] Halsted, MD  potassium chloride (KLOR-CON) 10 MEQ tablet Take 1 tablet (10 mEq total) by mouth daily. 05/11/19   Julianne Rice, MD    Family History Family History  Problem Relation Age of Onset  . Heart disease Mother     Social History Social History   Tobacco Use  . Smoking status: Never Smoker  . Smokeless tobacco: Never Used  Substance Use Topics  . Alcohol use: No  . Drug use: No    Types: Other-see comments     Allergies   Patient has no known allergies.   Review of Systems  Review of Systems  Constitutional: Positive for fatigue. Negative for chills and fever.  HENT: Negative for sore throat and trouble swallowing.   Eyes: Negative for visual disturbance.  Respiratory: Negative for cough and shortness of breath.   Cardiovascular: Positive for leg swelling. Negative for chest pain and palpitations.  Gastrointestinal: Negative for abdominal pain, constipation, diarrhea, nausea and vomiting.  Genitourinary: Negative for dysuria, flank pain and frequency.  Musculoskeletal: Negative for back pain, myalgias and neck pain.  Skin: Negative for rash and wound.  Neurological: Negative for dizziness, weakness, light-headedness, numbness and headaches.  All other systems reviewed and are negative.    Physical Exam Updated Vital Signs BP 100/68 (BP Location: Right Arm)   Pulse 76   Temp 98.2 F (36.8 C) (Oral)   Resp 17   Ht 5\' 11"  (1.803 m)   Wt 89.9 kg   SpO2 94%   BMI 27.64 kg/m   Physical  Exam Vitals signs and nursing note reviewed.  Constitutional:      Appearance: Normal appearance. He is well-developed.  HENT:     Head: Normocephalic and atraumatic.     Nose: Nose normal.     Mouth/Throat:     Mouth: Mucous membranes are moist.  Eyes:     Pupils: Pupils are equal, round, and reactive to light.  Neck:     Musculoskeletal: Normal range of motion and neck supple. No neck rigidity or muscular tenderness.  Cardiovascular:     Rate and Rhythm: Normal rate and regular rhythm.     Heart sounds: No murmur. No friction rub. No gallop.   Pulmonary:     Effort: Pulmonary effort is normal. No respiratory distress.     Breath sounds: Normal breath sounds. No stridor. No wheezing, rhonchi or rales.  Chest:     Chest wall: No tenderness.  Abdominal:     General: Bowel sounds are normal. There is no distension.     Palpations: Abdomen is soft. There is no mass.     Tenderness: There is no abdominal tenderness. There is no right CVA tenderness, left CVA tenderness, guarding or rebound.     Hernia: No hernia is present.  Musculoskeletal: Normal range of motion.        General: No swelling, tenderness, deformity or signs of injury.     Right lower leg: Edema present.     Left lower leg: Edema present.     Comments: 3+ pitting edema bilateral lower extremities.  Distal pulses intact.  No asymmetry or tenderness.  Lymphadenopathy:     Cervical: No cervical adenopathy.  Skin:    General: Skin is warm and dry.     Findings: No erythema or rash.  Neurological:     General: No focal deficit present.     Mental Status: He is alert and oriented to person, place, and time.  Psychiatric:        Behavior: Behavior normal.      ED Treatments / Results  Labs (all labs ordered are listed, but only abnormal results are displayed) Labs Reviewed  CBC WITH DIFFERENTIAL/PLATELET - Abnormal; Notable for the following components:      Result Value   RBC 3.96 (*)    MCV 100.8 (*)     Platelets 129 (*)    Abs Immature Granulocytes 0.59 (*)    All other components within normal limits  BRAIN NATRIURETIC PEPTIDE - Abnormal; Notable for the following components:   B Natriuretic Peptide 128.4 (*)  All other components within normal limits  COMPREHENSIVE METABOLIC PANEL - Abnormal; Notable for the following components:   Glucose, Bld 463 (*)    BUN 38 (*)    Total Protein 5.5 (*)    Albumin 3.0 (*)    ALT 83 (*)    All other components within normal limits  URINALYSIS, ROUTINE W REFLEX MICROSCOPIC - Abnormal; Notable for the following components:   Protein, ur 100 (*)    All other components within normal limits  CBG MONITORING, ED - Abnormal; Notable for the following components:   Glucose-Capillary 325 (*)    All other components within normal limits    EKG None  Radiology No results found.  Procedures Procedures (including critical care time)  Medications Ordered in ED Medications  furosemide (LASIX) injection 20 mg (20 mg Intravenous Given 05/10/19 2026)  insulin aspart (novoLOG) injection 10 Units (10 Units Intravenous Given 05/10/19 2105)     Initial Impression / Assessment and Plan / ED Course  I have reviewed the triage vital signs and the nursing notes.  Pertinent labs & imaging results that were available during my care of the patient were reviewed by me and considered in my medical decision making (see chart for details).        Patient with peripheral edema and hyperglycemia.  Suspect this may be related to tapering dose of Decadron.  Given insulin and dose of IV Lasix.  Patient has diuresed some.  Will give several days of Lasix and advised keeping the legs elevated.  Patient will need to follow-up very closely with his primary physician to have his blood sugar rechecked.  Strict return precautions given.  Final Clinical Impressions(s) / ED Diagnoses   Final diagnoses:  Peripheral edema  Hyperglycemia    ED Discharge Orders          Ordered    furosemide (LASIX) 20 MG tablet  Daily     05/10/19 2229    potassium chloride (KLOR-CON) 10 MEQ tablet  Daily     05/10/19 2229           Julianne Rice, MD 05/10/19 2231

## 2019-05-10 NOTE — Discharge Instructions (Signed)
Follow-up closely with your primary physician.

## 2019-05-10 NOTE — ED Notes (Signed)
This patient's son, Delfino Lovett, left to go home. His phone number can be found in the chart if he needs to be reached for any reason.

## 2019-05-10 NOTE — ED Triage Notes (Signed)
Arrives via POV, patient complains of bilateral lower leg swelling, weeping edema +3, pedal pulses present bilaterally. Randall Mcgee states that patient has intermittent confusion, patient is alert and oriented x4, but states sometimes he gets confused. He was able to fold his clothes and bush his teeth tonight, no issues.

## 2019-05-12 ENCOUNTER — Emergency Department (HOSPITAL_COMMUNITY): Payer: Medicare Other

## 2019-05-12 ENCOUNTER — Inpatient Hospital Stay: Payer: Medicare Other

## 2019-05-12 ENCOUNTER — Telehealth: Payer: Self-pay | Admitting: *Deleted

## 2019-05-12 ENCOUNTER — Encounter (HOSPITAL_COMMUNITY): Payer: Self-pay | Admitting: Family Medicine

## 2019-05-12 ENCOUNTER — Inpatient Hospital Stay (HOSPITAL_COMMUNITY)
Admission: EM | Admit: 2019-05-12 | Discharge: 2019-05-17 | DRG: 175 | Disposition: A | Discharge status: Skilled Nursing Facility | Payer: Medicare Other | Attending: Internal Medicine | Admitting: Internal Medicine

## 2019-05-12 ENCOUNTER — Other Ambulatory Visit: Payer: Self-pay

## 2019-05-12 DIAGNOSIS — R609 Edema, unspecified: Secondary | ICD-10-CM | POA: Diagnosis not present

## 2019-05-12 DIAGNOSIS — R5381 Other malaise: Secondary | ICD-10-CM | POA: Diagnosis not present

## 2019-05-12 DIAGNOSIS — R2689 Other abnormalities of gait and mobility: Secondary | ICD-10-CM | POA: Diagnosis not present

## 2019-05-12 DIAGNOSIS — D696 Thrombocytopenia, unspecified: Secondary | ICD-10-CM | POA: Diagnosis not present

## 2019-05-12 DIAGNOSIS — R918 Other nonspecific abnormal finding of lung field: Secondary | ICD-10-CM | POA: Diagnosis present

## 2019-05-12 DIAGNOSIS — Z7401 Bed confinement status: Secondary | ICD-10-CM | POA: Diagnosis not present

## 2019-05-12 DIAGNOSIS — R739 Hyperglycemia, unspecified: Secondary | ICD-10-CM | POA: Diagnosis not present

## 2019-05-12 DIAGNOSIS — R944 Abnormal results of kidney function studies: Secondary | ICD-10-CM | POA: Diagnosis not present

## 2019-05-12 DIAGNOSIS — I69815 Cognitive social or emotional deficit following other cerebrovascular disease: Secondary | ICD-10-CM | POA: Diagnosis not present

## 2019-05-12 DIAGNOSIS — C7801 Secondary malignant neoplasm of right lung: Secondary | ICD-10-CM | POA: Diagnosis present

## 2019-05-12 DIAGNOSIS — T380X5A Adverse effect of glucocorticoids and synthetic analogues, initial encounter: Secondary | ICD-10-CM | POA: Diagnosis present

## 2019-05-12 DIAGNOSIS — I82442 Acute embolism and thrombosis of left tibial vein: Secondary | ICD-10-CM | POA: Diagnosis not present

## 2019-05-12 DIAGNOSIS — D6869 Other thrombophilia: Secondary | ICD-10-CM | POA: Diagnosis present

## 2019-05-12 DIAGNOSIS — Z923 Personal history of irradiation: Secondary | ICD-10-CM | POA: Diagnosis not present

## 2019-05-12 DIAGNOSIS — Z79899 Other long term (current) drug therapy: Secondary | ICD-10-CM | POA: Diagnosis not present

## 2019-05-12 DIAGNOSIS — I82412 Acute embolism and thrombosis of left femoral vein: Secondary | ICD-10-CM | POA: Diagnosis not present

## 2019-05-12 DIAGNOSIS — Z66 Do not resuscitate: Secondary | ICD-10-CM | POA: Diagnosis present

## 2019-05-12 DIAGNOSIS — R7401 Elevation of levels of liver transaminase levels: Secondary | ICD-10-CM | POA: Diagnosis not present

## 2019-05-12 DIAGNOSIS — I824Z2 Acute embolism and thrombosis of unspecified deep veins of left distal lower extremity: Secondary | ICD-10-CM | POA: Diagnosis not present

## 2019-05-12 DIAGNOSIS — Z515 Encounter for palliative care: Secondary | ICD-10-CM | POA: Diagnosis not present

## 2019-05-12 DIAGNOSIS — J189 Pneumonia, unspecified organism: Secondary | ICD-10-CM | POA: Diagnosis present

## 2019-05-12 DIAGNOSIS — M2550 Pain in unspecified joint: Secondary | ICD-10-CM | POA: Diagnosis not present

## 2019-05-12 DIAGNOSIS — I82409 Acute embolism and thrombosis of unspecified deep veins of unspecified lower extremity: Secondary | ICD-10-CM

## 2019-05-12 DIAGNOSIS — Z8249 Family history of ischemic heart disease and other diseases of the circulatory system: Secondary | ICD-10-CM | POA: Diagnosis not present

## 2019-05-12 DIAGNOSIS — I10 Essential (primary) hypertension: Secondary | ICD-10-CM | POA: Diagnosis present

## 2019-05-12 DIAGNOSIS — Z7189 Other specified counseling: Secondary | ICD-10-CM | POA: Diagnosis not present

## 2019-05-12 DIAGNOSIS — I82402 Acute embolism and thrombosis of unspecified deep veins of left lower extremity: Secondary | ICD-10-CM | POA: Diagnosis not present

## 2019-05-12 DIAGNOSIS — E1165 Type 2 diabetes mellitus with hyperglycemia: Secondary | ICD-10-CM | POA: Diagnosis not present

## 2019-05-12 DIAGNOSIS — I447 Left bundle-branch block, unspecified: Secondary | ICD-10-CM | POA: Diagnosis not present

## 2019-05-12 DIAGNOSIS — I619 Nontraumatic intracerebral hemorrhage, unspecified: Secondary | ICD-10-CM | POA: Diagnosis present

## 2019-05-12 DIAGNOSIS — M6281 Muscle weakness (generalized): Secondary | ICD-10-CM | POA: Diagnosis not present

## 2019-05-12 DIAGNOSIS — I612 Nontraumatic intracerebral hemorrhage in hemisphere, unspecified: Secondary | ICD-10-CM | POA: Diagnosis not present

## 2019-05-12 DIAGNOSIS — Z9221 Personal history of antineoplastic chemotherapy: Secondary | ICD-10-CM

## 2019-05-12 DIAGNOSIS — I82432 Acute embolism and thrombosis of left popliteal vein: Secondary | ICD-10-CM | POA: Diagnosis present

## 2019-05-12 DIAGNOSIS — E0965 Drug or chemical induced diabetes mellitus with hyperglycemia: Secondary | ICD-10-CM

## 2019-05-12 DIAGNOSIS — Z7952 Long term (current) use of systemic steroids: Secondary | ICD-10-CM | POA: Diagnosis not present

## 2019-05-12 DIAGNOSIS — E278 Other specified disorders of adrenal gland: Secondary | ICD-10-CM | POA: Diagnosis not present

## 2019-05-12 DIAGNOSIS — I2609 Other pulmonary embolism with acute cor pulmonale: Secondary | ICD-10-CM | POA: Diagnosis not present

## 2019-05-12 DIAGNOSIS — F419 Anxiety disorder, unspecified: Secondary | ICD-10-CM | POA: Diagnosis present

## 2019-05-12 DIAGNOSIS — C434 Malignant melanoma of scalp and neck: Secondary | ICD-10-CM | POA: Diagnosis not present

## 2019-05-12 DIAGNOSIS — R1310 Dysphagia, unspecified: Secondary | ICD-10-CM | POA: Diagnosis not present

## 2019-05-12 DIAGNOSIS — I2699 Other pulmonary embolism without acute cor pulmonale: Secondary | ICD-10-CM | POA: Diagnosis not present

## 2019-05-12 DIAGNOSIS — M7989 Other specified soft tissue disorders: Secondary | ICD-10-CM | POA: Diagnosis not present

## 2019-05-12 DIAGNOSIS — R531 Weakness: Secondary | ICD-10-CM | POA: Diagnosis not present

## 2019-05-12 DIAGNOSIS — C7931 Secondary malignant neoplasm of brain: Secondary | ICD-10-CM | POA: Diagnosis not present

## 2019-05-12 DIAGNOSIS — Z20828 Contact with and (suspected) exposure to other viral communicable diseases: Secondary | ICD-10-CM | POA: Diagnosis present

## 2019-05-12 DIAGNOSIS — K1370 Unspecified lesions of oral mucosa: Secondary | ICD-10-CM | POA: Diagnosis not present

## 2019-05-12 DIAGNOSIS — R2681 Unsteadiness on feet: Secondary | ICD-10-CM | POA: Diagnosis not present

## 2019-05-12 DIAGNOSIS — E785 Hyperlipidemia, unspecified: Secondary | ICD-10-CM | POA: Diagnosis not present

## 2019-05-12 DIAGNOSIS — R1311 Dysphagia, oral phase: Secondary | ICD-10-CM | POA: Diagnosis not present

## 2019-05-12 DIAGNOSIS — R279 Unspecified lack of coordination: Secondary | ICD-10-CM | POA: Diagnosis not present

## 2019-05-12 DIAGNOSIS — R0902 Hypoxemia: Secondary | ICD-10-CM | POA: Diagnosis not present

## 2019-05-12 LAB — URINALYSIS, ROUTINE W REFLEX MICROSCOPIC
Bacteria, UA: NONE SEEN
Bilirubin Urine: NEGATIVE
Glucose, UA: >=500 mg/dL — AB
Ketones, ur: NEGATIVE mg/dL
Leukocytes,Ua: NEGATIVE
Nitrite: NEGATIVE
Protein, ur: NEGATIVE mg/dL
Specific Gravity, Urine: 1.02 (ref 1.005–1.030)
pH: 5 (ref 5.0–8.0)

## 2019-05-12 LAB — BLOOD GAS, VENOUS
Acid-Base Excess: 8.3 mmol/L — ABNORMAL HIGH (ref 0.0–2.0)
Bicarbonate: 34.3 mmol/L — ABNORMAL HIGH (ref 20.0–28.0)
O2 Saturation: 35.1 %
Patient temperature: 98.6
pCO2, Ven: 54.6 mmHg (ref 44.0–60.0)
pH, Ven: 7.414 (ref 7.250–7.430)

## 2019-05-12 LAB — COMPREHENSIVE METABOLIC PANEL
ALT: 86 U/L — ABNORMAL HIGH (ref 0–44)
AST: 48 U/L — ABNORMAL HIGH (ref 15–41)
Albumin: 2.9 g/dL — ABNORMAL LOW (ref 3.5–5.0)
Alkaline Phosphatase: 46 U/L (ref 38–126)
Anion gap: 12 (ref 5–15)
BUN: 39 mg/dL — ABNORMAL HIGH (ref 8–23)
CO2: 29 mmol/L (ref 22–32)
Calcium: 9.3 mg/dL (ref 8.9–10.3)
Chloride: 97 mmol/L — ABNORMAL LOW (ref 98–111)
Creatinine, Ser: 0.99 mg/dL (ref 0.61–1.24)
GFR calc Af Amer: >60 mL/min (ref >60)
GFR calc non Af Amer: >60 mL/min (ref >60)
Glucose, Bld: 434 mg/dL — ABNORMAL HIGH (ref 70–99)
Potassium: 4.8 mmol/L (ref 3.5–5.1)
Sodium: 138 mmol/L (ref 135–145)
Total Bilirubin: 1.1 mg/dL (ref 0.3–1.2)
Total Protein: 5.7 g/dL — ABNORMAL LOW (ref 6.5–8.1)

## 2019-05-12 LAB — LACTIC ACID, PLASMA
Lactic Acid, Venous: 2.9 mmol/L (ref 0.5–1.9)
Lactic Acid, Venous: 2.2 mmol/L (ref 0.5–1.9)

## 2019-05-12 LAB — CBC WITH DIFFERENTIAL/PLATELET
Abs Immature Granulocytes: 0.58 10*3/uL — ABNORMAL HIGH (ref 0.00–0.07)
Basophils Absolute: 0.1 10*3/uL (ref 0.0–0.1)
Basophils Relative: 1 %
Eosinophils Absolute: 0 10*3/uL (ref 0.0–0.5)
Eosinophils Relative: 0 %
HCT: 41.3 % (ref 39.0–52.0)
Hemoglobin: 14 g/dL (ref 13.0–17.0)
Immature Granulocytes: 6 %
Lymphocytes Relative: 8 %
Lymphs Abs: 0.9 10*3/uL (ref 0.7–4.0)
MCH: 33.5 pg (ref 26.0–34.0)
MCHC: 33.9 g/dL (ref 30.0–36.0)
MCV: 98.8 fL (ref 80.0–100.0)
Monocytes Absolute: 0.3 10*3/uL (ref 0.1–1.0)
Monocytes Relative: 3 %
Neutro Abs: 8.3 10*3/uL — ABNORMAL HIGH (ref 1.7–7.7)
Neutrophils Relative %: 82 %
Platelets: 112 10*3/uL — ABNORMAL LOW (ref 150–400)
RBC: 4.18 MIL/uL — ABNORMAL LOW (ref 4.22–5.81)
RDW: 13.7 % (ref 11.5–15.5)
WBC: 10.1 10*3/uL (ref 4.0–10.5)
nRBC: 0 % (ref 0.0–0.2)

## 2019-05-12 LAB — TROPONIN I (HIGH SENSITIVITY): Troponin I (High Sensitivity): 64 ng/L — ABNORMAL HIGH (ref <18)

## 2019-05-12 LAB — GLUCOSE, CAPILLARY: Glucose-Capillary: 344 mg/dL — ABNORMAL HIGH (ref 70–99)

## 2019-05-12 LAB — BRAIN NATRIURETIC PEPTIDE: B Natriuretic Peptide: 119.6 pg/mL — ABNORMAL HIGH (ref 0.0–100.0)

## 2019-05-12 LAB — CBG MONITORING, ED
Glucose-Capillary: 428 mg/dL — ABNORMAL HIGH (ref 70–99)
Glucose-Capillary: 318 mg/dL — ABNORMAL HIGH (ref 70–99)
Glucose-Capillary: 340 mg/dL — ABNORMAL HIGH (ref 70–99)

## 2019-05-12 LAB — STREP PNEUMONIAE URINARY ANTIGEN: Strep Pneumo Urinary Antigen: NEGATIVE

## 2019-05-12 MED ORDER — PROPRANOLOL HCL 40 MG PO TABS
40.0000 mg | ORAL_TABLET | Freq: Two times a day (BID) | ORAL | Status: DC
  Administered 2019-05-12 – 2019-05-17 (×10): 40 mg via ORAL
  Filled 2019-05-12 (×11): qty 1

## 2019-05-12 MED ORDER — INSULIN ASPART 100 UNIT/ML ~~LOC~~ SOLN
0.0000 [IU] | Freq: Three times a day (TID) | SUBCUTANEOUS | Status: DC
  Administered 2019-05-13: 5 [IU] via SUBCUTANEOUS
  Administered 2019-05-13: 9 [IU] via SUBCUTANEOUS
  Administered 2019-05-13: 3 [IU] via SUBCUTANEOUS
  Administered 2019-05-14: 7 [IU] via SUBCUTANEOUS
  Administered 2019-05-14 (×2): 2 [IU] via SUBCUTANEOUS
  Administered 2019-05-15: 5 [IU] via SUBCUTANEOUS
  Administered 2019-05-15: 7 [IU] via SUBCUTANEOUS
  Administered 2019-05-15: 5 [IU] via SUBCUTANEOUS
  Administered 2019-05-16: 7 [IU] via SUBCUTANEOUS
  Administered 2019-05-17: 1 [IU] via SUBCUTANEOUS
  Administered 2019-05-17: 5 [IU] via SUBCUTANEOUS
  Filled 2019-05-12: qty 0.09

## 2019-05-12 MED ORDER — IOHEXOL 350 MG/ML SOLN
100.0000 mL | Freq: Once | INTRAVENOUS | Status: AC | PRN
  Administered 2019-05-12: 100 mL via INTRAVENOUS

## 2019-05-12 MED ORDER — SODIUM CHLORIDE (PF) 0.9 % IJ SOLN
INTRAMUSCULAR | Status: AC
  Filled 2019-05-12: qty 50

## 2019-05-12 MED ORDER — INSULIN ASPART 100 UNIT/ML ~~LOC~~ SOLN
0.0000 [IU] | Freq: Every day | SUBCUTANEOUS | Status: DC
  Administered 2019-05-12: 4 [IU] via SUBCUTANEOUS
  Administered 2019-05-13: 2 [IU] via SUBCUTANEOUS
  Administered 2019-05-14: 5 [IU] via SUBCUTANEOUS
  Administered 2019-05-15: 3 [IU] via SUBCUTANEOUS
  Administered 2019-05-16: 4 [IU] via SUBCUTANEOUS
  Filled 2019-05-12: qty 0.05

## 2019-05-12 MED ORDER — ALPRAZOLAM 0.5 MG PO TABS: 0.5000 mg | ORAL_TABLET | Freq: Three times a day (TID) | ORAL | Status: DC | PRN

## 2019-05-12 MED ORDER — DEXAMETHASONE 4 MG PO TABS
4.0000 mg | ORAL_TABLET | Freq: Two times a day (BID) | ORAL | Status: DC
  Administered 2019-05-12 – 2019-05-17 (×10): 4 mg via ORAL
  Filled 2019-05-12 (×11): qty 1

## 2019-05-12 MED ORDER — SODIUM CHLORIDE 0.9 % IV SOLN
INTRAVENOUS | Status: AC
  Administered 2019-05-12: 20:00:00 via INTRAVENOUS

## 2019-05-12 MED ORDER — SODIUM CHLORIDE 0.9 % IV BOLUS
1000.0000 mL | Freq: Once | INTRAVENOUS | Status: AC
  Administered 2019-05-12: 1000 mL via INTRAVENOUS

## 2019-05-12 MED ORDER — SODIUM CHLORIDE 0.9 % IV SOLN
2.0000 g | INTRAVENOUS | Status: AC
  Administered 2019-05-12 – 2019-05-16 (×5): 2 g via INTRAVENOUS
  Filled 2019-05-12 (×6): qty 20

## 2019-05-12 MED ORDER — HYDROCODONE-ACETAMINOPHEN 5-325 MG PO TABS
1.0000 | ORAL_TABLET | ORAL | Status: DC | PRN
  Administered 2019-05-12 – 2019-05-16 (×3): 1 via ORAL
  Administered 2019-05-16: 2 via ORAL
  Filled 2019-05-12 (×2): qty 1
  Filled 2019-05-12: qty 2
  Filled 2019-05-12: qty 1

## 2019-05-12 MED ORDER — ACETAMINOPHEN 325 MG PO TABS
650.0000 mg | ORAL_TABLET | Freq: Four times a day (QID) | ORAL | Status: DC | PRN
  Administered 2019-05-13 – 2019-05-16 (×3): 650 mg via ORAL
  Filled 2019-05-12 (×3): qty 2

## 2019-05-12 MED ORDER — ONDANSETRON HCL 4 MG PO TABS: 4.0000 mg | ORAL_TABLET | Freq: Four times a day (QID) | ORAL | Status: DC | PRN

## 2019-05-12 MED ORDER — ACETAMINOPHEN 650 MG RE SUPP: 650.0000 mg | Freq: Four times a day (QID) | RECTAL | Status: DC | PRN

## 2019-05-12 MED ORDER — SODIUM CHLORIDE 0.9 % IV SOLN
500.0000 mg | INTRAVENOUS | Status: AC
  Administered 2019-05-12 – 2019-05-16 (×5): 500 mg via INTRAVENOUS
  Filled 2019-05-12 (×5): qty 500

## 2019-05-12 MED ORDER — ONDANSETRON HCL 4 MG/2ML IJ SOLN: 4.0000 mg | Freq: Four times a day (QID) | INTRAMUSCULAR | Status: DC | PRN

## 2019-05-12 NOTE — Progress Notes (Signed)
Bilateral lower extremity venous duplex completed. Refer to "CV Proc" under chart review to view preliminary results.  Critical results discussed with Dr. Rex Kras.  05/12/2019 5:43 PM Maudry Mayhew, MHA, RVT, RDCS, RDMS

## 2019-05-12 NOTE — ED Notes (Signed)
CRITICAL VALUE STICKER  CRITICAL VALUE: pO2 venous blood gas = "below reportable range"   RECEIVER (on-site recipient of call): A.Manya Balash,RN  DATE & TIME NOTIFIED: 1627 05/12/19  MESSENGER (representative from lab): Ulice Dash  MD NOTIFIED: MD. R. Little  TIME OF NOTIFICATION:1628 05/12/19  RESPONSE: See new orders

## 2019-05-12 NOTE — Telephone Encounter (Signed)
Copied from [place] [phone]. Topic: General - Other >> May 12, 2019 10:14 AM Jodie Echevaria wrote: Reason for CRM: Patient son Barnet Anderer. Called to speak to Dr Jerilee Hoh on behalf of his fathers health and the changes within the past few weeks. Per son father went from 90% cognition to maybe 50% and has no strength in his body also is now incontinent with urine have been falling out of bed can hardly sit up  in his chair. Patient have an appointment this afternoon and son would liek a call back to see how that should be handled because he does not think his father is capable of going to this appointment on his own. He also want to discuss the medication patient is taking asking for a call back please at Ph# [phone]

## 2019-05-12 NOTE — ED Notes (Signed)
Pt returned from CT. [place] now at bedisde

## 2019-05-12 NOTE — ED Notes (Signed)
CRITICAL VALUE STICKER  CRITICAL VALUE: Lactic 2.9  RECEIVER (on-site recipient of call): This RN  DATE & TIME NOTIFIED: 05/12/19  MESSENGER (representative from lab): Ulice Dash from Lab  MD NOTIFIED: [place]: MD Notified Repeat back

## 2019-05-12 NOTE — Progress Notes (Signed)
Telephone discussion note  I was called by Dr. Myna Hidalgo, hospitalist, from [facility], to discuss the possibilities of anticoagulation-patient with hemorrhagic brain metastasis from possible melanoma now with a PE.  I reviewed the head images-from September 2020 and today-the head CT does show interval decrease in the previously seen intraparenchymal hemorrhages in both hemispheres at the site of the known hemorrhagic metastatic is but the blood has not completely resolved.  Anticoagulation at this time would be extremely risky for the risk of worsening of these intraparenchymal bleeds and might become fatal if the bleeds increase in size.  I would recommend goals of care discussion and involvement of palliative care.  I would also recommend running the case with neurosurgery, because if the anticoagulation is started, even heparin drip stroke protoco-l no bolus, that is high risk for frank increase of these bleeds and at that time neurosurgical involvement might become necessary.  Given his advanced age, advanced cancer with metastases-hemorrhagic brain mets, I doubt that he would be a good surgical candidate.  I discussed these plans in detail with Dr. Myna Hidalgo.   -- Amie Portland, MD [facility] Pager: [phone] If 7pm to 7am, please call on call as listed on AMION.

## 2019-05-12 NOTE — H&P (Addendum)
History and Physical    Randall Mcgee [id number] DOB: [birth year] DOA: 05/12/2019  PCP: Isaac Bliss, Rayford Halsted, MD   Patient coming from: Home   Chief Complaint: Leg swelling, abnormal labs   HPI: Randall Mcgee is a 78 y.o. male with medical history significant for metastatic melanoma with hemorrhagic brain metastases, and hypertension, now presenting to emergency department with leg swelling and hyperglycemia on outpatient blood work.  Patient had melanoma resected in 2017, was found to have hemorrhagic brain metastases last month, was treated with radiation and immunotherapy, has been taking Decadron, and was directed to the ED for evaluation of hyperglycemia on outpatient blood work.  Patient complained of swelling to the bilateral feet.  Patient reports recent bilateral leg swelling.  He was seen in the emergency department a couple days ago for this, was given Lasix, and reports improvement.  He has also had trouble recently with hyperglycemia, likely secondary to Decadron, and reports having outpatient blood work that was concerning for hyperglycemia.  Patient denies any chest pain.  He has been short of breath, but has not noted any acute change in this.  ED Course: Upon arrival to the ED, patient is found to be afebrile, saturating upper 80s to low 90s on room air, and with stable blood pressure.  EKG features a sinus rhythm with LBBB.  Chemistry panel is notable for mild elevation in transaminases and elevated BUN to creatinine ratio.  CBC features a thrombocytopenia with platelets 112,000.  Lactic acid is elevated to 2.9.  CTA chest is concerning for pulmonary emboli involving right pulmonary artery with possible right heart strain, probable necrotic mass on the right, possible pneumonia in the left upper lobe, and right adrenal nodule.  Patient was given a liter of normal saline in the ED.  COVID-19 testing is in process.  Review of Systems:  All other systems reviewed and apart  from HPI, are negative.  Past Medical History:  Diagnosis Date   Anxiety    Cancer ([place]) 2017   melanoma on scalp   History of chicken pox    Hyperlipidemia    Hypertension    Right inguinal hernia     Past Surgical History:  Procedure Laterality Date   APPLICATION OF A-CELL OF EXTREMITY N/A 07/13/2016   Procedure: APPLICATION OF A-CELL;  Surgeon: Wallace Going, DO;  Location: [place];  Service: Plastics;  Laterality: N/A;   APPLICATION OF A-CELL OF HEAD/NECK N/A 06/28/2016   Procedure: APPLICATION OF A-CELL OF HEAD/NECK;  Surgeon: Wallace Going, DO;  Location: [place];  Service: Plastics;  Laterality: N/A;   APPLICATION OF A-CELL OF HEAD/NECK N/A 07/26/2016   Procedure: APPLICATION OF ACELL OF HEAD;  Surgeon: Wallace Going, DO;  Location: [place];  Service: Plastics;  Laterality: N/A;   APPLICATION OF A-CELL OF HEAD/NECK N/A 08/31/2016   Procedure: APPLICATION OF A-CELL OF HEAD/NECK;  Surgeon: Wallace Going, DO;  Location: [place];  Service: Plastics;  Laterality: N/A;   CATARACT EXTRACTION, BILATERAL     CHOLECYSTECTOMY     ENDARTERECTOMY  06/20/11   ENDARTERECTOMY  06/20/2011   Procedure: ENDARTERECTOMY CAROTID;  Surgeon: Elam Dutch, MD;  Location: [facility];  Service: Vascular;  Laterality: Right;  Right Carotid endarterectomy with dacron patch angioplasty    INGUINAL HERNIA REPAIR Right 08/16/2018   Procedure: RIGHT INGUINAL HERNIA REPAIR WITH MESH;  Surgeon: Georganna Skeans, MD;  Location: [place];  Service: General;  Laterality: Right;   MASS EXCISION N/A 06/28/2016   Procedure: EXCISION SCALP MELANOMA;  Surgeon: Wallace Going, DO;  Location: [place];  Service: Plastics;  Laterality: N/A;   MASS EXCISION N/A 07/13/2016   Procedure: RE- EXCISION OF SCALP MELANOMA;  Surgeon: Wallace Going, DO;  Location: [place];  Service: Plastics;  Laterality: N/A;   MASS EXCISION N/A 07/26/2016   Procedure: RE-EXCISION OF SCALP MELANOMA FOR POSITIVE MARGAIN;  Surgeon: Wallace Going, DO;  Location: [place];  Service: Plastics;  Laterality: N/A;   SKIN SPLIT GRAFT Left 08/31/2016   Procedure: SKIN GRAFT SPLIT THICKNESS TO HIS SCALP FROM HIS THIGH;  Surgeon: Wallace Going, DO;  Location: [place];  Service: Plastics;  Laterality: Left;   TONSILLECTOMY     TONSILLECTOMY       reports that he has never smoked. He has never used smokeless tobacco. He reports that he does not drink alcohol or use drugs.  No Known Allergies  Family History  Problem Relation Age of Onset   Heart disease Mother      Prior to Admission medications   Medication Sig Start Date End Date Taking? Authorizing Provider  acetaminophen (TYLENOL) 325 MG tablet Take 2 tablets (650 mg total) by mouth every 6 (six) hours as needed for mild pain or headache. 04/02/19   Barb Merino, MD  ALPRAZolam Duanne Moron) 0.5 MG tablet Take 0.5 mg by mouth 3 (three) times daily as needed for anxiety.    [provider]  amLODipine (NORVASC) 5 MG tablet Take 1 tablet (5 mg total) by mouth daily. 04/05/19 05/10/19  Terrilee Croak, MD  cetirizine (ZYRTEC) 10 MG tablet Take 1 tablet (10 mg total) by mouth daily. 06/19/18   Isaac Bliss, Rayford Halsted, MD  dexamethasone (DECADRON) 4 MG tablet Take 1 tablet (4 mg total) by mouth 2 (two) times daily. For one month, then we will taper to 2 mg BID 05/02/19 06/01/19  Tyler Pita, MD  fluconazole (DIFLUCAN) 100 MG tablet Take 1 tablet (100 mg total) by mouth daily. 05/05/19   Heilingoetter, Cassandra L, PA-C  furosemide (LASIX) 20 MG tablet Take 1 tablet (20 mg total) by mouth daily for 5 days. 05/11/19 05/16/19  Julianne Rice, MD  lisinopril (PRINIVIL,ZESTRIL) 40 MG tablet Take 1 tablet (40 mg total) by mouth daily. 09/19/18   Isaac Bliss, Rayford Halsted, MD    magic mouthwash w/lidocaine SOLN Take 5 mLs by mouth 4 (four) times daily as needed for mouth pain. Patient not taking: Reported on 05/05/2019 04/24/19   Isaac Bliss, Rayford Halsted, MD  magic mouthwash w/lidocaine SOLN Swish and swallow 5 ml by mouth 4 times daily. Lidocaine, Maalox, Benadryl Patient not taking: Reported on 05/05/2019 05/02/19   Tyler Pita, MD  magic mouthwash w/lidocaine SOLN Take 5 mLs by mouth 4 (four) times daily as needed for mouth pain (swish, gargle and spit out or swallow, prior to meals and at bedtime). Patient not taking: Reported on 05/10/2019 05/08/19   Isaac Bliss, Rayford Halsted, MD  Multiple Vitamins-Minerals (CENTRUM SILVER PO) Take 1 tablet by mouth daily.     [provider]  nystatin (MYCOSTATIN/NYSTOP) powder Apply topically 2 (two) times daily. To groin Patient not taking: Reported on 05/05/2019 04/24/19   Isaac Bliss, Rayford Halsted, MD  potassium chloride (KLOR-CON) 10 MEQ tablet Take 1 tablet (10 mEq total) by mouth daily. 05/11/19   Julianne Rice, MD  propranolol Alphia Moh)  20 MG tablet Take 40 mg by mouth 2 (two) times daily.    [provider]  triamcinolone (KENALOG) 0.1 % paste Use as directed 1 application in the mouth or throat every 3 (three) hours as needed (mouth sores).  05/07/19   [provider]  vitamin B-12 (CYANOCOBALAMIN) 1000 MCG tablet Take 1 tablet (1,000 mcg total) by mouth daily. 04/02/19   Barb Merino, MD    Physical Exam: Vitals:   05/12/19 1800 05/12/19 1843 05/12/19 1900 05/12/19 1901  BP: 124/70 (!) 123/59 130/76 130/76  Pulse: 69 87 73 88  Resp: 14   15  Temp:      TempSrc:      SpO2: (!) 89% (!) 88% 92% 91%  Weight:      Height:        Constitutional: NAD, calm  Eyes: PERTLA, lids and conjunctivae normal ENMT: Mucous membranes are moist. Posterior pharynx clear of any exudate or lesions.   Neck: normal, supple, no masses, no thyromegaly Respiratory: no wheezing, no crackles. Normal  respiratory effort. No accessory muscle use.  Cardiovascular: S1 & S2 heard, regular rate and rhythm. Pretibial pitting edema bilaterally. Abdomen: No distension, no tenderness, soft. Bowel sounds active.  Musculoskeletal: no clubbing / cyanosis. No joint deformity upper and lower extremities.    Skin: no significant rashes, lesions, ulcers. Warm, dry, well-perfused. Neurologic: No gross facial asymmetry. Sensation intact. Moving all extremities.  Psychiatric: Alert and oriented to person and place, but not oriented to month or year. Calm, cooperative.    Labs on Admission: I have personally reviewed following labs and imaging studies  CBC: Recent Labs  Lab 05/10/19 2013 05/12/19 1537  WBC 9.4 10.1  NEUTROABS 7.5 8.3*  HGB 13.3 14.0  HCT 39.9 41.3  MCV 100.8* 98.8  PLT 129* [id number]*   Basic Metabolic Panel: Recent Labs  Lab 05/10/19 2013 05/12/19 1537  NA 138 138  K 4.4 4.8  CL 102 97*  CO2 27 29  GLUCOSE 463* 434*  BUN 38* 39*  CREATININE 0.98 0.99  CALCIUM 9.0 9.3   GFR: Estimated Creatinine Clearance: 68.6 mL/min (by C-G formula based on SCr of 0.99 mg/dL). Liver Function Tests: Recent Labs  Lab 05/10/19 2013 05/12/19 1537  AST 41 48*  ALT 83* 86*  ALKPHOS 43 46  BILITOT 0.6 1.1  PROT 5.5* 5.7*  ALBUMIN 3.0* 2.9*   No results for input(s): LIPASE, AMYLASE in the last 168 hours. No results for input(s): AMMONIA in the last 168 hours. Coagulation Profile: No results for input(s): INR, PROTIME in the last 168 hours. Cardiac Enzymes: No results for input(s): CKTOTAL, CKMB, CKMBINDEX, TROPONINI in the last 168 hours. BNP (last 3 results) No results for input(s): PROBNP in the last 8760 hours. HbA1C: No results for input(s): HGBA1C in the last 72 hours. CBG: Recent Labs  Lab 05/10/19 2218 05/12/19 1510 05/12/19 1859  GLUCAP 325* 428* 318*   Lipid Profile: No results for input(s): CHOL, HDL, LDLCALC, TRIG, CHOLHDL, LDLDIRECT in the last 72 hours. Thyroid  Function Tests: No results for input(s): TSH, T4TOTAL, FREET4, T3FREE, THYROIDAB in the last 72 hours. Anemia Panel: No results for input(s): VITAMINB12, FOLATE, FERRITIN, TIBC, IRON, RETICCTPCT in the last 72 hours. Urine analysis:    Component Value Date/Time   COLORURINE YELLOW 05/12/2019 1736   APPEARANCEUR CLEAR 05/12/2019 1736   LABSPEC 1.020 05/12/2019 1736   PHURINE 5.0 05/12/2019 1736   GLUCOSEU >=500 (A) 05/12/2019 1736   HGBUR SMALL (A) 05/12/2019 1736  BILIRUBINUR NEGATIVE 05/12/2019 1736   BILIRUBINUR n 07/20/2015 0931   KETONESUR NEGATIVE 05/12/2019 1736   PROTEINUR NEGATIVE 05/12/2019 1736   UROBILINOGEN 0.2 07/20/2015 0931   UROBILINOGEN 1.0 06/13/2011 1201   NITRITE NEGATIVE 05/12/2019 1736   LEUKOCYTESUR NEGATIVE 05/12/2019 1736   Sepsis Labs: @LABRCNTIP (procalcitonin:4,lacticidven:4) )No results found for this or any previous visit (from the past 240 hour(s)).   Radiological Exams on Admission: Dg Chest 2 View  Result Date: 05/12/2019 CLINICAL DATA:  Weakness, lower extremity edema, history of metastatic cancer EXAM: CHEST - 2 VIEW COMPARISON:  03/25/2019 FINDINGS: The heart size and mediastinal contours are within normal limits. Unchanged elevation of the left hemidiaphragm without acute appearing airspace opacity. Disc degenerative disease and ankylosis of the thoracic spine. IMPRESSION: Unchanged elevation of the left hemidiaphragm without acute appearing airspace opacity. Electronically Signed   By: Eddie Candle M.D.   On: 05/12/2019 16:59   Ct Head Wo Contrast  Result Date: 05/12/2019 CLINICAL DATA:  Hypoglycemia, pedal edema, history hypertension, melanoma EXAM: CT HEAD WITHOUT CONTRAST TECHNIQUE: Contiguous axial images were obtained from the base of the skull through the vertex without intravenous contrast. Sagittal and coronal MPR images reconstructed from axial data set. COMPARISON:  CT head 03/27/2019 FINDINGS: Brain: Generalized atrophy. Mild ex  vacuo dilatation of the ventricular system. Again identified hyperdense focus at the posterior RIGHT parieto-occipital lobe 2.5 x 2.3 cm consistent with intraparenchymal hemorrhage, decreased in size. Persistent residual surrounding edema. Smaller LEFT except a hemorrhage seen on the previous exam has decreased in size as well, now 1.8 x 1.3 cm and demonstrating a fluid/fluid level. Decreased attenuation and size of previously seen LEFT temporal lobe hemorrhage, now 10 x 9 mm. Small vessel chronic ischemic changes of deep cerebral white matter. No new areas of intracranial hemorrhage, mass lesion or infarction. No extra-axial fluid collections. Vascular: Atherosclerotic calcifications of internal carotid arteries at skull base. Skull: Intact Sinuses/Orbits: Clear Other: N/A IMPRESSION: Atrophy with small vessel chronic ischemic changes of deep cerebral white matter. Interval decrease in sizes of previously seen intraparenchymal hemorrhages in both hemispheres at sites of known hemorrhagic metastases. No new intracranial abnormalities. Electronically Signed   By: Lavonia Dana M.D.   On: 05/12/2019 17:13   Ct Angio Chest Pe W/cm &/or Wo Cm  Result Date: 05/12/2019 CLINICAL DATA:  Bilateral lower extremity swelling. EXAM: CT ANGIOGRAPHY CHEST WITH CONTRAST TECHNIQUE: Multidetector CT imaging of the chest was performed using the standard protocol during bolus administration of intravenous contrast. Multiplanar CT image reconstructions and MIPs were obtained to evaluate the vascular anatomy. CONTRAST:  140mL OMNIPAQUE IOHEXOL 350 MG/ML SOLN COMPARISON:  None. FINDINGS: Cardiovascular: Small filling defects are noted in upper and lower lobe branches of the right pulmonary artery. RV/LV ratio of 1.0 is noted. Normal cardiac size. No pericardial effusion. Atherosclerosis of thoracic aorta is noted without aneurysm or dissection. Mediastinum/Nodes: Thyroid gland and esophagus are unremarkable. Extensive mediastinal  adenopathy is noted. This includes 3 cm subcarinal adenopathy, 2.3 cm prevascular adenopathy, 2.3 cm left paratracheal adenopathy and 12 mm right hilar lymph node. 17 mm left supraclavicular lymph node is noted. Lungs/Pleura: No pneumothorax is noted. Left upper lobe atelectasis or infiltrate is noted. 36 x 28 mm probable necrotic mass is noted in the right posterior costophrenic sulcus. Upper Abdomen: 16 mm right adrenal nodule is noted. Musculoskeletal: No chest wall abnormality. No acute or significant osseous findings. Review of the MIP images confirms the above findings. IMPRESSION: Small filling defects are noted in upper and  lower lobe branches of right pulmonary artery consistent with small pulmonary emboli. Positive for acute PE with CT evidence of right heart strain (RV/LV Ratio = 1.0) consistent with at least submassive (intermediate risk) PE. The presence of right heart strain has been associated with an increased risk of morbidity and mortality. Please activate Code PE by paging [phone]. 3.6 x 2.8 cm probable necrotic mass is noted in the right posterior costophrenic sulcus of the right lung concerning for malignancy. Right hilar, left supraclavicular, left paratracheal, subcarinal and prevascular adenopathy is noted concerning for metastatic disease. Critical Value/emergent results were called by telephone at the time of interpretation on 05/12/2019 at 7:02 pm to providerRACHEL LITTLE , who verbally acknowledged these results. Mild left upper lobe atelectasis or infiltrate is noted. 16 mm right adrenal nodule is noted. MRI is recommended to rule out metastatic disease. Aortic Atherosclerosis (ICD10-I70.0). Electronically Signed   By: Marijo Conception M.D.   On: 05/12/2019 19:02   Vas [place] Lower Extremity Venous (dvt) (mc And Wl 7a-7p)  Result Date: 05/12/2019  Lower Venous Study Indications: Swelling.  Comparison Study: No prior study. Performing Technologist: Maudry Mayhew MHA, RDMS, RVT,  RDCS  Examination Guidelines: A complete evaluation includes B-mode imaging, spectral Doppler, color Doppler, and power Doppler as needed of all accessible portions of each vessel. Bilateral testing is considered an integral part of a complete examination. Limited examinations for reoccurring indications may be performed as noted.  +---------+---------------+---------+-----------+----------+--------------+  RIGHT     Compressibility Phasicity Spontaneity Properties Thrombus Aging  +---------+---------------+---------+-----------+----------+--------------+  CFV       Full            Yes       Yes                                    +---------+---------------+---------+-----------+----------+--------------+  SFJ       Full                                                             +---------+---------------+---------+-----------+----------+--------------+  FV Prox   Full                                                             +---------+---------------+---------+-----------+----------+--------------+  FV Mid    Full                                                             +---------+---------------+---------+-----------+----------+--------------+  FV Distal Full                                                             +---------+---------------+---------+-----------+----------+--------------+  PFV  Full                                                             +---------+---------------+---------+-----------+----------+--------------+  POP       Full            Yes       Yes                                    +---------+---------------+---------+-----------+----------+--------------+  PTV       Full                                                             +---------+---------------+---------+-----------+----------+--------------+  PERO      Full                                                             +---------+---------------+---------+-----------+----------+--------------+   +---------+---------------+---------+-----------+----------+-----------------+  LEFT      Compressibility Phasicity Spontaneity Properties Thrombus Aging     +---------+---------------+---------+-----------+----------+-----------------+  CFV       Full            Yes       Yes                                       +---------+---------------+---------+-----------+----------+-----------------+  SFJ       Full                                                                +---------+---------------+---------+-----------+----------+-----------------+  FV Prox   Full                                                                +---------+---------------+---------+-----------+----------+-----------------+  FV Mid    None                      No                     Acute              +---------+---------------+---------+-----------+----------+-----------------+  FV Distal None                      No  Acute              +---------+---------------+---------+-----------+----------+-----------------+  PFV       Full                                                                +---------+---------------+---------+-----------+----------+-----------------+  POP       Partial                   Yes                    Age Indeterminate  +---------+---------------+---------+-----------+----------+-----------------+  PTV       None                      No                     Acute              +---------+---------------+---------+-----------+----------+-----------------+  PERO      Full                                                                +---------+---------------+---------+-----------+----------+-----------------+  Summary: Right: There is no evidence of deep vein thrombosis in the lower extremity. No cystic structure found in the popliteal fossa. Left: Findings consistent with acute deep vein thrombosis involving the left femoral vein, and left posterior tibial veins. Findings consistent with age  indeterminate deep vein thrombosis involving the left popliteal vein. No cystic structure found in the popliteal fossa.  *See table(s) above for measurements and observations.    Preliminary     EKG: Independently reviewed. Sinus rhythm, LBBB, similar to prior.   Assessment/Plan   1. Acute PE; left leg DVT  - Presents with leg swelling and hyperglycemia, found to have LLE DVT and acute PE with RV:LV of 1.0  - This is a difficult scenario given the hemorrhagic brain mets, appreciate neurology input  - Hold anticoagulation for now in light risks, check troponin and BNP, obtain echocardiogram, continue supportive care    2. Metastatic melanoma; hemorrhagic brain metastases; right lung mass; right adrenal nodule  - Patient has hx of metastatic melanoma s/p Mohs surgery in 2017, found to have brain and lung lesions recently, underwent radiation and immunotherapy  - CT head and chest in ED with interval decrease in size of hemorrhagic brain mets, probable necrotic mass involving right lung, and right adrenal nodule  - Continue Decadron, supportive care    3. Hyperglycemia  - Serum glucose is 434 in ED   - A1c was 6.1% in June 2020  - Likely secondary to Decadron  - Start SSI with Novolog   4. Hypertension  - BP at goal  - Continue Norvasc    5. Anxiety  - Continue as-needed Xanax   6. ?Pneumonia  - There is possible LUL pneumonia on chest imaging in ED  - Treat with Rocephin and azithromycin, check cultures, check strep pneumo and legionella antigens     PPE: Mask, face shield  DVT  prophylaxis: SCD's  Code Status: Full, confirmed with family   Family Communication: Son, Jovel Buckalew., updated by phone   Consults called: Discussed with neurology, not formally consulted  Admission status: Inpatient     Vianne Bulls, MD [facility] Pager [phone]  If 7PM-7AM, please contact night-coverage [email or web address] Password [facility]  05/12/2019, 7:36 PM

## 2019-05-12 NOTE — ED Provider Notes (Addendum)
[facility] DEPT Provider Note   CSN: [id number] Arrival date & time: 05/12/19  1457     History   Chief Complaint Chief Complaint  Patient presents with   Hyperglycemia    HPI Randall Mcgee is a 78 y.o. male.     78yo M w/ PMH including metastatic melanoma, hypertension, hyperlipidemia who presents with weakness, hyperglycemia, and confusion.  The patient presented to the ED 2 days ago with a few days of progressively worsening lower extremity edema as well as hyperglycemia.  He was recently started on Decadron by his oncologist for brain mets and this was thought to be the cause of his hyperglycemia as he denies h/o diabetes.  He was given Lasix for his peripheral edema and instructed to follow-up with PCP.  Son called his physician today stating he has had a significant cognitive decline over the past 1 week as well as generalized weakness.  He has been falling out of bed and has been confused.  Patient himself states that he feels generally weak and has been sliding down when he tries to walk but denies any hard falls.  He has been urinating more since starting Lasix.  He denies polydipsia.  He denies any cough, fevers, shortness of breath, vomiting, diarrhea, or complaints of pain. HE was noted to be hyperglycemic today by home health and sent here for further eval.   LEVEL 5 CAVEAT DUE TO CONFUSION  The history is provided by the patient and a relative.  Hyperglycemia   Past Medical History:  Diagnosis Date   Anxiety    Cancer ([place]) 2017   melanoma on scalp   History of chicken pox    Hyperlipidemia    Hypertension    Right inguinal hernia     Patient Active Problem List   Diagnosis Date Noted   Left leg DVT ([place]) 05/12/2019   Acute pulmonary embolism ([place]) 05/12/2019   Mass of right lung 05/12/2019   Encounter for antineoplastic immunotherapy 05/05/2019   Goals of care, counseling/discussion 05/05/2019   Brain  metastases ([place]) 03/26/2019   ICH (intracerebral hemorrhage) ([place]) 03/25/2019   Erectile dysfunction 06/19/2018   Anxiety state 06/19/2018   Status post split thickness skin graft 09/08/2016   Melanoma of skin ([place]) 07/13/2016   Malignant melanoma of scalp ([place]) 05/29/2016   Elevated TSH 07/27/2015   Impaired glucose tolerance 07/27/2015   Carotid artery stenosis 11/07/2011   Right inguinal hernia 11/07/2011   Hypertension 10/03/2010   Hypercholesterolemia 10/03/2010    Past Surgical History:  Procedure Laterality Date   APPLICATION OF A-CELL OF EXTREMITY N/A 07/13/2016   Procedure: APPLICATION OF A-CELL;  Surgeon: Wallace Going, DO;  Location: [facility];  Service: Plastics;  Laterality: N/A;   APPLICATION OF A-CELL OF HEAD/NECK N/A 06/28/2016   Procedure: APPLICATION OF A-CELL OF HEAD/NECK;  Surgeon: Wallace Going, DO;  Location: [facility];  Service: Plastics;  Laterality: N/A;   APPLICATION OF A-CELL OF HEAD/NECK N/A 07/26/2016   Procedure: APPLICATION OF ACELL OF HEAD;  Surgeon: Wallace Going, DO;  Location: [facility];  Service: Plastics;  Laterality: N/A;   APPLICATION OF A-CELL OF HEAD/NECK N/A 08/31/2016   Procedure: APPLICATION OF A-CELL OF HEAD/NECK;  Surgeon: Wallace Going, DO;  Location: [facility];  Service: Plastics;  Laterality: N/A;   CATARACT EXTRACTION, BILATERAL     CHOLECYSTECTOMY     ENDARTERECTOMY  06/20/11   ENDARTERECTOMY  06/20/2011  Procedure: ENDARTERECTOMY CAROTID;  Surgeon: Elam Dutch, MD;  Location: [facility] OR;  Service: Vascular;  Laterality: Right;  Right Carotid endarterectomy with dacron patch angioplasty    INGUINAL HERNIA REPAIR Right 08/16/2018   Procedure: RIGHT INGUINAL HERNIA REPAIR WITH MESH;  Surgeon: Georganna Skeans, MD;  Location: [facility];  Service: General;  Laterality: Right;   MASS EXCISION N/A 06/28/2016   Procedure:  EXCISION SCALP MELANOMA;  Surgeon: Wallace Going, DO;  Location: [facility];  Service: Plastics;  Laterality: N/A;   MASS EXCISION N/A 07/13/2016   Procedure: RE- EXCISION OF SCALP MELANOMA;  Surgeon: Wallace Going, DO;  Location: [facility];  Service: Plastics;  Laterality: N/A;   MASS EXCISION N/A 07/26/2016   Procedure: RE-EXCISION OF SCALP MELANOMA FOR POSITIVE MARGAIN;  Surgeon: Wallace Going, DO;  Location: [facility];  Service: Plastics;  Laterality: N/A;   SKIN SPLIT GRAFT Left 08/31/2016   Procedure: SKIN GRAFT SPLIT THICKNESS TO HIS SCALP FROM HIS THIGH;  Surgeon: Wallace Going, DO;  Location: [facility];  Service: Plastics;  Laterality: Left;   TONSILLECTOMY     TONSILLECTOMY          Home Medications    Prior to Admission medications   Medication Sig Start Date End Date Taking? Authorizing Provider  acetaminophen (TYLENOL) 325 MG tablet Take 2 tablets (650 mg total) by mouth every 6 (six) hours as needed for mild pain or headache. 04/02/19   Barb Merino, MD  ALPRAZolam Duanne Moron) 0.5 MG tablet Take 0.5 mg by mouth 3 (three) times daily as needed for anxiety.    [provider]  amLODipine (NORVASC) 5 MG tablet Take 1 tablet (5 mg total) by mouth daily. 04/05/19 05/10/19  Terrilee Croak, MD  cetirizine (ZYRTEC) 10 MG tablet Take 1 tablet (10 mg total) by mouth daily. 06/19/18   Isaac Bliss, Rayford Halsted, MD  dexamethasone (DECADRON) 4 MG tablet Take 1 tablet (4 mg total) by mouth 2 (two) times daily. For one month, then we will taper to 2 mg BID 05/02/19 06/01/19  Tyler Pita, MD  fluconazole (DIFLUCAN) 100 MG tablet Take 1 tablet (100 mg total) by mouth daily. 05/05/19   Heilingoetter, Cassandra L, PA-C  furosemide (LASIX) 20 MG tablet Take 1 tablet (20 mg total) by mouth daily for 5 days. 05/11/19 05/16/19  Julianne Rice, MD  lisinopril (PRINIVIL,ZESTRIL) 40 MG tablet Take 1 tablet  (40 mg total) by mouth daily. 09/19/18   Isaac Bliss, Rayford Halsted, MD  magic mouthwash w/lidocaine SOLN Take 5 mLs by mouth 4 (four) times daily as needed for mouth pain. Patient not taking: Reported on 05/05/2019 04/24/19   Isaac Bliss, Rayford Halsted, MD  magic mouthwash w/lidocaine SOLN Swish and swallow 5 ml by mouth 4 times daily. Lidocaine, Maalox, Benadryl Patient not taking: Reported on 05/05/2019 05/02/19   Tyler Pita, MD  magic mouthwash w/lidocaine SOLN Take 5 mLs by mouth 4 (four) times daily as needed for mouth pain (swish, gargle and spit out or swallow, prior to meals and at bedtime). Patient not taking: Reported on 05/10/2019 05/08/19   Isaac Bliss, Rayford Halsted, MD  Multiple Vitamins-Minerals (CENTRUM SILVER PO) Take 1 tablet by mouth daily.     [provider]  nystatin (MYCOSTATIN/NYSTOP) powder Apply topically 2 (two) times daily. To groin Patient not taking: Reported on 05/05/2019 04/24/19   Isaac Bliss, Rayford Halsted, MD  potassium chloride (KLOR-CON) 10 MEQ tablet Take  1 tablet (10 mEq total) by mouth daily. 05/11/19   Julianne Rice, MD  propranolol (INDERAL) 20 MG tablet Take 40 mg by mouth 2 (two) times daily.    [provider]  triamcinolone (KENALOG) 0.1 % paste Use as directed 1 application in the mouth or throat every 3 (three) hours as needed (mouth sores).  05/07/19   [provider]  vitamin B-12 (CYANOCOBALAMIN) 1000 MCG tablet Take 1 tablet (1,000 mcg total) by mouth daily. 04/02/19   Barb Merino, MD    Family History Family History  Problem Relation Age of Onset   Heart disease Mother     Social History Social History   Tobacco Use   Smoking status: Never Smoker   Smokeless tobacco: Never Used  Substance Use Topics   Alcohol use: No   Drug use: No    Types: Other-see comments     Allergies   Patient has no known allergies.   Review of Systems Review of Systems  Unable to perform ROS: Mental status  change     Physical Exam Updated Vital Signs BP 130/76    Pulse 88    Temp (!) 97.3 F (36.3 C) (Oral)    Resp 15    Ht 5\' 10"  (1.778 m)    Wt 94.3 kg    SpO2 91%    BMI 29.84 kg/m   Physical Exam Vitals signs and nursing note reviewed.  Constitutional:      General: He is not in acute distress.    Appearance: He is well-developed.     Comments: Frail  HENT:     Head: Normocephalic and atraumatic.     Mouth/Throat:     Mouth: Mucous membranes are dry.  Eyes:     Conjunctiva/sclera: Conjunctivae normal.     Pupils: Pupils are equal, round, and reactive to light.  Neck:     Musculoskeletal: Neck supple.  Cardiovascular:     Rate and Rhythm: Normal rate and regular rhythm.     Heart sounds: Normal heart sounds. No murmur.  Pulmonary:     Effort: Pulmonary effort is normal.     Breath sounds: Normal breath sounds.  Abdominal:     General: Bowel sounds are normal. There is no distension.     Palpations: Abdomen is soft.     Tenderness: There is no abdominal tenderness.  Musculoskeletal:     Right lower leg: Edema present.     Left lower leg: Edema present.     Comments: 2+ pitting edema BLE  Skin:    General: Skin is warm and dry.  Neurological:     Mental Status: He is alert.     Comments: Fluent speech, oriented to person and place, following basic commands      ED Treatments / Results  Labs (all labs ordered are listed, but only abnormal results are displayed) Labs Reviewed  COMPREHENSIVE METABOLIC PANEL - Abnormal; Notable for the following components:      Result Value   Chloride 97 (*)    Glucose, Bld 434 (*)    BUN 39 (*)    Total Protein 5.7 (*)    Albumin 2.9 (*)    AST 48 (*)    ALT 86 (*)    All other components within normal limits  BRAIN NATRIURETIC PEPTIDE - Abnormal; Notable for the following components:   B Natriuretic Peptide 119.6 (*)    All other components within normal limits  LACTIC ACID, PLASMA - Abnormal; Notable for  the following  components:   Lactic Acid, Venous 2.9 (*)    All other components within normal limits  CBC WITH DIFFERENTIAL/PLATELET - Abnormal; Notable for the following components:   RBC 4.18 (*)    Platelets 112 (*)    Neutro Abs 8.3 (*)    Abs Immature Granulocytes 0.58 (*)    All other components within normal limits  BLOOD GAS, VENOUS - Abnormal; Notable for the following components:   Bicarbonate 34.3 (*)    Acid-Base Excess 8.3 (*)    All other components within normal limits  URINALYSIS, ROUTINE W REFLEX MICROSCOPIC - Abnormal; Notable for the following components:   Glucose, UA >=500 (*)    Hgb urine dipstick SMALL (*)    All other components within normal limits  CBG MONITORING, ED - Abnormal; Notable for the following components:   Glucose-Capillary 428 (*)    All other components within normal limits  CBG MONITORING, ED - Abnormal; Notable for the following components:   Glucose-Capillary 318 (*)    All other components within normal limits  SARS CORONAVIRUS 2 (TAT 6-24 HRS)  LACTIC ACID, PLASMA    EKG None Sinus rhythm, rate 77, LBBB which is present on previous tracings  Radiology Dg Chest 2 View  Result Date: 05/12/2019 CLINICAL DATA:  Weakness, lower extremity edema, history of metastatic cancer EXAM: CHEST - 2 VIEW COMPARISON:  03/25/2019 FINDINGS: The heart size and mediastinal contours are within normal limits. Unchanged elevation of the left hemidiaphragm without acute appearing airspace opacity. Disc degenerative disease and ankylosis of the thoracic spine. IMPRESSION: Unchanged elevation of the left hemidiaphragm without acute appearing airspace opacity. Electronically Signed   By: Eddie Candle M.D.   On: 05/12/2019 16:59   Ct Head Wo Contrast  Result Date: 05/12/2019 CLINICAL DATA:  Hypoglycemia, pedal edema, history hypertension, melanoma EXAM: CT HEAD WITHOUT CONTRAST TECHNIQUE: Contiguous axial images were obtained from the base of the skull through the vertex  without intravenous contrast. Sagittal and coronal MPR images reconstructed from axial data set. COMPARISON:  CT head 03/27/2019 FINDINGS: Brain: Generalized atrophy. Mild ex vacuo dilatation of the ventricular system. Again identified hyperdense focus at the posterior RIGHT parieto-occipital lobe 2.5 x 2.3 cm consistent with intraparenchymal hemorrhage, decreased in size. Persistent residual surrounding edema. Smaller LEFT except a hemorrhage seen on the previous exam has decreased in size as well, now 1.8 x 1.3 cm and demonstrating a fluid/fluid level. Decreased attenuation and size of previously seen LEFT temporal lobe hemorrhage, now 10 x 9 mm. Small vessel chronic ischemic changes of deep cerebral white matter. No new areas of intracranial hemorrhage, mass lesion or infarction. No extra-axial fluid collections. Vascular: Atherosclerotic calcifications of internal carotid arteries at skull base. Skull: Intact Sinuses/Orbits: Clear Other: N/A IMPRESSION: Atrophy with small vessel chronic ischemic changes of deep cerebral white matter. Interval decrease in sizes of previously seen intraparenchymal hemorrhages in both hemispheres at sites of known hemorrhagic metastases. No new intracranial abnormalities. Electronically Signed   By: Lavonia Dana M.D.   On: 05/12/2019 17:13   Ct Angio Chest Pe W/cm &/or Wo Cm  Result Date: 05/12/2019 CLINICAL DATA:  Bilateral lower extremity swelling. EXAM: CT ANGIOGRAPHY CHEST WITH CONTRAST TECHNIQUE: Multidetector CT imaging of the chest was performed using the standard protocol during bolus administration of intravenous contrast. Multiplanar CT image reconstructions and MIPs were obtained to evaluate the vascular anatomy. CONTRAST:  138mL OMNIPAQUE IOHEXOL 350 MG/ML SOLN COMPARISON:  None. FINDINGS: Cardiovascular: Small filling defects are noted  in upper and lower lobe branches of the right pulmonary artery. RV/LV ratio of 1.0 is noted. Normal cardiac size. No pericardial  effusion. Atherosclerosis of thoracic aorta is noted without aneurysm or dissection. Mediastinum/Nodes: Thyroid gland and esophagus are unremarkable. Extensive mediastinal adenopathy is noted. This includes 3 cm subcarinal adenopathy, 2.3 cm prevascular adenopathy, 2.3 cm left paratracheal adenopathy and 12 mm right hilar lymph node. 17 mm left supraclavicular lymph node is noted. Lungs/Pleura: No pneumothorax is noted. Left upper lobe atelectasis or infiltrate is noted. 36 x 28 mm probable necrotic mass is noted in the right posterior costophrenic sulcus. Upper Abdomen: 16 mm right adrenal nodule is noted. Musculoskeletal: No chest wall abnormality. No acute or significant osseous findings. Review of the MIP images confirms the above findings. IMPRESSION: Small filling defects are noted in upper and lower lobe branches of right pulmonary artery consistent with small pulmonary emboli. Positive for acute PE with CT evidence of right heart strain (RV/LV Ratio = 1.0) consistent with at least submassive (intermediate risk) PE. The presence of right heart strain has been associated with an increased risk of morbidity and mortality. Please activate Code PE by paging [phone]. 3.6 x 2.8 cm probable necrotic mass is noted in the right posterior costophrenic sulcus of the right lung concerning for malignancy. Right hilar, left supraclavicular, left paratracheal, subcarinal and prevascular adenopathy is noted concerning for metastatic disease. Critical Value/emergent results were called by telephone at the time of interpretation on 05/12/2019 at 7:02 pm to providerRACHEL Praveen Coia , who verbally acknowledged these results. Mild left upper lobe atelectasis or infiltrate is noted. 16 mm right adrenal nodule is noted. MRI is recommended to rule out metastatic disease. Aortic Atherosclerosis (ICD10-I70.0). Electronically Signed   By: Marijo Conception M.D.   On: 05/12/2019 19:02   Vas [place] Lower Extremity Venous (dvt) (mc And Wl  7a-7p)  Result Date: 05/12/2019  Lower Venous Study Indications: Swelling.  Comparison Study: No prior study. Performing Technologist: Maudry Mayhew MHA, RDMS, RVT, RDCS  Examination Guidelines: A complete evaluation includes B-mode imaging, spectral Doppler, color Doppler, and power Doppler as needed of all accessible portions of each vessel. Bilateral testing is considered an integral part of a complete examination. Limited examinations for reoccurring indications may be performed as noted.  +---------+---------------+---------+-----------+----------+--------------+  RIGHT     Compressibility Phasicity Spontaneity Properties Thrombus Aging  +---------+---------------+---------+-----------+----------+--------------+  CFV       Full            Yes       Yes                                    +---------+---------------+---------+-----------+----------+--------------+  SFJ       Full                                                             +---------+---------------+---------+-----------+----------+--------------+  FV Prox   Full                                                             +---------+---------------+---------+-----------+----------+--------------+  FV Mid    Full                                                             +---------+---------------+---------+-----------+----------+--------------+  FV Distal Full                                                             +---------+---------------+---------+-----------+----------+--------------+  PFV       Full                                                             +---------+---------------+---------+-----------+----------+--------------+  POP       Full            Yes       Yes                                    +---------+---------------+---------+-----------+----------+--------------+  PTV       Full                                                              +---------+---------------+---------+-----------+----------+--------------+  PERO      Full                                                             +---------+---------------+---------+-----------+----------+--------------+  +---------+---------------+---------+-----------+----------+-----------------+  LEFT      Compressibility Phasicity Spontaneity Properties Thrombus Aging     +---------+---------------+---------+-----------+----------+-----------------+  CFV       Full            Yes       Yes                                       +---------+---------------+---------+-----------+----------+-----------------+  SFJ       Full                                                                +---------+---------------+---------+-----------+----------+-----------------+  FV Prox   Full                                                                +---------+---------------+---------+-----------+----------+-----------------+  FV Mid    None                      No                     Acute              +---------+---------------+---------+-----------+----------+-----------------+  FV Distal None                      No                     Acute              +---------+---------------+---------+-----------+----------+-----------------+  PFV       Full                                                                +---------+---------------+---------+-----------+----------+-----------------+  POP       Partial                   Yes                    Age Indeterminate  +---------+---------------+---------+-----------+----------+-----------------+  PTV       None                      No                     Acute              +---------+---------------+---------+-----------+----------+-----------------+  PERO      Full                                                                +---------+---------------+---------+-----------+----------+-----------------+  Summary: Right: There is no evidence of deep vein thrombosis  in the lower extremity. No cystic structure found in the popliteal fossa. Left: Findings consistent with acute deep vein thrombosis involving the left femoral vein, and left posterior tibial veins. Findings consistent with age indeterminate deep vein thrombosis involving the left popliteal vein. No cystic structure found in the popliteal fossa.  *See table(s) above for measurements and observations.    Preliminary     Procedures .Critical Care Performed by: Sharlett Iles, MD Authorized by: Sharlett Iles, MD   Critical care provider statement:    Critical care time (minutes):  30   Critical care time was exclusive of:  Separately billable procedures and treating other patients   Critical care was necessary to treat or prevent imminent or life-threatening deterioration of the following conditions:  Respiratory failure   Critical care was time spent personally by me on the following activities:  Development of treatment plan with patient or surrogate, evaluation of patient's response to treatment, examination of patient, obtaining history from patient or surrogate, ordering and performing treatments and interventions, ordering and review of laboratory studies, ordering and review of radiographic studies, re-evaluation of patient's condition and review of old charts   (including  critical care time)  Medications Ordered in ED Medications  sodium chloride (PF) 0.9 % injection (has no administration in time range)  sodium chloride 0.9 % bolus 1,000 mL (1,000 mLs Intravenous New Bag/Given 05/12/19 1751)  iohexol (OMNIPAQUE) 350 MG/ML injection 100 mL (100 mLs Intravenous Contrast Given 05/12/19 1816)     Initial Impression / Assessment and Plan / ED Course  I have reviewed the triage vital signs and the nursing notes.  Pertinent labs & imaging results that were available during my care of the patient were reviewed by me and considered in my medical decision making (see chart for  details).        Non-toxic on exam, afebrile, stable VS. 91-94% on RA. Labwork shows BG 434 w/ normal AG, normal Cr, BNP 120, lactate 2.9, normal WBC count. VBG normal. LAbs reassuring against DKA. GAve IVF bolus for hyperglycemia which is likely 2/2 steroid use.  Head CT stable from previous.  Obtained ultrasounds of bilateral lower extremities which was positive for left leg DVT.  Because of borderline oxygen saturation, obtained CTA which was positive for right-sided PE with borderline right heart strain.  Patient has been hemodynamically stable here.  Because of his known hemorrhagic brain metastases, anticoagulation would be contraindicated.  I have discussed the possibility of IVC filter and recommended admission for further discussion.  Of note, chest CT also notes multiple areas of metastatic disease.  Discussed admission with [facility] hospitalist, Dr. Myna Hidalgo, appreciate assistance. PT admitted for further care.  Final Clinical Impressions(s) / ED Diagnoses   Final diagnoses:  Other acute pulmonary embolism, unspecified whether acute cor pulmonale present [facility])  Hyperglycemia    ED Discharge Orders    None       Randall Mcgee, Wenda Overland, MD 05/12/19 1930    Rex Kras, Wenda Overland, MD 05/20/19 1347

## 2019-05-12 NOTE — ED Notes (Signed)
Pt transported to CT ?

## 2019-05-12 NOTE — ED Notes (Signed)
X-ray at bedside

## 2019-05-12 NOTE — Progress Notes (Signed)
Patients [name]. Randall Najjar, NP overnight floor cover notified. Will continue to monitor.

## 2019-05-12 NOTE — ED Notes (Addendum)
FSBG=[id number]

## 2019-05-12 NOTE — ED Notes (Signed)
Pt off floor in CT 

## 2019-05-12 NOTE — ED Triage Notes (Signed)
Pt arrived [facility] from home CC abnormal labs per home health nurse I.E Hyperglycemia and new onset pedal edema. Pt denies pain or  dizzyness  Per EMS CBG 535 Afebrile VSS

## 2019-05-12 NOTE — ED Notes (Signed)
[facility] arrived for transport 

## 2019-05-12 NOTE — ED Notes (Signed)
[facility] called for transport. 

## 2019-05-13 ENCOUNTER — Inpatient Hospital Stay: Payer: Medicare Other

## 2019-05-13 ENCOUNTER — Other Ambulatory Visit: Payer: Self-pay | Admitting: Radiation Therapy

## 2019-05-13 ENCOUNTER — Inpatient Hospital Stay (HOSPITAL_COMMUNITY): Payer: Medicare Other

## 2019-05-13 DIAGNOSIS — I2609 Other pulmonary embolism with acute cor pulmonale: Secondary | ICD-10-CM

## 2019-05-13 DIAGNOSIS — I612 Nontraumatic intracerebral hemorrhage in hemisphere, unspecified: Secondary | ICD-10-CM

## 2019-05-13 DIAGNOSIS — C434 Malignant melanoma of scalp and neck: Secondary | ICD-10-CM

## 2019-05-13 DIAGNOSIS — I2699 Other pulmonary embolism without acute cor pulmonale: Principal | ICD-10-CM

## 2019-05-13 DIAGNOSIS — I82412 Acute embolism and thrombosis of left femoral vein: Secondary | ICD-10-CM

## 2019-05-13 DIAGNOSIS — C7931 Secondary malignant neoplasm of brain: Secondary | ICD-10-CM

## 2019-05-13 DIAGNOSIS — T380X5A Adverse effect of glucocorticoids and synthetic analogues, initial encounter: Secondary | ICD-10-CM

## 2019-05-13 LAB — SARS CORONAVIRUS 2 (TAT 6-24 HRS): SARS Coronavirus 2: NEGATIVE

## 2019-05-13 LAB — HEMOGLOBIN A1C
Hgb A1c MFr Bld: 8.8 % — ABNORMAL HIGH (ref 4.8–5.6)
Mean Plasma Glucose: 205.86 mg/dL

## 2019-05-13 LAB — CBC
HCT: 36.3 % — ABNORMAL LOW (ref 39.0–52.0)
Hemoglobin: 12.5 g/dL — ABNORMAL LOW (ref 13.0–17.0)
MCH: 33.4 pg (ref 26.0–34.0)
MCHC: 34.4 g/dL (ref 30.0–36.0)
MCV: 97.1 fL (ref 80.0–100.0)
Platelets: 103 10*3/uL — ABNORMAL LOW (ref 150–400)
RBC: 3.74 MIL/uL — ABNORMAL LOW (ref 4.22–5.81)
RDW: 13.3 % (ref 11.5–15.5)
WBC: 9.5 10*3/uL (ref 4.0–10.5)
nRBC: 0 % (ref 0.0–0.2)

## 2019-05-13 LAB — GLUCOSE, CAPILLARY
Glucose-Capillary: 352 mg/dL — ABNORMAL HIGH (ref 70–99)
Glucose-Capillary: 351 mg/dL — ABNORMAL HIGH (ref 70–99)
Glucose-Capillary: 270 mg/dL — ABNORMAL HIGH (ref 70–99)
Glucose-Capillary: 204 mg/dL — ABNORMAL HIGH (ref 70–99)

## 2019-05-13 LAB — ECHOCARDIOGRAM COMPLETE
Height: 70 in
Weight: 3328 oz

## 2019-05-13 MED ORDER — AMLODIPINE BESYLATE 5 MG PO TABS
5.0000 mg | ORAL_TABLET | Freq: Every day | ORAL | Status: DC
  Administered 2019-05-13 – 2019-05-17 (×5): 5 mg via ORAL
  Filled 2019-05-13 (×5): qty 1

## 2019-05-13 MED ORDER — FLUCONAZOLE 100 MG PO TABS
100.0000 mg | ORAL_TABLET | Freq: Every day | ORAL | Status: DC
  Administered 2019-05-13 – 2019-05-17 (×5): 100 mg via ORAL
  Filled 2019-05-13 (×5): qty 1

## 2019-05-13 MED ORDER — SODIUM CHLORIDE 0.9% FLUSH
3.0000 mL | Freq: Two times a day (BID) | INTRAVENOUS | Status: DC
  Administered 2019-05-13 – 2019-05-17 (×8): 3 mL via INTRAVENOUS

## 2019-05-13 MED ORDER — POLYETHYLENE GLYCOL 3350 17 G PO PACK
17.0000 g | PACK | Freq: Every day | ORAL | Status: DC | PRN
  Filled 2019-05-13: qty 1

## 2019-05-13 MED ORDER — INSULIN GLARGINE 100 UNIT/ML ~~LOC~~ SOLN
10.0000 [IU] | Freq: Every day | SUBCUTANEOUS | Status: DC
  Administered 2019-05-13 – 2019-05-14 (×2): 10 [IU] via SUBCUTANEOUS
  Filled 2019-05-13 (×3): qty 0.1

## 2019-05-13 NOTE — Progress Notes (Addendum)
HEMATOLOGY-ONCOLOGY PROGRESS NOTE  SUBJECTIVE: [name] is currently under the care of Dr. Julien Nordmann for metastatic melanoma.  He recently completed SBRT to the multiple brain metastasis under the care of Dr. Tyler Pita.  He is currently under consideration to begin immunotherapy with ipilimumab and nivolumab followed by maintenance nivolumab.  He was scheduled to start this today, 05/13/2019.  He was also supposed to have a PET scan performed on 05/15/2019, but this has been canceled since the patient has been admitted.  The patient presented to the emergency room with leg swelling.  In the emergency room, his O2 sats were in the upper 80s to low 90s on room air.  CBC revealed mild thrombocytopenia with a platelet count of 112,000, elevated glucose at 434, evaded BUN at 39, low albumin at 2.9, elevated AST and ALT at 48 and 86 respectively.  Normal total bilirubin.  Patient had a CT angiogram of the chest which showed small filling defects noted in the upper and lower branches of the right pulmonary artery consistent with small pulmonary emboli, evidence for right heart strain consistent with at least submassive PE.  There was a 3.6 x 2.8 cm probable necrotic mass noted in the right posterior costophrenic sulcus of the right lung concerning for malignancy, right hilar left supraclavicular, left paratracheal, subcarinal and prevascular adenopathy noted concerning for metastatic disease.  The patient had bilateral Doppler ultrasounds performed and there was no evidence of DVT on the right, but on the left findings were consistent with acute DVT involving the left femoral vein and left posterior tibial veins and also findings consistent with age indeterminant DVT involving the left popliteal vein. He had a CT scan of the head which showed atrophy with small vessel chronic ischemic changes of deep cerebral white matter, interval decrease in size of previously seen intraparenchymal hemorrhages in both hemispheres  at sites of known hemorrhagic metastases, no new intracranial abnormalities.  In consultation with neurology, the decision was made to hold on anticoagulation given his hemorrhagic brain metastasis.  When seen today, the patient reports ongoing swelling in his bilateral lower extremities.  He is not currently having any problems with chest pain or shortness of breath.  He is not requiring any oxygen at this time.  Denies headaches and dizziness.  Denies abdominal pain, nausea, vomiting.  Oncology History  Malignant melanoma of scalp ([place])  05/29/2016 Initial Diagnosis   Malignant melanoma of scalp ([place])   05/13/2019 -  Chemotherapy   The patient had ipilimumab (YERVOY) 270 mg in sodium chloride 0.9 % 100 mL chemo infusion, 3 mg/kg = 270 mg, Intravenous,  Once, 0 of 4 cycles nivolumab (OPDIVO) 90 mg in sodium chloride 0.9 % 50 mL chemo infusion, 1 mg/kg = 90 mg, Intravenous, Once, 0 of 10 cycles  for chemotherapy treatment.       REVIEW OF SYSTEMS:   Constitutional: Denies fevers, chills Respiratory: Denies cough, dyspnea or wheezes Cardiovascular: Denies palpitation, chest discomfort Gastrointestinal:  Denies nausea, heartburn or change in bowel habits Skin: Denies abnormal skin rashes Lymphatics: Denies new lymphadenopathy or easy bruising Neurological:Denies numbness, tingling or new weaknesses Behavioral/Psych: Mood is stable, no new changes  Extremities: Reports lower extremity edema All other systems were reviewed with the patient and are negative.  I have reviewed the past medical history, past surgical history, social history and family history with the patient and they are unchanged from previous note.   PHYSICAL EXAMINATION: ECOG PERFORMANCE STATUS: 2 - Symptomatic, <50% confined to bed  Vitals:   05/13/19 0552 05/13/19 0735  BP: 129/80 130/82  Pulse: 70 66  Resp: 18   Temp: 97.8 F (36.6 C) 97.7 F (36.5 C)  SpO2: 92% 92%   Filed Weights   05/12/19 1511   Weight: 208 lb (94.3 kg)    Intake/Output from previous day: 10/19 0701 - 10/20 0700 In: 1152.1 [I.V.:52.1; IV Piggyback:1100] Out: 300 [Urine:300]  GENERAL:alert, no distress and comfortable SKIN: skin color, texture, turgor are normal, no rashes or significant lesions EYES: normal, Conjunctiva are pink and non-injected, sclera clear OROPHARYNX: Thrush noted to tongue LUNGS: clear to auscultation and percussion with normal breathing effort HEART: regular rate & rhythm and no murmurs.  2+ pitting edema in the bilateral lower extremities. ABDOMEN:abdomen soft, non-tender and normal bowel sounds Musculoskeletal:no cyanosis of digits and no clubbing  NEURO: alert & oriented x 3 with fluent speech, no focal motor/sensory deficits  LABORATORY DATA:  I have reviewed the data as listed CMP Latest Ref Rng & Units 05/12/2019 05/10/2019 05/05/2019  Glucose 70 - 99 mg/dL 434(H) 463(H) 333(H)  BUN 8 - 23 mg/dL 39(H) 38(H) 26(H)  Creatinine 0.61 - 1.24 mg/dL 0.99 0.98 0.93  Sodium 135 - 145 mmol/L 138 138 138  Potassium 3.5 - 5.1 mmol/L 4.8 4.4 4.5  Chloride 98 - 111 mmol/L 97(L) 102 103  CO2 22 - 32 mmol/L 29 27 28   Calcium 8.9 - 10.3 mg/dL 9.3 9.0 8.4(L)  Total Protein 6.5 - 8.1 g/dL 5.7(L) 5.5(L) 5.0(L)  Total Bilirubin 0.3 - 1.2 mg/dL 1.1 0.6 0.4  Alkaline Phos 38 - 126 U/L 46 43 41  AST 15 - 41 U/L 48(H) 41 38  ALT 0 - 44 U/L 86(H) 83(H) 73(H)    Lab Results  Component Value Date   WBC 10.1 05/12/2019   HGB 14.0 05/12/2019   HCT 41.3 05/12/2019   MCV 98.8 05/12/2019   PLT 112 (L) 05/12/2019   NEUTROABS 8.3 (H) 05/12/2019    Dg Chest 2 View  Result Date: 05/12/2019 CLINICAL DATA:  Weakness, lower extremity edema, history of metastatic cancer EXAM: CHEST - 2 VIEW COMPARISON:  03/25/2019 FINDINGS: The heart size and mediastinal contours are within normal limits. Unchanged elevation of the left hemidiaphragm without acute appearing airspace opacity. Disc degenerative disease  and ankylosis of the thoracic spine. IMPRESSION: Unchanged elevation of the left hemidiaphragm without acute appearing airspace opacity. Electronically Signed   By: Eddie Candle M.D.   On: 05/12/2019 16:59   Ct Head Wo Contrast  Result Date: 05/12/2019 CLINICAL DATA:  Hypoglycemia, pedal edema, history hypertension, melanoma EXAM: CT HEAD WITHOUT CONTRAST TECHNIQUE: Contiguous axial images were obtained from the base of the skull through the vertex without intravenous contrast. Sagittal and coronal MPR images reconstructed from axial data set. COMPARISON:  CT head 03/27/2019 FINDINGS: Brain: Generalized atrophy. Mild ex vacuo dilatation of the ventricular system. Again identified hyperdense focus at the posterior RIGHT parieto-occipital lobe 2.5 x 2.3 cm consistent with intraparenchymal hemorrhage, decreased in size. Persistent residual surrounding edema. Smaller LEFT except a hemorrhage seen on the previous exam has decreased in size as well, now 1.8 x 1.3 cm and demonstrating a fluid/fluid level. Decreased attenuation and size of previously seen LEFT temporal lobe hemorrhage, now 10 x 9 mm. Small vessel chronic ischemic changes of deep cerebral white matter. No new areas of intracranial hemorrhage, mass lesion or infarction. No extra-axial fluid collections. Vascular: Atherosclerotic calcifications of internal carotid arteries at skull base. Skull: Intact Sinuses/Orbits: Clear Other: N/A  IMPRESSION: Atrophy with small vessel chronic ischemic changes of deep cerebral white matter. Interval decrease in sizes of previously seen intraparenchymal hemorrhages in both hemispheres at sites of known hemorrhagic metastases. No new intracranial abnormalities. Electronically Signed   By: Lavonia Dana M.D.   On: 05/12/2019 17:13   Ct Angio Chest Pe W/cm &/or Wo Cm  Result Date: 05/12/2019 CLINICAL DATA:  Bilateral lower extremity swelling. EXAM: CT ANGIOGRAPHY CHEST WITH CONTRAST TECHNIQUE: Multidetector CT imaging of  the chest was performed using the standard protocol during bolus administration of intravenous contrast. Multiplanar CT image reconstructions and MIPs were obtained to evaluate the vascular anatomy. CONTRAST:  166mL OMNIPAQUE IOHEXOL 350 MG/ML SOLN COMPARISON:  None. FINDINGS: Cardiovascular: Small filling defects are noted in upper and lower lobe branches of the right pulmonary artery. RV/LV ratio of 1.0 is noted. Normal cardiac size. No pericardial effusion. Atherosclerosis of thoracic aorta is noted without aneurysm or dissection. Mediastinum/Nodes: Thyroid gland and esophagus are unremarkable. Extensive mediastinal adenopathy is noted. This includes 3 cm subcarinal adenopathy, 2.3 cm prevascular adenopathy, 2.3 cm left paratracheal adenopathy and 12 mm right hilar lymph node. 17 mm left supraclavicular lymph node is noted. Lungs/Pleura: No pneumothorax is noted. Left upper lobe atelectasis or infiltrate is noted. 36 x 28 mm probable necrotic mass is noted in the right posterior costophrenic sulcus. Upper Abdomen: 16 mm right adrenal nodule is noted. Musculoskeletal: No chest wall abnormality. No acute or significant osseous findings. Review of the MIP images confirms the above findings. IMPRESSION: Small filling defects are noted in upper and lower lobe branches of right pulmonary artery consistent with small pulmonary emboli. Positive for acute PE with CT evidence of right heart strain (RV/LV Ratio = 1.0) consistent with at least submassive (intermediate risk) PE. The presence of right heart strain has been associated with an increased risk of morbidity and mortality. Please activate Code PE by paging [phone]. 3.6 x 2.8 cm probable necrotic mass is noted in the right posterior costophrenic sulcus of the right lung concerning for malignancy. Right hilar, left supraclavicular, left paratracheal, subcarinal and prevascular adenopathy is noted concerning for metastatic disease. Critical Value/emergent results  were called by telephone at the time of interpretation on 05/12/2019 at 7:02 pm to providerRACHEL LITTLE , who verbally acknowledged these results. Mild left upper lobe atelectasis or infiltrate is noted. 16 mm right adrenal nodule is noted. MRI is recommended to rule out metastatic disease. Aortic Atherosclerosis (ICD10-I70.0). Electronically Signed   By: Marijo Conception M.D.   On: 05/12/2019 19:02   Mr Jeri Cos [id number] Contrast  Result Date: 04/17/2019 CLINICAL DATA:  Metastatic melanoma. Preoperative planning for stereotactic radio surgery. EXAM: MRI HEAD WITHOUT AND WITH CONTRAST TECHNIQUE: Multiplanar, multiecho pulse sequences of the brain and surrounding structures were obtained without and with intravenous contrast. CONTRAST:  87mL MULTIHANCE GADOBENATE DIMEGLUMINE 529 MG/ML IV SOLN COMPARISON:  MRI head 03/26/2019 FINDINGS: Brain: Multiple enhancing lesions the brain compatible with widespread metastatic disease. These lesions show susceptibility compatible with prior hemorrhage/melanin content. Lesions are marked on the axial postcontrast images in PACS. 2 mm area of restricted diffusion right cerebellum without enhancement. This was not seen previously and could represent a small acute infarct versus early metastatic disease. 10 mm right occipital lesion with surrounding edema Punctate 2 mm enhancing lesion right occipital lesion with surrounding edema unchanged 20 mm cystic mass left occipital lobe with surrounding edema Complex hemorrhagic lesion right occipital parietal lobe measuring 43 x 37 mm similar to prior. Surrounding edema  and local mass-effect. 11 mm left lateral temporal lesion 52mm enhancing lesion right frontal lobe Punctate 1.5 mm lesion right parietal cortex axial image 133 Punctate 1.5 x 2 mm enhancing lesion left frontal cortex axial image 140 Ventricle size normal.  Mild atrophy.  No midline shift. Vascular: Normal arterial flow voids. Skull and upper cervical spine: No skull lesion. Left  frontal scalp resection over the convexity. Sinuses/Orbits: Mild mucosal edema paranasal sinuses. Bilateral cataract surgery Other: None IMPRESSION: At least 9 metastatic deposits in the brain as described above. Most of these show evidence of prior hemorrhage or melanin deposition due to metastatic melanoma. Largest lesion right occipital parietal lobe with surrounding edema and local mass-effect. New punctate area of restricted diffusion right cerebellum without enhancement. This could represent acute infarct due to hypercoagulability or possibly early metastatic disease. Electronically Signed   By: Franchot Gallo M.D.   On: 04/17/2019 16:08   Vas [place] Lower Extremity Venous (dvt) (mc And Wl 7a-7p)  Result Date: 05/12/2019  Lower Venous Study Indications: Swelling.  Comparison Study: No prior study. Performing Technologist: Maudry Mayhew MHA, RDMS, RVT, RDCS  Examination Guidelines: A complete evaluation includes B-mode imaging, spectral Doppler, color Doppler, and power Doppler as needed of all accessible portions of each vessel. Bilateral testing is considered an integral part of a complete examination. Limited examinations for reoccurring indications may be performed as noted.  +---------+---------------+---------+-----------+----------+--------------+ RIGHT    CompressibilityPhasicitySpontaneityPropertiesThrombus Aging +---------+---------------+---------+-----------+----------+--------------+ CFV      Full           Yes      Yes                                 +---------+---------------+---------+-----------+----------+--------------+ SFJ      Full                                                        +---------+---------------+---------+-----------+----------+--------------+ FV Prox  Full                                                        +---------+---------------+---------+-----------+----------+--------------+ FV Mid   Full                                                         +---------+---------------+---------+-----------+----------+--------------+ FV DistalFull                                                        +---------+---------------+---------+-----------+----------+--------------+ PFV      Full                                                        +---------+---------------+---------+-----------+----------+--------------+  POP      Full           Yes      Yes                                 +---------+---------------+---------+-----------+----------+--------------+ PTV      Full                                                        +---------+---------------+---------+-----------+----------+--------------+ PERO     Full                                                        +---------+---------------+---------+-----------+----------+--------------+  +---------+---------------+---------+-----------+----------+-----------------+ LEFT     CompressibilityPhasicitySpontaneityPropertiesThrombus Aging    +---------+---------------+---------+-----------+----------+-----------------+ CFV      Full           Yes      Yes                                    +---------+---------------+---------+-----------+----------+-----------------+ SFJ      Full                                                           +---------+---------------+---------+-----------+----------+-----------------+ FV Prox  Full                                                           +---------+---------------+---------+-----------+----------+-----------------+ FV Mid   None                    No                   Acute             +---------+---------------+---------+-----------+----------+-----------------+ FV DistalNone                    No                   Acute             +---------+---------------+---------+-----------+----------+-----------------+ PFV      Full                                                            +---------+---------------+---------+-----------+----------+-----------------+ POP      Partial                 Yes  Age Indeterminate +---------+---------------+---------+-----------+----------+-----------------+ PTV      None                    No                   Acute             +---------+---------------+---------+-----------+----------+-----------------+ PERO     Full                                                           +---------+---------------+---------+-----------+----------+-----------------+  Summary: Right: There is no evidence of deep vein thrombosis in the lower extremity. No cystic structure found in the popliteal fossa. Left: Findings consistent with acute deep vein thrombosis involving the left femoral vein, and left posterior tibial veins. Findings consistent with age indeterminate deep vein thrombosis involving the left popliteal vein. No cystic structure found in the popliteal fossa.  *See table(s) above for measurements and observations.    Preliminary     ASSESSMENT AND PLAN: 1.  Metastatic melanoma 2.  Submassive PE, left lower extremity DVT 3.  Hemorrhagic brain metastases 4.  ?  Pneumonia 5.  Hyperglycemia secondary to dexamethasone 6.  Hypertension 7.  Anxiety  -The patient was scheduled to begin immunotherapy with ipilimumab and nivolumab today at the cancer center.  Appointment has been canceled secondary to the patient's admission.  We will plan to reschedule his treatment once he has been discharged from the hospital. -I discussed with the patient and his son, Bezaleel, by telephone that his underlying malignancy put him at higher risk for development of clots.  He now has a pulmonary embolism and acute DVT of the left lower extremity.  Unfortunately, due to his hemorrhagic brain metastases, he is not a candidate for anticoagulation.  Neurology was consulted last night and I have spoken with his neurosurgeon by  telephone who agree.  I have talked with the patient and his son regarding consideration of an IVC filter.  We discussed that the filter would not prevent formation of new clots.  Risks of IVC filters have been briefly discussed with the patient including risk of infection and dislodgment of the filter.  The patient would be agreeable to proceed. -I will recommend that he continue his current dose of dexamethasone 4 mg twice daily.  We will taper this down to 2 mg daily once he starts his immunotherapy. -Continue management of his other medical conditions per hospitalist. -The patient's son is currently out of state and will be flying to [place] this evening.  We will plan to follow-up with the patient and his son for goals of care discussion. I would also recommend Palliative Care consult to assist with [place] discussion given his underlying incurable malignancy.    LOS: 1 day   Mikey Bussing, DNP, AGPCNP-BC, AOCNP 05/13/19

## 2019-05-13 NOTE — Telephone Encounter (Signed)
I see he is now hospitalized. We can readdress after DC.

## 2019-05-13 NOTE — Progress Notes (Signed)
Inpatient Diabetes Program Recommendations  AACE/ADA: New Consensus Statement on Inpatient Glycemic Control (2015)  Target Ranges:  Prepandial:   less than 140 mg/dL      Peak postprandial:   less than 180 mg/dL (1-2 hours)      Critically ill patients:  140 - 180 mg/dL   Lab Results  Component Value Date   GLUCAP 351 (H) 05/13/2019   HGBA1C 8.8 (H) 05/13/2019    Review of Glycemic Control Results for Randall Mcgee, Randall Mcgee (MRN [id number]) as of 05/13/2019 15:26  Ref. Range 05/12/2019 18:59 05/12/2019 21:32 05/12/2019 23:15 05/13/2019 07:38 05/13/2019 11:38  Glucose-Capillary Latest Ref Range: 70 - 99 mg/dL 318 (H) 340 (H) 344 (H) 352 (H) 351 (H)   Diabetes history: No prior hx DM noted Current orders for Inpatient glycemic control: Novolog sensitive correction tid + hs 0-5 units  Inpatient Diabetes Program Recommendations:   Patient is running >300 CBGs consistently. Please consider: -Add Lantus 10 units daily (0.1 units/kg x 94.3 kg)  Thank you, Bethena Roys E. Aarya Quebedeaux, RN, MSN, CDE  Diabetes Coordinator Inpatient Glycemic Control Team Team Pager [phone] (8am-5pm) 05/13/2019 3:28 PM

## 2019-05-13 NOTE — Progress Notes (Addendum)
PROGRESS NOTE    Randall Mcgee  [id number]  DOB: 10/18/1940  DOA: 05/12/2019 PCP: Isaac Bliss, Rayford Halsted, MD  Brief Narrative:  78 y.o. male with medical history significant for metastatic melanoma with hemorrhagic brain metastases, and hypertension, now presenting to emergency department with leg swelling and hyperglycemia on outpatient blood work.  Patient had melanoma resected in 2017, was found to have hemorrhagic brain metastases last month, was treated with radiation and immunotherapy, has been taking Decadron, and was directed to the ED for evaluation of hyperglycemia on outpatient blood work.  Patient complained of swelling to the bilateral feet.  Patient reports recent bilateral leg swelling.  He was seen in the emergency department a couple days ago, was given Lasix with some improvement in leg swellings. ED Course:  afebrile, saturating upper 80s to low 90s on room air,  EKG with LBBB.  CMP with mild transaminitis and elevated BUN to creatinine ratio.  CBC- thrombocytopenia with platelets 112k.  Lactic acid is elevated to 2.9.  CTA chest is concerning for pulmonary emboli involving right pulmonary artery with possible right heart strain, probable necrotic mass on the right, possible pneumonia in the left upper lobe, and right adrenal nodule.  Patient was given a liter of normal saline in the ED. Admitted to [facility] service with Novolog insulin, empiric abx for PNA but not initiated on anticoagulation per d/w neurology Dr Rory Percy in view of bleeding risk with recent hemorrhagic brain mets.Neurology recommended palliative care consult and goals of care discussion.   Subjective:  Patient awake alert oriented x3.  He states his leg swellings are much improved and he feels he is breathing better today.  Saturating well on room air.   Objective: Vitals:   05/12/19 2200 05/12/19 2313 05/13/19 0552 05/13/19 0735  BP: 114/76 134/74 129/80 130/82  Pulse: 74 67 70 66  Resp: 17 18 18      Temp:  97.7 F (36.5 C) 97.8 F (36.6 C) 97.7 F (36.5 C)  TempSrc:  Oral  Oral  SpO2: 95% 99% 92% 92%  Weight:      Height:        Intake/Output Summary (Last 24 hours) at 05/13/2019 0813 Last data filed at 05/13/2019 0743 Gross per 24 hour  Intake 1152.1 ml  Output 500 ml  Net 652.1 ml   Filed Weights   05/12/19 1511  Weight: 94.3 kg    Physical Examination:  General exam: Appears calm and comfortable  Respiratory system: Clear to auscultation.  Decreased breath sounds at bases. Respiratory effort normal. Cardiovascular system: S1 & S2 heard, RRR. No JVD, murmurs.  Trace to 1+ pedal edema. Gastrointestinal system: Abdomen is nondistended, soft and nontender. No organomegaly or masses felt. Normal bowel sounds heard. Central nervous system: Alert and oriented. No focal neurological deficits. Extremities: Symmetric 4 x 5 power. Skin: No rashes, lesions or ulcers Psychiatry: Judgement and insight appear normal. Mood & affect anxious.     Data Reviewed: I have personally reviewed following labs and imaging studies  CBC: Recent Labs  Lab 05/10/19 2013 05/12/19 1537  WBC 9.4 10.1  NEUTROABS 7.5 8.3*  HGB 13.3 14.0  HCT 39.9 41.3  MCV 100.8* 98.8  PLT 129* [id number]*   Basic Metabolic Panel: Recent Labs  Lab 05/10/19 2013 05/12/19 1537  NA 138 138  K 4.4 4.8  CL 102 97*  CO2 27 29  GLUCOSE 463* 434*  BUN 38* 39*  CREATININE 0.98 0.99  CALCIUM 9.0 9.3   GFR: Estimated  Creatinine Clearance: 68.6 mL/min (by C-G formula based on SCr of 0.99 mg/dL). Liver Function Tests: Recent Labs  Lab 05/10/19 2013 05/12/19 1537  AST 41 48*  ALT 83* 86*  ALKPHOS 43 46  BILITOT 0.6 1.1  PROT 5.5* 5.7*  ALBUMIN 3.0* 2.9*   No results for input(s): LIPASE, AMYLASE in the last 168 hours. No results for input(s): AMMONIA in the last 168 hours. Coagulation Profile: No results for input(s): INR, PROTIME in the last 168 hours. Cardiac Enzymes: No results for input(s):  CKTOTAL, CKMB, CKMBINDEX, TROPONINI in the last 168 hours. BNP (last 3 results) No results for input(s): PROBNP in the last 8760 hours. HbA1C: No results for input(s): HGBA1C in the last 72 hours. CBG: Recent Labs  Lab 05/12/19 1510 05/12/19 1859 05/12/19 2132 05/12/19 2315 05/13/19 0738  GLUCAP 428* 318* 340* 344* 352*   Lipid Profile: No results for input(s): CHOL, HDL, LDLCALC, TRIG, CHOLHDL, LDLDIRECT in the last 72 hours. Thyroid Function Tests: No results for input(s): TSH, T4TOTAL, FREET4, T3FREE, THYROIDAB in the last 72 hours. Anemia Panel: No results for input(s): VITAMINB12, FOLATE, FERRITIN, TIBC, IRON, RETICCTPCT in the last 72 hours. Sepsis Labs: Recent Labs  Lab 05/12/19 1537 05/12/19 1915  LATICACIDVEN 2.9* 2.2*    Recent Results (from the past 240 hour(s))  SARS CORONAVIRUS 2 (TAT 6-24 HRS) Nasopharyngeal Nasopharyngeal Swab     Status: None   Collection Time: 05/12/19  6:43 PM   Specimen: Nasopharyngeal Swab  Result Value Ref Range Status   SARS Coronavirus 2 NEGATIVE NEGATIVE Final    Comment: (NOTE) SARS-CoV-2 target nucleic acids are NOT DETECTED. The SARS-CoV-2 RNA is generally detectable in upper and lower respiratory specimens during the acute phase of infection. Negative results do not preclude SARS-CoV-2 infection, do not rule out co-infections with other pathogens, and should not be used as the sole basis for treatment or other patient management decisions. Negative results must be combined with clinical observations, patient history, and epidemiological information. The expected result is Negative. Fact Sheet for Patients: SugarRoll.be Fact Sheet for Healthcare Providers: https://www.woods-mathews.com/ This test is not yet approved or cleared by the [place] FDA and  has been authorized for detection and/or diagnosis of SARS-CoV-2 by FDA under an Emergency Use Authorization (EUA). This EUA will  remain  in effect (meaning this test can be used) for the duration of the COVID-19 declaration under Section 56 4(b)(1) of the Act, 21 U.S.C. section 360bbb-3(b)(1), unless the authorization is terminated or revoked sooner. Performed at [facility], [place] [address] [place], [place] [address]       Radiology Studies: Dg Chest 2 View  Result Date: 05/12/2019 CLINICAL DATA:  Weakness, lower extremity edema, history of metastatic cancer EXAM: CHEST - 2 VIEW COMPARISON:  03/25/2019 FINDINGS: The heart size and mediastinal contours are within normal limits. Unchanged elevation of the left hemidiaphragm without acute appearing airspace opacity. Disc degenerative disease and ankylosis of the thoracic spine. IMPRESSION: Unchanged elevation of the left hemidiaphragm without acute appearing airspace opacity. Electronically Signed   By: Eddie Candle M.D.   On: 05/12/2019 16:59   Ct Head Wo Contrast  Result Date: 05/12/2019 CLINICAL DATA:  Hypoglycemia, pedal edema, history hypertension, melanoma EXAM: CT HEAD WITHOUT CONTRAST TECHNIQUE: Contiguous axial images were obtained from the base of the skull through the vertex without intravenous contrast. Sagittal and coronal MPR images reconstructed from axial data set. COMPARISON:  CT head 03/27/2019 FINDINGS: Brain: Generalized atrophy. Mild ex vacuo dilatation of  the ventricular system. Again identified hyperdense focus at the posterior RIGHT parieto-occipital lobe 2.5 x 2.3 cm consistent with intraparenchymal hemorrhage, decreased in size. Persistent residual surrounding edema. Smaller LEFT except a hemorrhage seen on the previous exam has decreased in size as well, now 1.8 x 1.3 cm and demonstrating a fluid/fluid level. Decreased attenuation and size of previously seen LEFT temporal lobe hemorrhage, now 10 x 9 mm. Small vessel chronic ischemic changes of deep cerebral white matter. No new areas of intracranial hemorrhage, mass lesion or infarction. No  extra-axial fluid collections. Vascular: Atherosclerotic calcifications of internal carotid arteries at skull base. Skull: Intact Sinuses/Orbits: Clear Other: N/A IMPRESSION: Atrophy with small vessel chronic ischemic changes of deep cerebral white matter. Interval decrease in sizes of previously seen intraparenchymal hemorrhages in both hemispheres at sites of known hemorrhagic metastases. No new intracranial abnormalities. Electronically Signed   By: Lavonia Dana M.D.   On: 05/12/2019 17:13   Ct Angio Chest Pe W/cm &/or Wo Cm  Result Date: 05/12/2019 CLINICAL DATA:  Bilateral lower extremity swelling. EXAM: CT ANGIOGRAPHY CHEST WITH CONTRAST TECHNIQUE: Multidetector CT imaging of the chest was performed using the standard protocol during bolus administration of intravenous contrast. Multiplanar CT image reconstructions and MIPs were obtained to evaluate the vascular anatomy. CONTRAST:  131mL OMNIPAQUE IOHEXOL 350 MG/ML SOLN COMPARISON:  None. FINDINGS: Cardiovascular: Small filling defects are noted in upper and lower lobe branches of the right pulmonary artery. RV/LV ratio of 1.0 is noted. Normal cardiac size. No pericardial effusion. Atherosclerosis of thoracic aorta is noted without aneurysm or dissection. Mediastinum/Nodes: Thyroid gland and esophagus are unremarkable. Extensive mediastinal adenopathy is noted. This includes 3 cm subcarinal adenopathy, 2.3 cm prevascular adenopathy, 2.3 cm left paratracheal adenopathy and 12 mm right hilar lymph node. 17 mm left supraclavicular lymph node is noted. Lungs/Pleura: No pneumothorax is noted. Left upper lobe atelectasis or infiltrate is noted. 36 x 28 mm probable necrotic mass is noted in the right posterior costophrenic sulcus. Upper Abdomen: 16 mm right adrenal nodule is noted. Musculoskeletal: No chest wall abnormality. No acute or significant osseous findings. Review of the MIP images confirms the above findings. IMPRESSION: Small filling defects are noted  in upper and lower lobe branches of right pulmonary artery consistent with small pulmonary emboli. Positive for acute PE with CT evidence of right heart strain (RV/LV Ratio = 1.0) consistent with at least submassive (intermediate risk) PE. The presence of right heart strain has been associated with an increased risk of morbidity and mortality. Please activate Code PE by paging [phone]. 3.6 x 2.8 cm probable necrotic mass is noted in the right posterior costophrenic sulcus of the right lung concerning for malignancy. Right hilar, left supraclavicular, left paratracheal, subcarinal and prevascular adenopathy is noted concerning for metastatic disease. Critical Value/emergent results were called by telephone at the time of interpretation on 05/12/2019 at 7:02 pm to providerRACHEL LITTLE , who verbally acknowledged these results. Mild left upper lobe atelectasis or infiltrate is noted. 16 mm right adrenal nodule is noted. MRI is recommended to rule out metastatic disease. Aortic Atherosclerosis (ICD10-I70.0). Electronically Signed   By: Marijo Conception M.D.   On: 05/12/2019 19:02   Vas [place] Lower Extremity Venous (dvt) (mc And Wl 7a-7p)  Result Date: 05/12/2019  Lower Venous Study Indications: Swelling.  Comparison Study: No prior study. Performing Technologist: Maudry Mayhew MHA, RDMS, RVT, RDCS  Examination Guidelines: A complete evaluation includes B-mode imaging, spectral Doppler, color Doppler, and power Doppler as needed of all  accessible portions of each vessel. Bilateral testing is considered an integral part of a complete examination. Limited examinations for reoccurring indications may be performed as noted.  +---------+---------------+---------+-----------+----------+--------------+  RIGHT     Compressibility Phasicity Spontaneity Properties Thrombus Aging  +---------+---------------+---------+-----------+----------+--------------+  CFV       Full            Yes       Yes                                     +---------+---------------+---------+-----------+----------+--------------+  SFJ       Full                                                             +---------+---------------+---------+-----------+----------+--------------+  FV Prox   Full                                                             +---------+---------------+---------+-----------+----------+--------------+  FV Mid    Full                                                             +---------+---------------+---------+-----------+----------+--------------+  FV Distal Full                                                             +---------+---------------+---------+-----------+----------+--------------+  PFV       Full                                                             +---------+---------------+---------+-----------+----------+--------------+  POP       Full            Yes       Yes                                    +---------+---------------+---------+-----------+----------+--------------+  PTV       Full                                                             +---------+---------------+---------+-----------+----------+--------------+  PERO      Full                                                             +---------+---------------+---------+-----------+----------+--------------+  +---------+---------------+---------+-----------+----------+-----------------+  LEFT      Compressibility Phasicity Spontaneity Properties Thrombus Aging     +---------+---------------+---------+-----------+----------+-----------------+  CFV       Full            Yes       Yes                                       +---------+---------------+---------+-----------+----------+-----------------+  SFJ       Full                                                                +---------+---------------+---------+-----------+----------+-----------------+  FV Prox   Full                                                                 +---------+---------------+---------+-----------+----------+-----------------+  FV Mid    None                      No                     Acute              +---------+---------------+---------+-----------+----------+-----------------+  FV Distal None                      No                     Acute              +---------+---------------+---------+-----------+----------+-----------------+  PFV       Full                                                                +---------+---------------+---------+-----------+----------+-----------------+  POP       Partial                   Yes                    Age Indeterminate  +---------+---------------+---------+-----------+----------+-----------------+  PTV       None                      No                     Acute              +---------+---------------+---------+-----------+----------+-----------------+  PERO      Full                                                                +---------+---------------+---------+-----------+----------+-----------------+  Summary: Right: There is no evidence of deep vein thrombosis in the lower extremity. No cystic structure found in the popliteal fossa. Left: Findings consistent with acute deep vein thrombosis involving the left femoral vein, and left posterior tibial veins. Findings consistent with age indeterminate deep vein thrombosis involving the left popliteal vein. No cystic structure found in the popliteal fossa.  *See table(s) above for measurements and observations.    Preliminary         Scheduled Meds:  dexamethasone  4 mg Oral BID   insulin aspart  0-5 Units Subcutaneous QHS   insulin aspart  0-9 Units Subcutaneous TID WC   propranolol  40 mg Oral BID   Continuous Infusions:  sodium chloride Stopped (05/12/19 2019)   azithromycin 500 mg (05/12/19 2110)   cefTRIAXone (ROCEPHIN)  IV Stopped (05/12/19 2109)    Assessment & Plan:    1.  Left LE DVT/acute pulmonary embolism: Patient not a  candidate for anticoagulation given hemorrhagic brain mets as per recommendations by neurology as well as hematology/oncology who evaluated patient today and discussed with his primary radiation oncologist as well as neurosurgeon Dr.Nudelman.  Option of IVC filter was discussed both by me and by hematology with patient as well as his son.  Patient and family understand that IVC filter in itself can be hypercoagulable and patient can form pulmonary clots in the setting of malignancy if not embolized from DVT.  They however would like to proceed with this intervention to prevent pulmonary embolization of residual left lower extremity DVT.  IR consult request placed.  Fortunately patient is saturating well on room air currently.  No evidence of RV strain on echo although reported on CT.  2.  Steroid-induced hyperglycemia: Patient denies history of diabetes.  His hemoglobin A1c was 6.1% in June 2020.  Blood glucose still up around 350 today.  Will add long-acting insulin but patient may do better as an outpatient with an oral hypoglycemic. Taper steroids as deemed appropriate by oncology.   3.  Metastatic melanoma with hemorrhagic brain mets: He is currently under the care of Dr. Julien Nordmann for metastatic melanoma.  He recently completed SBRT to the multiple brain metastasis under the care of Dr. Tyler Pita.  He is currently under consideration to begin immunotherapy with ipilimumab and nivolumab followed by maintenance nivolumab.  He was scheduled to start this today, 05/13/2019.  He was also supposed to have a PET scan performed on 05/15/2019, but this has been canceled since the patient has been admitted.  Patient did have CT head on admission showed improvement in previously seen intraparenchymal cerebral hemorrhages, no new abnormalities noted.  4. Right lung mass/?  Left upper lobe pneumonia/right adrenal nodule: CT chest done on admission revealed 3.6 x 2.8 cm probable necrotic mass noted in the right  posterior costophrenic sulcus of the right lung concerning for malignancy, right hilar left supraclavicular, left paratracheal, subcarinal and prevascular adenopathy noted concerning for metastatic disease.  It also reported possible left upper lobe pneumonia.  Patient initiated on empiric antibiotics-Rocephin/azithromycin (QTC 449).  No evidence of fever or leukocytosis.  Defer malignancy further work-up and management options to oncology.  5.  Bilateral lower extremity edema: Echo done today shows normal EF and normal right ventricular morphology.  Edema now improved and could have been related to postphlebitic swelling.  Can utilize Lasix as needed.  6.  Hypertension: Resumed on home medications  7.  Anxiety disorder: Xanax as needed    DVT prophylaxis: DC SCDs given  active DVT.  Not a candidate for anticoagulation.  Will order TED hose Code Status: Full code Family / Patient Communication: Discussed with patient and oncology APP.  Son on his way to [place] from [place] (took a flight this afternoon).  Oncology APP was able to communicate with son before he took the flight.  Palliative care team consulted for further discussions regarding care goals as suggested by oncology. Disposition Plan: TBD     LOS: 1 day    Time spent:     Guilford Shi, MD [facility] Pager [phone]  If 7PM-7AM, please contact night-coverage [email or web address] Password [email or web address] 05/13/2019, 8:13 AM

## 2019-05-13 NOTE — Progress Notes (Signed)
NURSING PROGRESS NOTE  CHRISTIN JONES IV:[id number] Transfer Data: 05/12/2019 11:30 PM Attending Provider: Vianne Bulls, MD [id number] [name], MD Code Status: Full   Randall Mcgee is a 78 y.o. male patient transferred from [facility] -No acute distress noted.  -No complaints of shortness of breath.  -No complaints of chest pain.   Cardiac Monitoring: Box # 17 in place. Cardiac monitor yields:normal sinus rhythm.  Blood pressure 134/74, pulse 67, temperature 97.7 F (36.5 C), temperature source Oral, resp. rate 18, height 5\' 10"  (1.778 m), weight 94.3 kg, SpO2 99 %.   IV Fluids:  IV in place, occlusive dsg intact without redness, IV cath antecubital left, condition patent and no redness normal saline.   Allergies:  Patient has no known allergies.  Past Medical History:   has a past medical history of Anxiety, Cancer ([place]) (2017), History of chicken pox, Hyperlipidemia, Hypertension, and Right inguinal hernia.  Past Surgical History:   has a past surgical history that includes Cholecystectomy; Tonsillectomy; Tonsillectomy; Endarterectomy (06/20/11); Endarterectomy (06/20/2011); Mass excision (N/A, 06/28/2016); Application of a-cell of head/neck (N/A, 06/28/2016); Mass excision (N/A, 07/13/2016); Application of a-cell of extremity (N/A, 07/13/2016); Mass excision (N/A, 07/26/2016); Application of a-cell of head/neck (N/A, 07/26/2016); Skin split graft (Left, 08/31/2016); Application of a-cell of head/neck (N/A, 08/31/2016); Inguinal hernia repair (Right, 08/16/2018); and Cataract extraction, bilateral.  Social History:   reports that he has never smoked. He has never used smokeless tobacco. He reports that he does not drink alcohol or use drugs.  Skin: Intact  Patient/Family orientated to room. Information packet given to patient/family. Admission inpatient armband information verified with patient/family to include name and date of birth and placed on patient arm. Side rails up x  2, fall assessment and education completed with patient/family. Patient/family able to verbalize understanding of risk associated with falls and verbalized understanding to call for assistance before getting out of bed. Call light within reach. Patient/family able to voice and demonstrate understanding of unit orientation instructions.    Will continue to evaluate and treat per MD orders.

## 2019-05-13 NOTE — Progress Notes (Signed)
  Echocardiogram 2D Echocardiogram has been performed.  Burnett Kanaris 05/13/2019, 11:11 AM

## 2019-05-13 NOTE — Consult Note (Signed)
Chief Complaint: Patient was seen in consultation today for IVC filter placement.  Referring Physician(s): Dr. Earnest Conroy  Supervising Physician: Aletta Edouard  Patient Status: [facility] - In-pt  History of Present Illness: Randall Mcgee is a 78 y.o. male with a past medical history significant for anxiety, HTN, HLD and metastatic melanoma s/p radiation with known hemorrhagic brain metastases who presented [facility] ED yesterday with complaints of lower extremity edema. Work up notable for acute LLE DVT in the femoral and posterior tibial veins, indeterminate DVT in the left popliteal veins, small right PE and known intraparenchymal hemorrhages in both hemispheres at sites which were previously seen and had decreased from prior exam. Neurology was consulted to discuss possible anticoagulation due to acute DVT/PE however this was felt to be high risk for worsening of the intraparenchymal bleeds, possibly leading to fatal bleeding if they known hemorrhages were to increase in size. IR has been consulted for IVC filter placement as patient is not a candidate for anticoagulation and is at high risk for further clot formation due to metastatic disease.  [name] is laying in bed and appears comfortable on my exam, nursing student at bedside - he states he "mad, frustrated, upset, you name it." He states he is upset because he has not been able to receive pain medication for several hours and that he "just laid here until this nurse walked in."  He also reports that he "had to eat my dinner last night with my fingers" - when asked if he did not receive utensils he stated, "they give you a fork only." He states he is aware of the IVC filter request and states "I don't want to do it but I don't have a choice so I'm going to do it." He is somewhat confused on my exam and repeats the same previously answered questions several times during my exam, he is oriented to himself, "hospital" and month. I spoke with  patient's son Randall Mcgee via phone today regarding IVC filter placement and he agrees that they wish to proceed with placement.   Past Medical History:  Diagnosis Date   Anxiety    Cancer ([place]) 2017   melanoma on scalp   History of chicken pox    Hyperlipidemia    Hypertension    Right inguinal hernia     Past Surgical History:  Procedure Laterality Date   APPLICATION OF A-CELL OF EXTREMITY N/A 07/13/2016   Procedure: APPLICATION OF A-CELL;  Surgeon: Wallace Going, DO;  Location: [place];  Service: Plastics;  Laterality: N/A;   APPLICATION OF A-CELL OF HEAD/NECK N/A 06/28/2016   Procedure: APPLICATION OF A-CELL OF HEAD/NECK;  Surgeon: Wallace Going, DO;  Location: [place];  Service: Plastics;  Laterality: N/A;   APPLICATION OF A-CELL OF HEAD/NECK N/A 07/26/2016   Procedure: APPLICATION OF ACELL OF HEAD;  Surgeon: Wallace Going, DO;  Location: [place];  Service: Plastics;  Laterality: N/A;   APPLICATION OF A-CELL OF HEAD/NECK N/A 08/31/2016   Procedure: APPLICATION OF A-CELL OF HEAD/NECK;  Surgeon: Wallace Going, DO;  Location: [place];  Service: Plastics;  Laterality: N/A;   CATARACT EXTRACTION, BILATERAL     CHOLECYSTECTOMY     ENDARTERECTOMY  06/20/11   ENDARTERECTOMY  06/20/2011   Procedure: ENDARTERECTOMY CAROTID;  Surgeon: Elam Dutch, MD;  Location[facility];  Service: Vascular;  Laterality: Right;  Right Carotid endarterectomy with dacron patch angioplasty  INGUINAL HERNIA REPAIR Right 08/16/2018   Procedure: RIGHT INGUINAL HERNIA REPAIR WITH MESH;  Surgeon: Georganna Skeans, MD;  Location: [place];  Service: General;  Laterality: Right;   MASS EXCISION N/A 06/28/2016   Procedure: EXCISION SCALP MELANOMA;  Surgeon: Wallace Going, DO;  Location: [place];  Service: Plastics;  Laterality: N/A;   MASS EXCISION N/A 07/13/2016    Procedure: RE- EXCISION OF SCALP MELANOMA;  Surgeon: Wallace Going, DO;  Location: [place];  Service: Plastics;  Laterality: N/A;   MASS EXCISION N/A 07/26/2016   Procedure: RE-EXCISION OF SCALP MELANOMA FOR POSITIVE MARGAIN;  Surgeon: Wallace Going, DO;  Location: [place];  Service: Plastics;  Laterality: N/A;   SKIN SPLIT GRAFT Left 08/31/2016   Procedure: SKIN GRAFT SPLIT THICKNESS TO HIS SCALP FROM HIS THIGH;  Surgeon: Wallace Going, DO;  Location: [place];  Service: Plastics;  Laterality: Left;   TONSILLECTOMY     TONSILLECTOMY      Allergies: Patient has no known allergies.  Medications: Prior to Admission medications   Medication Sig Start Date End Date Taking? Authorizing Provider  acetaminophen (TYLENOL) 325 MG tablet Take 2 tablets (650 mg total) by mouth every 6 (six) hours as needed for mild pain or headache. 04/02/19  Yes Barb Merino, MD  ALPRAZolam Duanne Moron) 0.5 MG tablet Take 0.5 mg by mouth 3 (three) times daily as needed for anxiety.   Yes [provider]  amLODipine (NORVASC) 5 MG tablet Take 1 tablet (5 mg total) by mouth daily. 04/05/19 05/12/19 Yes Dahal, Marlowe Aschoff, MD  cetirizine (ZYRTEC) 10 MG tablet Take 1 tablet (10 mg total) by mouth daily. 06/19/18  Yes Isaac Bliss, Rayford Halsted, MD  dexamethasone (DECADRON) 4 MG tablet Take 1 tablet (4 mg total) by mouth 2 (two) times daily. For one month, then we will taper to 2 mg BID 05/02/19 06/01/19 Yes Tyler Pita, MD  fluconazole (DIFLUCAN) 100 MG tablet Take 1 tablet (100 mg total) by mouth daily. 05/05/19  Yes Heilingoetter, Cassandra L, PA-C  furosemide (LASIX) 20 MG tablet Take 1 tablet (20 mg total) by mouth daily for 5 days. 05/11/19 05/16/19 Yes Julianne [place], MD  lisinopril (PRINIVIL,ZESTRIL) 40 MG tablet Take 1 tablet (40 mg total) by mouth daily. 09/19/18  Yes Isaac Bliss, Rayford Halsted, MD  Multiple Vitamins-Minerals (CENTRUM SILVER  PO) Take 1 tablet by mouth daily.    Yes [provider]  potassium chloride (KLOR-CON) 10 MEQ tablet Take 1 tablet (10 mEq total) by mouth daily. 05/11/19  Yes Julianne [place], MD  propranolol (INDERAL) 20 MG tablet Take 40 mg by mouth 2 (two) times daily.   Yes [provider]  triamcinolone (KENALOG) 0.1 % paste Use as directed 1 application in the mouth or throat every 3 (three) hours as needed (mouth sores).  05/07/19  Yes [provider]  vitamin B-12 (CYANOCOBALAMIN) 1000 MCG tablet Take 1 tablet (1,000 mcg total) by mouth daily. 04/02/19  Yes Barb Merino, MD  magic mouthwash w/lidocaine SOLN Take 5 mLs by mouth 4 (four) times daily as needed for mouth pain. Patient not taking: Reported on 05/05/2019 04/24/19   Isaac Bliss, Rayford Halsted, MD  magic mouthwash w/lidocaine SOLN Swish and swallow 5 ml by mouth 4 times daily. Lidocaine, Maalox, Benadryl Patient not taking: Reported on 05/05/2019 05/02/19   Tyler Pita, MD  magic mouthwash w/lidocaine SOLN Take 5 mLs by mouth 4 (four) times daily as  needed for mouth pain (swish, gargle and spit out or swallow, prior to meals and at bedtime). Patient not taking: Reported on 05/10/2019 05/08/19   Isaac Bliss, Rayford Halsted, MD  nystatin (MYCOSTATIN/NYSTOP) powder Apply topically 2 (two) times daily. To groin Patient not taking: Reported on 05/05/2019 04/24/19   Isaac Bliss, Rayford Halsted, MD     Family History  Problem Relation Age of Onset   Heart disease Mother     Social History   Socioeconomic History   Marital status: Married    Spouse name: Not on file   Number of children: Not on file   Years of education: Not on file   Highest education level: Not on file  Occupational History   Not on file  Social Needs   Financial resource strain: Not on file   Food insecurity    Worry: Not on file    Inability: Not on file   Transportation needs    Medical: Not on file    Non-medical: Not on file    Tobacco Use   Smoking status: Never Smoker   Smokeless tobacco: Never Used  Substance and Sexual Activity   Alcohol use: No   Drug use: No    Types: Other-see comments   Sexual activity: Yes  Lifestyle   Physical activity    Days per week: Not on file    Minutes per session: Not on file   Stress: Not on file  Relationships   Social connections    Talks on phone: Not on file    Gets together: Not on file    Attends religious service: Not on file    Active member of club or organization: Not on file    Attends meetings of clubs or organizations: Not on file    Relationship status: Not on file  Other Topics Concern   Not on file  Social History Narrative   Not on file     Review of Systems: A 12 point ROS discussed and pertinent positives are indicated in the HPI above.  All other systems are negative.  Review of Systems  Constitutional: Positive for fatigue.       Patient is somewhat of a poor historian today due to some confusion as well as agitation - he declines to answer multiple ROS questions.  Respiratory: Negative for cough and shortness of breath.   Cardiovascular: Negative for chest pain.  Gastrointestinal: Negative for abdominal pain.  Neurological: Positive for headaches.    Vital Signs: BP 115/88 (BP Location: Right Arm)    Pulse 72    Temp 97.8 F (36.6 C) (Oral)    Resp 17    Ht 5\' 10"  (1.778 m)    Wt 208 lb (94.3 kg)    SpO2 93%    BMI 29.84 kg/m   Physical Exam Vitals signs and nursing note reviewed.  Constitutional:      General: He is not in acute distress. HENT:     Head: Normocephalic.     Mouth/Throat:     Mouth: Mucous membranes are moist.     Pharynx: Oropharynx is clear. No oropharyngeal exudate or posterior oropharyngeal erythema.     Comments: Top and bottom dentures Cardiovascular:     Rate and Rhythm: Normal rate and regular rhythm.  Pulmonary:     Effort: Pulmonary effort is normal.     Breath sounds: Normal breath  sounds.  Abdominal:     General: There is no distension.     Palpations:  Abdomen is soft.     Tenderness: There is no abdominal tenderness.  Skin:    General: Skin is warm and dry.  Neurological:     Mental Status: He is alert.     Comments: Oriented to self, hospital and month  Psychiatric:        Mood and Affect: Mood normal.        Behavior: Behavior normal.      MD Evaluation Airway: WNL Heart: WNL Abdomen: WNL Chest/ Lungs: WNL ASA  Classification: 3 Mallampati/Airway Score: Two   Imaging: Dg Chest 2 View  Result Date: 05/12/2019 CLINICAL DATA:  Weakness, lower extremity edema, history of metastatic cancer EXAM: CHEST - 2 VIEW COMPARISON:  03/25/2019 FINDINGS: The heart size and mediastinal contours are within normal limits. Unchanged elevation of the left hemidiaphragm without acute appearing airspace opacity. Disc degenerative disease and ankylosis of the thoracic spine. IMPRESSION: Unchanged elevation of the left hemidiaphragm without acute appearing airspace opacity. Electronically Signed   By: Eddie Candle M.D.   On: 05/12/2019 16:59   Ct Head Wo Contrast  Result Date: 05/12/2019 CLINICAL DATA:  Hypoglycemia, pedal edema, history hypertension, melanoma EXAM: CT HEAD WITHOUT CONTRAST TECHNIQUE: Contiguous axial images were obtained from the base of the skull through the vertex without intravenous contrast. Sagittal and coronal MPR images reconstructed from axial data set. COMPARISON:  CT head 03/27/2019 FINDINGS: Brain: Generalized atrophy. Mild ex vacuo dilatation of the ventricular system. Again identified hyperdense focus at the posterior RIGHT parieto-occipital lobe 2.5 x 2.3 cm consistent with intraparenchymal hemorrhage, decreased in size. Persistent residual surrounding edema. Smaller LEFT except a hemorrhage seen on the previous exam has decreased in size as well, now 1.8 x 1.3 cm and demonstrating a fluid/fluid level. Decreased attenuation and size of previously  seen LEFT temporal lobe hemorrhage, now 10 x 9 mm. Small vessel chronic ischemic changes of deep cerebral white matter. No new areas of intracranial hemorrhage, mass lesion or infarction. No extra-axial fluid collections. Vascular: Atherosclerotic calcifications of internal carotid arteries at skull base. Skull: Intact Sinuses/Orbits: Clear Other: N/A IMPRESSION: Atrophy with small vessel chronic ischemic changes of deep cerebral white matter. Interval decrease in sizes of previously seen intraparenchymal hemorrhages in both hemispheres at sites of known hemorrhagic metastases. No new intracranial abnormalities. Electronically Signed   By: Lavonia Dana M.D.   On: 05/12/2019 17:13   Ct Angio Chest Pe W/cm &/or Wo Cm  Result Date: 05/12/2019 CLINICAL DATA:  Bilateral lower extremity swelling. EXAM: CT ANGIOGRAPHY CHEST WITH CONTRAST TECHNIQUE: Multidetector CT imaging of the chest was performed using the standard protocol during bolus administration of intravenous contrast. Multiplanar CT image reconstructions and MIPs were obtained to evaluate the vascular anatomy. CONTRAST:  155mL OMNIPAQUE IOHEXOL 350 MG/ML SOLN COMPARISON:  None. FINDINGS: Cardiovascular: Small filling defects are noted in upper and lower lobe branches of the right pulmonary artery. RV/LV ratio of 1.0 is noted. Normal cardiac size. No pericardial effusion. Atherosclerosis of thoracic aorta is noted without aneurysm or dissection. Mediastinum/Nodes: Thyroid gland and esophagus are unremarkable. Extensive mediastinal adenopathy is noted. This includes 3 cm subcarinal adenopathy, 2.3 cm prevascular adenopathy, 2.3 cm left paratracheal adenopathy and 12 mm right hilar lymph node. 17 mm left supraclavicular lymph node is noted. Lungs/Pleura: No pneumothorax is noted. Left upper lobe atelectasis or infiltrate is noted. 36 x 28 mm probable necrotic mass is noted in the right posterior costophrenic sulcus. Upper Abdomen: 16 mm right adrenal nodule is  noted. Musculoskeletal: No chest wall  abnormality. No acute or significant osseous findings. Review of the MIP images confirms the above findings. IMPRESSION: Small filling defects are noted in upper and lower lobe branches of right pulmonary artery consistent with small pulmonary emboli. Positive for acute PE with CT evidence of right heart strain (RV/LV Ratio = 1.0) consistent with at least submassive (intermediate risk) PE. The presence of right heart strain has been associated with an increased risk of morbidity and mortality. Please activate Code PE by paging [phone]. 3.6 x 2.8 cm probable necrotic mass is noted in the right posterior costophrenic sulcus of the right lung concerning for malignancy. Right hilar, left supraclavicular, left paratracheal, subcarinal and prevascular adenopathy is noted concerning for metastatic disease. Critical Value/emergent results were called by telephone at the time of interpretation on 05/12/2019 at 7:02 pm to providerRACHEL LITTLE , who verbally acknowledged these results. Mild left upper lobe atelectasis or infiltrate is noted. 16 mm right adrenal nodule is noted. MRI is recommended to rule out metastatic disease. Aortic Atherosclerosis (ICD10-I70.0). Electronically Signed   By: Marijo Conception M.D.   On: 05/12/2019 19:02   Mr Jeri Cos [id number] Contrast  Result Date: 04/17/2019 CLINICAL DATA:  Metastatic melanoma. Preoperative planning for stereotactic radio surgery. EXAM: MRI HEAD WITHOUT AND WITH CONTRAST TECHNIQUE: Multiplanar, multiecho pulse sequences of the brain and surrounding structures were obtained without and with intravenous contrast. CONTRAST:  48mL MULTIHANCE GADOBENATE DIMEGLUMINE 529 MG/ML IV SOLN COMPARISON:  MRI head 03/26/2019 FINDINGS: Brain: Multiple enhancing lesions the brain compatible with widespread metastatic disease. These lesions show susceptibility compatible with prior hemorrhage/melanin content. Lesions are marked on the axial postcontrast  images in PACS. 2 mm area of restricted diffusion right cerebellum without enhancement. This was not seen previously and could represent a small acute infarct versus early metastatic disease. 10 mm right occipital lesion with surrounding edema Punctate 2 mm enhancing lesion right occipital lesion with surrounding edema unchanged 20 mm cystic mass left occipital lobe with surrounding edema Complex hemorrhagic lesion right occipital parietal lobe measuring 43 x 37 mm similar to prior. Surrounding edema and local mass-effect. 11 mm left lateral temporal lesion 43mm enhancing lesion right frontal lobe Punctate 1.5 mm lesion right parietal cortex axial image 133 Punctate 1.5 x 2 mm enhancing lesion left frontal cortex axial image 140 Ventricle size normal.  Mild atrophy.  No midline shift. Vascular: Normal arterial flow voids. Skull and upper cervical spine: No skull lesion. Left frontal scalp resection over the convexity. Sinuses/Orbits: Mild mucosal edema paranasal sinuses. Bilateral cataract surgery Other: None IMPRESSION: At least 9 metastatic deposits in the brain as described above. Most of these show evidence of prior hemorrhage or melanin deposition due to metastatic melanoma. Largest lesion right occipital parietal lobe with surrounding edema and local mass-effect. New punctate area of restricted diffusion right cerebellum without enhancement. This could represent acute infarct due to hypercoagulability or possibly early metastatic disease. Electronically Signed   By: Franchot Gallo M.D.   On: 04/17/2019 16:08   Vas [place] Lower Extremity Venous (dvt) (mc And Wl 7a-7p)  Result Date: 05/13/2019  Lower Venous Study Indications: Swelling.  Comparison Study: No prior study. Performing Technologist: Maudry Mayhew MHA, RDMS, RVT, RDCS  Examination Guidelines: A complete evaluation includes B-mode imaging, spectral Doppler, color Doppler, and power Doppler as needed of all accessible portions of each vessel.  Bilateral testing is considered an integral part of a complete examination. Limited examinations for reoccurring indications may be performed as noted.  +---------+---------------+---------+-----------+----------+--------------+  RIGHT  Compressibility Phasicity Spontaneity Properties Thrombus Aging  +---------+---------------+---------+-----------+----------+--------------+  CFV       Full            Yes       Yes                                    +---------+---------------+---------+-----------+----------+--------------+  SFJ       Full                                                             +---------+---------------+---------+-----------+----------+--------------+  FV Prox   Full                                                             +---------+---------------+---------+-----------+----------+--------------+  FV Mid    Full                                                             +---------+---------------+---------+-----------+----------+--------------+  FV Distal Full                                                             +---------+---------------+---------+-----------+----------+--------------+  PFV       Full                                                             +---------+---------------+---------+-----------+----------+--------------+  POP       Full            Yes       Yes                                    +---------+---------------+---------+-----------+----------+--------------+  PTV       Full                                                             +---------+---------------+---------+-----------+----------+--------------+  PERO      Full                                                             +---------+---------------+---------+-----------+----------+--------------+  +---------+---------------+---------+-----------+----------+-----------------+  LEFT      Compressibility Phasicity Spontaneity Properties Thrombus Aging      +---------+---------------+---------+-----------+----------+-----------------+  CFV       Full            Yes       Yes                                       +---------+---------------+---------+-----------+----------+-----------------+  SFJ       Full                                                                +---------+---------------+---------+-----------+----------+-----------------+  FV Prox   Full                                                                +---------+---------------+---------+-----------+----------+-----------------+  FV Mid    None                      No                     Acute              +---------+---------------+---------+-----------+----------+-----------------+  FV Distal None                      No                     Acute              +---------+---------------+---------+-----------+----------+-----------------+  PFV       Full                                                                +---------+---------------+---------+-----------+----------+-----------------+  POP       Partial                   Yes                    Age Indeterminate  +---------+---------------+---------+-----------+----------+-----------------+  PTV       None                      No                     Acute              +---------+---------------+---------+-----------+----------+-----------------+  PERO      Full                                                                +---------+---------------+---------+-----------+----------+-----------------+  Summary: Right: There is no evidence of deep vein thrombosis in the lower extremity. No cystic structure found in the popliteal fossa. Left: Findings consistent with acute deep vein thrombosis involving the left femoral vein, and left posterior tibial veins. Findings consistent with age indeterminate deep vein thrombosis involving the left popliteal vein. No cystic structure found in the popliteal fossa.  *See table(s) above for measurements  and observations. Electronically signed by Ruta Hinds MD on 05/13/2019 at 11:16:04 AM.    Final     Labs:  CBC: Recent Labs    05/05/19 1055 05/10/19 2013 05/12/19 1537 05/13/19 1132  WBC 6.6 9.4 10.1 9.5  HGB 12.6* 13.3 14.0 12.5*  HCT 37.5* 39.9 41.3 36.3*  PLT 102* 129* 112* 103*    COAGS: Recent Labs    03/25/19 2110  INR 1.1  APTT 30    BMP: Recent Labs    04/05/19 0326 05/05/19 1055 05/10/19 2013 05/12/19 1537  NA 135 138 138 138  K 4.6 4.5 4.4 4.8  CL 104 103 102 97*  CO2 23 28 27 29   GLUCOSE 125* 333* 463* 434*  BUN 32* 26* 38* 39*  CALCIUM 8.6* 8.4* 9.0 9.3  CREATININE 0.96 0.93 0.98 0.99  GFRNONAA >60 >60 >60 >60  GFRAA >60 >60 >60 >60    LIVER FUNCTION TESTS: Recent Labs    03/25/19 1354 05/05/19 1055 05/10/19 2013 05/12/19 1537  BILITOT 0.6 0.4 0.6 1.1  AST 48* 38 41 48*  ALT 24 73* 83* 86*  ALKPHOS 41 41 43 46  PROT 6.8 5.0* 5.5* 5.7*  ALBUMIN 3.8 2.5* 3.0* 2.9*    TUMOR MARKERS: No results for input(s): AFPTM, CEA, CA199, CHROMGRNA in the last 8760 hours.  Assessment and Plan:  78 y/o M with history of metastatic melanoma with known hemorrhagic brain metastases who presented to [facility] ED yesterday with complaints of lower extremity swelling - he was found to have acute LLE DVT and small PE. Due to his history of hemorrhagic brain metastases he is not a candidate for anticoagulation and IR has been asked to place an IVC filter given his hypercoagulable state 2/2 cancer. Patient has been reviewed by Dr. Kathlene Cote today who approves procedure. I discussed the procedure with the patient and his son Talen Lemar via phone today and both are in agreement with proceeding.  Will plan for IVC filter placement tomorrow in IR - I have placed orders for patient to be NPO at midnight and for AM labs to be drawn. IR will call for patient when ready.  Risks and benefits discussed with the patient and his son, Ordean Murley, via phone including, but not  limited to bleeding, infection, contrast induced renal failure, filter fracture or migration which can lead to emergency surgery or even death, strut penetration with damage or irritation to adjacent structures and caval thrombosis.  All of the patient and his son's questions were answered, patient and his son are agreeable to proceed.   Consent signed and in IR control room.  Thank you for this interesting consult.  I greatly enjoyed meeting KAULANA BUNKER and look forward to participating in their care.  A copy of this report was sent to the requesting provider on this date.  Electronically Signed: Joaquim Nam, PA-C 05/13/2019, 3:43 PM   I spent a total of 20 Minutes in face to face in clinical consultation, greater than 50% of which was counseling/coordinating care for IVC filter placement.

## 2019-05-13 NOTE — TOC Initial Note (Signed)
Transition of Care [facility]) - Initial/Assessment Note    Patient Details  Name: Randall Mcgee MRN: [id number] Date of Birth: 1940-11-23  Transition of Care [facility]) CM/SW Contact:    Benard Halsted, [place] Phone Number: 05/13/2019, 3:55 PM  Clinical Narrative:                 Patient presents with high readmission risk score. CSW following for potential needs.     Barriers to Discharge: Continued Medical Work up   Patient Goals and CMS Choice        Expected Discharge Plan and Services     Discharge Planning Services: CM Consult   Living arrangements for the past 2 months: Single Family Home                 DME Arranged: N/A DME Agency: NA       HH Arranged: NA [place] Agency: NA        Prior Living Arrangements/Services Living arrangements for the past 2 months: [name]   Patient language and need for interpreter reviewed:: Yes Do you feel safe going back to the place where you live?: Yes      Need for Family Participation in Patient Care: Yes (Comment) Care giver support system in place?: Yes (comment) Current home services: DME Criminal Activity/Legal Involvement Pertinent to Current Situation/Hospitalization: No - Comment as needed  Activities of Daily Living      Permission Sought/Granted                  Emotional Assessment       Orientation: : Oriented to Self, Oriented to Place, Oriented to  Time, Oriented to Situation Alcohol / Substance Use: Not Applicable Psych Involvement: No (comment)  Admission diagnosis:  Hyperglycemia [R73.9] Other acute pulmonary embolism, unspecified whether acute cor pulmonale present ([place]) [I26.99] Patient Active Problem List   Diagnosis Date Noted  . Left leg DVT ([place]) 05/12/2019  . Acute pulmonary embolism ([place]) 05/12/2019  . Mass of right lung 05/12/2019  . Right adrenal mass ([place]) 05/12/2019  . Uncontrolled diabetes mellitus with hyperglycemia ([place]) 05/12/2019  . Hyperglycemia   . Encounter for  antineoplastic immunotherapy 05/05/2019  . Goals of care, counseling/discussion 05/05/2019  . Brain metastases ([place]) 03/26/2019  . ICH (intracerebral hemorrhage) ([place]) 03/25/2019  . Erectile dysfunction 06/19/2018  . Anxiety state 06/19/2018  . Status post split thickness skin graft 09/08/2016  . Melanoma of skin ([place]) 07/13/2016  . Malignant melanoma of scalp ([place]) 05/29/2016  . Elevated TSH 07/27/2015  . Impaired glucose tolerance 07/27/2015  . Carotid artery stenosis 11/07/2011  . Right inguinal hernia 11/07/2011  . Hypertension 10/03/2010  . Hypercholesterolemia 10/03/2010   PCP:  Isaac Bliss, Rayford Halsted, MD Pharmacy:   [facility] [place], [place] AT [facility] [address] [place] [address] Phone: [phone] Fax: [phone]     Social Determinants of Health (SDOH) Interventions    Readmission Risk Interventions No flowsheet data found.

## 2019-05-13 NOTE — Telephone Encounter (Signed)
noted 

## 2019-05-14 ENCOUNTER — Telehealth: Payer: Self-pay

## 2019-05-14 ENCOUNTER — Inpatient Hospital Stay (HOSPITAL_COMMUNITY): Payer: Medicare Other

## 2019-05-14 ENCOUNTER — Encounter (HOSPITAL_COMMUNITY): Payer: Self-pay | Admitting: Interventional Radiology

## 2019-05-14 DIAGNOSIS — I82412 Acute embolism and thrombosis of left femoral vein: Secondary | ICD-10-CM | POA: Diagnosis not present

## 2019-05-14 DIAGNOSIS — C434 Malignant melanoma of scalp and neck: Secondary | ICD-10-CM | POA: Diagnosis not present

## 2019-05-14 HISTORY — PX: IR IVC FILTER PLMT / S&I /IMG GUID/MOD SED: IMG701

## 2019-05-14 LAB — GLUCOSE, CAPILLARY
Glucose-Capillary: 189 mg/dL — ABNORMAL HIGH (ref 70–99)
Glucose-Capillary: 165 mg/dL — ABNORMAL HIGH (ref 70–99)
Glucose-Capillary: 330 mg/dL — ABNORMAL HIGH (ref 70–99)
Glucose-Capillary: 368 mg/dL — ABNORMAL HIGH (ref 70–99)

## 2019-05-14 LAB — LEGIONELLA PNEUMOPHILA SEROGP 1 UR AG: L. pneumophila Serogp 1 Ur Ag: NEGATIVE

## 2019-05-14 MED ORDER — IOHEXOL 300 MG/ML  SOLN
100.0000 mL | Freq: Once | INTRAMUSCULAR | Status: AC | PRN
  Administered 2019-05-14: 55 mL via INTRAVENOUS

## 2019-05-14 MED ORDER — MIDAZOLAM HCL 2 MG/2ML IJ SOLN
INTRAMUSCULAR | Status: AC
  Filled 2019-05-14: qty 2

## 2019-05-14 MED ORDER — POLYETHYLENE GLYCOL 3350 17 G PO PACK
17.0000 g | PACK | Freq: Two times a day (BID) | ORAL | Status: DC
  Administered 2019-05-14 – 2019-05-15 (×3): 17 g via ORAL
  Filled 2019-05-14 (×6): qty 1

## 2019-05-14 MED ORDER — FENTANYL CITRATE (PF) 100 MCG/2ML IJ SOLN
INTRAMUSCULAR | Status: AC | PRN
  Administered 2019-05-14: 50 ug via INTRAVENOUS
  Administered 2019-05-14: 25 ug via INTRAVENOUS

## 2019-05-14 MED ORDER — FENTANYL CITRATE (PF) 100 MCG/2ML IJ SOLN
INTRAMUSCULAR | Status: AC
  Filled 2019-05-14: qty 2

## 2019-05-14 MED ORDER — LIDOCAINE HCL 1 % IJ SOLN
INTRAMUSCULAR | Status: AC | PRN
  Administered 2019-05-14: 10 mL

## 2019-05-14 MED ORDER — MIDAZOLAM HCL 2 MG/2ML IJ SOLN
INTRAMUSCULAR | Status: AC | PRN
  Administered 2019-05-14: 0.5 mg via INTRAVENOUS
  Administered 2019-05-14: 1 mg via INTRAVENOUS

## 2019-05-14 MED ORDER — LIDOCAINE HCL 1 % IJ SOLN
INTRAMUSCULAR | Status: AC
  Filled 2019-05-14: qty 20

## 2019-05-14 NOTE — NC FL2 (Signed)
[place] LEVEL OF CARE SCREENING TOOL     IDENTIFICATION  Patient Name: Randall Mcgee Birthdate: 1941-05-06 Sex: male Admission Date (Current Location): 05/12/2019  [facility] and [place] Number:  Herbalist and Address:  [facility] [place]. [facility], [place] [address] [place],  [address]      Provider Number: [id number]  Attending Physician Name and Address:  Elmarie Shiley, MD  Relative Name and Phone Number:  [place], son, [phone]    Current Level of Care: Hospital Recommended Level of Care: [place] Prior Approval Number:    Date Approved/Denied:   PASRR Number: [id number]  Discharge Plan: SNF    Current Diagnoses: Patient Active Problem List   Diagnosis Date Noted  . Left leg DVT ([place]) 05/12/2019  . Acute pulmonary embolism ([place]) 05/12/2019  . Mass of right lung 05/12/2019  . Right adrenal mass ([place]) 05/12/2019  . Uncontrolled diabetes mellitus with hyperglycemia ([place]) 05/12/2019  . Hyperglycemia   . Encounter for antineoplastic immunotherapy 05/05/2019  . Goals of care, counseling/discussion 05/05/2019  . Brain metastases ([place]) 03/26/2019  . ICH (intracerebral hemorrhage) ([place]) 03/25/2019  . Erectile dysfunction 06/19/2018  . Anxiety state 06/19/2018  . Status post split thickness skin graft 09/08/2016  . Melanoma of skin ([place]) 07/13/2016  . Malignant melanoma of scalp ([place]) 05/29/2016  . Elevated TSH 07/27/2015  . Impaired glucose tolerance 07/27/2015  . Carotid artery stenosis 11/07/2011  . Right inguinal hernia 11/07/2011  . Hypertension 10/03/2010  . Hypercholesterolemia 10/03/2010    Orientation RESPIRATION BLADDER Height & Weight     Self, Time, Situation, Place  Normal Continent Weight: 208 lb (94.3 kg) Height:  5\' 10"  (177.8 cm)  BEHAVIORAL SYMPTOMS/MOOD NEUROLOGICAL BOWEL NUTRITION STATUS      Continent Diet(Please see DC Summary)  AMBULATORY STATUS COMMUNICATION OF NEEDS Skin    Limited Assist Verbally Other (Comment)(Puncture on throat from procedure)                       Personal Care Assistance Level of Assistance  Bathing, Feeding, Dressing Bathing Assistance: Limited assistance Feeding assistance: Limited assistance Dressing Assistance: Limited assistance     Functional Limitations Info  Sight, Hearing, Speech Sight Info: Adequate Hearing Info: Adequate Speech Info: Adequate    SPECIAL CARE FACTORS FREQUENCY  PT (By licensed PT), OT (By licensed OT)     PT Frequency: 5x OT Frequency: 5x            Contractures Contractures Info: Not present    Additional Factors Info  Code Status, Allergies, Insulin Sliding Scale Code Status Info: Full Allergies Info: NKA   Insulin Sliding Scale Info: See DC Summary for dose       Current Medications (05/14/2019):  This is the current hospital active medication list Current Facility-Administered Medications  Medication Dose Route Frequency Provider Last Rate Last Dose  . acetaminophen (TYLENOL) tablet 650 mg  650 mg Oral Q6H PRN Opyd, Ilene Qua, MD   650 mg at 05/13/19 1450   Or  . acetaminophen (TYLENOL) suppository 650 mg  650 mg Rectal Q6H PRN Opyd, Ilene Qua, MD      . ALPRAZolam Duanne Moron) tablet 0.5 mg  0.5 mg Oral TID PRN Opyd, Ilene Qua, MD      . amLODipine (NORVASC) tablet 5 mg  5 mg Oral Daily Opyd, Ilene Qua, MD   5 mg at 05/14/19 0913  . azithromycin (ZITHROMAX) 500 mg in sodium chloride 0.9 %  250 mL IVPB  500 mg Intravenous Q24H Vianne Bulls, MD 250 mL/hr at 05/13/19 2209 500 mg at 05/13/19 2209  . cefTRIAXone (ROCEPHIN) 2 g in sodium chloride 0.9 % 100 mL IVPB  2 g Intravenous Q24H Opyd, Ilene Qua, MD 200 mL/hr at 05/13/19 2128 2 g at 05/13/19 2128  . dexamethasone (DECADRON) tablet 4 mg  4 mg Oral BID Opyd, Ilene Qua, MD   4 mg at 05/14/19 0913  . fluconazole (DIFLUCAN) tablet 100 mg  100 mg Oral Daily Opyd, Ilene Qua, MD   100 mg at 05/14/19 0913  . HYDROcodone-acetaminophen  (NORCO/VICODIN) 5-325 MG per tablet 1-2 tablet  1-2 tablet Oral Q4H PRN Opyd, Ilene Qua, MD   1 tablet at 05/12/19 2136  . insulin aspart (novoLOG) injection 0-5 Units  0-5 Units Subcutaneous QHS Vianne Bulls, MD   2 Units at 05/13/19 2124  . insulin aspart (novoLOG) injection 0-9 Units  0-9 Units Subcutaneous TID WC Opyd, Ilene Qua, MD   2 Units at 05/14/19 1311  . insulin glargine (LANTUS) injection 10 Units  10 Units Subcutaneous Daily [place] Shi, MD   10 Units at 05/14/19 0912  . ondansetron (ZOFRAN) tablet 4 mg  4 mg Oral Q6H PRN Opyd, Ilene Qua, MD       Or  . ondansetron (ZOFRAN) injection 4 mg  4 mg Intravenous Q6H PRN Opyd, Ilene Qua, MD      . polyethylene glycol (MIRALAX / GLYCOLAX) packet 17 g  17 g Oral Daily PRN Opyd, Ilene Qua, MD      . polyethylene glycol (MIRALAX / GLYCOLAX) packet 17 g  17 g Oral BID Regalado, Belkys A, MD   17 g at 05/14/19 1312  . propranolol (INDERAL) tablet 40 mg  40 mg Oral BID Opyd, Ilene Qua, MD   40 mg at 05/14/19 0913  . sodium chloride flush (NS) 0.9 % injection 3 mL  3 mL Intravenous Q12H Opyd, Ilene Qua, MD   3 mL at 05/14/19 [id number]     Discharge Medications: Please see discharge summary for a list of discharge medications.  Relevant Imaging Results:  Relevant Lab Results:   Additional Information SSN: [id number]      COVID negative on 10/19  Lissa Morales Rafan Sanders, LCSW

## 2019-05-14 NOTE — Procedures (Signed)
Interventional Radiology Procedure Note  Procedure: Placement of IVC filter  Complications: None  Estimated Blood Loss: None  Recommendations: - Bedrest with HOB elevated x 2 hrs -  Due to underlying comorbidities, This is considered a permanent device and no attempt will be made to follow for retrieval  Signed,  Criselda Peaches, MD

## 2019-05-14 NOTE — Telephone Encounter (Signed)
Verbal orders for PT given to Sjreesh.

## 2019-05-14 NOTE — Progress Notes (Signed)
HEMATOLOGY-ONCOLOGY PROGRESS NOTE  SUBJECTIVE: Mr. Mullings underwent IVC filter placement earlier today.  He tolerated the procedure quite well.  States that he had no pain whatsoever.  Reports that his breathing is stable.  Has no cough.  He has no chest discomfort.  Denies headaches or dizziness.  Has not yet been out of the bed.  The patient's son has arrived from out of state.  He has expressed that he thinks his father needs to go to a skilled nursing facility for rehabilitation before he can consider going back home.  Oncology History  Malignant melanoma of scalp ([place])  05/29/2016 Initial Diagnosis   Malignant melanoma of scalp ([place])   05/13/2019 -  Chemotherapy   The patient had ipilimumab (YERVOY) 270 mg in sodium chloride 0.9 % 100 mL chemo infusion, 3 mg/kg = 270 mg, Intravenous,  Once, 0 of 4 cycles nivolumab (OPDIVO) 90 mg in sodium chloride 0.9 % 50 mL chemo infusion, 1 mg/kg = 90 mg, Intravenous, Once, 0 of 10 cycles  for chemotherapy treatment.      REVIEW OF SYSTEMS:   Constitutional: Denies fevers, chills Respiratory: Denies cough, dyspnea or wheezes Cardiovascular: Denies palpitation, chest discomfort Gastrointestinal:  Denies nausea, heartburn or change in bowel habits Skin: Denies abnormal skin rashes Lymphatics: Denies new lymphadenopathy or easy bruising Neurological:Denies numbness, tingling or new weaknesses Behavioral/Psych: Mood is stable, no new changes  Extremities: Reports lower extremity edema All other systems were reviewed with the patient and are negative.  I have reviewed the past medical history, past surgical history, social history and family history with the patient and they are unchanged from previous note.   PHYSICAL EXAMINATION: ECOG PERFORMANCE STATUS: 2 - Symptomatic, <50% confined to bed  Vitals:   05/14/19 1220 05/14/19 1423  BP: 112/77 113/73  Pulse: 60 74  Resp: 12 18  Temp:  98.1 F (36.7 C)  SpO2: 92% 90%   Filed Weights    05/12/19 1511  Weight: 208 lb (94.3 kg)    Intake/Output from previous day: 10/20 0701 - 10/21 0700 In: 250 [IV Piggyback:250] Out: 1150 [Urine:1150]  GENERAL:alert, no distress and comfortable SKIN: skin color, texture, turgor are normal, no rashes or significant lesions EYES: normal, Conjunctiva are pink and non-injected, sclera clear OROPHARYNX: Thrush noted to tongue LUNGS: clear to auscultation and percussion with normal breathing effort HEART: regular rate & rhythm and no murmurs.  trace pitting edema in the bilateral lower extremities. ABDOMEN:abdomen soft, non-tender and normal bowel sounds Musculoskeletal:no cyanosis of digits and no clubbing  NEURO: alert & oriented x 3 with fluent speech, no focal motor/sensory deficits  LABORATORY DATA:  I have reviewed the data as listed CMP Latest Ref Rng & Units 05/12/2019 05/10/2019 05/05/2019  Glucose 70 - 99 mg/dL 434(H) 463(H) 333(H)  BUN 8 - 23 mg/dL 39(H) 38(H) 26(H)  Creatinine 0.61 - 1.24 mg/dL 0.99 0.98 0.93  Sodium 135 - 145 mmol/L 138 138 138  Potassium 3.5 - 5.1 mmol/L 4.8 4.4 4.5  Chloride 98 - 111 mmol/L 97(L) 102 103  CO2 22 - 32 mmol/L 29 27 28   Calcium 8.9 - 10.3 mg/dL 9.3 9.0 8.4(L)  Total Protein 6.5 - 8.1 g/dL 5.7(L) 5.5(L) 5.0(L)  Total Bilirubin 0.3 - 1.2 mg/dL 1.1 0.6 0.4  Alkaline Phos 38 - 126 U/L 46 43 41  AST 15 - 41 U/L 48(H) 41 38  ALT 0 - 44 U/L 86(H) 83(H) 73(H)    Lab Results  Component Value Date   WBC  9.5 05/13/2019   HGB 12.5 (L) 05/13/2019   HCT 36.3 (L) 05/13/2019   MCV 97.1 05/13/2019   PLT 103 (L) 05/13/2019   NEUTROABS 8.3 (H) 05/12/2019    Dg Chest 2 View  Result Date: 05/12/2019 CLINICAL DATA:  Weakness, lower extremity edema, history of metastatic cancer EXAM: CHEST - 2 VIEW COMPARISON:  03/25/2019 FINDINGS: The heart size and mediastinal contours are within normal limits. Unchanged elevation of the left hemidiaphragm without acute appearing airspace opacity. Disc degenerative  disease and ankylosis of the thoracic spine. IMPRESSION: Unchanged elevation of the left hemidiaphragm without acute appearing airspace opacity. Electronically Signed   By: Eddie Candle M.D.   On: 05/12/2019 16:59   Ct Head Wo Contrast  Result Date: 05/12/2019 CLINICAL DATA:  Hypoglycemia, pedal edema, history hypertension, melanoma EXAM: CT HEAD WITHOUT CONTRAST TECHNIQUE: Contiguous axial images were obtained from the base of the skull through the vertex without intravenous contrast. Sagittal and coronal MPR images reconstructed from axial data set. COMPARISON:  CT head 03/27/2019 FINDINGS: Brain: Generalized atrophy. Mild ex vacuo dilatation of the ventricular system. Again identified hyperdense focus at the posterior RIGHT parieto-occipital lobe 2.5 x 2.3 cm consistent with intraparenchymal hemorrhage, decreased in size. Persistent residual surrounding edema. Smaller LEFT except a hemorrhage seen on the previous exam has decreased in size as well, now 1.8 x 1.3 cm and demonstrating a fluid/fluid level. Decreased attenuation and size of previously seen LEFT temporal lobe hemorrhage, now 10 x 9 mm. Small vessel chronic ischemic changes of deep cerebral white matter. No new areas of intracranial hemorrhage, mass lesion or infarction. No extra-axial fluid collections. Vascular: Atherosclerotic calcifications of internal carotid arteries at skull base. Skull: Intact Sinuses/Orbits: Clear Other: N/A IMPRESSION: Atrophy with small vessel chronic ischemic changes of deep cerebral white matter. Interval decrease in sizes of previously seen intraparenchymal hemorrhages in both hemispheres at sites of known hemorrhagic metastases. No new intracranial abnormalities. Electronically Signed   By: Lavonia Dana M.D.   On: 05/12/2019 17:13   Ct Angio Chest Pe W/cm &/or Wo Cm  Result Date: 05/12/2019 CLINICAL DATA:  Bilateral lower extremity swelling. EXAM: CT ANGIOGRAPHY CHEST WITH CONTRAST TECHNIQUE: Multidetector CT  imaging of the chest was performed using the standard protocol during bolus administration of intravenous contrast. Multiplanar CT image reconstructions and MIPs were obtained to evaluate the vascular anatomy. CONTRAST:  121mL OMNIPAQUE IOHEXOL 350 MG/ML SOLN COMPARISON:  None. FINDINGS: Cardiovascular: Small filling defects are noted in upper and lower lobe branches of the right pulmonary artery. RV/LV ratio of 1.0 is noted. Normal cardiac size. No pericardial effusion. Atherosclerosis of thoracic aorta is noted without aneurysm or dissection. Mediastinum/Nodes: Thyroid gland and esophagus are unremarkable. Extensive mediastinal adenopathy is noted. This includes 3 cm subcarinal adenopathy, 2.3 cm prevascular adenopathy, 2.3 cm left paratracheal adenopathy and 12 mm right hilar lymph node. 17 mm left supraclavicular lymph node is noted. Lungs/Pleura: No pneumothorax is noted. Left upper lobe atelectasis or infiltrate is noted. 36 x 28 mm probable necrotic mass is noted in the right posterior costophrenic sulcus. Upper Abdomen: 16 mm right adrenal nodule is noted. Musculoskeletal: No chest wall abnormality. No acute or significant osseous findings. Review of the MIP images confirms the above findings. IMPRESSION: Small filling defects are noted in upper and lower lobe branches of right pulmonary artery consistent with small pulmonary emboli. Positive for acute PE with CT evidence of right heart strain (RV/LV Ratio = 1.0) consistent with at least submassive (intermediate risk) PE. The presence  of right heart strain has been associated with an increased risk of morbidity and mortality. Please activate Code PE by paging [phone]. 3.6 x 2.8 cm probable necrotic mass is noted in the right posterior costophrenic sulcus of the right lung concerning for malignancy. Right hilar, left supraclavicular, left paratracheal, subcarinal and prevascular adenopathy is noted concerning for metastatic disease. Critical  Value/emergent results were called by telephone at the time of interpretation on 05/12/2019 at 7:02 pm to providerRACHEL LITTLE , who verbally acknowledged these results. Mild left upper lobe atelectasis or infiltrate is noted. 16 mm right adrenal nodule is noted. MRI is recommended to rule out metastatic disease. Aortic Atherosclerosis (ICD10-I70.0). Electronically Signed   By: Marijo Conception M.D.   On: 05/12/2019 19:02   Mr Jeri Cos [id number] Contrast  Result Date: 04/17/2019 CLINICAL DATA:  Metastatic melanoma. Preoperative planning for stereotactic radio surgery. EXAM: MRI HEAD WITHOUT AND WITH CONTRAST TECHNIQUE: Multiplanar, multiecho pulse sequences of the brain and surrounding structures were obtained without and with intravenous contrast. CONTRAST:  39mL MULTIHANCE GADOBENATE DIMEGLUMINE 529 MG/ML IV SOLN COMPARISON:  MRI head 03/26/2019 FINDINGS: Brain: Multiple enhancing lesions the brain compatible with widespread metastatic disease. These lesions show susceptibility compatible with prior hemorrhage/melanin content. Lesions are marked on the axial postcontrast images in PACS. 2 mm area of restricted diffusion right cerebellum without enhancement. This was not seen previously and could represent a small acute infarct versus early metastatic disease. 10 mm right occipital lesion with surrounding edema Punctate 2 mm enhancing lesion right occipital lesion with surrounding edema unchanged 20 mm cystic mass left occipital lobe with surrounding edema Complex hemorrhagic lesion right occipital parietal lobe measuring 43 x 37 mm similar to prior. Surrounding edema and local mass-effect. 11 mm left lateral temporal lesion 40mm enhancing lesion right frontal lobe Punctate 1.5 mm lesion right parietal cortex axial image 133 Punctate 1.5 x 2 mm enhancing lesion left frontal cortex axial image 140 Ventricle size normal.  Mild atrophy.  No midline shift. Vascular: Normal arterial flow voids. Skull and upper cervical  spine: No skull lesion. Left frontal scalp resection over the convexity. Sinuses/Orbits: Mild mucosal edema paranasal sinuses. Bilateral cataract surgery Other: None IMPRESSION: At least 9 metastatic deposits in the brain as described above. Most of these show evidence of prior hemorrhage or melanin deposition due to metastatic melanoma. Largest lesion right occipital parietal lobe with surrounding edema and local mass-effect. New punctate area of restricted diffusion right cerebellum without enhancement. This could represent acute infarct due to hypercoagulability or possibly early metastatic disease. Electronically Signed   By: Franchot Gallo M.D.   On: 04/17/2019 16:08   Ir Ivc Filter Plmt / S&i /img Guid/mod Sed  Result Date: 05/14/2019 INDICATION: 78 year old male with a history of metastatic melanoma including hemorrhagic brain metastases. He has new acute DVT in the left lower extremity. His history of hemorrhagic brain Mets represents an absolute contraindication to anticoagulation. Therefore, he is a candidate for caval interruption as a form of PE prophylaxis. EXAM: ULTRASOUND GUIDANCE FOR VASCULARACCESS IVC CATHETERIZATION AND VENOGRAM IVC FILTER INSERTION Interventional Radiologist:  Criselda Peaches, MD MEDICATIONS: None. ANESTHESIA/SEDATION: Fentanyl 75 mcg IV; Versed 1.5 mg IV Moderate Sedation Time:  13 minutes The patient was continuously monitored during the procedure by the interventional radiology nurse under my direct supervision. FLUOROSCOPY TIME:  Fluoroscopy Time: 0 minutes 48 seconds (52 mGy). COMPLICATIONS: None immediate. PROCEDURE: Informed written consent was obtained from the patient after a thorough discussion of the procedural risks, benefits  and alternatives. All questions were addressed. Maximal Sterile Barrier Technique was utilized including caps, mask, sterile gowns, sterile gloves, sterile drape, hand hygiene and skin antiseptic. A timeout was performed prior to the  initiation of the procedure. Maximal barrier sterile technique utilized including caps, mask, sterile gowns, sterile gloves, large sterile drape, hand hygiene, and Betadine prep. Under sterile condition and local anesthesia, right internal jugular venous access was performed with ultrasound. An ultrasound image was saved and sent to PACS. Over a guidewire, the IVC filter delivery sheath and inner dilator were advanced into the IVC just above the IVC bifurcation. Contrast injection was performed for an IVC venogram. Through the delivery sheath, a retrievable Denali IVC filter was deployed below the level of the renal veins and above the IVC bifurcation. Limited post deployment venacavagram was performed. The delivery sheath was removed and hemostasis was obtained with manual compression. A dressing was placed. The patient tolerated the procedure well without immediate post procedural complication. FINDINGS: The IVC is patent. No evidence of thrombus, stenosis, or occlusion. No variant venous anatomy. Successful placement of the IVC filter below the level of the renal veins. IMPRESSION: Successful ultrasound and fluoroscopically guided placement of an infrarenal retrievable IVC filter via right jugular approach. PLAN: Due to patient related comorbidities and/or clinical necessity, this IVC filter should be considered a permanent device. This patient will not be actively followed for future filter retrieval. Electronically Signed   By: Jacqulynn Cadet M.D.   On: 05/14/2019 12:42   Vas [place] Lower Extremity Venous (dvt) (mc And Wl 7a-7p)  Result Date: 05/13/2019  Lower Venous Study Indications: Swelling.  Comparison Study: No prior study. Performing Technologist: Maudry Mayhew MHA, RDMS, RVT, RDCS  Examination Guidelines: A complete evaluation includes B-mode imaging, spectral Doppler, color Doppler, and power Doppler as needed of all accessible portions of each vessel. Bilateral testing is considered an  integral part of a complete examination. Limited examinations for reoccurring indications may be performed as noted.  +---------+---------------+---------+-----------+----------+--------------+ RIGHT    CompressibilityPhasicitySpontaneityPropertiesThrombus Aging +---------+---------------+---------+-----------+----------+--------------+ CFV      Full           Yes      Yes                                 +---------+---------------+---------+-----------+----------+--------------+ SFJ      Full                                                        +---------+---------------+---------+-----------+----------+--------------+ FV Prox  Full                                                        +---------+---------------+---------+-----------+----------+--------------+ FV Mid   Full                                                        +---------+---------------+---------+-----------+----------+--------------+ FV DistalFull                                                        +---------+---------------+---------+-----------+----------+--------------+  PFV      Full                                                        +---------+---------------+---------+-----------+----------+--------------+ POP      Full           Yes      Yes                                 +---------+---------------+---------+-----------+----------+--------------+ PTV      Full                                                        +---------+---------------+---------+-----------+----------+--------------+ PERO     Full                                                        +---------+---------------+---------+-----------+----------+--------------+  +---------+---------------+---------+-----------+----------+-----------------+ LEFT     CompressibilityPhasicitySpontaneityPropertiesThrombus Aging     +---------+---------------+---------+-----------+----------+-----------------+ CFV      Full           Yes      Yes                                    +---------+---------------+---------+-----------+----------+-----------------+ SFJ      Full                                                           +---------+---------------+---------+-----------+----------+-----------------+ FV Prox  Full                                                           +---------+---------------+---------+-----------+----------+-----------------+ FV Mid   None                    No                   Acute             +---------+---------------+---------+-----------+----------+-----------------+ FV DistalNone                    No                   Acute             +---------+---------------+---------+-----------+----------+-----------------+ PFV      Full                                                           +---------+---------------+---------+-----------+----------+-----------------+  POP      Partial                 Yes                  Age Indeterminate +---------+---------------+---------+-----------+----------+-----------------+ PTV      None                    No                   Acute             +---------+---------------+---------+-----------+----------+-----------------+ PERO     Full                                                           +---------+---------------+---------+-----------+----------+-----------------+  Summary: Right: There is no evidence of deep vein thrombosis in the lower extremity. No cystic structure found in the popliteal fossa. Left: Findings consistent with acute deep vein thrombosis involving the left femoral vein, and left posterior tibial veins. Findings consistent with age indeterminate deep vein thrombosis involving the left popliteal vein. No cystic structure found in the popliteal fossa.  *See table(s) above for measurements  and observations. Electronically signed by Ruta Hinds MD on 05/13/2019 at 11:16:04 AM.    Final     ASSESSMENT AND PLAN: 1.  Metastatic melanoma 2.  Submassive PE, left lower extremity DVT, status post IVC filter placement on 05/14/2019 3.  Hemorrhagic brain metastases 4.  ?  Pneumonia 5.  Hyperglycemia secondary to dexamethasone 6.  Hypertension 7.  Anxiety  -The patient was scheduled to begin immunotherapy with ipilimumab and nivolumab today at the cancer center.  Appointment has been canceled secondary to the patient's admission.  We will plan to reschedule his treatment once he has been discharged from the hospital. -I had a lengthy discussion with the patient and his son today regarding his metastatic melanoma.  I have discussed with the patient that he has an incurable malignancy but that we can treat it.  The patient seems surprised by this information and was not aware that his disease was not curable.  This was discussed at his last visit at the cancer center.  The patient has indicated that he has a living well, but he is not sure where it is.  It would be helpful if the patient's family could bring a copy in for our records.  I discussed CODE STATUS with the patient and his son and the patient initially indicated that he would not want CPR or mechanical ventilation.  However, his son has asked for time for them to discuss it privately.  I have not changed any orders related to [place].  The patient and his son are looking forward to speaking with the palliative care team for ongoing discussion of goals of care.   -Continue dexamethasone 4 mg twice a day.  We will wait to taper this until after he is discharged from the hospital.  He will need continued aggressive management of his blood sugars. -Recommend PT/OT evaluation.  I agree with the patient going to a skilled nursing facility for rehabilitation.  I have placed a consult to social work per the son's request to begin this  process.   LOS: 2 days   Mikey Bussing, DNP, AGPCNP-BC,  AOCNP 05/14/19

## 2019-05-14 NOTE — Progress Notes (Signed)
RN called by CCMD to relay finding of ST elevation on tele. EKG performed. No ST elevation on 12 lead. Pt has no chest pain. VSS. KJKG, NP [facility]

## 2019-05-14 NOTE — Telephone Encounter (Signed)
Copied from [place] [phone]. Topic: General - Other >> May 14, 2019  3:39 PM Rainey Pines A wrote: Nel[facility] Labella home health nurse is requesting verbal order for pr extension PT 1w4 Best callback [phone]

## 2019-05-14 NOTE — Telephone Encounter (Signed)
Ok to order PT?

## 2019-05-14 NOTE — Progress Notes (Signed)
Notified Baltazar Najjar, NP the results of EKG taken at 2013: NSR, nonsepcific intraventricular conduction delay, nonspecific ST and T wave abnormality, HR 81. EKG didn't upload into epic. Paper copy of EKG in pt's chart at nurse's station. Baltazar Najjar, NP stated she would come look at EKG in pt's chart. Will continue to monitor pt. Ranelle Oyster, RN

## 2019-05-14 NOTE — Progress Notes (Signed)
PROGRESS NOTE    Randall Mcgee  [id number] DOB: 1941/01/12 DOA: 05/12/2019 PCP: Isaac Bliss, Rayford Halsted, MD   Brief Narrative: 78 year old with past medical history significant for metastatic melanoma with hemorrhagic brain metastases and hypertension now presenting to the emergency department with leg swelling and hyperglycemia on outpatient blood work.  Patient had melanoma resected in 2017, was found to have hemorrhagic brain metastases last month, was treated with radiation and immunotherapy has been taking Decadron and was directed to the ED for evaluation of hyperglycemia on outpatient blood work.  Patient complaining of a swelling to bilateral feet.  Patient reports recent bilateral leg swelling.  He was seen in the emergency department a couple of days ago for these, was given Lasix and reports improvement.  He also had trouble recently with hyperglycemia, likely secondary to Decadron and reports having outpatient blood work that was concerning for hyperglycemia.  Patient denies any chest pain.  He has been short of breath but has not noted any acute changes in this. Evaluation in the ED patient was found to be afebrile, satting in the upper 80s and low 90s.  EKG sinus rhythm with left bundle branch block.  CBC with history of thrombocytopenia, lactic acid 2.9.  CTA chest is concerning for pulmonary emboli right pulmonary artery with possible right heart strain, possible necrotic mass in the right and possible pneumonia in the left upper lobe.     Assessment & Plan:   Principal Problem:   Acute pulmonary embolism (HCC) Active Problems:   Hypertension   Malignant melanoma of scalp (HCC)   ICH (intracerebral hemorrhage) (HCC)   Brain metastases (HCC)   Left leg DVT (HCC)   Mass of right lung   Right adrenal mass (HCC)   Uncontrolled diabetes mellitus with hyperglycemia (HCC)  1-acute PE, left lower extremity DVT: -Patient presented with acute PE and lower extremity DVT.   Unable to be a started on anticoagulation due to hemorrhagic brain metastasis and risk for worsening intracerebral bleed. -Case was discussed with neurology, neurosurgery and oncology all who agree the patient is too high risk for blood thinner due to hemorrhagic brain metastasis. -Patient underwent IVC filter placement on 10/21st. -Palliative  has been consulted for further goals of care. -Echo with normal right ventricular heart strain  2-Metastatic melanoma, hemorrhagic brain metastasis ; Side history of metastatic melanoma status post an outpatient surgery in 2017, found to have brain and lung lesions recently underwent radiation and immunotherapy. CT head during this admission showed decrease in size of hemorrhagic brain mets CT chest showed necrotic mass involving the right lung. On Decadron.  Oncology following.  3-Hyperglycemia related to steroids Continue with Lantus.  Continue with a sliding scale insulin. Will increase Lantus further.  Hypertension: On Norvasc  Anxiety: Xanax as needed  Pneumonia: Continue with IV antibiotics. Confusion: This could be multifactorial, related to pneumonia infection, prior brain metastasis, hyperglycemia.     Estimated body mass index is 29.84 kg/m as calculated from the following:   Height as of this encounter: 5\' 10"  (1.778 m).   Weight as of this encounter: 94.3 kg.   DVT prophylaxis: TED hose Code Status: Full code Family Communication: Son at bedside Disposition Plan: Remain in the hospital for treatment of pneumonia and need for further discussion of goals of care Consultants:   Oncology  IR  Neurology  Procedures:   IVC placement 10/21  Antimicrobials:  Ceftriaxone  Subjective: He is hungry he wants to be able to cheeseburger.  He is breathing okay. Lower extremity edema improved.  He feels confused at times  Objective: Vitals:   05/14/19 1210 05/14/19 1215 05/14/19 1220 05/14/19 1423  BP: 107/77 107/75 112/77  113/73  Pulse: 62 63 60 74  Resp: 14 17 12 18   Temp:    98.1 F (36.7 C)  TempSrc:    Oral  SpO2: 96% 95% 92% 90%  Weight:      Height:        Intake/Output Summary (Last 24 hours) at 05/14/2019 1518 Last data filed at 05/14/2019 0854 Gross per 24 hour  Intake --  Output 850 ml  Net -850 ml   Filed Weights   05/12/19 1511  Weight: 94.3 kg    Examination:  General exam: Appears calm and comfortable  Respiratory system: Clear to auscultation. Respiratory effort normal. Cardiovascular system: S1 & S2 heard, RRR. No JVD, murmurs, rubs, gallops or clicks. No pedal edema. Gastrointestinal system: Abdomen is nondistended, soft and nontender. No organomegaly or masses felt. Normal bowel sounds heard. Central nervous system: Alert and oriented. No focal neurological deficits. Extremities: Symmetric 5 x 5 power. Skin: No rashes, lesions or ulcers    Data Reviewed: I have personally reviewed following labs and imaging studies  CBC: Recent Labs  Lab 05/10/19 2013 05/12/19 1537 05/13/19 1132  WBC 9.4 10.1 9.5  NEUTROABS 7.5 8.3*  --   HGB 13.3 14.0 12.5*  HCT 39.9 41.3 36.3*  MCV 100.8* 98.8 97.1  PLT 129* 112* [id number]*   Basic Metabolic Panel: Recent Labs  Lab 05/10/19 2013 05/12/19 1537  NA 138 138  K 4.4 4.8  CL 102 97*  CO2 27 29  GLUCOSE 463* 434*  BUN 38* 39*  CREATININE 0.98 0.99  CALCIUM 9.0 9.3   GFR: Estimated Creatinine Clearance: 68.6 mL/min (by C-G formula based on SCr of 0.99 mg/dL). Liver Function Tests: Recent Labs  Lab 05/10/19 2013 05/12/19 1537  AST 41 48*  ALT 83* 86*  ALKPHOS 43 46  BILITOT 0.6 1.1  PROT 5.5* 5.7*  ALBUMIN 3.0* 2.9*   No results for input(s): LIPASE, AMYLASE in the last 168 hours. No results for input(s): AMMONIA in the last 168 hours. Coagulation Profile: No results for input(s): INR, PROTIME in the last 168 hours. Cardiac Enzymes: No results for input(s): CKTOTAL, CKMB, CKMBINDEX, TROPONINI in the last 168  hours. BNP (last 3 results) No results for input(s): PROBNP in the last 8760 hours. HbA1C: Recent Labs    05/13/19 1132  HGBA1C 8.8*   CBG: Recent Labs  Lab 05/13/19 1138 05/13/19 1644 05/13/19 2123 05/14/19 0803 05/14/19 1245  GLUCAP 351* 270* 204* 189* 165*   Lipid Profile: No results for input(s): CHOL, HDL, LDLCALC, TRIG, CHOLHDL, LDLDIRECT in the last 72 hours. Thyroid Function Tests: No results for input(s): TSH, T4TOTAL, FREET4, T3FREE, THYROIDAB in the last 72 hours. Anemia Panel: No results for input(s): VITAMINB12, FOLATE, FERRITIN, TIBC, IRON, RETICCTPCT in the last 72 hours. Sepsis Labs: Recent Labs  Lab 05/12/19 1537 05/12/19 1915  LATICACIDVEN 2.9* 2.2*    Recent Results (from the past 240 hour(s))  SARS CORONAVIRUS 2 (TAT 6-24 HRS) Nasopharyngeal Nasopharyngeal Swab     Status: None   Collection Time: 05/12/19  6:43 PM   Specimen: Nasopharyngeal Swab  Result Value Ref Range Status   SARS Coronavirus 2 NEGATIVE NEGATIVE Final    Comment: (NOTE) SARS-CoV-2 target nucleic acids are NOT DETECTED. The SARS-CoV-2 RNA is generally detectable in upper and lower respiratory specimens during  the acute phase of infection. Negative results do not preclude SARS-CoV-2 infection, do not rule out co-infections with other pathogens, and should not be used as the sole basis for treatment or other patient management decisions. Negative results must be combined with clinical observations, patient history, and epidemiological information. The expected result is Negative. Fact Sheet for Patients: SugarRoll.be Fact Sheet for Healthcare Providers: https://www.woods-mathews.com/ This test is not yet approved or cleared by the [place] FDA and  has been authorized for detection and/or diagnosis of SARS-CoV-2 by FDA under an Emergency Use Authorization (EUA). This EUA will remain  in effect (meaning this test can be used) for  the duration of the COVID-19 declaration under Section 56 4(b)(1) of the Act, 21 U.S.C. section 360bbb-3(b)(1), unless the authorization is terminated or revoked sooner. Performed at [facility], [place] [address], [place], [place] [address]          Radiology Studies: Dg Chest 2 View  Result Date: 05/12/2019 CLINICAL DATA:  Weakness, lower extremity edema, history of metastatic cancer EXAM: CHEST - 2 VIEW COMPARISON:  03/25/2019 FINDINGS: The heart size and mediastinal contours are within normal limits. Unchanged elevation of the left hemidiaphragm without acute appearing airspace opacity. Disc degenerative disease and ankylosis of the thoracic spine. IMPRESSION: Unchanged elevation of the left hemidiaphragm without acute appearing airspace opacity. Electronically Signed   By: Eddie Candle M.D.   On: 05/12/2019 16:59   Ct Head Wo Contrast  Result Date: 05/12/2019 CLINICAL DATA:  Hypoglycemia, pedal edema, history hypertension, melanoma EXAM: CT HEAD WITHOUT CONTRAST TECHNIQUE: Contiguous axial images were obtained from the base of the skull through the vertex without intravenous contrast. Sagittal and coronal MPR images reconstructed from axial data set. COMPARISON:  CT head 03/27/2019 FINDINGS: Brain: Generalized atrophy. Mild ex vacuo dilatation of the ventricular system. Again identified hyperdense focus at the posterior RIGHT parieto-occipital lobe 2.5 x 2.3 cm consistent with intraparenchymal hemorrhage, decreased in size. Persistent residual surrounding edema. Smaller LEFT except a hemorrhage seen on the previous exam has decreased in size as well, now 1.8 x 1.3 cm and demonstrating a fluid/fluid level. Decreased attenuation and size of previously seen LEFT temporal lobe hemorrhage, now 10 x 9 mm. Small vessel chronic ischemic changes of deep cerebral white matter. No new areas of intracranial hemorrhage, mass lesion or infarction. No extra-axial fluid collections. Vascular:  Atherosclerotic calcifications of internal carotid arteries at skull base. Skull: Intact Sinuses/Orbits: Clear Other: N/A IMPRESSION: Atrophy with small vessel chronic ischemic changes of deep cerebral white matter. Interval decrease in sizes of previously seen intraparenchymal hemorrhages in both hemispheres at sites of known hemorrhagic metastases. No new intracranial abnormalities. Electronically Signed   By: Lavonia Dana M.D.   On: 05/12/2019 17:13   Ct Angio Chest Pe W/cm &/or Wo Cm  Result Date: 05/12/2019 CLINICAL DATA:  Bilateral lower extremity swelling. EXAM: CT ANGIOGRAPHY CHEST WITH CONTRAST TECHNIQUE: Multidetector CT imaging of the chest was performed using the standard protocol during bolus administration of intravenous contrast. Multiplanar CT image reconstructions and MIPs were obtained to evaluate the vascular anatomy. CONTRAST:  161mL OMNIPAQUE IOHEXOL 350 MG/ML SOLN COMPARISON:  None. FINDINGS: Cardiovascular: Small filling defects are noted in upper and lower lobe branches of the right pulmonary artery. RV/LV ratio of 1.0 is noted. Normal cardiac size. No pericardial effusion. Atherosclerosis of thoracic aorta is noted without aneurysm or dissection. Mediastinum/Nodes: Thyroid gland and esophagus are unremarkable. Extensive mediastinal adenopathy is noted. This includes 3 cm subcarinal adenopathy, 2.3  cm prevascular adenopathy, 2.3 cm left paratracheal adenopathy and 12 mm right hilar lymph node. 17 mm left supraclavicular lymph node is noted. Lungs/Pleura: No pneumothorax is noted. Left upper lobe atelectasis or infiltrate is noted. 36 x 28 mm probable necrotic mass is noted in the right posterior costophrenic sulcus. Upper Abdomen: 16 mm right adrenal nodule is noted. Musculoskeletal: No chest wall abnormality. No acute or significant osseous findings. Review of the MIP images confirms the above findings. IMPRESSION: Small filling defects are noted in upper and lower lobe branches of right  pulmonary artery consistent with small pulmonary emboli. Positive for acute PE with CT evidence of right heart strain (RV/LV Ratio = 1.0) consistent with at least submassive (intermediate risk) PE. The presence of right heart strain has been associated with an increased risk of morbidity and mortality. Please activate Code PE by paging [phone]. 3.6 x 2.8 cm probable necrotic mass is noted in the right posterior costophrenic sulcus of the right lung concerning for malignancy. Right hilar, left supraclavicular, left paratracheal, subcarinal and prevascular adenopathy is noted concerning for metastatic disease. Critical Value/emergent results were called by telephone at the time of interpretation on 05/12/2019 at 7:02 pm to providerRACHEL LITTLE , who verbally acknowledged these results. Mild left upper lobe atelectasis or infiltrate is noted. 16 mm right adrenal nodule is noted. MRI is recommended to rule out metastatic disease. Aortic Atherosclerosis (ICD10-I70.0). Electronically Signed   By: Marijo Conception M.D.   On: 05/12/2019 19:02   Ir Ivc Filter Plmt / S&i /img Guid/mod Sed  Result Date: 05/14/2019 INDICATION: 78 year old male with a history of metastatic melanoma including hemorrhagic brain metastases. He has new acute DVT in the left lower extremity. His history of hemorrhagic brain Mets represents an absolute contraindication to anticoagulation. Therefore, he is a candidate for caval interruption as a form of PE prophylaxis. EXAM: ULTRASOUND GUIDANCE FOR VASCULARACCESS IVC CATHETERIZATION AND VENOGRAM IVC FILTER INSERTION Interventional Radiologist:  Criselda Peaches, MD MEDICATIONS: None. ANESTHESIA/SEDATION: Fentanyl 75 mcg IV; Versed 1.5 mg IV Moderate Sedation Time:  13 minutes The patient was continuously monitored during the procedure by the interventional radiology nurse under my direct supervision. FLUOROSCOPY TIME:  Fluoroscopy Time: 0 minutes 48 seconds (52 mGy). COMPLICATIONS: None  immediate. PROCEDURE: Informed written consent was obtained from the patient after a thorough discussion of the procedural risks, benefits and alternatives. All questions were addressed. Maximal Sterile Barrier Technique was utilized including caps, mask, sterile gowns, sterile gloves, sterile drape, hand hygiene and skin antiseptic. A timeout was performed prior to the initiation of the procedure. Maximal barrier sterile technique utilized including caps, mask, sterile gowns, sterile gloves, large sterile drape, hand hygiene, and Betadine prep. Under sterile condition and local anesthesia, right internal jugular venous access was performed with ultrasound. An ultrasound image was saved and sent to PACS. Over a guidewire, the IVC filter delivery sheath and inner dilator were advanced into the IVC just above the IVC bifurcation. Contrast injection was performed for an IVC venogram. Through the delivery sheath, a retrievable Denali IVC filter was deployed below the level of the renal veins and above the IVC bifurcation. Limited post deployment venacavagram was performed. The delivery sheath was removed and hemostasis was obtained with manual compression. A dressing was placed. The patient tolerated the procedure well without immediate post procedural complication. FINDINGS: The IVC is patent. No evidence of thrombus, stenosis, or occlusion. No variant venous anatomy. Successful placement of the IVC filter below the level of the renal veins.  IMPRESSION: Successful ultrasound and fluoroscopically guided placement of an infrarenal retrievable IVC filter via right jugular approach. PLAN: Due to patient related comorbidities and/or clinical necessity, this IVC filter should be considered a permanent device. This patient will not be actively followed for future filter retrieval. Electronically Signed   By: Jacqulynn Cadet M.D.   On: 05/14/2019 12:42   Vas [place] Lower Extremity Venous (dvt) (mc And Wl 7a-7p)  Result Date:  05/13/2019  Lower Venous Study Indications: Swelling.  Comparison Study: No prior study. Performing Technologist: Maudry Mayhew MHA, RDMS, RVT, RDCS  Examination Guidelines: A complete evaluation includes B-mode imaging, spectral Doppler, color Doppler, and power Doppler as needed of all accessible portions of each vessel. Bilateral testing is considered an integral part of a complete examination. Limited examinations for reoccurring indications may be performed as noted.  +---------+---------------+---------+-----------+----------+--------------+  RIGHT     Compressibility Phasicity Spontaneity Properties Thrombus Aging  +---------+---------------+---------+-----------+----------+--------------+  CFV       Full            Yes       Yes                                    +---------+---------------+---------+-----------+----------+--------------+  SFJ       Full                                                             +---------+---------------+---------+-----------+----------+--------------+  FV Prox   Full                                                             +---------+---------------+---------+-----------+----------+--------------+  FV Mid    Full                                                             +---------+---------------+---------+-----------+----------+--------------+  FV Distal Full                                                             +---------+---------------+---------+-----------+----------+--------------+  PFV       Full                                                             +---------+---------------+---------+-----------+----------+--------------+  POP       Full            Yes       Yes                                    +---------+---------------+---------+-----------+----------+--------------+  PTV       Full                                                             +---------+---------------+---------+-----------+----------+--------------+  PERO      Full                                                              +---------+---------------+---------+-----------+----------+--------------+  +---------+---------------+---------+-----------+----------+-----------------+  LEFT      Compressibility Phasicity Spontaneity Properties Thrombus Aging     +---------+---------------+---------+-----------+----------+-----------------+  CFV       Full            Yes       Yes                                       +---------+---------------+---------+-----------+----------+-----------------+  SFJ       Full                                                                +---------+---------------+---------+-----------+----------+-----------------+  FV Prox   Full                                                                +---------+---------------+---------+-----------+----------+-----------------+  FV Mid    None                      No                     Acute              +---------+---------------+---------+-----------+----------+-----------------+  FV Distal None                      No                     Acute              +---------+---------------+---------+-----------+----------+-----------------+  PFV       Full                                                                +---------+---------------+---------+-----------+----------+-----------------+  POP       Partial                   Yes  Age Indeterminate  +---------+---------------+---------+-----------+----------+-----------------+  PTV       None                      No                     Acute              +---------+---------------+---------+-----------+----------+-----------------+  PERO      Full                                                                +---------+---------------+---------+-----------+----------+-----------------+  Summary: Right: There is no evidence of deep vein thrombosis in the lower extremity. No cystic structure found in the popliteal fossa. Left: Findings  consistent with acute deep vein thrombosis involving the left femoral vein, and left posterior tibial veins. Findings consistent with age indeterminate deep vein thrombosis involving the left popliteal vein. No cystic structure found in the popliteal fossa.  *See table(s) above for measurements and observations. Electronically signed by Ruta Hinds MD on 05/13/2019 at 11:16:04 AM.    Final         Scheduled Meds:  amLODipine  5 mg Oral Daily   dexamethasone  4 mg Oral BID   fluconazole  100 mg Oral Daily   insulin aspart  0-5 Units Subcutaneous QHS   insulin aspart  0-9 Units Subcutaneous TID WC   insulin glargine  10 Units Subcutaneous Daily   polyethylene glycol  17 g Oral BID   propranolol  40 mg Oral BID   sodium chloride flush  3 mL Intravenous Q12H   Continuous Infusions:  azithromycin 500 mg (05/13/19 2209)   cefTRIAXone (ROCEPHIN)  IV 2 g (05/13/19 2128)     LOS: 2 days    Time spent: 35 minutes.     Elmarie Shiley, MD [facility] Pager [phone]  If 7PM-7AM, please contact night-coverage [email or web address] Password [facility] 05/14/2019, 3:18 PM

## 2019-05-14 NOTE — Progress Notes (Signed)
PT Cancellation Note  Patient Details Name: Randall Mcgee MRN: [id number] DOB: January 03, 1941   Cancelled Treatment:    Reason Eval/Treat Not Completed: Patient at procedure or test/unavailable   Off the floor at Interventional Radiology;   Will follow up later today as time allows;  Otherwise, will follow up for PT tomorrow;   Thank you,  [name], [place] Pager [phone] Office [phone]     [name] 05/14/2019, 11:19 AM

## 2019-05-15 ENCOUNTER — Ambulatory Visit (HOSPITAL_COMMUNITY): Payer: Medicare Other

## 2019-05-15 DIAGNOSIS — Z515 Encounter for palliative care: Secondary | ICD-10-CM

## 2019-05-15 DIAGNOSIS — Z7189 Other specified counseling: Secondary | ICD-10-CM

## 2019-05-15 DIAGNOSIS — Z66 Do not resuscitate: Secondary | ICD-10-CM

## 2019-05-15 LAB — BASIC METABOLIC PANEL
Anion gap: 8 (ref 5–15)
BUN: 22 mg/dL (ref 8–23)
CO2: 28 mmol/L (ref 22–32)
Calcium: 8.7 mg/dL — ABNORMAL LOW (ref 8.9–10.3)
Chloride: 103 mmol/L (ref 98–111)
Creatinine, Ser: 0.84 mg/dL (ref 0.61–1.24)
GFR calc Af Amer: >60 mL/min (ref >60)
GFR calc non Af Amer: >60 mL/min (ref >60)
Glucose, Bld: 265 mg/dL — ABNORMAL HIGH (ref 70–99)
Potassium: 4.3 mmol/L (ref 3.5–5.1)
Sodium: 139 mmol/L (ref 135–145)

## 2019-05-15 LAB — CBC
HCT: 38.5 % — ABNORMAL LOW (ref 39.0–52.0)
Hemoglobin: 13.5 g/dL (ref 13.0–17.0)
MCH: 33.6 pg (ref 26.0–34.0)
MCHC: 35.1 g/dL (ref 30.0–36.0)
MCV: 95.8 fL (ref 80.0–100.0)
Platelets: 103 10*3/uL — ABNORMAL LOW (ref 150–400)
RBC: 4.02 MIL/uL — ABNORMAL LOW (ref 4.22–5.81)
RDW: 13.3 % (ref 11.5–15.5)
WBC: 9.1 10*3/uL (ref 4.0–10.5)
nRBC: 0.2 % (ref 0.0–0.2)

## 2019-05-15 LAB — MAGNESIUM: Magnesium: 1.9 mg/dL (ref 1.7–2.4)

## 2019-05-15 LAB — GLUCOSE, CAPILLARY
Glucose-Capillary: 256 mg/dL — ABNORMAL HIGH (ref 70–99)
Glucose-Capillary: 335 mg/dL — ABNORMAL HIGH (ref 70–99)
Glucose-Capillary: 273 mg/dL — ABNORMAL HIGH (ref 70–99)
Glucose-Capillary: 283 mg/dL — ABNORMAL HIGH (ref 70–99)

## 2019-05-15 MED ORDER — INSULIN GLARGINE 100 UNIT/ML ~~LOC~~ SOLN
17.0000 [IU] | Freq: Every day | SUBCUTANEOUS | Status: DC
  Administered 2019-05-15: 17 [IU] via SUBCUTANEOUS
  Filled 2019-05-15: qty 0.17

## 2019-05-15 MED ORDER — INSULIN ASPART 100 UNIT/ML ~~LOC~~ SOLN
2.0000 [IU] | Freq: Three times a day (TID) | SUBCUTANEOUS | Status: DC
  Administered 2019-05-15: 2 [IU] via SUBCUTANEOUS

## 2019-05-15 MED ORDER — INSULIN ASPART 100 UNIT/ML ~~LOC~~ SOLN
4.0000 [IU] | Freq: Three times a day (TID) | SUBCUTANEOUS | Status: DC
  Administered 2019-05-15 – 2019-05-17 (×6): 4 [IU] via SUBCUTANEOUS

## 2019-05-15 MED ORDER — INSULIN GLARGINE 100 UNIT/ML ~~LOC~~ SOLN
20.0000 [IU] | Freq: Every day | SUBCUTANEOUS | Status: DC
  Administered 2019-05-16: 20 [IU] via SUBCUTANEOUS
  Filled 2019-05-15 (×2): qty 0.2

## 2019-05-15 NOTE — Progress Notes (Signed)
PROGRESS NOTE    Randall Mcgee  [id number] DOB: 02-03-1941 DOA: 05/12/2019 PCP: Isaac Bliss, Rayford Halsted, MD   Brief Narrative: 78 year old with past medical history significant for metastatic melanoma with hemorrhagic brain metastases and hypertension now presenting to the emergency department with leg swelling and hyperglycemia on outpatient blood work.  Patient had melanoma resected in 2017, was found to have hemorrhagic brain metastases last month, was treated with radiation and immunotherapy has been taking Decadron and was directed to the ED for evaluation of hyperglycemia on outpatient blood work.  Patient complaining of a swelling to bilateral feet.  Patient reports recent bilateral leg swelling.  He was seen in the emergency department a couple of days ago for these, was given Lasix and reports improvement.  He also had trouble recently with hyperglycemia, likely secondary to Decadron and reports having outpatient blood work that was concerning for hyperglycemia.  Patient denies any chest pain.  He has been short of breath but has not noted any acute changes in this. Evaluation in the ED patient was found to be afebrile, satting in the upper 80s and low 90s.  EKG sinus rhythm with left bundle branch block.  CBC with history of thrombocytopenia, lactic acid 2.9.  CTA chest is concerning for pulmonary emboli right pulmonary artery with possible right heart strain, possible necrotic mass in the right and possible pneumonia in the left upper lobe.     Assessment & Plan:   Principal Problem:   Acute pulmonary embolism (HCC) Active Problems:   Hypertension   Malignant melanoma of scalp (HCC)   ICH (intracerebral hemorrhage) (HCC)   Brain metastases (HCC)   Left leg DVT (HCC)   Mass of right lung   Right adrenal mass (HCC)   Uncontrolled diabetes mellitus with hyperglycemia (HCC)  1-acute PE, left lower extremity DVT: -Patient presented with acute PE and lower extremity DVT.   Unable to be a started on anticoagulation due to hemorrhagic brain metastasis and risk for worsening intracerebral bleed. -Case was discussed with neurology, neurosurgery and oncology all who agree the patient is too high risk for blood thinner due to hemorrhagic brain metastasis. -Patient underwent IVC filter placement on 10/21st. -Palliative  has been consulted for further goals of care. -Echo with normal right ventricular heart strain  2-Metastatic melanoma, hemorrhagic brain metastasis; Side history of metastatic melanoma status post an outpatient surgery in 2017, found to have brain and lung lesions recently underwent radiation and immunotherapy. CT head during this admission showed decrease in size of hemorrhagic brain mets CT chest showed necrotic mass involving the right lung. On Decadron.  Oncology following.  3-Hyperglycemia related to steroids Continue with Lantus.  Continue with a sliding scale insulin. Increase lantus today to 17 units. From tomorrow increase to 20 units. Will add meals coverage.   Hypertension: On Norvasc  Anxiety: Xanax as needed  Pneumonia: Continue with IV antibiotics. Confusion: This could be multifactorial, related to pneumonia infection, prior brain metastasis, hyperglycemia.     Estimated body mass index is 26.92 kg/m as calculated from the following:   Height as of this encounter: 5\' 10"  (1.778 m).   Weight as of this encounter: 85.1 kg.   DVT prophylaxis: TED hose Code Status: patient wishes to be DNR, son agrees Family Communication: Son at bedside Disposition Plan: Remain in the hospital for treatment of pneumonia and need for further discussion of goals of care Consultants:   Oncology  IR  Neurology  Procedures:   IVC placement 10/21  Antimicrobials:  Ceftriaxone  Subjective: He denies dyspnea. LE edema is not worse.   Objective: Vitals:   05/14/19 2200 05/15/19 0500 05/15/19 0521 05/15/19 0847  BP: (!) 136/59  126/76  131/78  Pulse: 87  75 72  Resp:      Temp: 98 F (36.7 C)  98.6 F (37 C)   TempSrc: Oral  Oral   SpO2: 90%  90%   Weight:  85.1 kg    Height:        Intake/Output Summary (Last 24 hours) at 05/15/2019 1012 Last data filed at 05/15/2019 0200 Gross per 24 hour  Intake 830 ml  Output 800 ml  Net 30 ml   Filed Weights   05/12/19 1511 05/15/19 0500  Weight: 94.3 kg 85.1 kg    Examination:  General exam: NAD Respiratory system: CTA Cardiovascular system: S 1, S 2 RRR Gastrointestinal system: BS present, soft, nt Central nervous system: alert, following command Extremities: Symmetric power Skin: No rashes, lesions or ulcers    Data Reviewed: I have personally reviewed following labs and imaging studies  CBC: Recent Labs  Lab 05/10/19 2013 05/12/19 1537 05/13/19 1132  WBC 9.4 10.1 9.5  NEUTROABS 7.5 8.3*  --   HGB 13.3 14.0 12.5*  HCT 39.9 41.3 36.3*  MCV 100.8* 98.8 97.1  PLT 129* 112* [id number]*   Basic Metabolic Panel: Recent Labs  Lab 05/10/19 2013 05/12/19 1537 05/15/19 0832  NA 138 138 139  K 4.4 4.8 4.3  CL 102 97* 103  CO2 27 29 28   GLUCOSE 463* 434* 265*  BUN 38* 39* 22  CREATININE 0.98 0.99 0.84  CALCIUM 9.0 9.3 8.7*  MG  --   --  1.9   GFR: Estimated Creatinine Clearance: 72.4 mL/min (by C-G formula based on SCr of 0.84 mg/dL). Liver Function Tests: Recent Labs  Lab 05/10/19 2013 05/12/19 1537  AST 41 48*  ALT 83* 86*  ALKPHOS 43 46  BILITOT 0.6 1.1  PROT 5.5* 5.7*  ALBUMIN 3.0* 2.9*   No results for input(s): LIPASE, AMYLASE in the last 168 hours. No results for input(s): AMMONIA in the last 168 hours. Coagulation Profile: No results for input(s): INR, PROTIME in the last 168 hours. Cardiac Enzymes: No results for input(s): CKTOTAL, CKMB, CKMBINDEX, TROPONINI in the last 168 hours. BNP (last 3 results) No results for input(s): PROBNP in the last 8760 hours. HbA1C: Recent Labs    05/13/19 1132  HGBA1C 8.8*   CBG: Recent  Labs  Lab 05/14/19 0803 05/14/19 1245 05/14/19 1717 05/14/19 2201 05/15/19 0743  GLUCAP 189* 165* 330* 368* 256*   Lipid Profile: No results for input(s): CHOL, HDL, LDLCALC, TRIG, CHOLHDL, LDLDIRECT in the last 72 hours. Thyroid Function Tests: No results for input(s): TSH, T4TOTAL, FREET4, T3FREE, THYROIDAB in the last 72 hours. Anemia Panel: No results for input(s): VITAMINB12, FOLATE, FERRITIN, TIBC, IRON, RETICCTPCT in the last 72 hours. Sepsis Labs: Recent Labs  Lab 05/12/19 1537 05/12/19 1915  LATICACIDVEN 2.9* 2.2*    Recent Results (from the past 240 hour(s))  SARS CORONAVIRUS 2 (TAT 6-24 HRS) Nasopharyngeal Nasopharyngeal Swab     Status: None   Collection Time: 05/12/19  6:43 PM   Specimen: Nasopharyngeal Swab  Result Value Ref Range Status   SARS Coronavirus 2 NEGATIVE NEGATIVE Final    Comment: (NOTE) SARS-CoV-2 target nucleic acids are NOT DETECTED. The SARS-CoV-2 RNA is generally detectable in upper and lower respiratory specimens during the acute phase of infection. Negative results do  not preclude SARS-CoV-2 infection, do not rule out co-infections with other pathogens, and should not be used as the sole basis for treatment or other patient management decisions. Negative results must be combined with clinical observations, patient history, and epidemiological information. The expected result is Negative. Fact Sheet for Patients: SugarRoll.be Fact Sheet for Healthcare Providers: https://www.woods-mathews.com/ This test is not yet approved or cleared by the [place] FDA and  has been authorized for detection and/or diagnosis of SARS-CoV-2 by FDA under an Emergency Use Authorization (EUA). This EUA will remain  in effect (meaning this test can be used) for the duration of the COVID-19 declaration under Section 56 4(b)(1) of the Act, 21 U.S.C. section 360bbb-3(b)(1), unless the authorization is terminated  or revoked sooner. Performed at [facility], [place] [address], [place], [place] [address]          Radiology Studies: Ir Ivc Filter Plmt / S&i /img Guid/mod Sed  Result Date: 05/14/2019 INDICATION: 78 year old male with a history of metastatic melanoma including hemorrhagic brain metastases. He has new acute DVT in the left lower extremity. His history of hemorrhagic brain Mets represents an absolute contraindication to anticoagulation. Therefore, he is a candidate for caval interruption as a form of PE prophylaxis. EXAM: ULTRASOUND GUIDANCE FOR VASCULARACCESS IVC CATHETERIZATION AND VENOGRAM IVC FILTER INSERTION Interventional Radiologist:  Criselda Peaches, MD MEDICATIONS: None. ANESTHESIA/SEDATION: Fentanyl 75 mcg IV; Versed 1.5 mg IV Moderate Sedation Time:  13 minutes The patient was continuously monitored during the procedure by the interventional radiology nurse under my direct supervision. FLUOROSCOPY TIME:  Fluoroscopy Time: 0 minutes 48 seconds (52 mGy). COMPLICATIONS: None immediate. PROCEDURE: Informed written consent was obtained from the patient after a thorough discussion of the procedural risks, benefits and alternatives. All questions were addressed. Maximal Sterile Barrier Technique was utilized including caps, mask, sterile gowns, sterile gloves, sterile drape, hand hygiene and skin antiseptic. A timeout was performed prior to the initiation of the procedure. Maximal barrier sterile technique utilized including caps, mask, sterile gowns, sterile gloves, large sterile drape, hand hygiene, and Betadine prep. Under sterile condition and local anesthesia, right internal jugular venous access was performed with ultrasound. An ultrasound image was saved and sent to PACS. Over a guidewire, the IVC filter delivery sheath and inner dilator were advanced into the IVC just above the IVC bifurcation. Contrast injection was performed for an IVC venogram. Through the delivery sheath, a  retrievable Denali IVC filter was deployed below the level of the renal veins and above the IVC bifurcation. Limited post deployment venacavagram was performed. The delivery sheath was removed and hemostasis was obtained with manual compression. A dressing was placed. The patient tolerated the procedure well without immediate post procedural complication. FINDINGS: The IVC is patent. No evidence of thrombus, stenosis, or occlusion. No variant venous anatomy. Successful placement of the IVC filter below the level of the renal veins. IMPRESSION: Successful ultrasound and fluoroscopically guided placement of an infrarenal retrievable IVC filter via right jugular approach. PLAN: Due to patient related comorbidities and/or clinical necessity, this IVC filter should be considered a permanent device. This patient will not be actively followed for future filter retrieval. Electronically Signed   By: Jacqulynn Cadet M.D.   On: 05/14/2019 12:42        Scheduled Meds:  amLODipine  5 mg Oral Daily   dexamethasone  4 mg Oral BID   fluconazole  100 mg Oral Daily   insulin aspart  0-5 Units Subcutaneous QHS   insulin  aspart  0-9 Units Subcutaneous TID WC   insulin aspart  2 Units Subcutaneous TID WC   insulin glargine  17 Units Subcutaneous Daily   polyethylene glycol  17 g Oral BID   propranolol  40 mg Oral BID   sodium chloride flush  3 mL Intravenous Q12H   Continuous Infusions:  azithromycin 500 mg (05/14/19 2057)   cefTRIAXone (ROCEPHIN)  IV 2 g (05/14/19 1941)     LOS: 3 days    Time spent: 35 minutes.     Elmarie Shiley, MD [facility] Pager [phone]  If 7PM-7AM, please contact night-coverage [email or web address] Password [email or web address] 05/15/2019, 10:12 AM

## 2019-05-15 NOTE — TOC Progression Note (Addendum)
Transition of Care [facility]) - Progression Note    Patient Details  Name: Randall Mcgee MRN: [id number] Date of Birth: Oct 27, 1940  Transition of Care [facility]) CM/SW Hilda, Francis Phone Number: 05/15/2019, 3:28 PM  Clinical Narrative:    CSW spoke with patient and patient's son Randall Mcgee at the bedside regarding SNF placement. The following places offered beds:  [facility]  Patient stated that he would like to go to [facility] due to his wife already being there. Rob is in agreement. NP Baltazar Najjar requesting palliative care consult once patient has discharged to SNF. CSW to inform admissions coordinator at [facility].   Patient will discharge to [facility] when medically ready.   Clinicals have been faxed to [facility] to start insurance authorization [phone]  Expected Discharge Plan: [place] Barriers to Discharge: Continued Medical Work up  Expected Discharge Plan and Services Expected Discharge Plan: [place] In-house Referral: Clinical Social Work Discharge Planning Services: CM Consult   Living arrangements for the past 2 months: Single Family Home                 DME Arranged: N/A DME Agency: NA       HH Arranged: NA HH Agency: NA         Social Determinants of Health (SDOH) Interventions    Readmission Risk Interventions No flowsheet data found.

## 2019-05-15 NOTE — Evaluation (Signed)
Physical Therapy Evaluation Patient Details Name: SHAWNDELL Mcgee MRN: [id number] DOB: 04-Apr-1941 Today's Date: 05/15/2019   History of Present Illness  Pt is a 78 y.o. M who presents with hyperglycemia and bilateral leg swelling, found to have acute pulmonary embolism and LLE DVT. Significant PMH: metastatic melanoma with hemorrhagic brain metastases and hypertension. S/p IVC filter placement 10/21   Clinical Impression  Pt admitted with above. Pt presents with decreased functional mobility secondary to generalized weakness, balance impairments, and decreased endurance. On PT evaluation, pt ambulating limited hallway distances with walker at a min assist level. Prior to admission, pt reports he has had increasing difficulty performing ADL's and requires assist. Pt and pt family would like to pursue post acute rehabilitation and this is appropriate considering pt deficits in order to maximize functional mobility and decrease caregiver burden.      Follow Up Recommendations SNF;Supervision/Assistance - 24 hour    Equipment Recommendations  None recommended by PT    Recommendations for Other Services       Precautions / Restrictions Precautions Precautions: Fall Restrictions Weight Bearing Restrictions: No      Mobility  Bed Mobility Overal bed mobility: Needs Assistance Bed Mobility: Supine to Sit     Supine to sit: Supervision        Transfers Overall transfer level: Needs assistance Equipment used: Rolling walker (2 wheeled) Transfers: Sit to/from Stand Sit to Stand: Min assist         General transfer comment: MinA to boost to stand from edge of bed  Ambulation/Gait Ambulation/Gait assistance: Min assist Gait Distance (Feet): 120 Feet Assistive device: Rolling walker (2 wheeled) Gait Pattern/deviations: Step-through pattern;Trunk flexed;Wide base of support;Decreased stride length Gait velocity: decreased   General Gait Details: Increased postural sway,  requiring min assist for stability.  Stairs            Wheelchair Mobility    Modified Rankin (Stroke Patients Only)       Balance Overall balance assessment: Needs assistance Sitting-balance support: Feet supported Sitting balance-Leahy Scale: Good     Standing balance support: Bilateral upper extremity supported Standing balance-Leahy Scale: Poor Standing balance comment: reliant on external support                             Pertinent Vitals/Pain Pain Assessment: Faces Faces Pain Scale: No hurt    Home Living Family/patient expects to be discharged to:: Skilled nursing facility                      Prior Function Level of Independence: Needs assistance   Gait / Transfers Assistance Needed: uses walker  ADL's / Homemaking Assistance Needed: requires assist with all ADL's         Hand Dominance   Dominant Hand: Right    Extremity/Trunk Assessment   Upper Extremity Assessment Upper Extremity Assessment: Generalized weakness    Lower Extremity Assessment Lower Extremity Assessment: Generalized weakness       Communication   Communication: No difficulties  Cognition Arousal/Alertness: Awake/alert Behavior During Therapy: WFL for tasks assessed/performed Overall Cognitive Status: Impaired/Different from baseline Area of Impairment: Memory;Orientation                 Orientation Level: Disoriented to;Situation;Time   Memory: Decreased short-term memory         General Comments: Pt not oriented to month, stating it was November. Somewhat unclear/poor historian in regards to PLOF. Pt  reporting he gets confused easily. overall, following all commands.       General Comments      Exercises     Assessment/Plan    PT Assessment Patient needs continued PT services  PT Problem List Decreased strength;Decreased activity tolerance;Decreased balance;Decreased mobility;Decreased cognition       PT Treatment  Interventions DME instruction;Gait training;Functional mobility training;Therapeutic activities;Therapeutic exercise;Balance training;Patient/family education    PT Goals (Current goals can be found in the Care Plan section)  Acute Rehab PT Goals Patient Stated Goal: pt and pt family would like to go to rehab PT Goal Formulation: With patient/family Time For Goal Achievement: 05/29/19 Potential to Achieve Goals: Fair    Frequency Min 2X/week   Barriers to discharge Decreased caregiver support      Co-evaluation               AM-PAC PT "6 Clicks" Mobility  Outcome Measure Help needed turning from your back to your side while in a flat bed without using bedrails?: None Help needed moving from lying on your back to sitting on the side of a flat bed without using bedrails?: A Little Help needed moving to and from a bed to a chair (including a wheelchair)?: A Little Help needed standing up from a chair using your arms (e.g., wheelchair or bedside chair)?: A Little Help needed to walk in hospital room?: A Little Help needed climbing 3-5 steps with a railing? : A Lot 6 Click Score: 18    End of Session Equipment Utilized During Treatment: Gait belt Activity Tolerance: Patient tolerated treatment well Patient left: in chair;with call bell/phone within reach;with chair alarm set;with family/visitor present Nurse Communication: Mobility status PT Visit Diagnosis: Unsteadiness on feet (R26.81);Muscle weakness (generalized) (M62.81);Difficulty in walking, not elsewhere classified (R26.2)    Time: [id number] PT Time Calculation (min) (ACUTE ONLY): 17 min   Charges:   PT Evaluation $PT Eval Moderate Complexity: [place], [place], DPT Acute Rehabilitation Services Pager [phone] Office [phone]   Randall Mcgee 05/15/2019, 1:40 PM

## 2019-05-15 NOTE — Progress Notes (Signed)
PMT consult received and chart reviewed. Met with patient and son Codie Krogh) at bedside to discuss goals of care. MOST form completed. Patient's wishes include DNR/DNI, limited additional interventions, IVF/ABX if indicated, and NO feeding tube. Durable DNR completed. Patient interested in completing/notarizing AD packet with chaplain. Spiritual consult placed. Patient acknowledges he cannot go home following hospitalization. He is very weak and agrees to discharge to SNF for rehab. He is hopeful to gain strength and possibly consider immunotherapy if still an option. Time spent discussing guarded prognosis not only because of metastatic cancer but also PE/DVT and not a candidate for anticoagulation with hemorrhagic brain mets.   Full palliative note to follow.  NO CHARGE  Ihor Dow, [place], FNP-C Palliative Medicine Team  Phone: [phone] Fax: [phone]

## 2019-05-15 NOTE — Care Management Important Message (Signed)
Important Message  Patient Details  Name: Randall Mcgee MRN: [id number] Date of Birth: 06/09/1941   Medicare Important Message Given:  Yes     Orbie Pyo 05/15/2019, 3:05 PM

## 2019-05-15 NOTE — Evaluation (Signed)
Occupational Therapy Evaluation Patient Details Name: Randall Mcgee MRN: [id number] DOB: 1940/08/03 Today's Date: 05/15/2019    History of Present Illness Pt is a 78 y.o. M who presents with hyperglycemia and bilateral leg swelling, found to have acute pulmonary embolism and LLE DVT. Significant PMH: metastatic melanoma with hemorrhagic brain metastases and hypertension. S/p IVC filter placement 10/21    Clinical Impression   Pt PTA: Pt requiring intermittent assist for ADL and mobility with AD. Pt currently limited by fair activity tolerance, disorientation, poor problem solving and decreased side stepping. Pt unable to problem solve to figure out how to safely ambulate to chair with obstacles in the way. Pt walking backwards or side stepping instead of facing forward to turning around in front of chair. Pt performing UB ADL with set-upA and modA for LB ADL with activity tolerance. Pt would benefit from continued OT skilled services for energy conservation, ADL and increasing mobility. OT following acutely.    Follow Up Recommendations  SNF    Equipment Recommendations  None recommended by OT    Recommendations for Other Services       Precautions / Restrictions Precautions Precautions: Fall Restrictions Weight Bearing Restrictions: No      Mobility Bed Mobility Overal bed mobility: Needs Assistance Bed Mobility: Supine to Sit     Supine to sit: Supervision        Transfers Overall transfer level: Needs assistance Equipment used: Rolling walker (2 wheeled) Transfers: Sit to/from Stand Sit to Stand: Min assist         General transfer comment: MinA to boost to stand from edge of bed    Balance Overall balance assessment: Needs assistance Sitting-balance support: Feet supported Sitting balance-Leahy Scale: Good     Standing balance support: Bilateral upper extremity supported Standing balance-Leahy Scale: Poor Standing balance comment: reliant on external  support                           ADL either performed or assessed with clinical judgement   ADL Overall ADL's : Needs assistance/impaired Eating/Feeding: Supervision/ safety;Set up;Sitting   Grooming: Supervision/safety;Sitting;Standing;Cueing for safety   Upper Body Bathing: Set up;Sitting   Lower Body Bathing: Moderate assistance;Sitting/lateral leans;Sit to/from stand;Cueing for safety   Upper Body Dressing : Set up;Sitting   Lower Body Dressing: Moderate assistance;Cueing for safety;Sitting/lateral leans;Sit to/from stand   Toilet Transfer: Minimal assistance;Ambulation;Grab bars;Regular Toilet;RW   Toileting- Clothing Manipulation and Hygiene: Moderate assistance;Cueing for safety;Sitting/lateral lean;Sit to/from stand       Functional mobility during ADLs: Min guard;Minimal assistance;Cueing for safety;Cueing for sequencing;Rolling walker General ADL Comments: Pt performing UB ADL with set-upA and modA for LB ADL with activity tolerance.      Vision Baseline Vision/History: No visual deficits Patient Visual Report: No change from baseline Vision Assessment?: No apparent visual deficits     Perception     Praxis      Pertinent Vitals/Pain Pain Assessment: Faces Faces Pain Scale: No hurt     Hand Dominance Right   Extremity/Trunk Assessment Upper Extremity Assessment Upper Extremity Assessment: Generalized weakness   Lower Extremity Assessment Lower Extremity Assessment: Defer to PT evaluation;Generalized weakness   Cervical / Trunk Assessment Cervical / Trunk Assessment: Normal   Communication Communication Communication: No difficulties   Cognition Arousal/Alertness: Awake/alert Behavior During Therapy: WFL for tasks assessed/performed Overall Cognitive Status: Impaired/Different from baseline Area of Impairment: Memory;Orientation;Problem solving  Orientation Level: Disoriented to;Situation;Time   Memory:  Decreased short-term memory       Problem Solving: Slow processing;Requires verbal cues General Comments: Pt requiring cues for date and unsure as to why he is here. Pt unable to problem solve to figure out how to safely ambulate to chair with obstacles in the way. Pt walking backwards or side stepping instead of facing forward to turning around in front of chair.   General Comments  Pt reports "I'm done with radiation."    Exercises     Shoulder Instructions      Home Living Family/patient expects to be discharged to:: Skilled nursing facility                                        Prior Functioning/Environment Level of Independence: Needs assistance  Gait / Transfers Assistance Needed: uses walker ADL's / Homemaking Assistance Needed: requires intermittent assist with all ADLs/IADL.             OT Problem List: Decreased strength;Decreased activity tolerance;Impaired balance (sitting and/or standing);Decreased safety awareness      OT Treatment/Interventions: Self-care/ADL training;Therapeutic exercise;Energy conservation;Therapeutic activities;Patient/family education;Balance training    OT Goals(Current goals can be found in the care plan section) Acute Rehab OT Goals Patient Stated Goal: pt and pt family would like to go to rehab OT Goal Formulation: With patient Time For Goal Achievement: 05/29/19 Potential to Achieve Goals: Good ADL Goals Pt Will Perform Grooming: standing;with supervision Pt Will Perform Lower Body Dressing: with supervision;sit to/from stand Pt Will Transfer to Toilet: with supervision;ambulating Pt Will Perform Toileting - Clothing Manipulation and hygiene: with supervision;sit to/from stand Additional ADL Goal #1: Pt will increase to x10 mins of sustained activity tolerance with ADL with rest breaks as needed.  OT Frequency: Min 2X/week   Barriers to D/C:            Co-evaluation              AM-PAC OT "6 Clicks"  Daily Activity     Outcome Measure Help from another person eating meals?: None Help from another person taking care of personal grooming?: A Little Help from another person toileting, which includes using toliet, bedpan, or urinal?: A Lot Help from another person bathing (including washing, rinsing, drying)?: A Lot Help from another person to put on and taking off regular upper body clothing?: A Little Help from another person to put on and taking off regular lower body clothing?: A Lot 6 Click Score: 16   End of Session Equipment Utilized During Treatment: Gait belt;Rolling walker Nurse Communication: Mobility status  Activity Tolerance: Patient limited by fatigue Patient left: in chair;with call bell/phone within reach;with chair alarm set  OT Visit Diagnosis: Unsteadiness on feet (R26.81);Muscle weakness (generalized) (M62.81)                Time: [id number] OT Time Calculation (min): 15 min Charges:  OT General Charges $OT Visit: 1 Visit OT Evaluation $OT Eval Moderate Complexity: 1 Mod  Darryl Nestle) Marsa Aris OTR/L Acute Rehabilitation Services Pager: [phone] Office: [place] 05/15/2019, 5:04 PM

## 2019-05-15 NOTE — Consult Note (Signed)
Consultation Note Date: 05/15/19  Patient Name: Randall Mcgee  DOB: 03/30/1941  MRN: [id number]  Age / Sex: 78 y.o., male  PCP: Isaac Bliss, Rayford Halsted, MD Referring Physician: Elmarie Shiley, MD  Reason for Consultation: Establishing goals of care  HPI/Patient Profile: 78 y.o. male  with past medical history of metastatic melanoma with hemorrhagic brain metastases, HTN, HLD, anxiety admitted on 05/12/2019 with leg swelling and hyperglycemia. Patient had melanoma resected in 2017, was found to have hemorrhagic brain metastases last month s/p radiation and plans to start immunotherapy. Receiving decadron. In ED, CTA chest concerning for pulmonary emboli right pulmonary artery with possible right heart strain, possible necrotic mass in right lung, possible pneumonia in left upper lobe, and 33m right adrenal nodule noted. Attending has discussed case with neurology, neurosurgery, and oncology who agree that patient is not a candidate for anticoagulation due to hemorrhagic brain mets. IVC filter placed 10/21. ECHO normal with right ventricular heart strain. Palliative medicine consultation for goals of care.   Clinical Assessment and Goals of Care:  I have reviewed medical records, discussed with care team and met with patient and son (Joni Norrod to discuss diagnosis, prognosis, GOC, EOL wishes, disposition and options. Patient is awake, alert, oriented and able to participate in GMammothdiscussion. He is sitting in recliner and ate his entire tray of lunch. He appears comfortable without s/s of pain or shortness of breath.    Introduced Palliative Medicine as specialized medical care for people living with serious illness. It focuses on providing relief from the symptoms and stress of a serious illness. The goal is to improve quality of life for both the patient and the family.  We discussed a brief life review of  the patient. Patient is originally from [place] Moved down to [place] when he retired. His wife MJamas Lavis currently in rehab and receiving treatment for breast cancer. Son, [place] has been visiting frequently since his father's health decline with cancer progression and weakness. Patient is experiencing weakness requiring walker for ambulation. Appetite has been fair. He is experiencing left visual changes. Completed radiation and was starting the education to receive immunotherapy.   Discussed events leading up to admission and course of hospitalization including diagnoses, interventions, plan of care. Reviewed test rests and notes in detail with patient and son. Patient and son understand he is not a candidate for blood thinner due to hemorrhagic mets. Explained that PE's are likely from hypercoagulable state due to cancer and high risk for future blood clots.   Spent time discussing the incurable nature of his cancer and oncology notes. Any oncology interventions being palliative in nature, not curable. Patient knew of this. Son was somewhat surprised to hear this.  Son is very curious of prognosis. Explained that we certainly do not have a crystal ball but discussed guarded prognosis and risk for decline/dislodgement of clot due to the inability to give blood thinner for PE. Also poor prognosis in general with progression of his melanoma.  Explained that functionally and nutritionally, his father needs to be strong to be a candidate for immunotherapy.   Son becomes tearful and asks if his father will be here a year from now. Frankly and compassionately shared that it would not surprise me if his father was NOT here a year from now. Emotional support provided.   I attempted to elicit values and goals of care important to the patient. Patient and son are adamant with the fact that Johnatan cannot go straight home following hospitalization. Patient is agreeable to short term rehab and  eager to work with rehab. He is hopeful to consider immunotherapy following rehab if he is still deemed a candidate.   Advanced directives, concepts specific to code status, artifical feeding and hydration, and rehospitalization were considered and discussed. Patient does NOT have a documented living will/HCPOA. He is interested in completing this admission. Patient requests son Coolidge Gossard) as primary HCPOA and wife Jamas Lav) as secondary HCPOA. Spiritual consult placed.   Introduced and completed MOST form with patient. His wishes include DNR/DNI, limited additional interventions including CPAP/BiPAP if indicated, IVF/ABX if indicated, and NO feeding tube. Durable DNR completed. MOST form and durable copies given to son and placed in chart. Son understands and respects his father's decisions on MOST form.   Patient is not afraid of dying. He understands high risk for decline in the near future. He speaks of taking life one day at a time and not being afraid of the unknown.  We discussed plan for SNF with outpatient palliative follow-up. Briefly introduced hospice philosophy and options in the event that he does not progress with therapy and not a candidate for immunotherapy. Patient and son confirm understanding.   Questions and concerns were addressed.  Hard Choices booklet left for review. PMT contact information given. Therapeutic listening and emotional support provided.    SUMMARY OF RECOMMENDATIONS    Patient and son understand he is not candidate for anticoagulation. They understand guarded prognosis and high risk for decline in the near future with inability to give anticoagulation for PE and metastatic cancer.  Continue medical management per attending.  Plan is discharge to SNF for rehab. Patient/son agreeable with outpatient palliative follow-up. SW following.  Patient wishes to follow-up with oncology outpatient and consider immunotherapy pending progression at rehab.  MOST form  completed with patient and son. Patient's wishes include: DNR/DNI, limited additional interventions including rehospitalization, CPAP/BiPAP if indicated, IVF/ABX if indicated, NO feeding tube. Durable DNR completed. Copies of MOST and durable DNR given to son and placed in chart.   Spiritual consult for assistance with AD packet. Patient wishes to document HCPOA/living will prior to discharge to SNF.   Code Status/Advance Care Planning:  DNR  Symptom Management:   Per attending  Palliative Prophylaxis:   Aspiration, Bowel Regimen, Delirium Protocol, Frequent Pain Assessment and Oral Care  Psycho-social/Spiritual:   Desire for further Chaplaincy support: yes  Additional Recommendations: Caregiving  Support/Resources, Compassionate Wean Education and Education on Hospice  Prognosis:   Poor long-term prognosis with metastatic melanoma with hemorrhagic brain and right lung mets. Declining functional status. Acute PE and DVT not a candidate for anticoagulation.   Discharge Planning: [facility] for rehab with Palliative care service follow-up      Primary Diagnoses: Present on Admission: . Malignant melanoma of scalp ([place]) . ICH (intracerebral hemorrhage) ([place]) . Hypertension . Brain metastases ([place]) . Left leg DVT ([place]) . Acute pulmonary embolism ([place]) . Mass of right lung . Right adrenal  mass ([place]) . Uncontrolled diabetes mellitus with hyperglycemia ([place])   I have reviewed the medical record, interviewed the patient and family, and examined the patient. The following aspects are pertinent.  Past Medical History:  Diagnosis Date  . Anxiety   . Cancer ([place]) 2017   melanoma on scalp  . History of chicken pox   . Hyperlipidemia   . Hypertension   . Right inguinal hernia    Social History   Socioeconomic History  . Marital status: Married    Spouse name: Not on file  . Number of children: Not on file  . Years of education: Not on file  . Highest  education level: Not on file  Occupational History  . Not on file  Social Needs  . Financial resource strain: Not on file  . Food insecurity    Worry: Not on file    Inability: Not on file  . Transportation needs    Medical: Not on file    Non-medical: Not on file  Tobacco Use  . Smoking status: Never Smoker  . Smokeless tobacco: Never Used  Substance and Sexual Activity  . Alcohol use: No  . Drug use: No    Types: Other-see comments  . Sexual activity: Yes  Lifestyle  . Physical activity    Days per week: Not on file    Minutes per session: Not on file  . Stress: Not on file  Relationships  . Social Herbalist on phone: Not on file    Gets together: Not on file    Attends religious service: Not on file    Active member of club or organization: Not on file    Attends meetings of clubs or organizations: Not on file    Relationship status: Not on file  Other Topics Concern  . Not on file  Social History Narrative  . Not on file   Family History  Problem Relation Age of Onset  . Heart disease Mother    Scheduled Meds: . amLODipine  5 mg Oral Daily  . dexamethasone  4 mg Oral BID  . fluconazole  100 mg Oral Daily  . insulin aspart  0-5 Units Subcutaneous QHS  . insulin aspart  0-9 Units Subcutaneous TID WC  . insulin aspart  4 Units Subcutaneous TID WC  . [START ON 05/16/2019] insulin glargine  20 Units Subcutaneous Daily  . polyethylene glycol  17 g Oral BID  . propranolol  40 mg Oral BID  . sodium chloride flush  3 mL Intravenous Q12H   Continuous Infusions: . azithromycin 500 mg (05/14/19 2057)  . cefTRIAXone (ROCEPHIN)  IV 2 g (05/14/19 1941)   PRN Meds:.acetaminophen **OR** acetaminophen, ALPRAZolam, HYDROcodone-acetaminophen, ondansetron **OR** ondansetron (ZOFRAN) IV, polyethylene glycol Medications Prior to Admission:  Prior to Admission medications   Medication Sig Start Date End Date Taking? Authorizing Provider  acetaminophen (TYLENOL) 325  MG tablet Take 2 tablets (650 mg total) by mouth every 6 (six) hours as needed for mild pain or headache. 04/02/19  Yes Barb Merino, MD  ALPRAZolam Duanne Moron) 0.5 MG tablet Take 0.5 mg by mouth 3 (three) times daily as needed for anxiety.   Yes [provider]  amLODipine (NORVASC) 5 MG tablet Take 1 tablet (5 mg total) by mouth daily. 04/05/19 05/12/19 Yes Dahal, Marlowe Aschoff, MD  cetirizine (ZYRTEC) 10 MG tablet Take 1 tablet (10 mg total) by mouth daily. 06/19/18  Yes Isaac Bliss, Rayford Halsted, MD  dexamethasone (DECADRON) 4 MG tablet  Take 1 tablet (4 mg total) by mouth 2 (two) times daily. For one month, then we will taper to 2 mg BID 05/02/19 06/01/19 Yes Tyler Pita, MD  fluconazole (DIFLUCAN) 100 MG tablet Take 1 tablet (100 mg total) by mouth daily. 05/05/19  Yes Heilingoetter, Cassandra L, PA-C  furosemide (LASIX) 20 MG tablet Take 1 tablet (20 mg total) by mouth daily for 5 days. 05/11/19 05/16/19 Yes Julianne Rice, MD  lisinopril (PRINIVIL,ZESTRIL) 40 MG tablet Take 1 tablet (40 mg total) by mouth daily. 09/19/18  Yes Isaac Bliss, Rayford Halsted, MD  Multiple Vitamins-Minerals (CENTRUM SILVER PO) Take 1 tablet by mouth daily.    Yes [provider]  potassium chloride (KLOR-CON) 10 MEQ tablet Take 1 tablet (10 mEq total) by mouth daily. 05/11/19  Yes Julianne Rice, MD  propranolol (INDERAL) 20 MG tablet Take 40 mg by mouth 2 (two) times daily.   Yes [provider]  triamcinolone (KENALOG) 0.1 % paste Use as directed 1 application in the mouth or throat every 3 (three) hours as needed (mouth sores).  05/07/19  Yes [provider]  vitamin B-12 (CYANOCOBALAMIN) 1000 MCG tablet Take 1 tablet (1,000 mcg total) by mouth daily. 04/02/19  Yes Barb Merino, MD  magic mouthwash w/lidocaine SOLN Take 5 mLs by mouth 4 (four) times daily as needed for mouth pain. Patient not taking: Reported on 05/05/2019 04/24/19   Isaac Bliss, Rayford Halsted, MD  magic mouthwash  w/lidocaine SOLN Swish and swallow 5 ml by mouth 4 times daily. Lidocaine, Maalox, Benadryl Patient not taking: Reported on 05/05/2019 05/02/19   Tyler Pita, MD  magic mouthwash w/lidocaine SOLN Take 5 mLs by mouth 4 (four) times daily as needed for mouth pain (swish, gargle and spit out or swallow, prior to meals and at bedtime). Patient not taking: Reported on 05/10/2019 05/08/19   Isaac Bliss, Rayford Halsted, MD  nystatin (MYCOSTATIN/NYSTOP) powder Apply topically 2 (two) times daily. To groin Patient not taking: Reported on 05/05/2019 04/24/19   Isaac Bliss, Rayford Halsted, MD   No Known Allergies Review of Systems  Constitutional: Positive for activity change.  Neurological: Positive for weakness.   Physical Exam Vitals signs and nursing note reviewed.  Constitutional:      General: He is awake.  HENT:     Head: Normocephalic and atraumatic.  Pulmonary:     Effort: No tachypnea, accessory muscle usage or respiratory distress.  Abdominal:     Tenderness: There is no abdominal tenderness.  Skin:    General: Skin is warm and dry.  Neurological:     Mental Status: He is alert and oriented to person, place, and time.  Psychiatric:        Attention and Perception: Attention normal.        Mood and Affect: Mood normal.        Speech: Speech normal.        Behavior: Behavior normal.        Cognition and Memory: Cognition normal.    Vital Signs: BP 131/78   Pulse 72   Temp 98.6 F (37 C) (Oral)   Resp 18   Ht '5\' 10"'  (1.778 m)   Wt 85.1 kg   SpO2 90%   BMI 26.92 kg/m  Pain Scale: 0-10   Pain Score: 0-No pain   SpO2: SpO2: 90 % O2 Device:SpO2: 90 % O2 Flow Rate: .O2 Flow Rate (L/min): 2 L/min  IO: Intake/output summary:   Intake/Output Summary (Last 24 hours) at 05/15/2019 1721  Last data filed at 05/15/2019 1000 Gross per 24 hour  Intake 1070 ml  Output 800 ml  Net 270 ml    LBM: Last BM Date: 05/12/19 Baseline Weight: Weight: 94.3 kg Most recent weight:  Weight: 85.1 kg     Palliative Assessment/Data: PPS 50%   Flowsheet Rows     Most Recent Value  Intake Tab  Referral Department  Hospitalist  Unit at Time of Referral  Med/Surg Unit  Palliative Care Primary Diagnosis  Cancer  Palliative Care Type  New Palliative care  Reason for referral  Clarify Goals of Care  Date first seen by Palliative Care  05/15/19  Clinical Assessment  Palliative Performance Scale Score  50%  Psychosocial & Spiritual Assessment  Palliative Care Outcomes  Patient/Family meeting held?  Yes  Who was at the meeting?  patient and son  Palliative Care Outcomes  Clarified goals of care, Counseled regarding hospice, Provided end of life care assistance, Provided advance care planning, Provided psychosocial or spiritual support, Completed durable DNR, Linked to palliative care logitudinal support, ACP counseling assistance      Time In: 1340 Time Out: 1510 Time Total: 76mn Greater than 50%  of this time was spent counseling and coordinating care related to the above assessment and plan.  Signed by:   MIhor Dow DNP, FNP-C Palliative Medicine Team  Phone: [phone]: [phone]  Please contact Palliative Medicine Team phone at [phone] questions and concerns.  For individual provider: See AShea Evans

## 2019-05-16 LAB — GLUCOSE, CAPILLARY
Glucose-Capillary: 117 mg/dL — ABNORMAL HIGH (ref 70–99)
Glucose-Capillary: 101 mg/dL — ABNORMAL HIGH (ref 70–99)
Glucose-Capillary: 347 mg/dL — ABNORMAL HIGH (ref 70–99)
Glucose-Capillary: 321 mg/dL — ABNORMAL HIGH (ref 70–99)

## 2019-05-16 LAB — NOVEL CORONAVIRUS, NAA (HOSP ORDER, SEND-OUT TO REF LAB; TAT 18-24 HRS): SARS-CoV-2, NAA: NOT DETECTED

## 2019-05-16 MED ORDER — NYSTATIN 100000 UNIT/ML MT SUSP
5.0000 mL | Freq: Four times a day (QID) | OROMUCOSAL | Status: DC
  Administered 2019-05-16 – 2019-05-17 (×4): 500000 [IU] via ORAL
  Filled 2019-05-16 (×4): qty 5

## 2019-05-16 MED ORDER — SODIUM CHLORIDE 0.9 % IV SOLN: INTRAVENOUS | Status: DC | PRN

## 2019-05-16 MED ORDER — BIOTENE DRY MOUTH MT LIQD
15.0000 mL | OROMUCOSAL | Status: DC | PRN
  Administered 2019-05-16 – 2019-05-17 (×2): 15 mL via OROMUCOSAL
  Filled 2019-05-16 (×2): qty 15

## 2019-05-16 MED ORDER — MAGNESIUM OXIDE 400 (241.3 MG) MG PO TABS
200.0000 mg | ORAL_TABLET | Freq: Two times a day (BID) | ORAL | Status: DC
  Administered 2019-05-16 – 2019-05-17 (×3): 200 mg via ORAL
  Filled 2019-05-16 (×3): qty 1

## 2019-05-16 NOTE — Progress Notes (Signed)
PROGRESS NOTE    Randall Mcgee  [id number] DOB: Jun 21, 1941 DOA: 05/12/2019 PCP: Isaac Bliss, Rayford Halsted, MD   Brief Narrative: 78 year old with past medical history significant for metastatic melanoma with hemorrhagic brain metastases and hypertension now presenting to the emergency department with leg swelling and hyperglycemia on outpatient blood work.  Patient had melanoma resected in 2017, was found to have hemorrhagic brain metastases last month, was treated with radiation and immunotherapy has been taking Decadron and was directed to the ED for evaluation of hyperglycemia on outpatient blood work.  Patient complaining of a swelling to bilateral feet.  Patient reports recent bilateral leg swelling.  He was seen in the emergency department a couple of days ago for these, was given Lasix and reports improvement.  He also had trouble recently with hyperglycemia, likely secondary to Decadron and reports having outpatient blood work that was concerning for hyperglycemia.  Patient denies any chest pain.  He has been short of breath but has not noted any acute changes in this. Evaluation in the ED patient was found to be afebrile, satting in the upper 80s and low 90s.  EKG sinus rhythm with left bundle branch block.  CBC with history of thrombocytopenia, lactic acid 2.9.  CTA chest is concerning for pulmonary emboli right pulmonary artery with possible right heart strain, possible necrotic mass in the right and possible pneumonia in the left upper lobe.     Assessment & Plan:   Principal Problem:   Acute pulmonary embolism (HCC) Active Problems:   Hypertension   Malignant melanoma of scalp (HCC)   ICH (intracerebral hemorrhage) (HCC)   Brain metastases (HCC)   Left leg DVT (HCC)   Mass of right lung   Right adrenal mass (HCC)   Uncontrolled diabetes mellitus with hyperglycemia ([place])   Palliative care by specialist   DNR (do not resuscitate)  1-acute PE, left lower extremity DVT:  -Patient presented with acute PE and lower extremity DVT.  Unable to be a started on anticoagulation due to hemorrhagic brain metastasis and risk for worsening intracerebral bleed. -Case was discussed with neurology, neurosurgery and oncology all who agree the patient is too high risk for blood thinner due to hemorrhagic brain metastasis. -Patient underwent IVC filter placement on 10/21st. -Palliative  has been consulted for further goals of care. Plan to discharge to SNF . He will need palliative care follow up at SNF>  -Echo with normal right ventricular heart strain  2-Metastatic melanoma, hemorrhagic brain metastasis; Side history of metastatic melanoma status post an outpatient surgery in 2017, found to have brain and lung lesions recently underwent radiation and immunotherapy. CT head during this admission showed decrease in size of hemorrhagic brain mets CT chest showed necrotic mass involving the right lung. On Decadron.  Oncology following.  3-Hyperglycemia related to steroids Continue with Lantus.  Continue with a sliding scale insulin. Continue with lantus 20 units. SSI  Hypertension: On Norvasc  Anxiety: Xanax as needed  Pneumonia: Continue with IV antibiotics. Confusion: This could be multifactorial, related to pneumonia infection, prior brain metastasis, hyperglycemia. Headache; report prior episodes since diagnosis of cancer.  Will give vicodin for pain     Estimated body mass index is 27.3 kg/m as calculated from the following:   Height as of this encounter: 5\' 10"  (1.778 m).   Weight as of this encounter: 86.3 kg.   DVT prophylaxis: TED hose Code Status: patient wishes to be DNR, son agrees Family Communication: Son at bedside Disposition Plan: Remain  in the hospital for treatment of pneumonia and need for further discussion of goals of care Consultants:   Oncology  IR  Neurology  Procedures:   IVC placement 10/21  Antimicrobials:  Ceftriaxone   Subjective: Denies dyspnea. Complaining of headache Objective: Vitals:   05/15/19 0847 05/15/19 2020 05/16/19 0500 05/16/19 0526  BP: 131/78 138/76  125/70  Pulse: 72 76  75  Resp:      Temp:  98.5 F (36.9 C)  98.3 F (36.8 C)  TempSrc:  Oral  Oral  SpO2:  94%  90%  Weight:   86.3 kg   Height:        Intake/Output Summary (Last 24 hours) at 05/16/2019 1256 Last data filed at 05/16/2019 1028 Gross per 24 hour  Intake 440 ml  Output 1425 ml  Net -985 ml   Filed Weights   05/12/19 1511 05/15/19 0500 05/16/19 0500  Weight: 94.3 kg 85.1 kg 86.3 kg    Examination:  General exam: NAD Respiratory system: CTA Cardiovascular system: S 1, S 2 RRR Gastrointestinal system: BS present, soft, nt Central nervous system: Alert, non focal.  Extremities: Symmetric power.     Data Reviewed: I have personally reviewed following labs and imaging studies  CBC: Recent Labs  Lab 05/10/19 2013 05/12/19 1537 05/13/19 1132 05/15/19 0832  WBC 9.4 10.1 9.5 9.1  NEUTROABS 7.5 8.3*  --   --   HGB 13.3 14.0 12.5* 13.5  HCT 39.9 41.3 36.3* 38.5*  MCV 100.8* 98.8 97.1 95.8  PLT 129* 112* 103* [id number]*   Basic Metabolic Panel: Recent Labs  Lab 05/10/19 2013 05/12/19 1537 05/15/19 0832  NA 138 138 139  K 4.4 4.8 4.3  CL 102 97* 103  CO2 27 29 28   GLUCOSE 463* 434* 265*  BUN 38* 39* 22  CREATININE 0.98 0.99 0.84  CALCIUM 9.0 9.3 8.7*  MG  --   --  1.9   GFR: Estimated Creatinine Clearance: 72.4 mL/min (by C-G formula based on SCr of 0.84 mg/dL). Liver Function Tests: Recent Labs  Lab 05/10/19 2013 05/12/19 1537  AST 41 48*  ALT 83* 86*  ALKPHOS 43 46  BILITOT 0.6 1.1  PROT 5.5* 5.7*  ALBUMIN 3.0* 2.9*   No results for input(s): LIPASE, AMYLASE in the last 168 hours. No results for input(s): AMMONIA in the last 168 hours. Coagulation Profile: No results for input(s): INR, PROTIME in the last 168 hours. Cardiac Enzymes: No results for input(s): CKTOTAL, CKMB,  CKMBINDEX, TROPONINI in the last 168 hours. BNP (last 3 results) No results for input(s): PROBNP in the last 8760 hours. HbA1C: No results for input(s): HGBA1C in the last 72 hours. CBG: Recent Labs  Lab 05/15/19 1122 05/15/19 1554 05/15/19 2033 05/16/19 0757 05/16/19 1140  GLUCAP 335* 273* 283* 117* 101*   Lipid Profile: No results for input(s): CHOL, HDL, LDLCALC, TRIG, CHOLHDL, LDLDIRECT in the last 72 hours. Thyroid Function Tests: No results for input(s): TSH, T4TOTAL, FREET4, T3FREE, THYROIDAB in the last 72 hours. Anemia Panel: No results for input(s): VITAMINB12, FOLATE, FERRITIN, TIBC, IRON, RETICCTPCT in the last 72 hours. Sepsis Labs: Recent Labs  Lab 05/12/19 1537 05/12/19 1915  LATICACIDVEN 2.9* 2.2*    Recent Results (from the past 240 hour(s))  SARS CORONAVIRUS 2 (TAT 6-24 HRS) Nasopharyngeal Nasopharyngeal Swab     Status: None   Collection Time: 05/12/19  6:43 PM   Specimen: Nasopharyngeal Swab  Result Value Ref Range Status   SARS Coronavirus 2 NEGATIVE NEGATIVE  Final    Comment: (NOTE) SARS-CoV-2 target nucleic acids are NOT DETECTED. The SARS-CoV-2 RNA is generally detectable in upper and lower respiratory specimens during the acute phase of infection. Negative results do not preclude SARS-CoV-2 infection, do not rule out co-infections with other pathogens, and should not be used as the sole basis for treatment or other patient management decisions. Negative results must be combined with clinical observations, patient history, and epidemiological information. The expected result is Negative. Fact Sheet for Patients: SugarRoll.be Fact Sheet for Healthcare Providers: https://www.woods-mathews.com/ This test is not yet approved or cleared by the [place] FDA and  has been authorized for detection and/or diagnosis of SARS-CoV-2 by FDA under an Emergency Use Authorization (EUA). This EUA will remain  in  effect (meaning this test can be used) for the duration of the COVID-19 declaration under Section 56 4(b)(1) of the Act, 21 U.S.C. section 360bbb-3(b)(1), unless the authorization is terminated or revoked sooner. Performed at [facility], [place] [address] [place], [place] [address]          Radiology Studies: No results found.      Scheduled Meds: . amLODipine  5 mg Oral Daily  . dexamethasone  4 mg Oral BID  . fluconazole  100 mg Oral Daily  . insulin aspart  0-5 Units Subcutaneous QHS  . insulin aspart  0-9 Units Subcutaneous TID WC  . insulin aspart  4 Units Subcutaneous TID WC  . insulin glargine  20 Units Subcutaneous Daily  . magnesium oxide  200 mg Oral BID  . polyethylene glycol  17 g Oral BID  . propranolol  40 mg Oral BID  . sodium chloride flush  3 mL Intravenous Q12H   Continuous Infusions: . azithromycin 500 mg (05/15/19 2239)  . cefTRIAXone (ROCEPHIN)  IV 2 g (05/15/19 2040)     LOS: 4 days    Time spent: 35 minutes.     Elmarie Shiley, MD [facility] Pager [phone]  If 7PM-7AM, please contact night-coverage [email or web address] Password [email or web address] 05/16/2019, 12:56 PM

## 2019-05-16 NOTE — Progress Notes (Signed)
Notified by central tele that patient had a 25-beat run of v-tach at 0520.  On assessment, patient is asymptomatic, resting comfortably in no acute distress.  Current rate and rhythm is NSR 77.  [facility] provider Jeannette Corpus, NP notified.

## 2019-05-16 NOTE — Progress Notes (Signed)
Chaplain delivered the paperwork for Advanced Directives.  The patient will review with family after lunchtime and the chaplain will follow-up to have paperwork signed.  [facility]ion [employer] Chaplain Resident For questions concerning this note please contact me by pager [phone]

## 2019-05-17 DIAGNOSIS — R52 Pain, unspecified: Secondary | ICD-10-CM | POA: Diagnosis not present

## 2019-05-17 DIAGNOSIS — C7931 Secondary malignant neoplasm of brain: Secondary | ICD-10-CM | POA: Diagnosis not present

## 2019-05-17 DIAGNOSIS — I82412 Acute embolism and thrombosis of left femoral vein: Secondary | ICD-10-CM | POA: Diagnosis not present

## 2019-05-17 DIAGNOSIS — M2550 Pain in unspecified joint: Secondary | ICD-10-CM | POA: Diagnosis not present

## 2019-05-17 DIAGNOSIS — R5381 Other malaise: Secondary | ICD-10-CM | POA: Diagnosis not present

## 2019-05-17 DIAGNOSIS — I2609 Other pulmonary embolism with acute cor pulmonale: Secondary | ICD-10-CM | POA: Diagnosis not present

## 2019-05-17 DIAGNOSIS — E785 Hyperlipidemia, unspecified: Secondary | ICD-10-CM | POA: Diagnosis not present

## 2019-05-17 DIAGNOSIS — R2681 Unsteadiness on feet: Secondary | ICD-10-CM | POA: Diagnosis not present

## 2019-05-17 DIAGNOSIS — U071 COVID-19: Secondary | ICD-10-CM | POA: Diagnosis not present

## 2019-05-17 DIAGNOSIS — R1311 Dysphagia, oral phase: Secondary | ICD-10-CM | POA: Diagnosis not present

## 2019-05-17 DIAGNOSIS — R2689 Other abnormalities of gait and mobility: Secondary | ICD-10-CM | POA: Diagnosis not present

## 2019-05-17 DIAGNOSIS — J189 Pneumonia, unspecified organism: Secondary | ICD-10-CM | POA: Diagnosis not present

## 2019-05-17 DIAGNOSIS — R279 Unspecified lack of coordination: Secondary | ICD-10-CM | POA: Diagnosis not present

## 2019-05-17 DIAGNOSIS — M6281 Muscle weakness (generalized): Secondary | ICD-10-CM | POA: Diagnosis not present

## 2019-05-17 DIAGNOSIS — C434 Malignant melanoma of scalp and neck: Secondary | ICD-10-CM | POA: Diagnosis not present

## 2019-05-17 DIAGNOSIS — K1370 Unspecified lesions of oral mucosa: Secondary | ICD-10-CM | POA: Diagnosis not present

## 2019-05-17 DIAGNOSIS — N179 Acute kidney failure, unspecified: Secondary | ICD-10-CM | POA: Diagnosis not present

## 2019-05-17 DIAGNOSIS — Z7401 Bed confinement status: Secondary | ICD-10-CM | POA: Diagnosis not present

## 2019-05-17 DIAGNOSIS — E875 Hyperkalemia: Secondary | ICD-10-CM | POA: Diagnosis not present

## 2019-05-17 DIAGNOSIS — I619 Nontraumatic intracerebral hemorrhage, unspecified: Secondary | ICD-10-CM | POA: Diagnosis not present

## 2019-05-17 DIAGNOSIS — E1165 Type 2 diabetes mellitus with hyperglycemia: Secondary | ICD-10-CM | POA: Diagnosis not present

## 2019-05-17 DIAGNOSIS — E099 Drug or chemical induced diabetes mellitus without complications: Secondary | ICD-10-CM | POA: Diagnosis not present

## 2019-05-17 DIAGNOSIS — I69815 Cognitive social or emotional deficit following other cerebrovascular disease: Secondary | ICD-10-CM | POA: Diagnosis not present

## 2019-05-17 DIAGNOSIS — I82402 Acute embolism and thrombosis of unspecified deep veins of left lower extremity: Secondary | ICD-10-CM | POA: Diagnosis not present

## 2019-05-17 DIAGNOSIS — R0902 Hypoxemia: Secondary | ICD-10-CM | POA: Diagnosis not present

## 2019-05-17 DIAGNOSIS — T380X5D Adverse effect of glucocorticoids and synthetic analogues, subsequent encounter: Secondary | ICD-10-CM | POA: Diagnosis not present

## 2019-05-17 DIAGNOSIS — I2699 Other pulmonary embolism without acute cor pulmonale: Secondary | ICD-10-CM | POA: Diagnosis not present

## 2019-05-17 DIAGNOSIS — I10 Essential (primary) hypertension: Secondary | ICD-10-CM | POA: Diagnosis not present

## 2019-05-17 LAB — GLUCOSE, CAPILLARY
Glucose-Capillary: 150 mg/dL — ABNORMAL HIGH (ref 70–99)
Glucose-Capillary: 269 mg/dL — ABNORMAL HIGH (ref 70–99)

## 2019-05-17 MED ORDER — ALPRAZOLAM 0.5 MG PO TABS: 0.5000 mg | ORAL_TABLET | Freq: Three times a day (TID) | ORAL | 0 refills | Status: AC | PRN

## 2019-05-17 MED ORDER — POLYETHYLENE GLYCOL 3350 17 G PO PACK: 17.0000 g | PACK | Freq: Two times a day (BID) | ORAL | 0 refills | Status: AC

## 2019-05-17 MED ORDER — NYSTATIN 100000 UNIT/ML MT SUSP: 5.0000 mL | Freq: Four times a day (QID) | OROMUCOSAL | 0 refills | Status: AC

## 2019-05-17 MED ORDER — BIOTENE DRY MOUTH MT LIQD: 15.0000 mL | OROMUCOSAL | 0 refills | Status: AC | PRN

## 2019-05-17 MED ORDER — INSULIN GLARGINE 100 UNIT/ML ~~LOC~~ SOLN
23.0000 [IU] | Freq: Every day | SUBCUTANEOUS | Status: DC
  Administered 2019-05-17: 23 [IU] via SUBCUTANEOUS
  Filled 2019-05-17: qty 0.23

## 2019-05-17 MED ORDER — HYDROCODONE-ACETAMINOPHEN 5-325 MG PO TABS: 1.0000 | ORAL_TABLET | ORAL | 0 refills | Status: AC | PRN

## 2019-05-17 MED ORDER — INSULIN ASPART 100 UNIT/ML ~~LOC~~ SOLN: 6.0000 [IU] | Freq: Three times a day (TID) | SUBCUTANEOUS | 11 refills | Status: AC

## 2019-05-17 MED ORDER — INSULIN GLARGINE 100 UNIT/ML ~~LOC~~ SOLN: 23.0000 [IU] | Freq: Every day | SUBCUTANEOUS | 11 refills | Status: AC

## 2019-05-17 MED ORDER — INSULIN ASPART 100 UNIT/ML ~~LOC~~ SOLN: 0.0000 [IU] | Freq: Every day | SUBCUTANEOUS | 11 refills | Status: AC

## 2019-05-17 MED ORDER — FUROSEMIDE 20 MG PO TABS: 20.0000 mg | ORAL_TABLET | Freq: Every day | ORAL | 0 refills | Status: AC | PRN

## 2019-05-17 NOTE — Discharge Summary (Signed)
Physician Discharge Summary  Milton Lanier Saathoff [id number] DOB: [birth year] DOA: 05/12/2019  PCP: Isaac Bliss, Rayford Halsted, MD  Admit date: 05/12/2019 Discharge date: 05/17/2019  Admitted From: Home  Disposition: Home   Recommendations for Outpatient Follow-up:  1. Follow up with PCP in 1-2 weeks 2. Please obtain BMP/CBC in one week 3. Monitor CBG adjust regimen as needed. As patient is taper off steroid he will require less insulin. 4. Use lasix as needed for LE edema.  5. Follow up with oncology 6. Please consult palliative care at SNF  Home Health: none  Discharge Condition: Stable.  CODE STATUS: DNR Diet recommendation: Carb Modified  Brief/Interim Summary: 78 year old with past medical history significant for metastatic melanoma with hemorrhagic brain metastases and hypertension now presenting to the emergency department with leg swelling and hyperglycemia on outpatient blood work.  Patient had melanoma resected in 2017, was found to have hemorrhagic brain metastases last month, was treated with radiation and immunotherapy has been taking Decadron and was directed to the ED for evaluation of hyperglycemia on outpatient blood work.  Patient complaining of a swelling to bilateral feet.  Patient reports recent bilateral leg swelling.  He was seen in the emergency department a couple of days ago for these, was given Lasix and reports improvement.  He also had trouble recently with hyperglycemia, likely secondary to Decadron and reports having outpatient blood work that was concerning for hyperglycemia.  Patient denies any chest pain.  He has been short of breath but has not noted any acute changes in this. Evaluation in the ED patient was found to be afebrile, satting in the upper 80s and low 90s.  EKG sinus rhythm with left bundle branch block.  CBC with history of thrombocytopenia, lactic acid 2.9.  CTA chest is concerning for pulmonary emboli right pulmonary artery with possible right  heart strain, possible necrotic mass in the right and possible pneumonia in the left upper lobe.   1-acute PE, left lower extremity DVT: -Patient presented with acute PE and lower extremity DVT.  Unable to be a started on anticoagulation due to hemorrhagic brain metastasis and risk for worsening intracerebral bleed. -Case was discussed with neurology, neurosurgery and oncology all who agree the patient is too high risk for blood thinner due to hemorrhagic brain metastasis. -Patient underwent IVC filter placement on 10/21st. -Palliative  has been consulted for further goals of care. Plan to discharge to SNF . He will need palliative care follow up at SNF>  -Echo with normal right ventricular heart strain  2-Metastatic melanoma, hemorrhagic brain metastasis; Side history of metastatic melanoma status post an outpatient surgery in 2017, found to have brain and lung lesions recently underwent radiation and immunotherapy. CT head during this admission showed decrease in size of hemorrhagic brain mets CT chest showed necrotic mass involving the right lung. On Decadron. Needs to follow up with oncology.   3-Hyperglycemia related to steroids Continue with Lantus.  Continue with a sliding scale insulin. Continue with lantus 23 units. 6 units meals coverage. And Bed time SSI   Hypertension: On Norvasc  Anxiety: Xanax as needed  Pneumonia: Received 5 days of antibiotics.  Confusion: This could be multifactorial, related to pneumonia infection, prior brain metastasis, hyperglycemia. Headache; report prior episodes since diagnosis of cancer. Continue with vicodin PRN. Odynophagia, Mouth pain; continue with Nystatin and Biotene mouth was.     Discharge Diagnoses:  Principal Problem:   Acute pulmonary embolism (HCC) Active Problems:   Hypertension   Malignant melanoma of  scalp ([place])   ICH (intracerebral hemorrhage) ([place])   Brain metastases ([place])   Left leg DVT (HCC)   Mass of right  lung   Right adrenal mass ([place])   Uncontrolled diabetes mellitus with hyperglycemia ([place])   Palliative care by specialist   DNR (do not resuscitate)    Discharge Instructions  Discharge Instructions    Diet Carb Modified   Complete by: As directed    Increase activity slowly   Complete by: As directed      Allergies as of 05/17/2019   No Known Allergies     Medication List    STOP taking these medications   lisinopril 40 MG tablet Commonly known as: ZESTRIL   magic mouthwash w/lidocaine Soln   potassium chloride 10 MEQ tablet Commonly known as: KLOR-CON     TAKE these medications   acetaminophen 325 MG tablet Commonly known as: TYLENOL Take 2 tablets (650 mg total) by mouth every 6 (six) hours as needed for mild pain or headache.   ALPRAZolam 0.5 MG tablet Commonly known as: XANAX Take 1 tablet (0.5 mg total) by mouth 3 (three) times daily as needed for anxiety.   amLODipine 5 MG tablet Commonly known as: NORVASC Take 1 tablet (5 mg total) by mouth daily.   antiseptic oral rinse Liqd 15 mLs by Mouth Rinse route as needed for dry mouth.   CENTRUM SILVER PO Take 1 tablet by mouth daily.   cetirizine 10 MG tablet Commonly known as: ZYRTEC Take 1 tablet (10 mg total) by mouth daily.   dexamethasone 4 MG tablet Commonly known as: DECADRON Take 1 tablet (4 mg total) by mouth 2 (two) times daily. For one month, then we will taper to 2 mg BID   fluconazole 100 MG tablet Commonly known as: DIFLUCAN Take 1 tablet (100 mg total) by mouth daily.   furosemide 20 MG tablet Commonly known as: Lasix Take 1 tablet (20 mg total) by mouth daily as needed for up to 10 days for edema. What changed:   when to take this  reasons to take this   HYDROcodone-acetaminophen 5-325 MG tablet Commonly known as: NORCO/VICODIN Take 1-2 tablets by mouth every 4 (four) hours as needed for moderate pain.   insulin aspart 100 UNIT/ML injection Commonly known as:  novoLOG Inject 6 Units into the skin 3 (three) times daily with meals.   insulin aspart 100 UNIT/ML injection Commonly known as: novoLOG Inject 0-5 Units into the skin at bedtime. Correction coverage: HS scale CBG < 70: Implement Hypoglycemia Standing Orders and refer to Hypoglycemia Standing Orders sidebar report CBG 70 - 120: 0 units CBG 121 - 150: 0 units CBG 151 - 200: 0 units CBG 201 - 250: 2 units CBG 251 - 300: 3 units CBG 301 - 350: 4 units CBG 351 - 400: 5 units CBG > 400 call MD and obtain STAT lab verification   insulin glargine 100 UNIT/ML injection Commonly known as: LANTUS Inject 0.23 mLs (23 Units total) into the skin daily.   nystatin 100000 UNIT/ML suspension Commonly known as: MYCOSTATIN Take 5 mLs (500,000 Units total) by mouth 4 (four) times daily.   nystatin powder Commonly known as: MYCOSTATIN/NYSTOP Apply topically 2 (two) times daily. To groin   polyethylene glycol 17 g packet Commonly known as: MIRALAX / GLYCOLAX Take 17 g by mouth 2 (two) times daily.   propranolol 20 MG tablet Commonly known as: INDERAL Take 40 mg by mouth 2 (two) times daily.   triamcinolone  0.1 % paste Commonly known as: KENALOG Use as directed 1 application in the mouth or throat every 3 (three) hours as needed (mouth sores).   vitamin B-12 1000 MCG tablet Commonly known as: CYANOCOBALAMIN Take 1 tablet (1,000 mcg total) by mouth daily.      Contact information for after-discharge care    Destination    [facility] .   Service: Skilled Nursing Contact information: [address] [place] [phone]             No Known Allergies  Consultations:  Oncology  Neurology  Neurosurgery    Procedures/Studies: Dg Chest 2 View  Result Date: 05/12/2019 CLINICAL DATA:  Weakness, lower extremity edema, history of metastatic cancer EXAM: CHEST - 2 VIEW COMPARISON:  03/25/2019 FINDINGS: The heart size and  mediastinal contours are within normal limits. Unchanged elevation of the left hemidiaphragm without acute appearing airspace opacity. Disc degenerative disease and ankylosis of the thoracic spine. IMPRESSION: Unchanged elevation of the left hemidiaphragm without acute appearing airspace opacity. Electronically Signed   By: Eddie Candle M.D.   On: 05/12/2019 16:59   Ct Head Wo Contrast  Result Date: 05/12/2019 CLINICAL DATA:  Hypoglycemia, pedal edema, history hypertension, melanoma EXAM: CT HEAD WITHOUT CONTRAST TECHNIQUE: Contiguous axial images were obtained from the base of the skull through the vertex without intravenous contrast. Sagittal and coronal MPR images reconstructed from axial data set. COMPARISON:  CT head 03/27/2019 FINDINGS: Brain: Generalized atrophy. Mild ex vacuo dilatation of the ventricular system. Again identified hyperdense focus at the posterior RIGHT parieto-occipital lobe 2.5 x 2.3 cm consistent with intraparenchymal hemorrhage, decreased in size. Persistent residual surrounding edema. Smaller LEFT except a hemorrhage seen on the previous exam has decreased in size as well, now 1.8 x 1.3 cm and demonstrating a fluid/fluid level. Decreased attenuation and size of previously seen LEFT temporal lobe hemorrhage, now 10 x 9 mm. Small vessel chronic ischemic changes of deep cerebral white matter. No new areas of intracranial hemorrhage, mass lesion or infarction. No extra-axial fluid collections. Vascular: Atherosclerotic calcifications of internal carotid arteries at skull base. Skull: Intact Sinuses/Orbits: Clear Other: N/A IMPRESSION: Atrophy with small vessel chronic ischemic changes of deep cerebral white matter. Interval decrease in sizes of previously seen intraparenchymal hemorrhages in both hemispheres at sites of known hemorrhagic metastases. No new intracranial abnormalities. Electronically Signed   By: Lavonia Dana M.D.   On: 05/12/2019 17:13   Ct Angio Chest Pe W/cm &/or Wo  Cm  Result Date: 05/12/2019 CLINICAL DATA:  Bilateral lower extremity swelling. EXAM: CT ANGIOGRAPHY CHEST WITH CONTRAST TECHNIQUE: Multidetector CT imaging of the chest was performed using the standard protocol during bolus administration of intravenous contrast. Multiplanar CT image reconstructions and MIPs were obtained to evaluate the vascular anatomy. CONTRAST:  142mL OMNIPAQUE IOHEXOL 350 MG/ML SOLN COMPARISON:  None. FINDINGS: Cardiovascular: Small filling defects are noted in upper and lower lobe branches of the right pulmonary artery. RV/LV ratio of 1.0 is noted. Normal cardiac size. No pericardial effusion. Atherosclerosis of thoracic aorta is noted without aneurysm or dissection. Mediastinum/Nodes: Thyroid gland and esophagus are unremarkable. Extensive mediastinal adenopathy is noted. This includes 3 cm subcarinal adenopathy, 2.3 cm prevascular adenopathy, 2.3 cm left paratracheal adenopathy and 12 mm right hilar lymph node. 17 mm left supraclavicular lymph node is noted. Lungs/Pleura: No pneumothorax is noted. Left upper lobe atelectasis or infiltrate is noted. 36 x 28 mm probable necrotic mass is noted in the right posterior costophrenic  sulcus. Upper Abdomen: 16 mm right adrenal nodule is noted. Musculoskeletal: No chest wall abnormality. No acute or significant osseous findings. Review of the MIP images confirms the above findings. IMPRESSION: Small filling defects are noted in upper and lower lobe branches of right pulmonary artery consistent with small pulmonary emboli. Positive for acute PE with CT evidence of right heart strain (RV/LV Ratio = 1.0) consistent with at least submassive (intermediate risk) PE. The presence of right heart strain has been associated with an increased risk of morbidity and mortality. Please activate Code PE by paging [phone]. 3.6 x 2.8 cm probable necrotic mass is noted in the right posterior costophrenic sulcus of the right lung concerning for malignancy. Right  hilar, left supraclavicular, left paratracheal, subcarinal and prevascular adenopathy is noted concerning for metastatic disease. Critical Value/emergent results were called by telephone at the time of interpretation on 05/12/2019 at 7:02 pm to providerRACHEL LITTLE , who verbally acknowledged these results. Mild left upper lobe atelectasis or infiltrate is noted. 16 mm right adrenal nodule is noted. MRI is recommended to rule out metastatic disease. Aortic Atherosclerosis (ICD10-I70.0). Electronically Signed   By: Marijo Conception M.D.   On: 05/12/2019 19:02   Mr Jeri Cos [id number] Contrast  Result Date: 04/17/2019 CLINICAL DATA:  Metastatic melanoma. Preoperative planning for stereotactic radio surgery. EXAM: MRI HEAD WITHOUT AND WITH CONTRAST TECHNIQUE: Multiplanar, multiecho pulse sequences of the brain and surrounding structures were obtained without and with intravenous contrast. CONTRAST:  44mL MULTIHANCE GADOBENATE DIMEGLUMINE 529 MG/ML IV SOLN COMPARISON:  MRI head 03/26/2019 FINDINGS: Brain: Multiple enhancing lesions the brain compatible with widespread metastatic disease. These lesions show susceptibility compatible with prior hemorrhage/melanin content. Lesions are marked on the axial postcontrast images in PACS. 2 mm area of restricted diffusion right cerebellum without enhancement. This was not seen previously and could represent a small acute infarct versus early metastatic disease. 10 mm right occipital lesion with surrounding edema Punctate 2 mm enhancing lesion right occipital lesion with surrounding edema unchanged 20 mm cystic mass left occipital lobe with surrounding edema Complex hemorrhagic lesion right occipital parietal lobe measuring 43 x 37 mm similar to prior. Surrounding edema and local mass-effect. 11 mm left lateral temporal lesion 64mm enhancing lesion right frontal lobe Punctate 1.5 mm lesion right parietal cortex axial image 133 Punctate 1.5 x 2 mm enhancing lesion left frontal cortex  axial image 140 Ventricle size normal.  Mild atrophy.  No midline shift. Vascular: Normal arterial flow voids. Skull and upper cervical spine: No skull lesion. Left frontal scalp resection over the convexity. Sinuses/Orbits: Mild mucosal edema paranasal sinuses. Bilateral cataract surgery Other: None IMPRESSION: At least 9 metastatic deposits in the brain as described above. Most of these show evidence of prior hemorrhage or melanin deposition due to metastatic melanoma. Largest lesion right occipital parietal lobe with surrounding edema and local mass-effect. New punctate area of restricted diffusion right cerebellum without enhancement. This could represent acute infarct due to hypercoagulability or possibly early metastatic disease. Electronically Signed   By: Franchot Gallo M.D.   On: 04/17/2019 16:08   Ir Ivc Filter Plmt / S&i /img Guid/mod Sed  Result Date: 05/14/2019 INDICATION: 78 year old male with a history of metastatic melanoma including hemorrhagic brain metastases. He has new acute DVT in the left lower extremity. His history of hemorrhagic brain Mets represents an absolute contraindication to anticoagulation. Therefore, he is a candidate for caval interruption as a form of PE prophylaxis. EXAM: ULTRASOUND GUIDANCE FOR VASCULARACCESS IVC CATHETERIZATION AND VENOGRAM  IVC FILTER INSERTION Interventional Radiologist:  Criselda Peaches, MD MEDICATIONS: None. ANESTHESIA/SEDATION: Fentanyl 75 mcg IV; Versed 1.5 mg IV Moderate Sedation Time:  13 minutes The patient was continuously monitored during the procedure by the interventional radiology nurse under my direct supervision. FLUOROSCOPY TIME:  Fluoroscopy Time: 0 minutes 48 seconds (52 mGy). COMPLICATIONS: None immediate. PROCEDURE: Informed written consent was obtained from the patient after a thorough discussion of the procedural risks, benefits and alternatives. All questions were addressed. Maximal Sterile Barrier Technique was utilized  including caps, mask, sterile gowns, sterile gloves, sterile drape, hand hygiene and skin antiseptic. A timeout was performed prior to the initiation of the procedure. Maximal barrier sterile technique utilized including caps, mask, sterile gowns, sterile gloves, large sterile drape, hand hygiene, and Betadine prep. Under sterile condition and local anesthesia, right internal jugular venous access was performed with ultrasound. An ultrasound image was saved and sent to PACS. Over a guidewire, the IVC filter delivery sheath and inner dilator were advanced into the IVC just above the IVC bifurcation. Contrast injection was performed for an IVC venogram. Through the delivery sheath, a retrievable Denali IVC filter was deployed below the level of the renal veins and above the IVC bifurcation. Limited post deployment venacavagram was performed. The delivery sheath was removed and hemostasis was obtained with manual compression. A dressing was placed. The patient tolerated the procedure well without immediate post procedural complication. FINDINGS: The IVC is patent. No evidence of thrombus, stenosis, or occlusion. No variant venous anatomy. Successful placement of the IVC filter below the level of the renal veins. IMPRESSION: Successful ultrasound and fluoroscopically guided placement of an infrarenal retrievable IVC filter via right jugular approach. PLAN: Due to patient related comorbidities and/or clinical necessity, this IVC filter should be considered a permanent device. This patient will not be actively followed for future filter retrieval. Electronically Signed   By: Jacqulynn Cadet M.D.   On: 05/14/2019 12:42   Vas [place] Lower Extremity Venous (dvt) (mc And Wl 7a-7p)  Result Date: 05/13/2019  Lower Venous Study Indications: Swelling.  Comparison Study: No prior study. Performing Technologist: Maudry Mayhew MHA, RDMS, RVT, RDCS  Examination Guidelines: A complete evaluation includes B-mode imaging,  spectral Doppler, color Doppler, and power Doppler as needed of all accessible portions of each vessel. Bilateral testing is considered an integral part of a complete examination. Limited examinations for reoccurring indications may be performed as noted.  +---------+---------------+---------+-----------+----------+--------------+ RIGHT    CompressibilityPhasicitySpontaneityPropertiesThrombus Aging +---------+---------------+---------+-----------+----------+--------------+ CFV      Full           Yes      Yes                                 +---------+---------------+---------+-----------+----------+--------------+ SFJ      Full                                                        +---------+---------------+---------+-----------+----------+--------------+ FV Prox  Full                                                        +---------+---------------+---------+-----------+----------+--------------+  FV Mid   Full                                                        +---------+---------------+---------+-----------+----------+--------------+ FV DistalFull                                                        +---------+---------------+---------+-----------+----------+--------------+ PFV      Full                                                        +---------+---------------+---------+-----------+----------+--------------+ POP      Full           Yes      Yes                                 +---------+---------------+---------+-----------+----------+--------------+ PTV      Full                                                        +---------+---------------+---------+-----------+----------+--------------+ PERO     Full                                                        +---------+---------------+---------+-----------+----------+--------------+  +---------+---------------+---------+-----------+----------+-----------------+ LEFT      CompressibilityPhasicitySpontaneityPropertiesThrombus Aging    +---------+---------------+---------+-----------+----------+-----------------+ CFV      Full           Yes      Yes                                    +---------+---------------+---------+-----------+----------+-----------------+ SFJ      Full                                                           +---------+---------------+---------+-----------+----------+-----------------+ FV Prox  Full                                                           +---------+---------------+---------+-----------+----------+-----------------+ FV Mid   None                    No  Acute             +---------+---------------+---------+-----------+----------+-----------------+ FV DistalNone                    No                   Acute             +---------+---------------+---------+-----------+----------+-----------------+ PFV      Full                                                           +---------+---------------+---------+-----------+----------+-----------------+ POP      Partial                 Yes                  Age Indeterminate +---------+---------------+---------+-----------+----------+-----------------+ PTV      None                    No                   Acute             +---------+---------------+---------+-----------+----------+-----------------+ PERO     Full                                                           +---------+---------------+---------+-----------+----------+-----------------+  Summary: Right: There is no evidence of deep vein thrombosis in the lower extremity. No cystic structure found in the popliteal fossa. Left: Findings consistent with acute deep vein thrombosis involving the left femoral vein, and left posterior tibial veins. Findings consistent with age indeterminate deep vein thrombosis involving the left popliteal vein. No cystic structure  found in the popliteal fossa.  *See table(s) above for measurements and observations. Electronically signed by Ruta Hinds MD on 05/13/2019 at 11:16:04 AM.    Final      Subjective: Report no headaches, vicodin help. Mouth wash help with mouth pain. Sore throat is better,   Discharge Exam: Vitals:   05/17/19 0538 05/17/19 0852  BP: 137/83 135/79  Pulse: 74 77  Resp:  18  Temp: 97.8 F (36.6 C)   SpO2: 95% 93%     General: Pt is alert, awake, not in acute distress Cardiovascular: RRR, S1/S2 +, no rubs, no gallops Respiratory: CTA bilaterally, no wheezing, no rhonchi Abdominal: Soft, NT, ND, bowel sounds + Extremities: no edema, no cyanosis    The results of significant diagnostics from this hospitalization (including imaging, microbiology, ancillary and laboratory) are listed below for reference.     Microbiology: Recent Results (from the past 240 hour(s))  SARS CORONAVIRUS 2 (TAT 6-24 HRS) Nasopharyngeal Nasopharyngeal Swab     Status: None   Collection Time: 05/12/19  6:43 PM   Specimen: Nasopharyngeal Swab  Result Value Ref Range Status   SARS Coronavirus 2 NEGATIVE NEGATIVE Final    Comment: (NOTE) SARS-CoV-2 target nucleic acids are NOT DETECTED. The SARS-CoV-2 RNA is generally detectable in upper and lower respiratory specimens during the acute phase of infection. Negative results do not preclude SARS-CoV-2 infection, do not rule  out co-infections with other pathogens, and should not be used as the sole basis for treatment or other patient management decisions. Negative results must be combined with clinical observations, patient history, and epidemiological information. The expected result is Negative. Fact Sheet for Patients: SugarRoll.be Fact Sheet for Healthcare Providers: https://www.woods-mathews.com/ This test is not yet approved or cleared by the [place] FDA and  has been authorized for detection and/or  diagnosis of SARS-CoV-2 by FDA under an Emergency Use Authorization (EUA). This EUA will remain  in effect (meaning this test can be used) for the duration of the COVID-19 declaration under Section 56 4(b)(1) of the Act, 21 U.S.C. section 360bbb-3(b)(1), unless the authorization is terminated or revoked sooner. Performed at [facility], [place] [address], [place], [place] [address]   Novel Coronavirus, NAA (Hosp order, Send-out to Ref Lab; TAT 18-24 hrs     Status: None   Collection Time: 05/15/19  4:24 PM   Specimen: Nasopharyngeal Swab; Respiratory  Result Value Ref Range Status   SARS-CoV-2, NAA NOT DETECTED NOT DETECTED Final    Comment: (NOTE) This nucleic acid amplification test was developed and its performance characteristics determined by [employer]. Nucleic acid amplification tests include PCR and TMA. This test has not been FDA cleared or approved. This test has been authorized by FDA under an Emergency Use Authorization (EUA). This test is only authorized for the duration of time the declaration that circumstances exist justifying the authorization of the emergency use of in vitro diagnostic tests for detection of SARS-CoV-2 virus and/or diagnosis of COVID-19 infection under section 564(b)(1) of the Act, 21 U.S.C. [id number]) (1), unless the authorization is terminated or revoked sooner. When diagnostic testing is negative, the possibility of a false negative result should be considered in the context of a patient's recent exposures and the presence of clinical signs and symptoms consistent with COVID-19. An individual without symptoms of COVID- 19 and who is not shedding SARS-CoV-2 vi rus would expect to have a negative (not detected) result in this assay. Performed At: [facility] [address] [place], [place] [id number] Rush Farmer MD [id number]    [place]  Final    Comment: Performed at [facility], [place] [address], [place], [place] [address]     Labs: BNP (last 3 results) Recent Labs    05/10/19 2013 05/12/19 1537  BNP 128.4* 123456*   Basic Metabolic Panel: Recent Labs  Lab 05/10/19 2013 05/12/19 1537 05/15/19 0832  NA 138 138 139  K 4.4 4.8 4.3  CL 102 97* 103  CO2 27 29 28   GLUCOSE 463* 434* 265*  BUN 38* 39* 22  CREATININE 0.98 0.99 0.84  CALCIUM 9.0 9.3 8.7*  MG  --   --  1.9   Liver Function Tests: Recent Labs  Lab 05/10/19 2013 05/12/19 1537  AST 41 48*  ALT 83* 86*  ALKPHOS 43 46  BILITOT 0.6 1.1  PROT 5.5* 5.7*  ALBUMIN 3.0* 2.9*   No results for input(s): LIPASE, AMYLASE in the last 168 hours. No results for input(s): AMMONIA in the last 168 hours. CBC: Recent Labs  Lab 05/10/19 2013 05/12/19 1537 05/13/19 1132 05/15/19 0832  WBC 9.4 10.1 9.5 9.1  NEUTROABS 7.5 8.3*  --   --   HGB 13.3 14.0 12.5* 13.5  HCT 39.9 41.3 36.3* 38.5*  MCV 100.8* 98.8 97.1 95.8  PLT 129* 112* 103* 103*   Cardiac Enzymes: No results for input(s): CKTOTAL, CKMB,  CKMBINDEX, TROPONINI in the last 168 hours. BNP: Invalid input(s): POCBNP CBG: Recent Labs  Lab 05/16/19 0757 05/16/19 1140 05/16/19 1701 05/16/19 2142 05/17/19 0804  GLUCAP 117* 101* 347* 321* 150*   D-Dimer No results for input(s): DDIMER in the last 72 hours. Hgb A1c No results for input(s): HGBA1C in the last 72 hours. Lipid Profile No results for input(s): CHOL, HDL, LDLCALC, TRIG, CHOLHDL, LDLDIRECT in the last 72 hours. Thyroid function studies No results for input(s): TSH, T4TOTAL, T3FREE, THYROIDAB in the last 72 hours.  Invalid input(s): FREET3 Anemia work up No results for input(s): VITAMINB12, FOLATE, FERRITIN, TIBC, IRON, RETICCTPCT in the last 72 hours. Urinalysis    Component Value Date/Time   COLORURINE YELLOW 05/12/2019 1736   APPEARANCEUR CLEAR 05/12/2019 1736   LABSPEC 1.020 05/12/2019 1736   PHURINE 5.0 05/12/2019 1736   GLUCOSEU >=500 (A) 05/12/2019 1736   HGBUR  SMALL (A) 05/12/2019 1736   BILIRUBINUR NEGATIVE 05/12/2019 1736   BILIRUBINUR n 07/20/2015 0931   KETONESUR NEGATIVE 05/12/2019 1736   PROTEINUR NEGATIVE 05/12/2019 1736   UROBILINOGEN 0.2 07/20/2015 0931   UROBILINOGEN 1.0 06/13/2011 1201   NITRITE NEGATIVE 05/12/2019 1736   LEUKOCYTESUR NEGATIVE 05/12/2019 1736   Sepsis Labs Invalid input(s): PROCALCITONIN,  WBC,  LACTICIDVEN Microbiology Recent Results (from the past 240 hour(s))  SARS CORONAVIRUS 2 (TAT 6-24 HRS) Nasopharyngeal Nasopharyngeal Swab     Status: None   Collection Time: 05/12/19  6:43 PM   Specimen: Nasopharyngeal Swab  Result Value Ref Range Status   SARS Coronavirus 2 NEGATIVE NEGATIVE Final    Comment: (NOTE) SARS-CoV-2 target nucleic acids are NOT DETECTED. The SARS-CoV-2 RNA is generally detectable in upper and lower respiratory specimens during the acute phase of infection. Negative results do not preclude SARS-CoV-2 infection, do not rule out co-infections with other pathogens, and should not be used as the sole basis for treatment or other patient management decisions. Negative results must be combined with clinical observations, patient history, and epidemiological information. The expected result is Negative. Fact Sheet for Patients: SugarRoll.be Fact Sheet for Healthcare Providers: https://www.woods-mathews.com/ This test is not yet approved or cleared by the [place] FDA and  has been authorized for detection and/or diagnosis of SARS-CoV-2 by FDA under an Emergency Use Authorization (EUA). This EUA will remain  in effect (meaning this test can be used) for the duration of the COVID-19 declaration under Section 56 4(b)(1) of the Act, 21 U.S.C. section 360bbb-3(b)(1), unless the authorization is terminated or revoked sooner. Performed at [facility], [place] [address], [place], [place] [address]   Novel Coronavirus, NAA (Hosp order, Send-out to  Ref Lab; TAT 18-24 hrs     Status: None   Collection Time: 05/15/19  4:24 PM   Specimen: Nasopharyngeal Swab; Respiratory  Result Value Ref Range Status   SARS-CoV-2, NAA NOT DETECTED NOT DETECTED Final    Comment: (NOTE) This nucleic acid amplification test was developed and its performance characteristics determined by [employer]. Nucleic acid amplification tests include PCR and TMA. This test has not been FDA cleared or approved. This test has been authorized by FDA under an Emergency Use Authorization (EUA). This test is only authorized for the duration of time the declaration that circumstances exist justifying the authorization of the emergency use of in vitro diagnostic tests for detection of SARS-CoV-2 virus and/or diagnosis of COVID-19 infection under section 564(b)(1) of the Act, 21 U.S.C. [id number]) (1), unless the authorization is terminated or revoked sooner. When diagnostic  testing is negative, the possibility of a false negative result should be considered in the context of a patient's recent exposures and the presence of clinical signs and symptoms consistent with COVID-19. An individual without symptoms of COVID- 19 and who is not shedding SARS-CoV-2 vi rus would expect to have a negative (not detected) result in this assay. Performed At: [facility] [address] [place], [place] [id number] Rush Farmer MD [id number]    [place]  Final    Comment: Performed at [facility], [place] [address], [place], [place] [address]     Time coordinating discharge: 40 minutes  SIGNED:   Elmarie Shiley, MD  [facility]

## 2019-05-17 NOTE — Progress Notes (Signed)
Randall Mcgee to be D/C'd to [name] per MD order. Report called to [name]   VSS, Skin clean, dry and intact without evidence of skin break down, no evidence of skin tears noted. IV catheter discontinued intact. Site without signs and symptoms of complications. Dressing and pressure applied.  An After Visit Summary was printed and given to the patient. Patient received prescription.  D/c education completed with patient/family including follow up instructions, medication list, d/c activities limitations if indicated, with other d/c instructions as indicated by MD - patient able to verbalize understanding, all questions fully answered.   Patient instructed to return to ED, call 911, or call MD for any changes in condition.   Patient escorted via strecher, and D/C to [name]  Dene Gentry 05/17/2019 4:21 PM

## 2019-05-17 NOTE — TOC Transition Note (Signed)
Transition of Care [facility]) - CM/SW Discharge Note   Patient Details  Name: Randall Mcgee MRN: [id number] Date of Birth: [birth year]  Transition of [facility]) CM/SW Contact:  Gabrielle Dare Phone Number: 05/17/2019, 10:33 AM   Clinical Narrative:     Patient will [name] ([place] Anticipated DC Date: May 17, 2019 Family Notified:yes, Krisean Craver [phone] Transport By: Corey Harold   Per MD patient ready for DC to [name] at 3:00pm . RN, patient, patient's family, and facility notified of DC. Assessment, Fl2/Pasrr, and Discharge Summary sent to facility. RN given number for report [phone], Room 905B). DC packet on chart. Ambulance transport requested for patient for 3:00pm.   CSW signing off.  Reed Breech LCSWA [phone]    Final next level of care: Skilled Nursing Facility Barriers to Discharge: No Barriers Identified   Patient Goals and CMS Choice Patient states their goals for this hospitalization and ongoing recovery are:: snf with his wife CMS Medicare.gov Compare Post Acute Care list provided to:: Patient Represenative (must comment)(Daaiel Jr-son) Choice offered to / list presented to : Adult Children  Discharge Placement              Patient chooses bed at: [facility] Patient to be transferred to facility by: [place] Name of family member notified: Jake Samples Patient and family notified of of transfer: 05/17/19  Discharge Plan and Services In-house Referral: Clinical Social Work Discharge Planning Services: CM Consult            DME Arranged: N/A DME Agency: NA       HH Arranged: NA HH Agency: NA        Social Determinants of Health ([place]) Interventions     Readmission Risk Interventions No flowsheet data found.

## 2019-05-19 DIAGNOSIS — T380X5D Adverse effect of glucocorticoids and synthetic analogues, subsequent encounter: Secondary | ICD-10-CM | POA: Diagnosis not present

## 2019-05-19 DIAGNOSIS — E099 Drug or chemical induced diabetes mellitus without complications: Secondary | ICD-10-CM | POA: Diagnosis not present

## 2019-05-19 DIAGNOSIS — I2609 Other pulmonary embolism with acute cor pulmonale: Secondary | ICD-10-CM | POA: Diagnosis not present

## 2019-05-19 DIAGNOSIS — I82412 Acute embolism and thrombosis of left femoral vein: Secondary | ICD-10-CM | POA: Diagnosis not present

## 2019-05-20 ENCOUNTER — Inpatient Hospital Stay: Payer: Medicare Other

## 2019-05-20 ENCOUNTER — Telehealth: Payer: Self-pay | Admitting: Medical Oncology

## 2019-05-20 NOTE — Telephone Encounter (Signed)
HIPAA-Randall Mcgee is on HIPAA (03/14/17),  Son does not know what to tell her because his father told him not to tell Randall Mcgee that  "he is terminal".  "She notice changes in my dad -they share the same room. She is asking me why Randall Mcgee is behaving abnormally, ( hallucination, losing train of thought,aggravated)".   I told son that wife can call here and we can answer her questions. He said she was going to call.

## 2019-05-21 ENCOUNTER — Inpatient Hospital Stay: Payer: Medicare Other

## 2019-05-21 DIAGNOSIS — E875 Hyperkalemia: Secondary | ICD-10-CM | POA: Diagnosis not present

## 2019-05-21 DIAGNOSIS — I82412 Acute embolism and thrombosis of left femoral vein: Secondary | ICD-10-CM | POA: Diagnosis not present

## 2019-05-21 DIAGNOSIS — I2609 Other pulmonary embolism with acute cor pulmonale: Secondary | ICD-10-CM | POA: Diagnosis not present

## 2019-05-21 DIAGNOSIS — R52 Pain, unspecified: Secondary | ICD-10-CM | POA: Diagnosis not present

## 2019-05-26 DIAGNOSIS — J189 Pneumonia, unspecified organism: Secondary | ICD-10-CM | POA: Diagnosis not present

## 2019-05-26 DIAGNOSIS — N179 Acute kidney failure, unspecified: Secondary | ICD-10-CM | POA: Diagnosis not present

## 2019-05-26 DIAGNOSIS — R0902 Hypoxemia: Secondary | ICD-10-CM | POA: Diagnosis not present

## 2019-05-26 DIAGNOSIS — I2609 Other pulmonary embolism with acute cor pulmonale: Secondary | ICD-10-CM | POA: Diagnosis not present

## 2019-06-03 ENCOUNTER — Telehealth: Payer: Self-pay | Admitting: Medical Oncology

## 2019-06-03 ENCOUNTER — Other Ambulatory Visit: Payer: Self-pay | Admitting: Internal Medicine

## 2019-06-03 ENCOUNTER — Encounter: Payer: Self-pay | Admitting: Internal Medicine

## 2019-06-03 NOTE — Progress Notes (Signed)
Pt's wife returned my call and informed me that Mr. Sines passed away.  I gave my condolences and transferred her to Dr. Worthy Flank nurse so she can inform them.

## 2019-06-03 NOTE — Telephone Encounter (Signed)
Randall Mcgee died nov 2nd. appts cancelled.

## 2019-06-03 NOTE — Progress Notes (Signed)
Called pt to introduce myself as his Arboriculturist and to discuss copay assistance for [employer] as well as the [employer].  I left a msg requesting he return my call if he's interested in apply for copay assistance.

## 2019-06-04 ENCOUNTER — Ambulatory Visit: Payer: Medicare Other

## 2019-06-04 ENCOUNTER — Other Ambulatory Visit: Payer: Medicare Other

## 2019-06-04 ENCOUNTER — Ambulatory Visit: Payer: Medicare Other | Admitting: Internal Medicine

## 2019-06-06 NOTE — Progress Notes (Signed)
  Radiation Oncology         ([phone]) [phone] ________________________________  Name: Randall Mcgee MRN: [id number]  Date: 05/02/2019  DOB: March 26, 1941  End of Treatment Note  Diagnosis:   78 y.o. gentleman with metastatic melanoma with multiple brain metastases     Indication for treatment:  Palliative       Radiation treatment dates:   04/25/2019 - 05/02/2019  Site/dose:    1. Brain, PTV1 (right parietal) / 18 Gy in 2 fractions 2. Brain, PTV2-9 / 20 Gy in 1 fraction  Beams/energy:   SRS-VMAT / 6X-FFF  Narrative: The patient tolerated radiation treatment relatively well. No acute side effects were noted.  Plan: The patient has completed radiation treatment. The plan was for the patient to return to radiation oncology clinic for routine followup in one month. I advised him to call or return sooner if he has any questions or concerns related to his recovery or treatment.  Addendum: Patient passed away on 2019-05-27. ________________________________  Sheral Apley. Tammi Klippel, M.D.   This document serves as a record of services personally performed by Tyler Pita, MD. It was created on his behalf by Wilburn Mylar, a trained medical scribe. The creation of this record is based on the scribe's personal observations and the provider's statements to them. This document has been checked and approved by the attending provider.

## 2019-06-11 ENCOUNTER — Ambulatory Visit: Payer: Self-pay | Admitting: Urology

## 2019-06-17 ENCOUNTER — Ambulatory Visit: Payer: Medicare Other | Admitting: Podiatry

## 2019-06-24 DEATH — deceased

## 2019-06-25 ENCOUNTER — Ambulatory Visit: Payer: Medicare Other

## 2019-06-25 ENCOUNTER — Other Ambulatory Visit: Payer: Medicare Other

## 2020-01-15 IMAGING — CT CT ANGIO CHEST
2 of 6 series · 18 of 36 positions shown · IV contrast (omnipaque)
Comparison: None.

CLINICAL DATA: Bilateral lower extremity swelling.

EXAM:
CT ANGIOGRAPHY CHEST WITH CONTRAST
TECHNIQUE: Multidetector CT imaging of the chest was performed using the
standard protocol during bolus administration of intravenous
contrast. Multiplanar CT image reconstructions and MIPs were
obtained to evaluate the vascular anatomy.
CONTRAST:  100mL OMNIPAQUE IOHEXOL 350 MG/ML SOLN

[Series 7: thins · axial · 0.71mm/px · z∈[+1496,+1736]mm · 17 of 268 slices shown]
[im 14/268  lung]
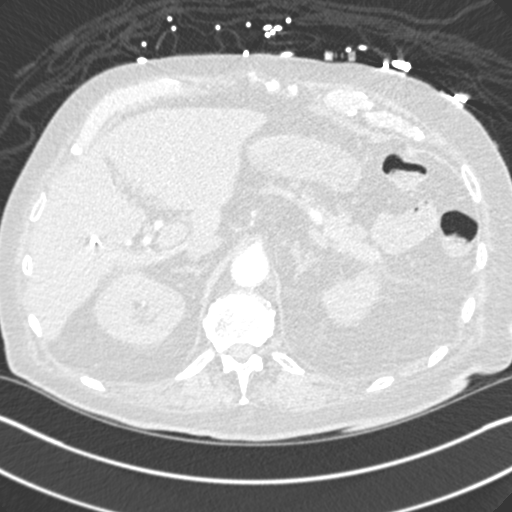
[im 27/268  mediastinal]
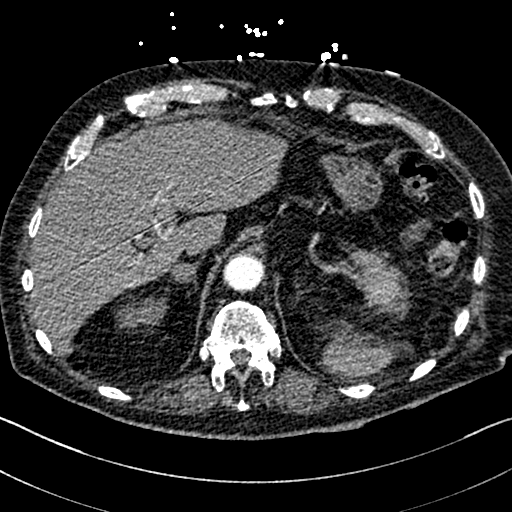
[im 41/268  lung]
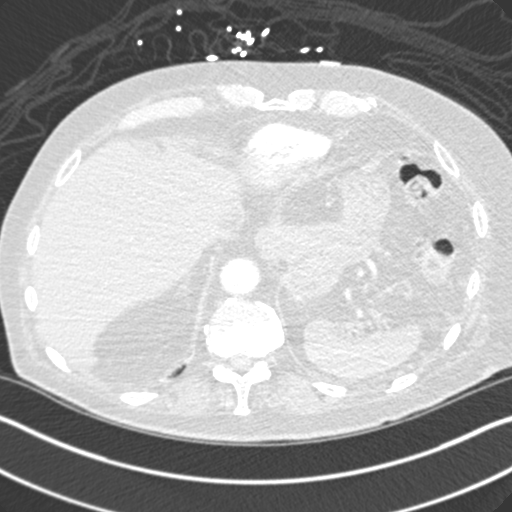
[im 54/268  mediastinal]
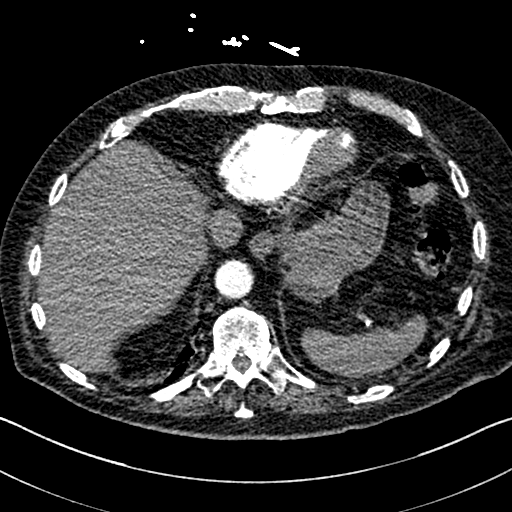
[im 81/268  lung]
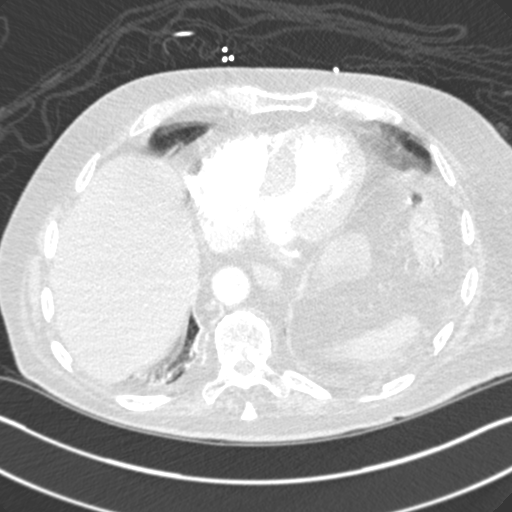
[im 94/268  mediastinal]
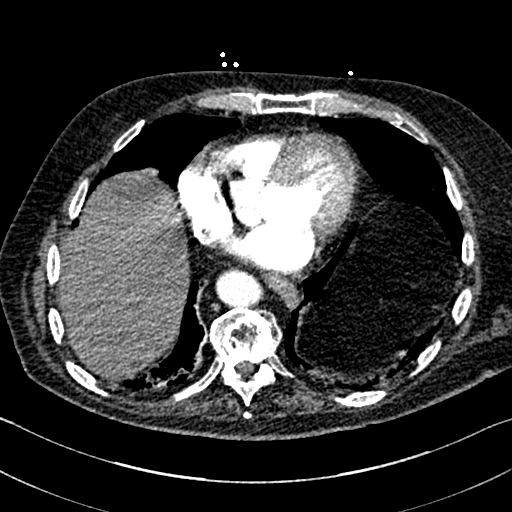
[im 107/268  lung]
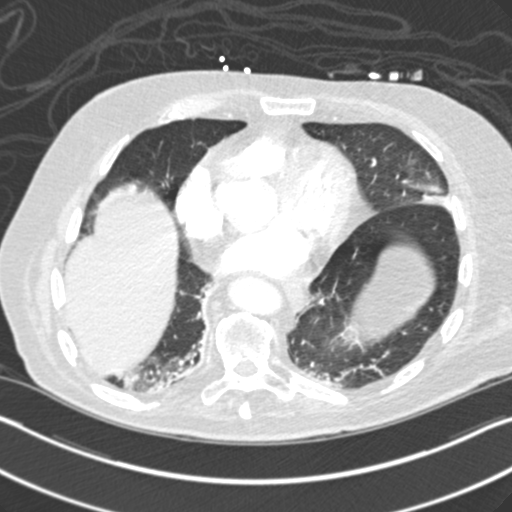
[im 121/268  mediastinal]
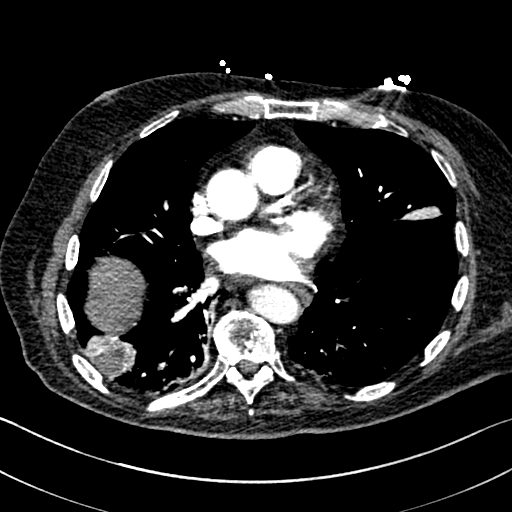
[im 134/268  lung]
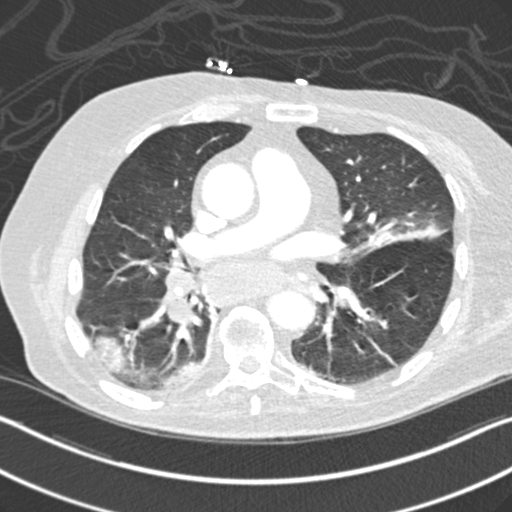
[im 147/268  mediastinal]
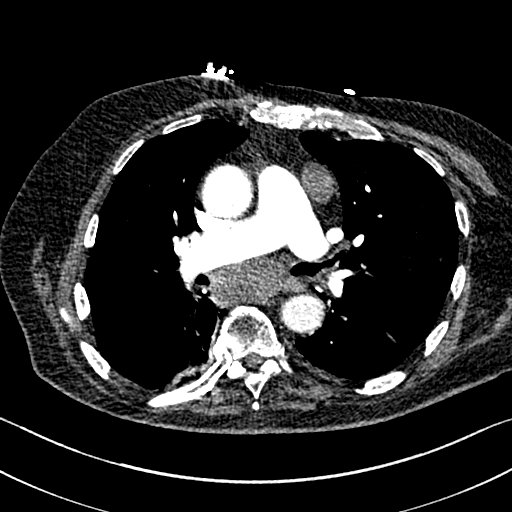
[im 161/268  lung]
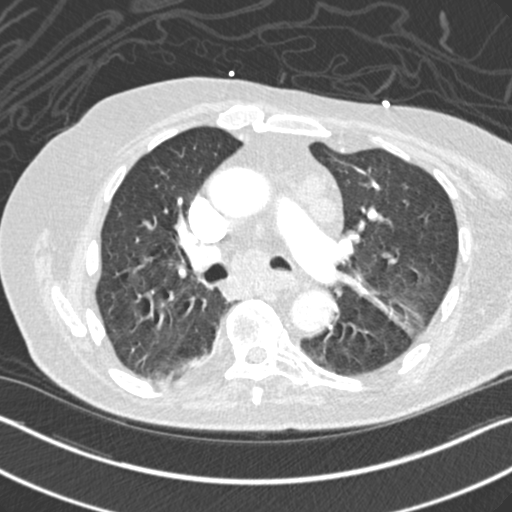
[im 174/268  mediastinal]
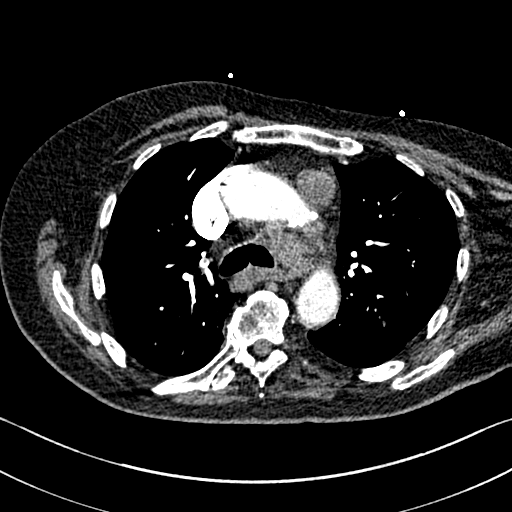
[im 187/268  lung]
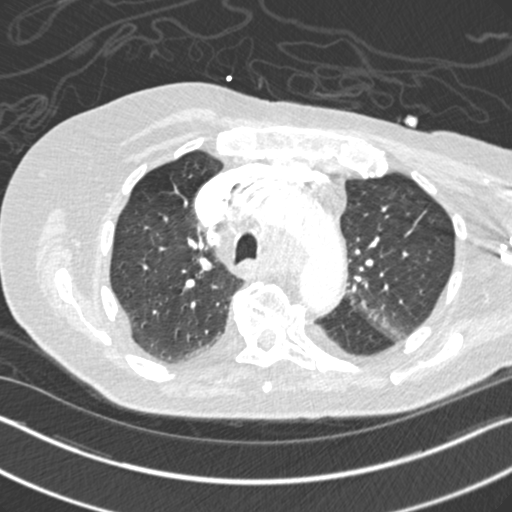
[im 214/268  mediastinal]
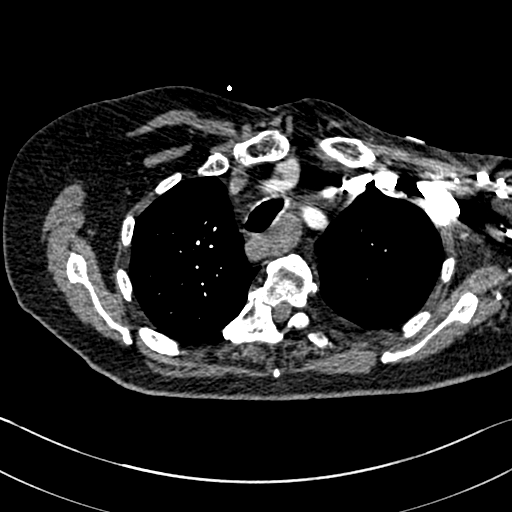
[im 227/268  lung]
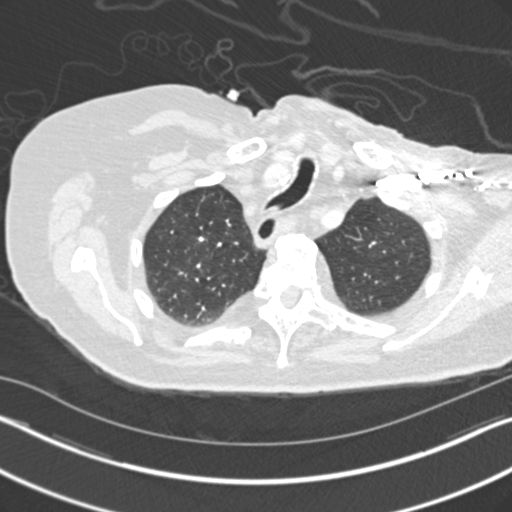
[im 241/268  mediastinal]
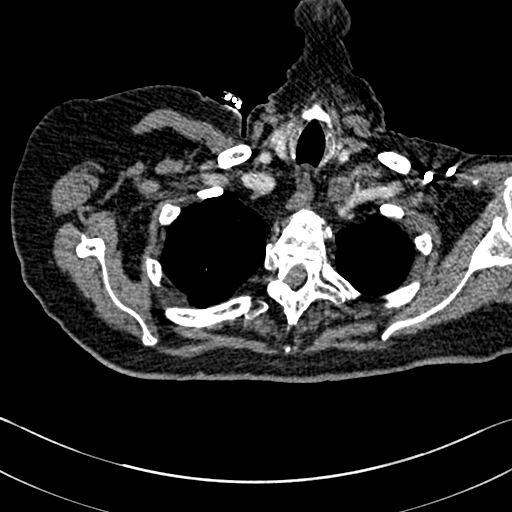
[im 254/268  lung]
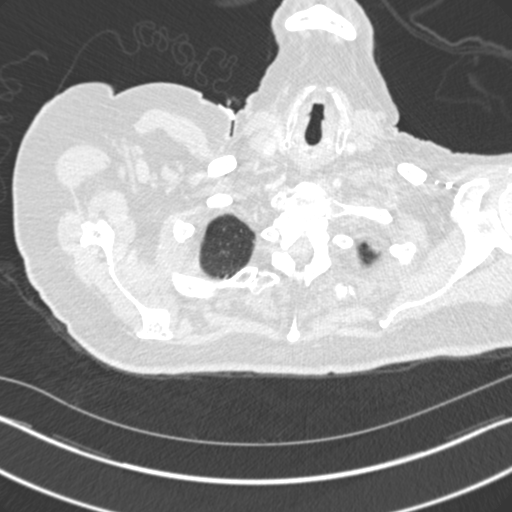

[Series 9: coronal mpr · coronal · 0.57mm/px · 1 of 131 slices shown]
[im 66/131  mediastinal]
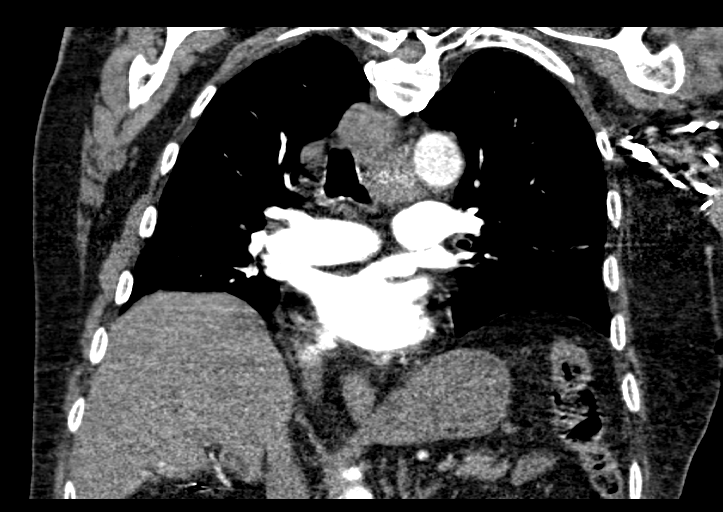

[18 of 36 positions shown; findings below may reference images not displayed]

FINDINGS: Cardiovascular: Small filling defects are noted in upper and lower
lobe branches of the right pulmonary artery. RV/LV ratio of 1.0 is
noted. Normal cardiac size. No pericardial effusion. Atherosclerosis
of thoracic aorta is noted without aneurysm or dissection.

Mediastinum/Nodes: Thyroid gland and esophagus are unremarkable.
Extensive mediastinal adenopathy is noted. This includes 3 cm
subcarinal adenopathy, 2.3 cm prevascular adenopathy, 2.3 cm left
paratracheal adenopathy and 12 mm right hilar lymph node. 17 mm left
supraclavicular lymph node is noted.

Lungs/Pleura: No pneumothorax is noted. Left upper lobe atelectasis
or infiltrate is noted. 36 x 28 mm probable necrotic mass is noted
in the right posterior costophrenic sulcus.

Upper Abdomen: 16 mm right adrenal nodule is noted.

Musculoskeletal: No chest wall abnormality. No acute or significant
osseous findings.

Review of the MIP images confirms the above findings.
IMPRESSION: Small filling defects are noted in upper and lower lobe branches of
right pulmonary artery consistent with small pulmonary emboli.
Positive for acute PE with CT evidence of right heart strain (RV/LV
Ratio = 1.0) consistent with at least submassive (intermediate risk)
PE. The presence of right heart strain has been associated with an
increased risk of morbidity and mortality. Please activate Code PE
by paging [phone].

3.6 x 2.8 cm probable necrotic mass is noted in the right posterior
costophrenic sulcus of the right lung concerning for malignancy.
Right hilar, left supraclavicular, left paratracheal, subcarinal and
prevascular adenopathy is noted concerning for metastatic disease.

Critical Value/emergent results were called by telephone at the time
of interpretation on 05/12/2019 at [DATE] to provider[REDACTED]
, who verbally acknowledged these results.

Mild left upper lobe atelectasis or infiltrate is noted.

16 mm right adrenal nodule is noted. MRI is recommended to rule out
metastatic disease.

Aortic Atherosclerosis ([id number].A).

## 2020-01-17 IMAGING — XA IR IVC FILTER PLMT / S&I /IMG GUID/MOD SED
2 series · 14 of 24 positions shown · IV contrast (IODINE)
Comparison: none

INDICATION: 78-year-old male with a history of metastatic melanoma including
hemorrhagic brain metastases. He has new acute DVT in the left lower
extremity. His history of hemorrhagic brain Mets represents an
absolute contraindication to anticoagulation. Therefore, he is a
candidate for caval interruption as a form of PE prophylaxis.

[Series 1: body 4 care · 8 of 19 slices shown (1 of 2)]
[im 1/19]
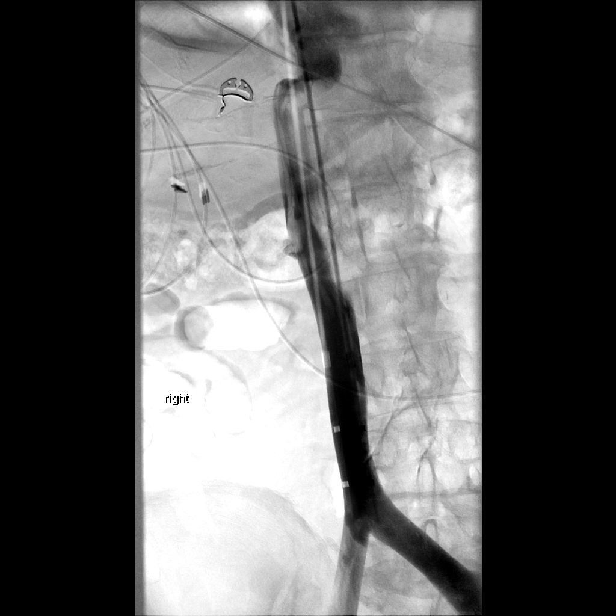
[im 3/19]
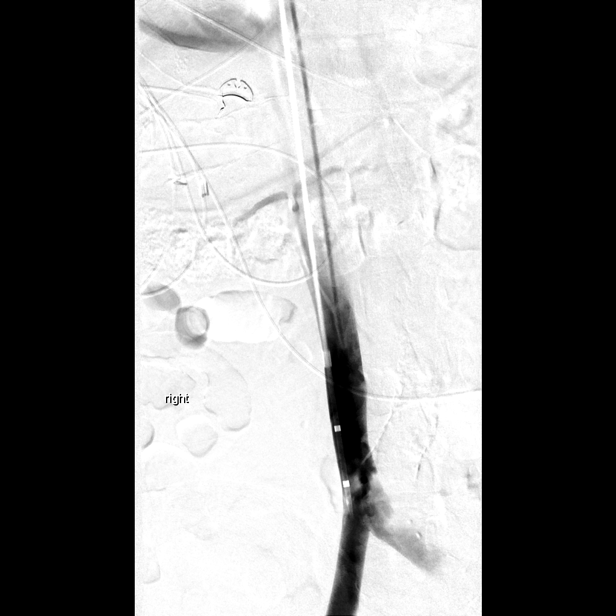
[im 6/19]
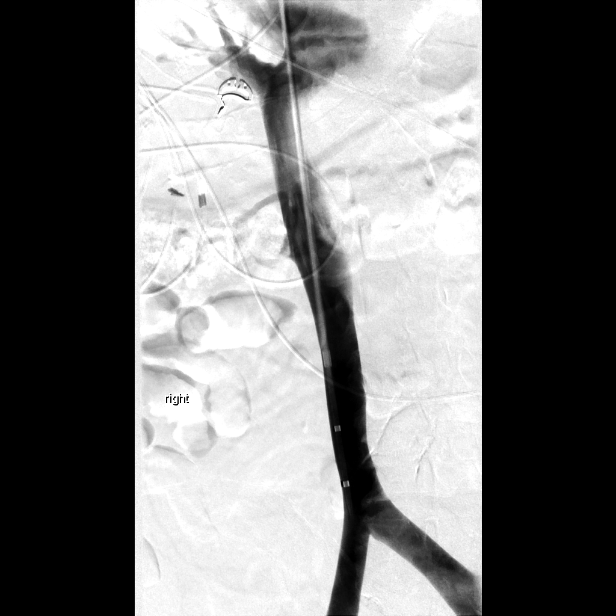
[im 9/19]
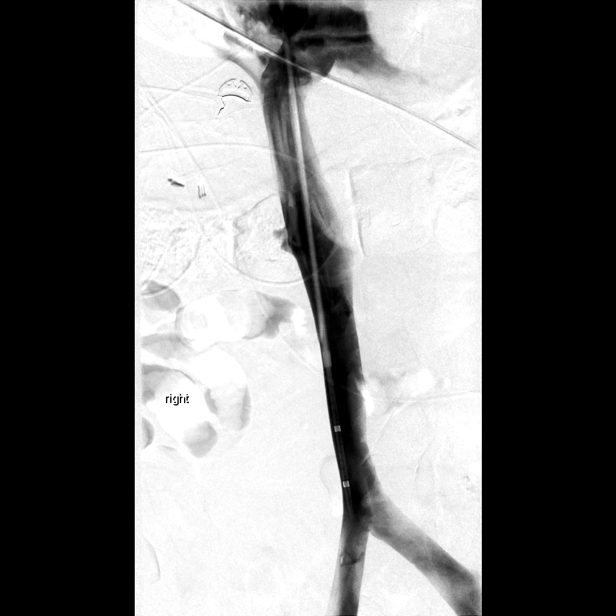
[im 10/19]
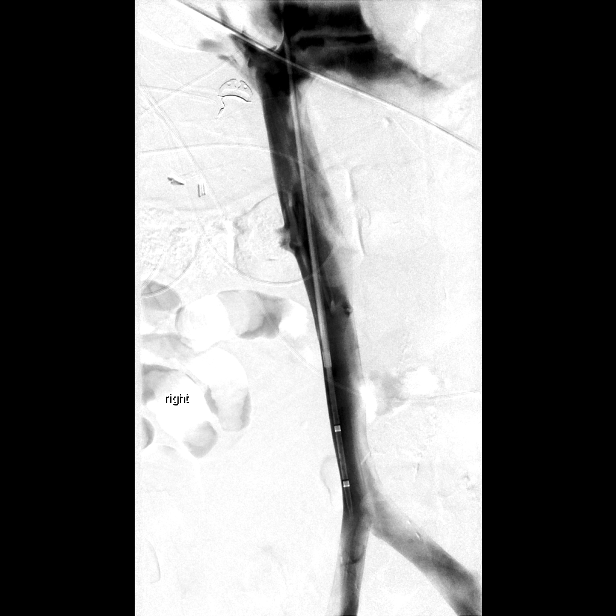
[im 13/19]
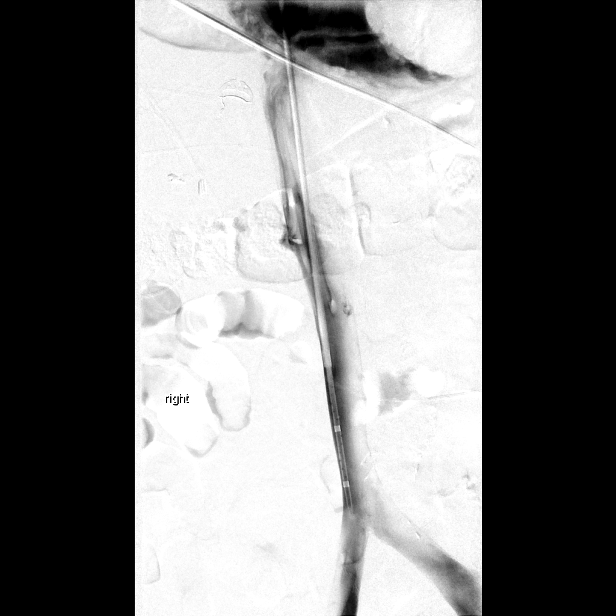
[im 16/19]
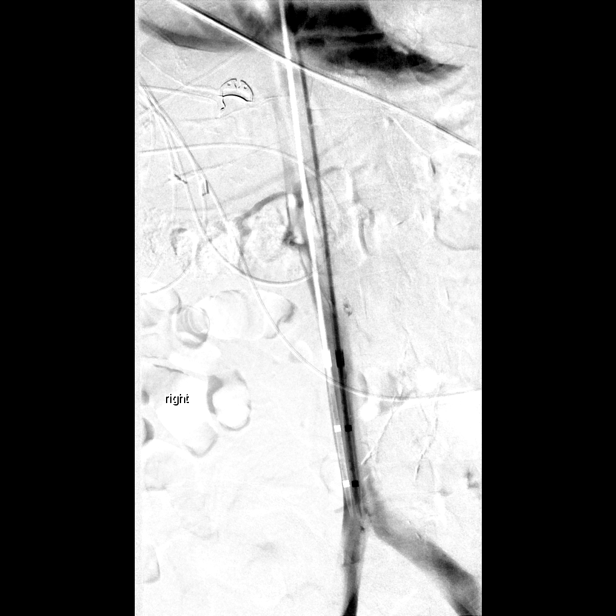
[im 17/19]
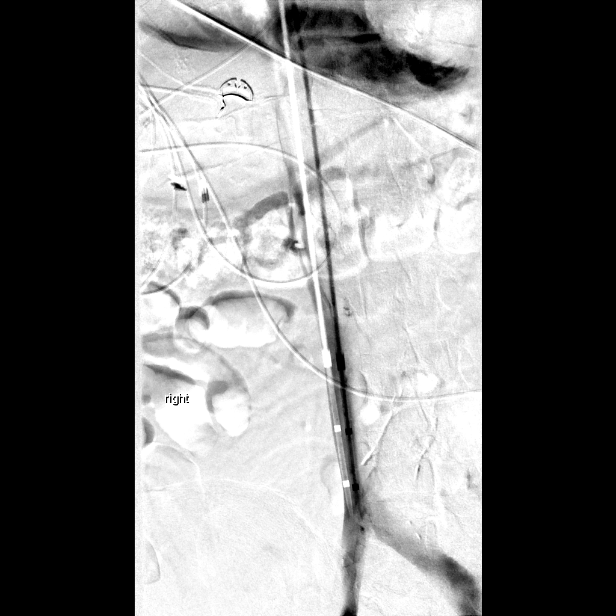

[Series 2: body 4 care · 6 of 13 slices shown (2 of 2)]
[im 1/13]
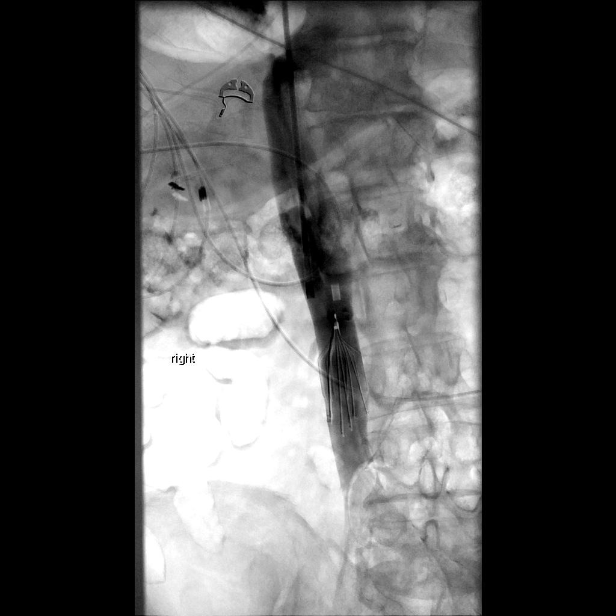
[im 3/13]
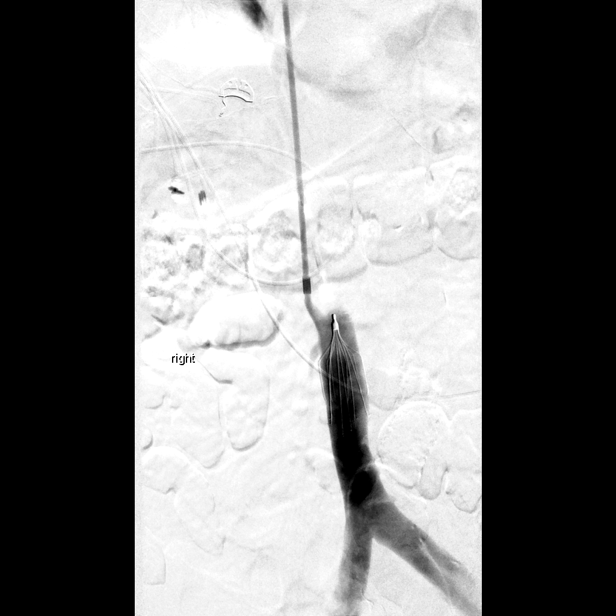
[im 6/13]
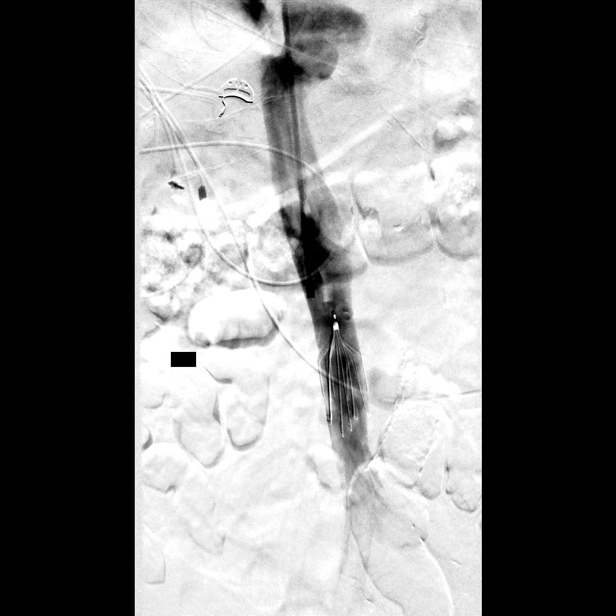
[im 7/13]
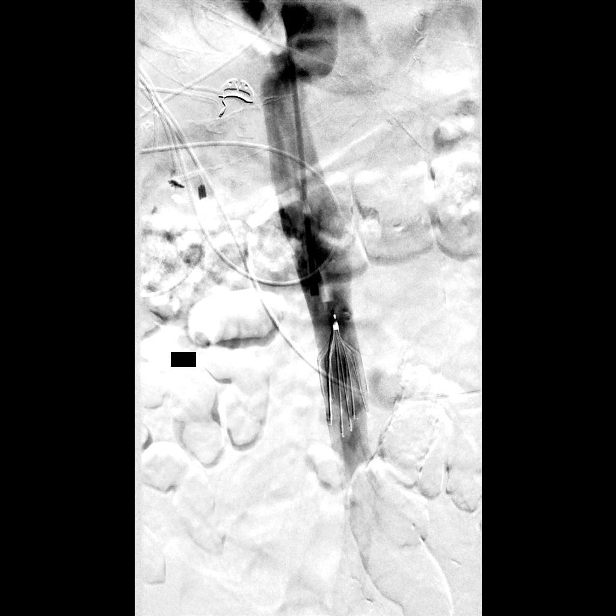
[im 10/13]
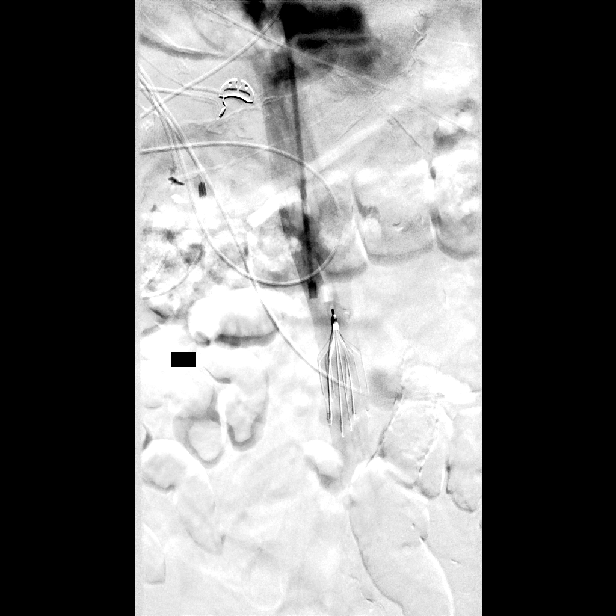
[im 13/13]
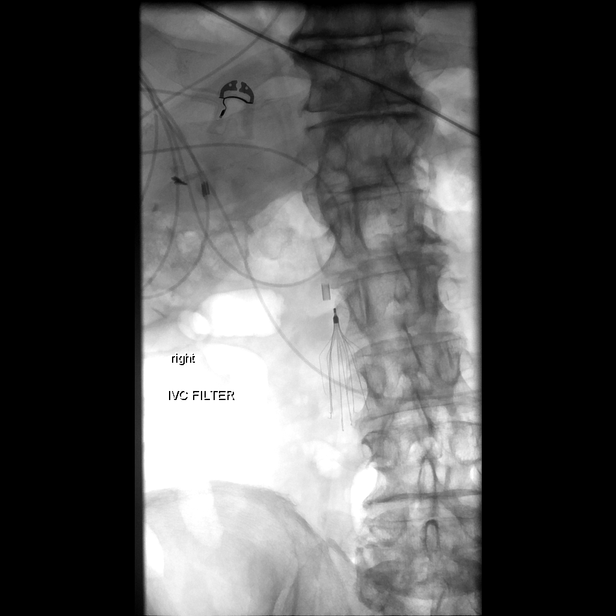

[14 of 24 positions shown; findings below may reference images not displayed]

EXAM:
ULTRASOUND GUIDANCE FOR VASCULARACCESS

IVC CATHETERIZATION AND VENOGRAM

IVC FILTER INSERTION

MEDICATIONS:
None.

ANESTHESIA/SEDATION:
Fentanyl 75 mcg IV; Versed 1.5 mg IV

Moderate Sedation Time:  13 minutes

The patient was continuously monitored during the procedure by the
interventional radiology nurse under my direct supervision.

FLUOROSCOPY TIME:  Fluoroscopy Time: 0 minutes 48 seconds (52 mGy).

COMPLICATIONS:
None immediate.

PROCEDURE:
Informed written consent was obtained from the patient after a
thorough discussion of the procedural risks, benefits and
alternatives. All questions were addressed. Maximal Sterile Barrier
Technique was utilized including caps, mask, sterile gowns, sterile
gloves, sterile drape, hand hygiene and skin antiseptic. A timeout
was performed prior to the initiation of the procedure.

Maximal barrier sterile technique utilized including caps, mask,
sterile gowns, sterile gloves, large sterile drape, hand hygiene,
and Betadine prep.

Under sterile condition and local anesthesia, right internal jugular
venous access was performed with ultrasound. An ultrasound image was
saved and sent to PACS. Over a guidewire, the IVC filter delivery
sheath and inner dilator were advanced into the IVC just above the
IVC bifurcation. Contrast injection was performed for an IVC
venogram.

Through the delivery sheath, a retrievable Denali IVC filter was
deployed below the level of the renal veins and above the IVC
bifurcation. Limited post deployment venacavagram was performed.

The delivery sheath was removed and hemostasis was obtained with
manual compression. A dressing was placed. The patient tolerated the
procedure well without immediate post procedural complication.
FINDINGS: The IVC is patent. No evidence of thrombus, stenosis, or occlusion.
No variant venous anatomy. Successful placement of the IVC filter
below the level of the renal veins.
IMPRESSION: Successful ultrasound and fluoroscopically guided placement of an
infrarenal retrievable IVC filter via right jugular approach.

PLAN:
Due to patient related comorbidities and/or clinical necessity, this
IVC filter should be considered a permanent device. This patient
will not be actively followed for future filter retrieval.

## 2023-06-14 NOTE — Telephone Encounter (Signed)
Telephone call
